# Patient Record
Sex: Female | Born: 1960 | Race: Black or African American | Hispanic: No | Marital: Single | Smoking: Never smoker
Health system: Southern US, Community
[De-identification: ages and names within clinical notes are randomized; demographics above are authoritative.]

## PROBLEM LIST (undated history)

## (undated) DIAGNOSIS — E785 Hyperlipidemia, unspecified: Secondary | ICD-10-CM

## (undated) DIAGNOSIS — Z8669 Personal history of other diseases of the nervous system and sense organs: Secondary | ICD-10-CM

## (undated) DIAGNOSIS — I10 Essential (primary) hypertension: Secondary | ICD-10-CM

## (undated) DIAGNOSIS — K219 Gastro-esophageal reflux disease without esophagitis: Secondary | ICD-10-CM

## (undated) DIAGNOSIS — E119 Type 2 diabetes mellitus without complications: Secondary | ICD-10-CM

## (undated) DIAGNOSIS — G510 Bell's palsy: Secondary | ICD-10-CM

## (undated) HISTORY — PX: ABDOMINAL HYSTERECTOMY: SHX81

## (undated) HISTORY — DX: Type 2 diabetes mellitus without complications: E11.9

## (undated) HISTORY — DX: Gastro-esophageal reflux disease without esophagitis: K21.9

## (undated) HISTORY — DX: Bell's palsy: G51.0

## (undated) HISTORY — DX: Personal history of other diseases of the nervous system and sense organs: Z86.69

## (undated) HISTORY — DX: Hyperlipidemia, unspecified: E78.5

## (undated) HISTORY — DX: Essential (primary) hypertension: I10

## (undated) MED FILL — Medication: Fill #0 | Status: CN

---

## 2003-01-22 HISTORY — PX: OOPHORECTOMY: SHX86

## 2004-04-14 ENCOUNTER — Emergency Department: Payer: Self-pay | Admitting: General Practice

## 2004-04-14 IMAGING — US US PELV - US TRANSVAGINAL
1 series · 17 of 25 positions shown · non-contrast
Comparison: none

REASON FOR EXAM: Abdominal pain. Rm 15
COMMENTS:

[Series 1: us pelv - us transvaginal · 17 of 59 slices shown]
[im 1/59]
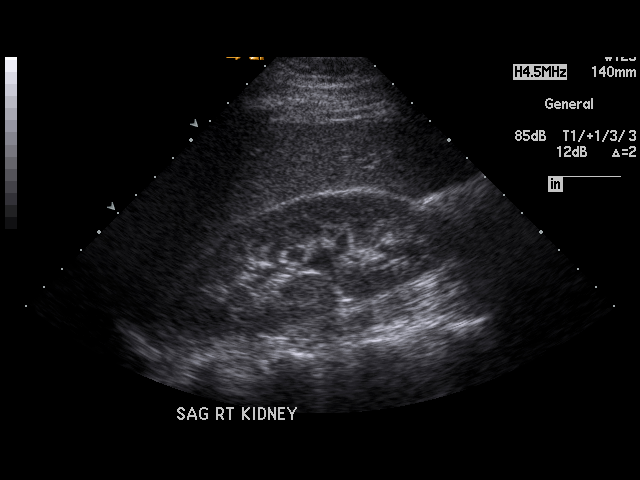
[im 5/59]
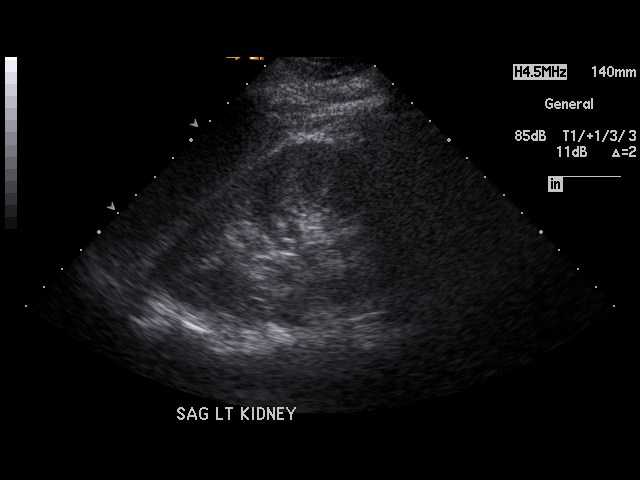
[im 8/59]
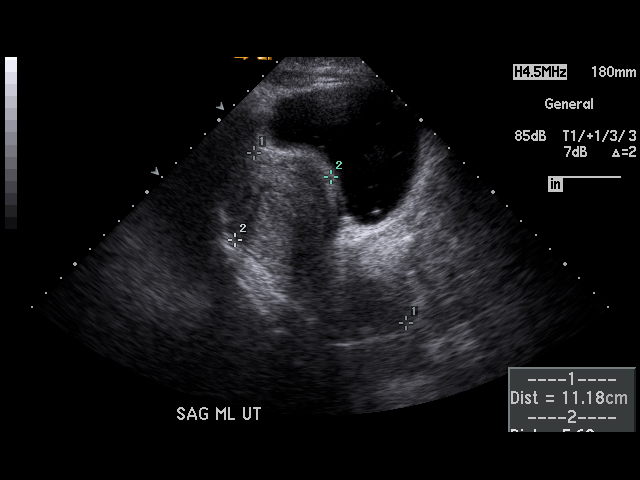
[im 13/59]
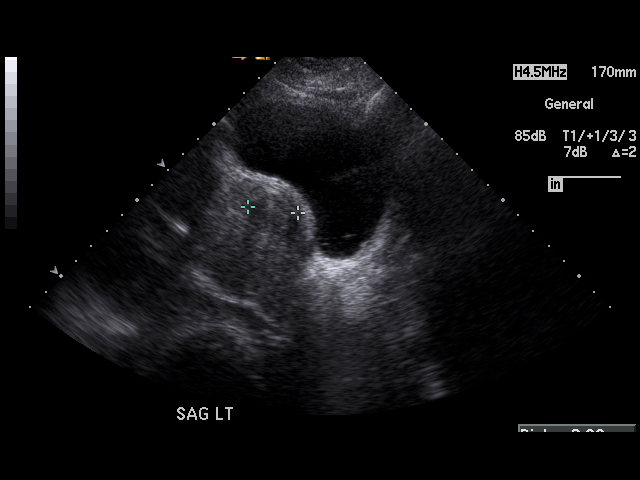
[im 15/59]
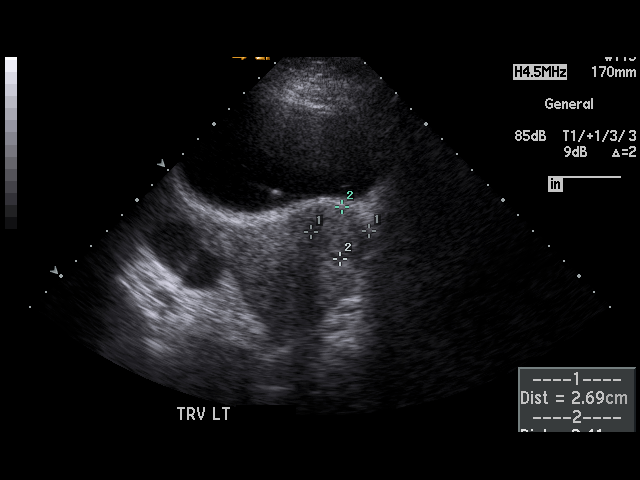
[im 20/59]
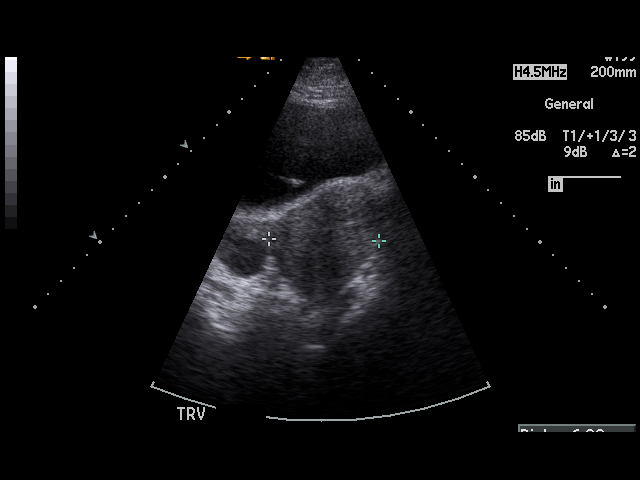
[im 22/59]
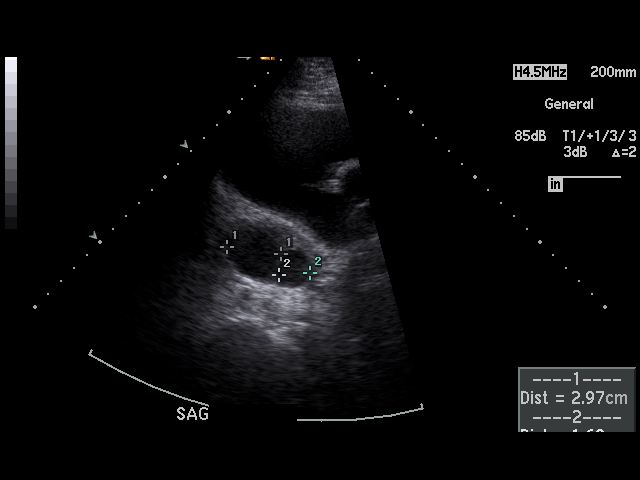
[im 27/59]
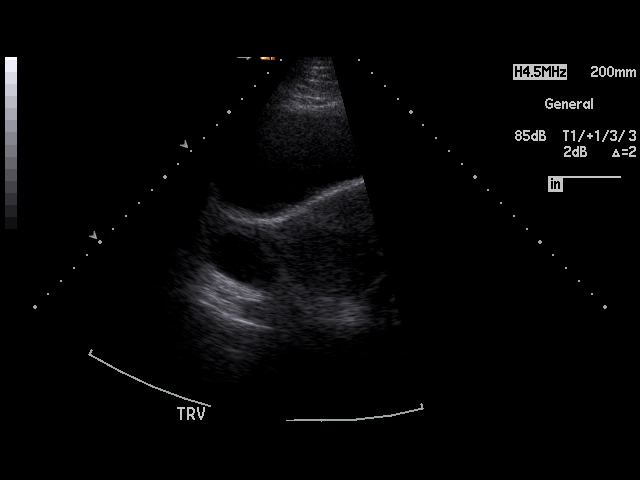
[im 30/59]
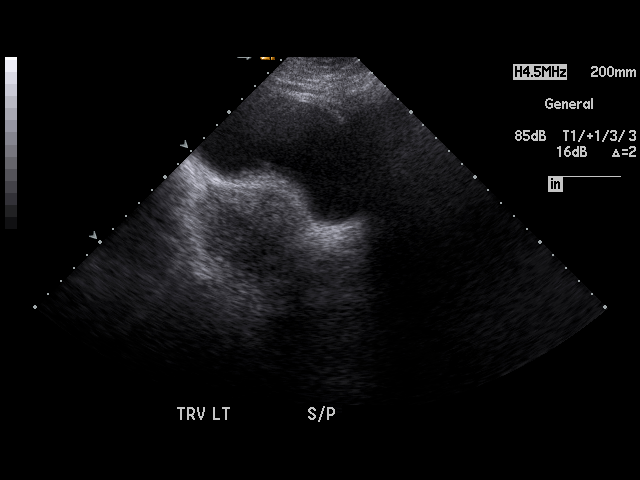
[im 32/59]
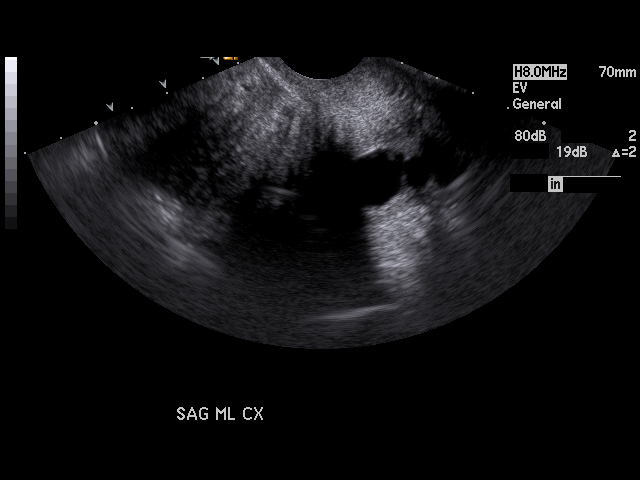
[im 37/59]
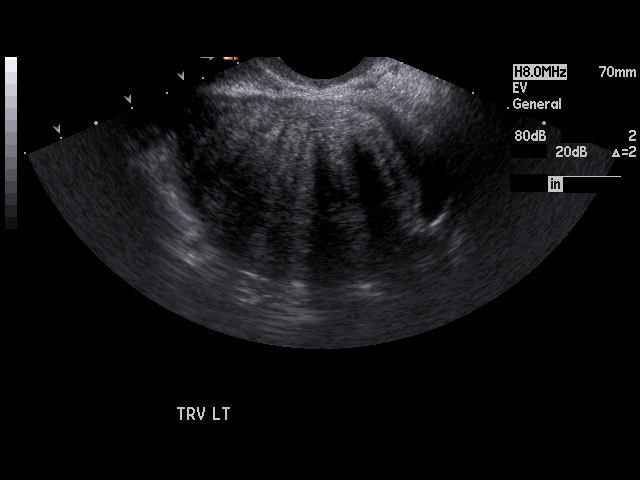
[im 39/59]
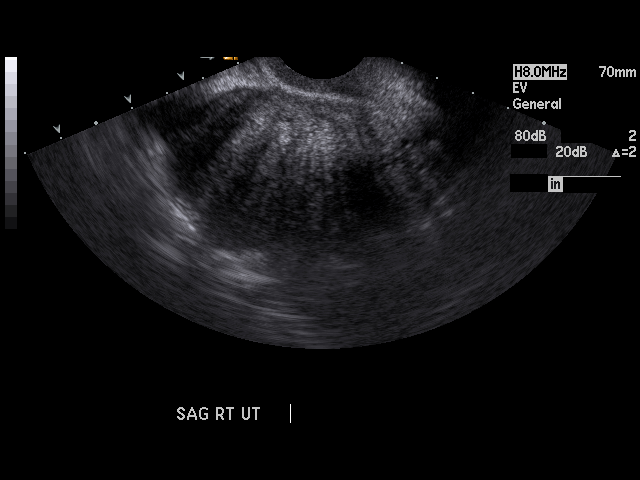
[im 44/59]
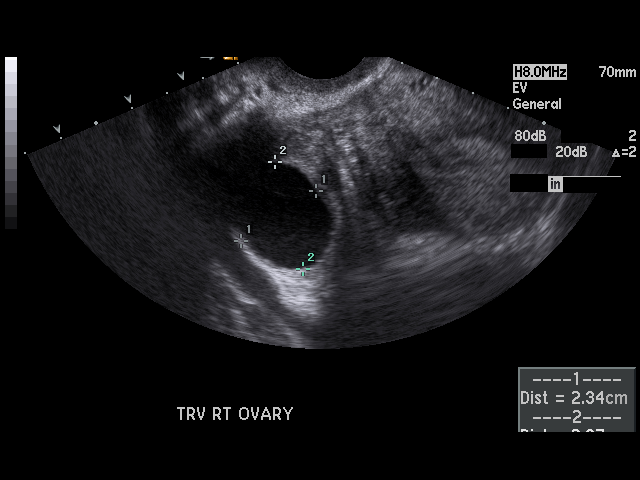
[im 46/59]
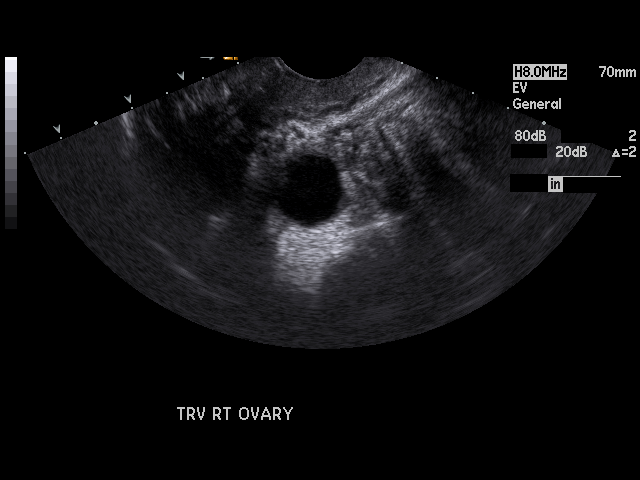
[im 51/59]
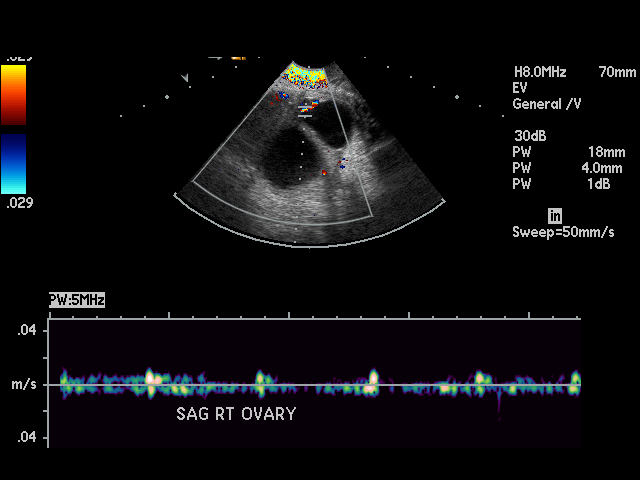
[im 54/59]
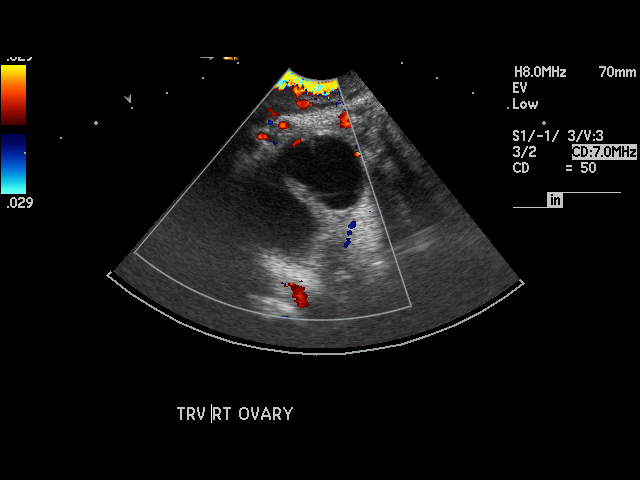
[im 59/59]
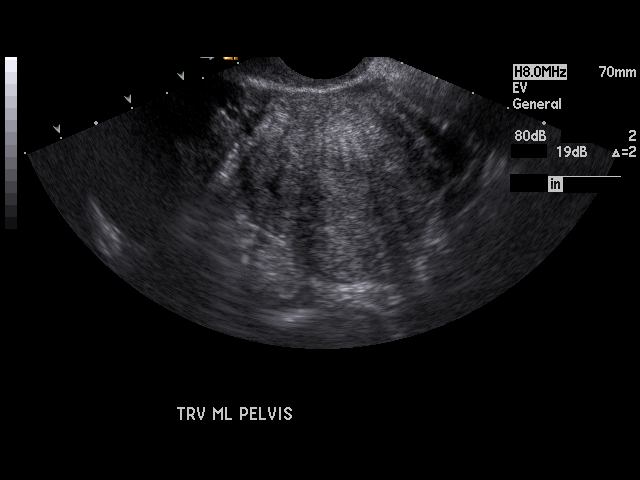

[17 of 25 positions shown; findings below may reference images not displayed]

PROCEDURE:     US  - US PELVIS MASS EXAM  - [DATE] [DATE] [DATE]  [DATE]

RESULT:     Ultrasound evaluation of the pelvis shows normal kidneys.  The
uterus is identified in the midline measuring 11.1 x 5.6 x 6.0 cm with a
fundal fibroid on the LEFT side measuring 2.8 x 2.5 x 2.6 cm.  LEFT ovary
has been surgically removed.  The RIGHT ovary measures 4.3 x 3.1 x 3.5 cm
with follicular cysts.  One measures 2.8 x 2.3 x 3.1 cm and the other
measures 1.9 x 1.7 cm.  No suspicious adnexal mass or free fluid is seen
within the pelvis.
IMPRESSION: 2.8 x 2.5 x 2.6 cm fibroid within the LEFT uterus.

RIGHT ovary contains two follicular cysts.  Follow-up ultrasound in three to
four weeks is recommended to ensure complete resolution.

No suspicious adnexal mass or free fluid within the pelvis.

## 2005-01-04 ENCOUNTER — Emergency Department: Payer: Self-pay | Admitting: Emergency Medicine

## 2005-10-04 ENCOUNTER — Emergency Department: Payer: Self-pay | Admitting: Emergency Medicine

## 2007-06-22 ENCOUNTER — Emergency Department: Payer: Self-pay | Admitting: Emergency Medicine

## 2007-07-21 ENCOUNTER — Emergency Department: Payer: Self-pay | Admitting: Emergency Medicine

## 2008-03-09 ENCOUNTER — Ambulatory Visit: Payer: Self-pay

## 2009-08-22 ENCOUNTER — Ambulatory Visit: Payer: Self-pay

## 2010-04-19 ENCOUNTER — Emergency Department: Payer: Self-pay | Admitting: Emergency Medicine

## 2010-08-08 ENCOUNTER — Emergency Department: Payer: Self-pay | Admitting: Emergency Medicine

## 2010-12-27 LAB — HM DIABETES EYE EXAM

## 2011-01-01 ENCOUNTER — Ambulatory Visit: Payer: Self-pay

## 2012-01-01 ENCOUNTER — Ambulatory Visit: Payer: Self-pay

## 2012-01-01 IMAGING — MG MM DIGITAL SCREENING BILAT W/ CAD
1 series · 5 of 5 positions shown · non-contrast
Comparison: none

REASON FOR EXAM: screening
COMMENTS:

PROCEDURE:     MAM - MAM [REDACTED] DIG SCREEN MAM W/CAD  - [DATE] [DATE]
RESULT:

[R CC · right · 5 of 5 slices shown]
[im 1/5]
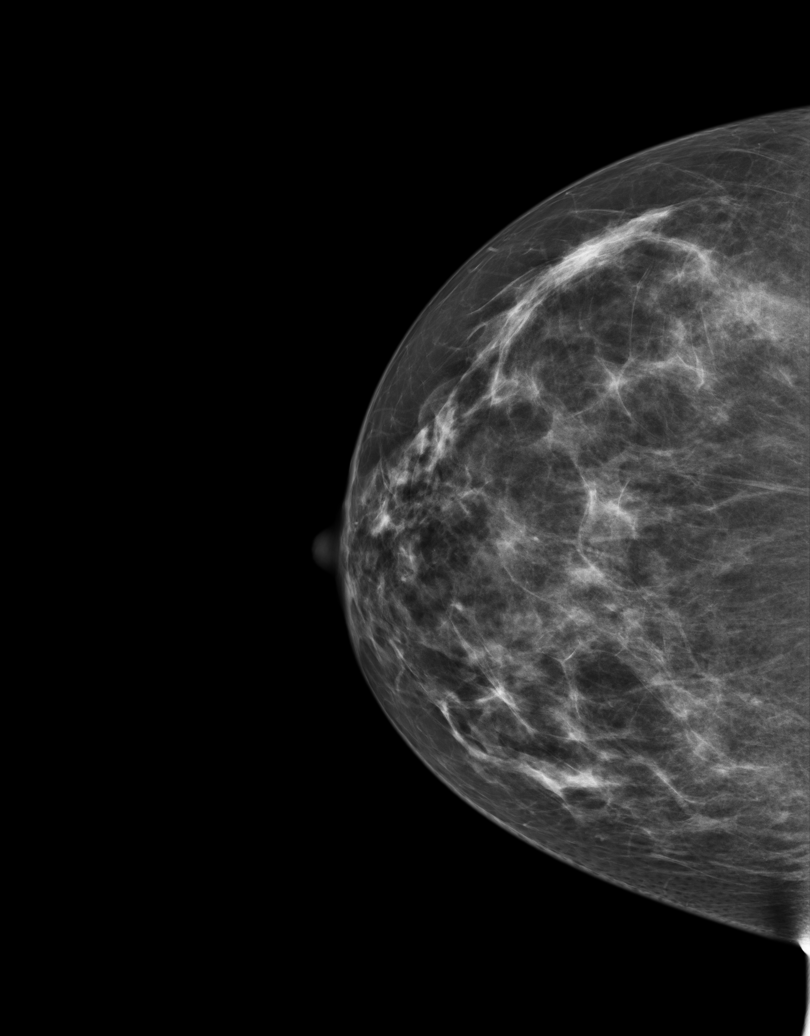
[im 2/5]
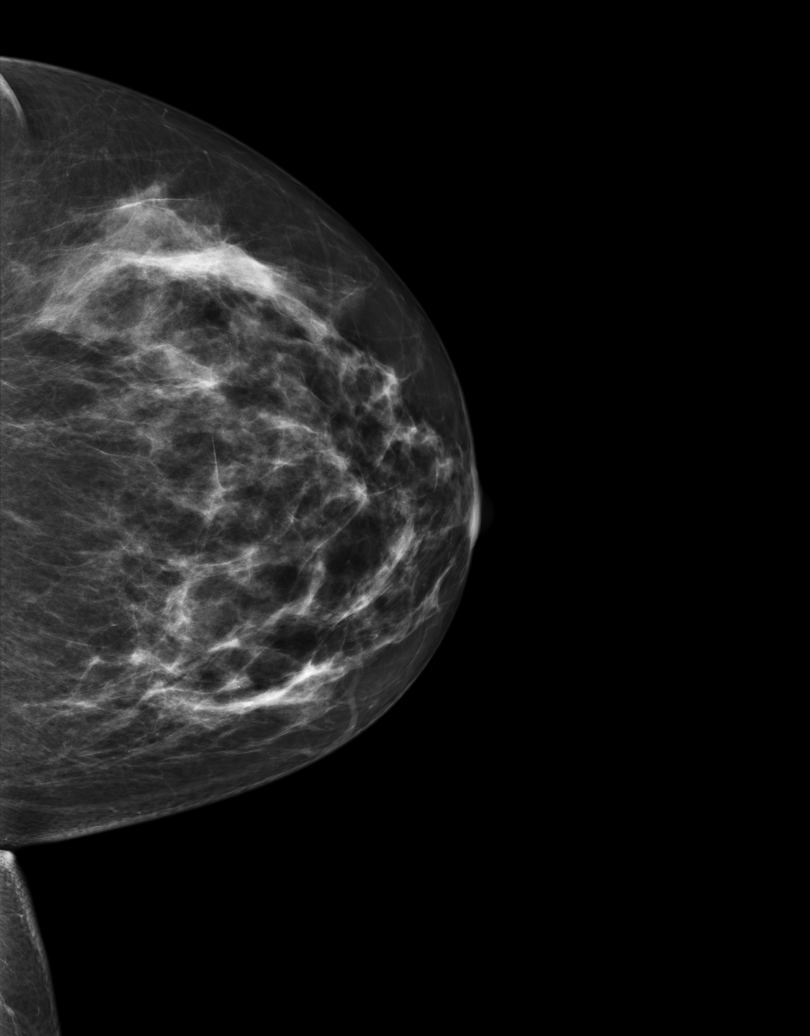
[im 3/5]
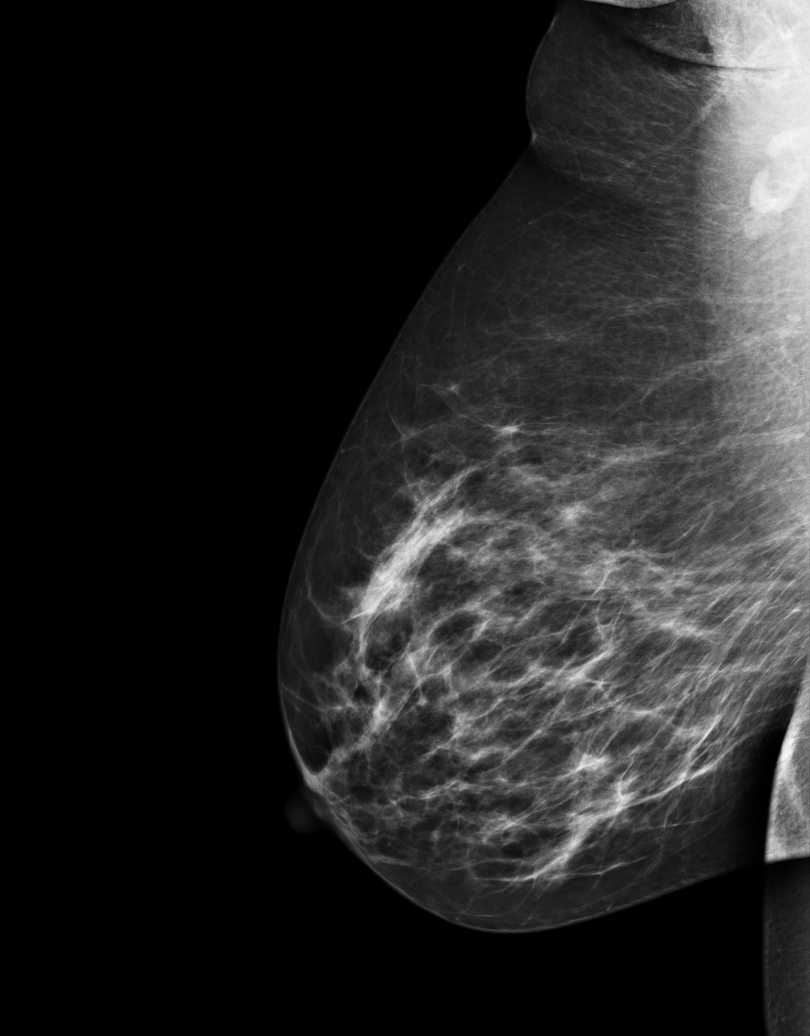
[im 4/5]
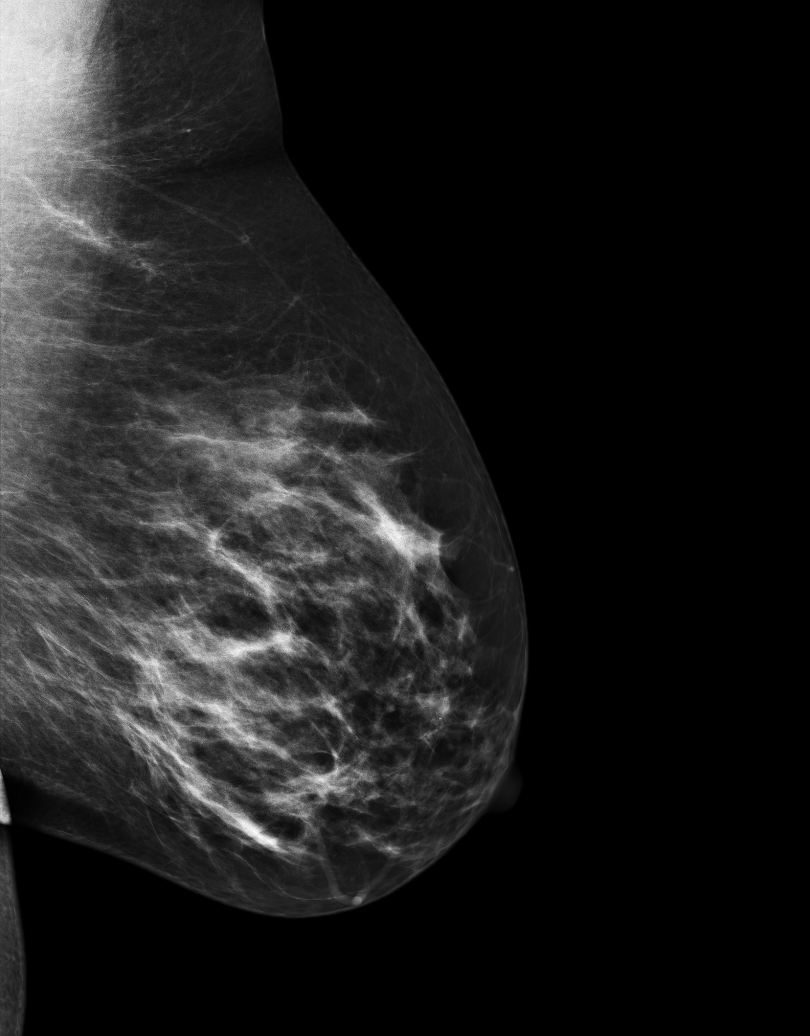
[im 5/5]
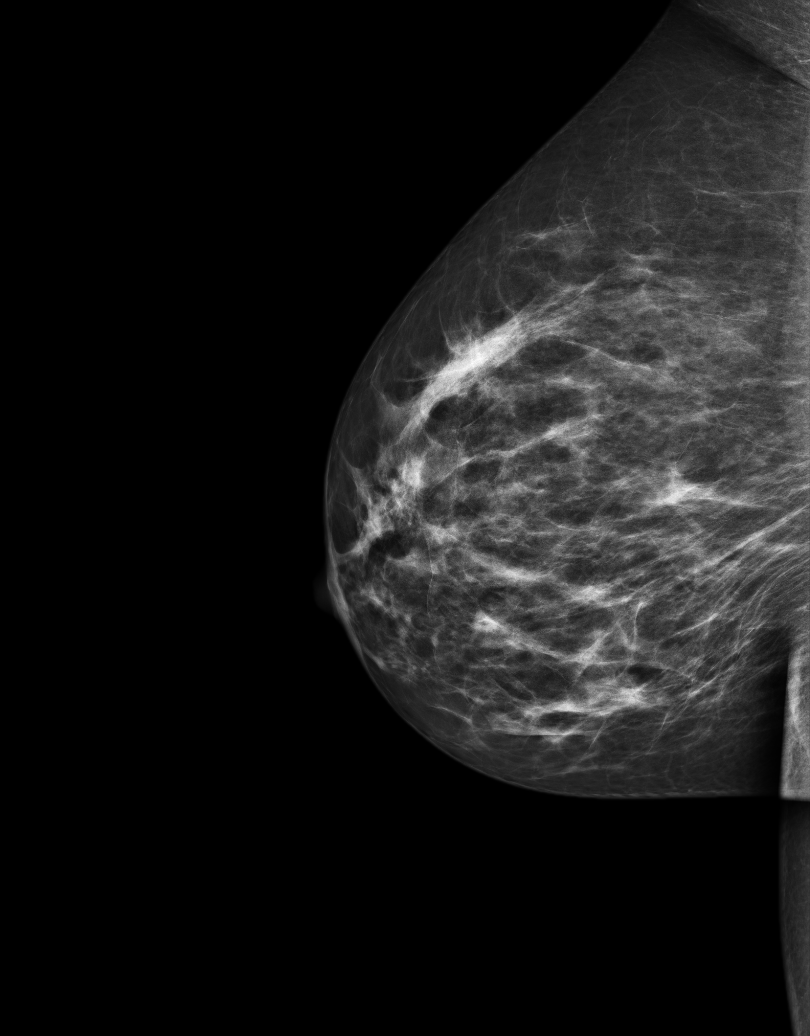

[5 of 5 positions shown; findings below may reference images not displayed]

FINDINGS: The patient has no family or personal history of breast cancer.
The patient has no history or breast surgery.

Comparison is made to previous digital mammographic images [DATE],[DATE] and [DATE].

The breasts exhibit a moderately dense parenchymal pattern without an area
of dominant mass or malignant appearing calcification. There is no
architectural distortion.
IMPRESSION: Benign appearing bilateral mammogram.

RECOMMENDATION:  Please continue to encourage annual mammographic follow-up
and monthly breast self exam.

Breast density: BI-RADS Type III

BI-RADS: Category 2 - Benign Findings

[REDACTED]

Thank you for this opportunity to contribute to the care of your patient.

A NEGATIVE MAMMOGRAM REPORT DOES NOT PRECLUDE BIOPSY OR OTHER EVALUATION OF
A CLINICALLY PALPABLE OR OTHERWISE SUSPICIOUS MASS OR LESION. BREAST CANCER
MAY NOT BE DETECTED BY MAMMOGRAPHY IN UP TO 10% OF CASES.

## 2012-07-15 ENCOUNTER — Emergency Department: Payer: Self-pay | Admitting: Internal Medicine

## 2012-07-15 IMAGING — CR DG LUMBAR SPINE 2-3V
1 series · 4 of 4 positions shown · non-contrast
Comparison: none

REASON FOR EXAM: radicular back pain
COMMENTS:   LMP: Post-Menopausal

[Series 1: ap · 0.17mm/px · 4 of 4 slices shown]
[im 1/4]
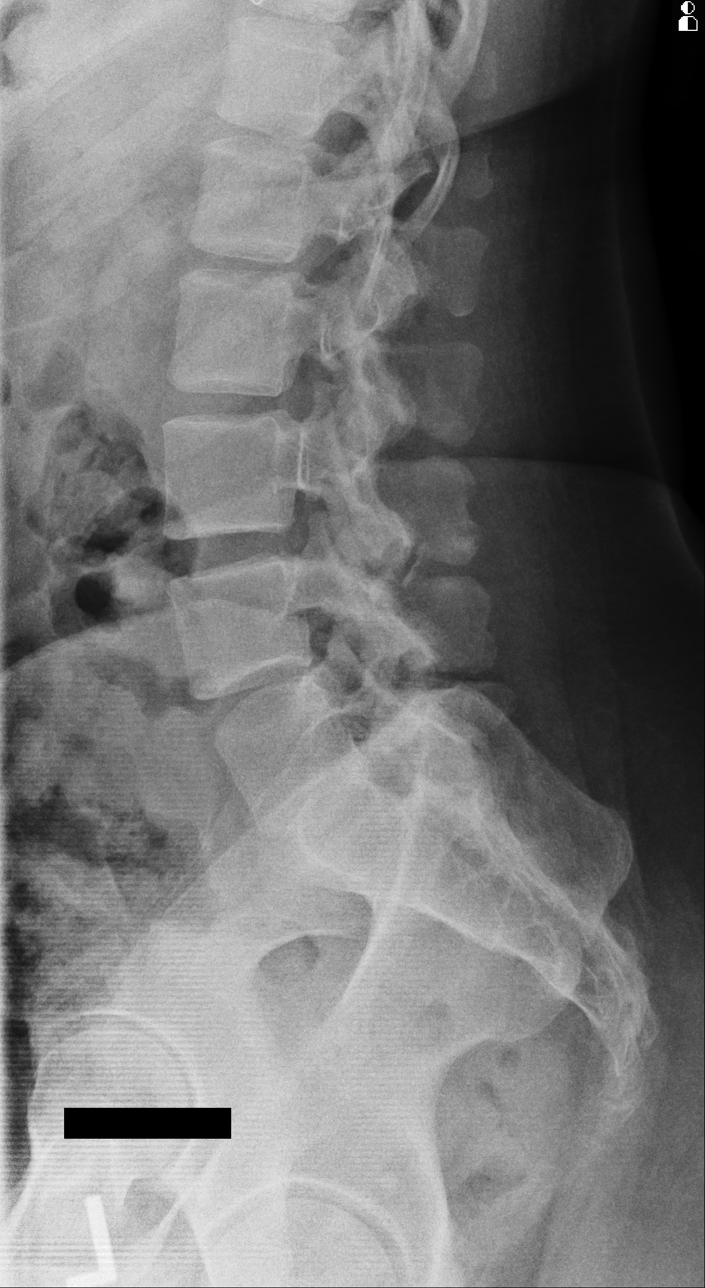
[im 2/4]
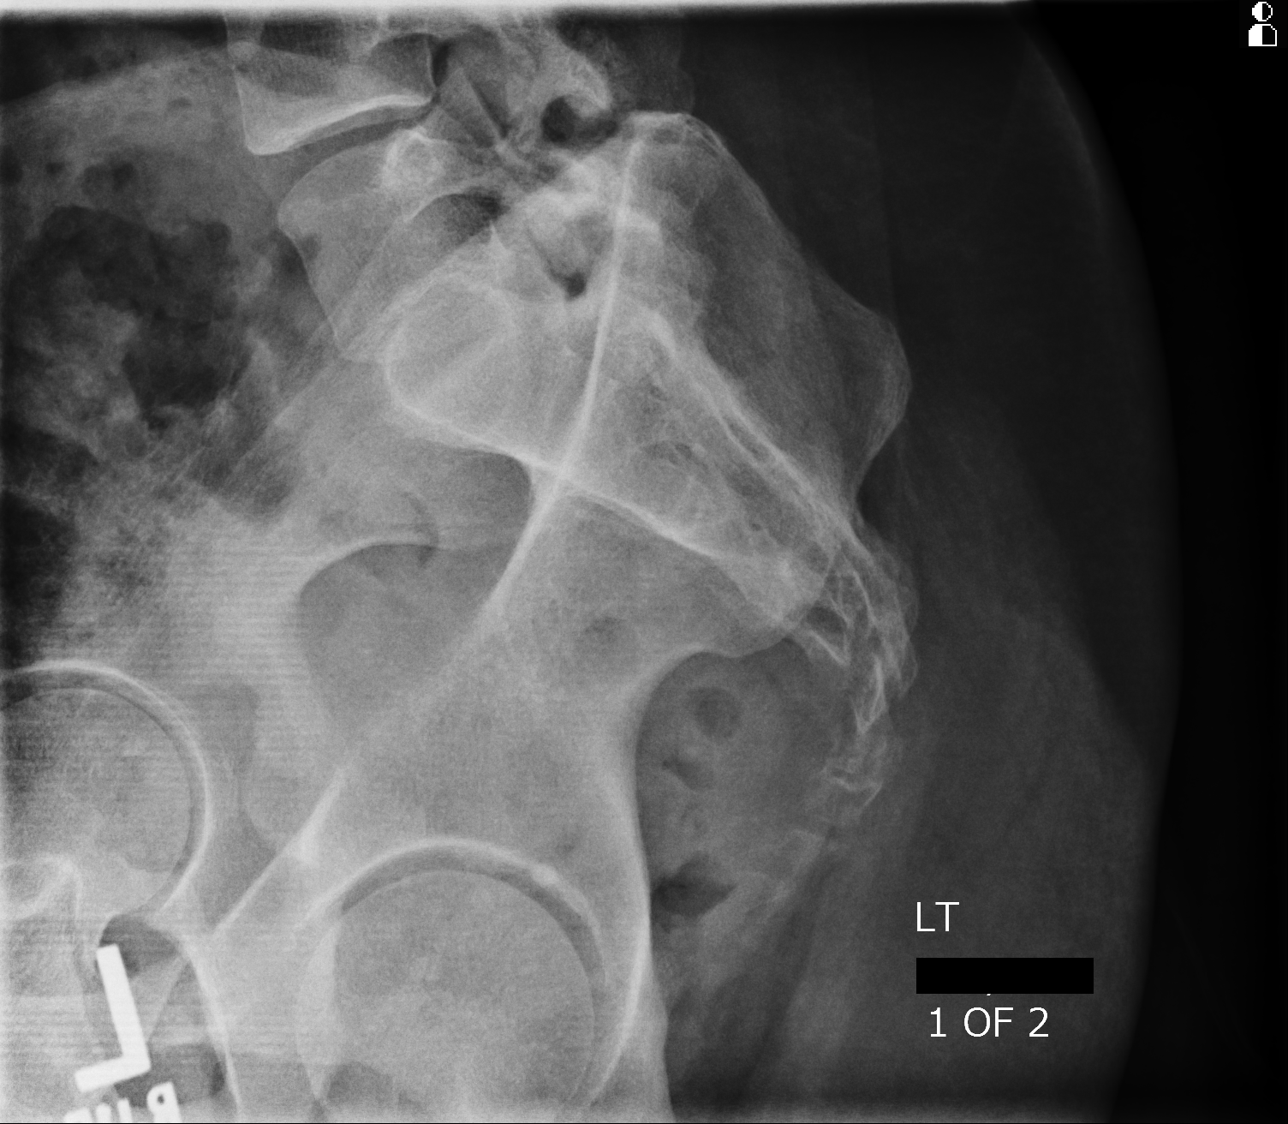
[im 3/4]
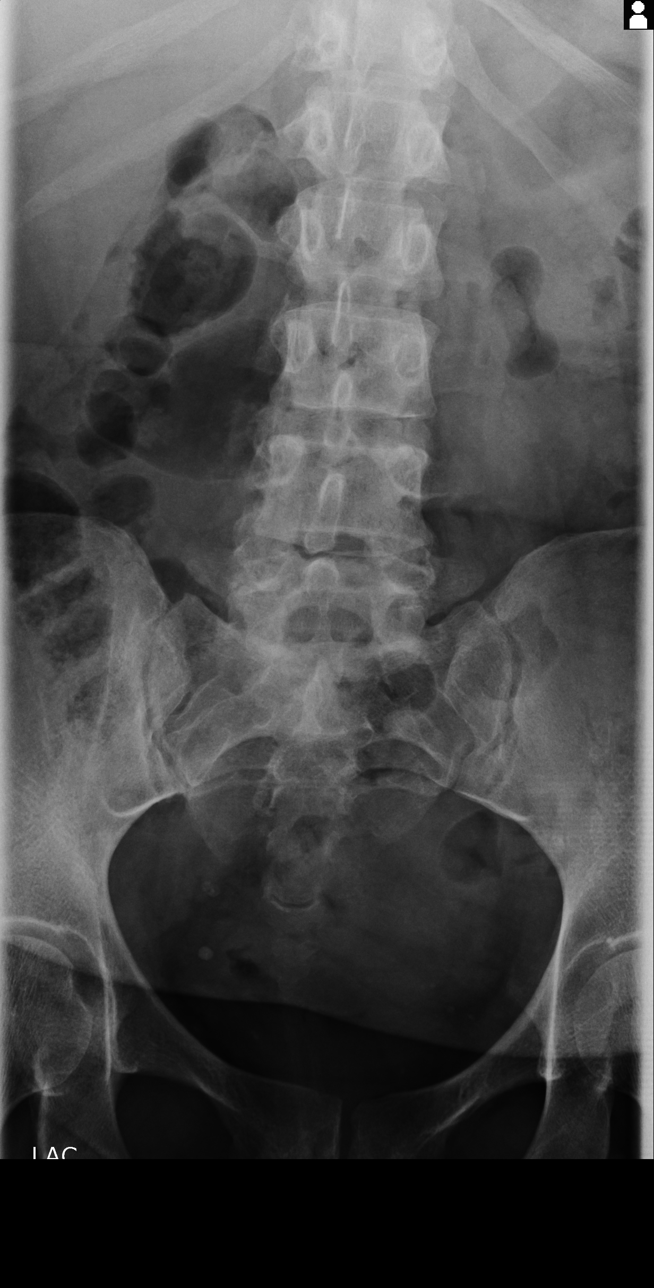
[im 4/4]
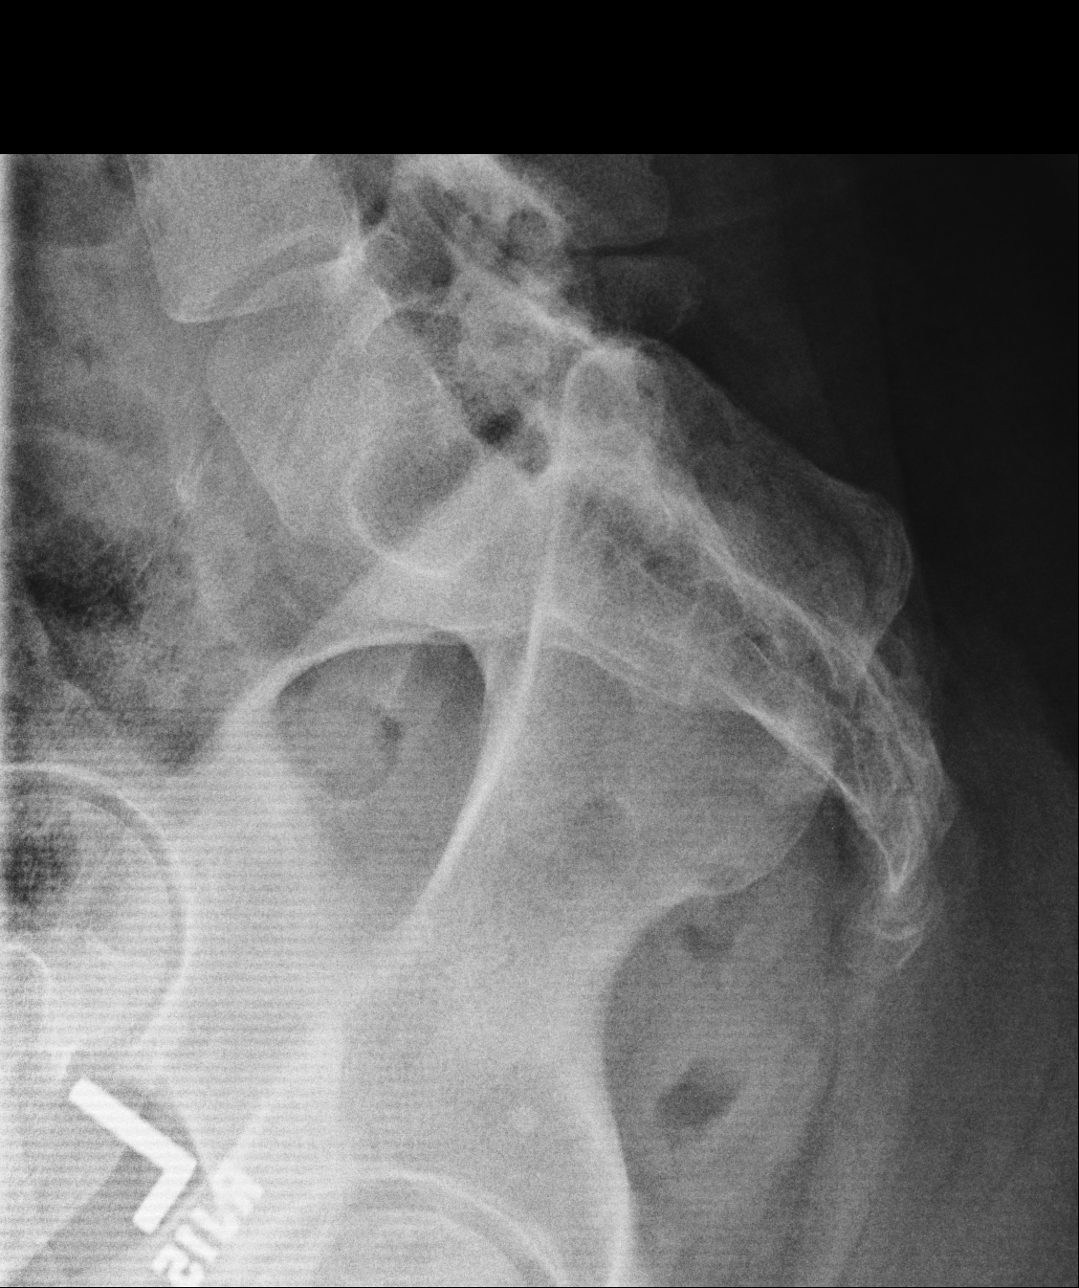

[4 of 4 positions shown; findings below may reference images not displayed]

PROCEDURE:     DXR - DXR LUMBAR SPINE AP AND LATERAL  - [DATE]  [DATE]

RESULT:     The lumbar vertebral bodies are preserved in height. The
intervertebral disc space heights are reasonably well maintained though mild
narrowing at L4-L5 is suspected. There is no spondylolisthesis. The pedicles
and transverse processes appear normal where visualized. The observed
portions of the sacrum are normal.
IMPRESSION: There is no evidence of a compression fracture. There is
mild disc space narrowing at L4-L5. If the patient's symptoms persist and
remain radicular, further evaluation with MRI may be of value.

[REDACTED]

## 2012-07-15 IMAGING — US US EXTREM LOW VENOUS*R*
1 series · 14 of 24 positions shown · non-contrast
Comparison: none

REASON FOR EXAM: right calf edema and pain
COMMENTS:   LMP: Post-Menopausal

PROCEDURE:     US  - US DOPPLER LOW EXTR RIGHT  - [DATE]  [DATE]
RESULT:     Technique: Gray scale, Duplex color  flow doppler, and spectral
waveform imaging was performed of the deep venous structures of the RIGHT
lower extremity.

[Series 1: us extrem low venous*right* · 0.09mm/px · 14 of 24 slices shown]
[im 1/24]
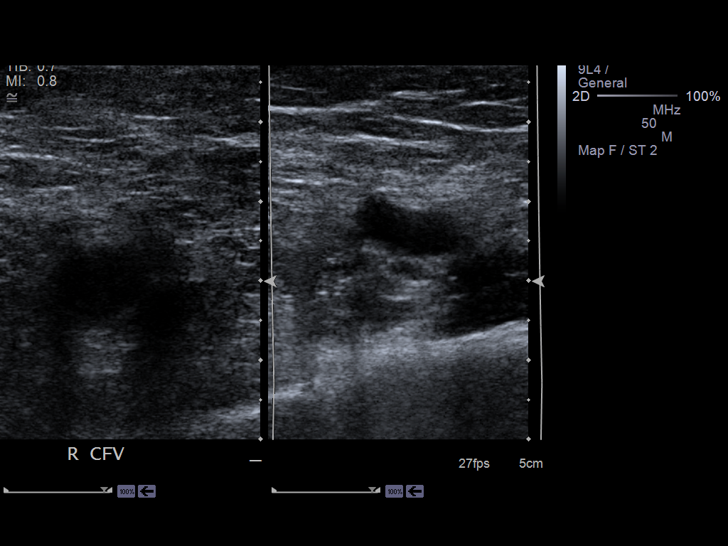
[im 3/24]
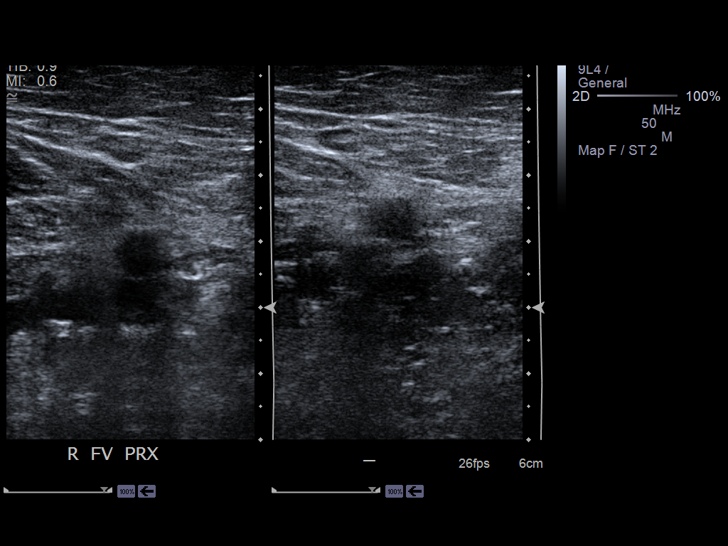
[im 5/24]
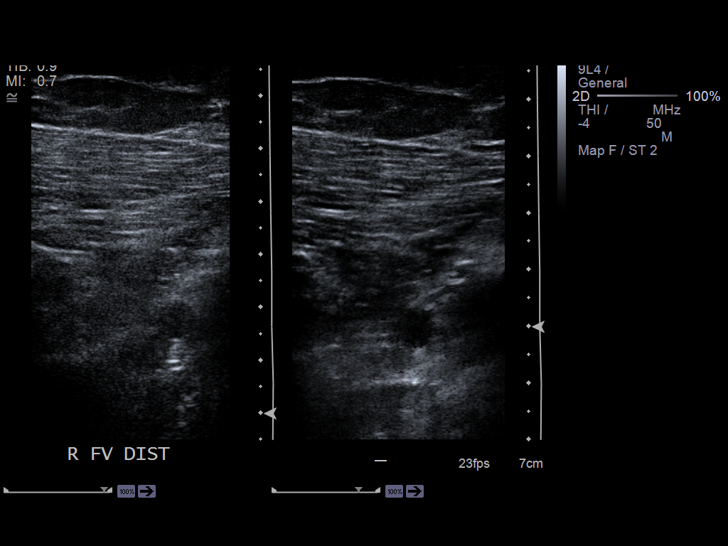
[im 7/24]
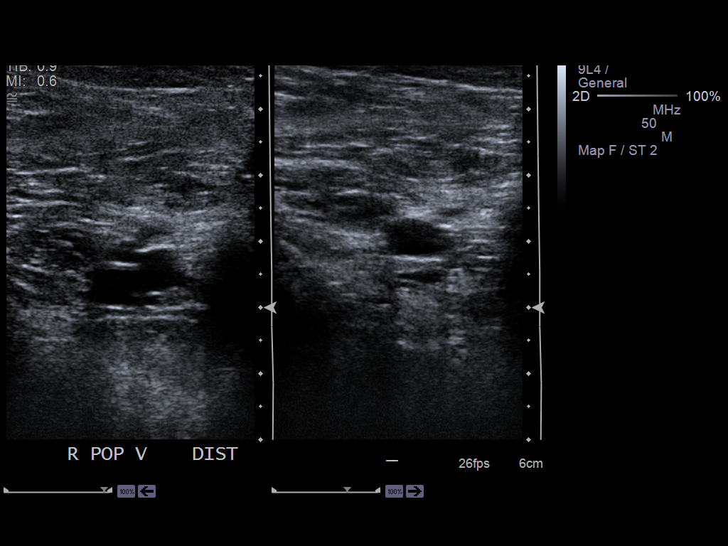
[im 8/24]
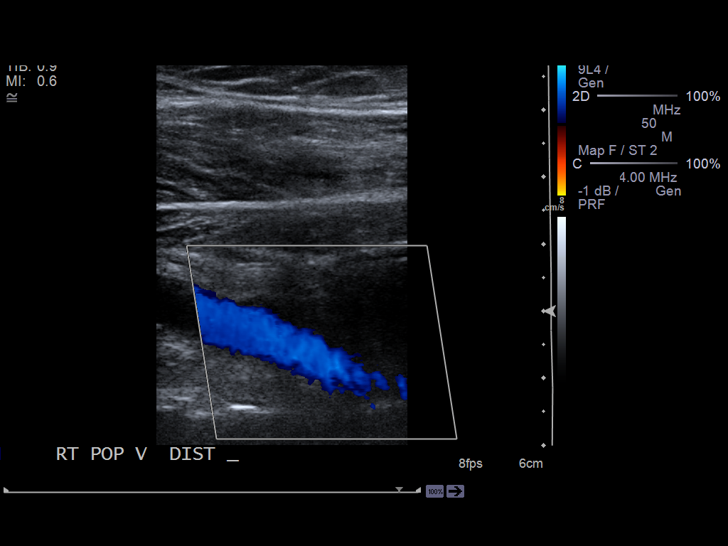
[im 10/24]
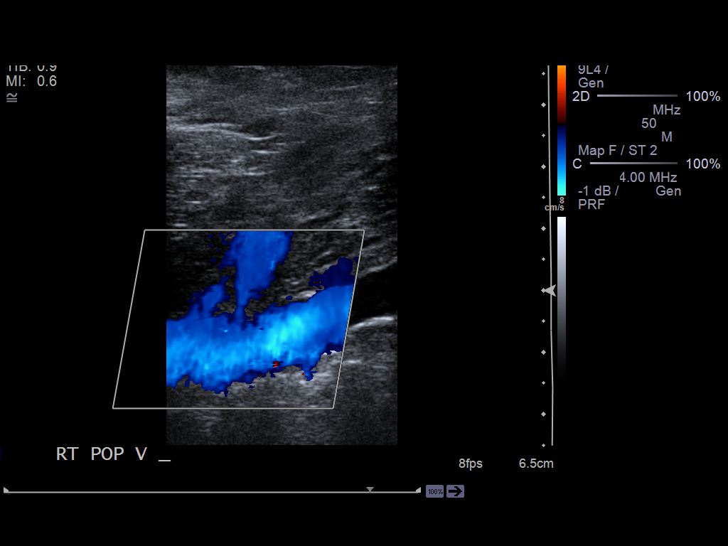
[im 12/24]
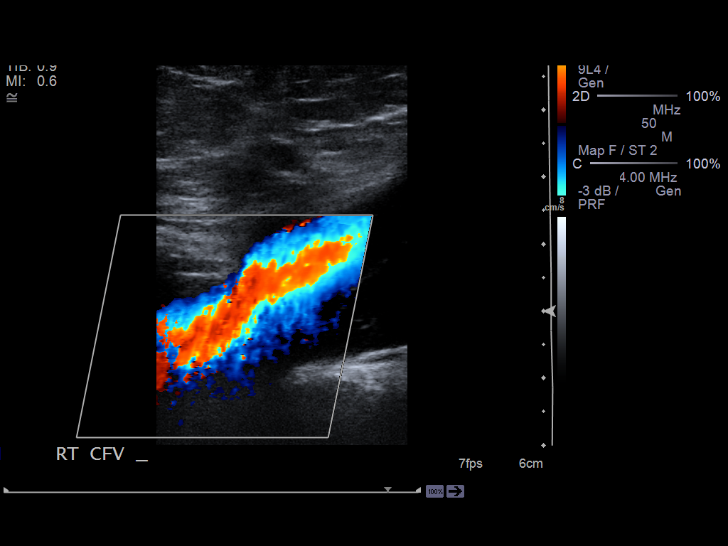
[im 13/24]
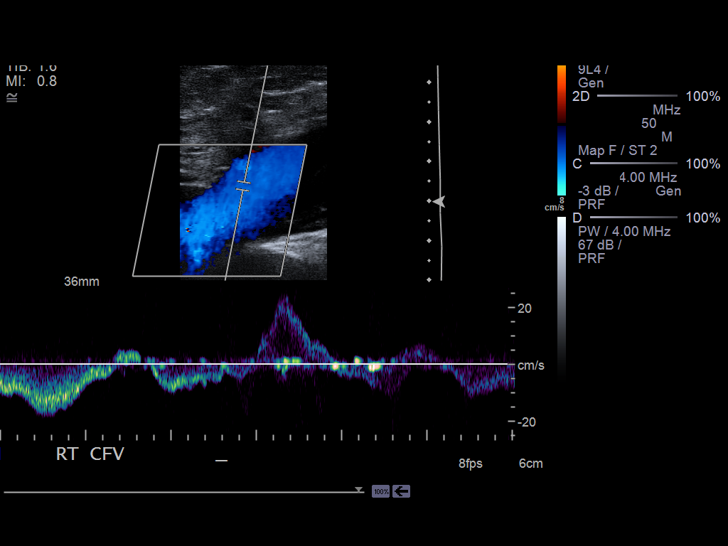
[im 15/24]
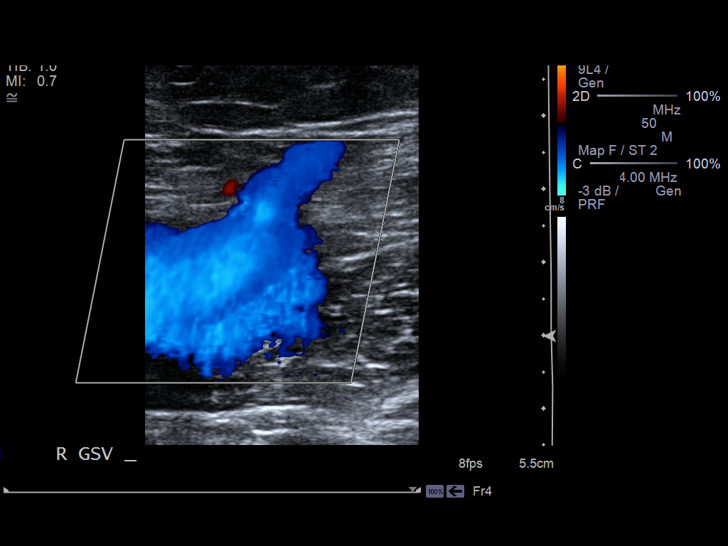
[im 17/24]
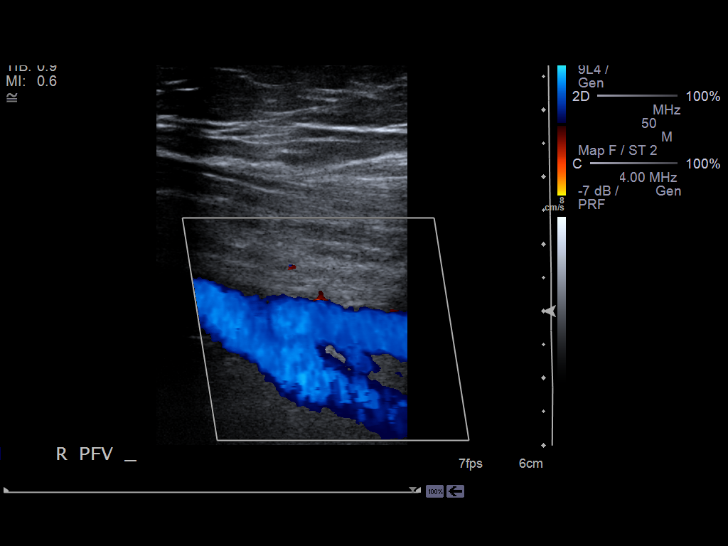
[im 19/24]
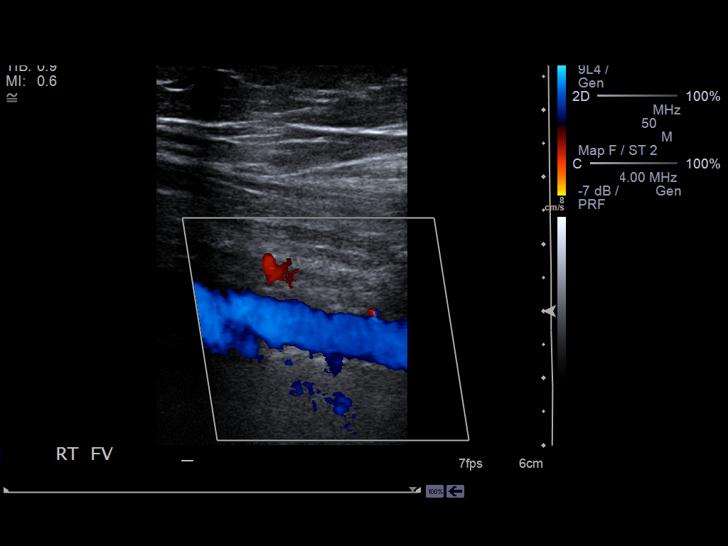
[im 20/24]
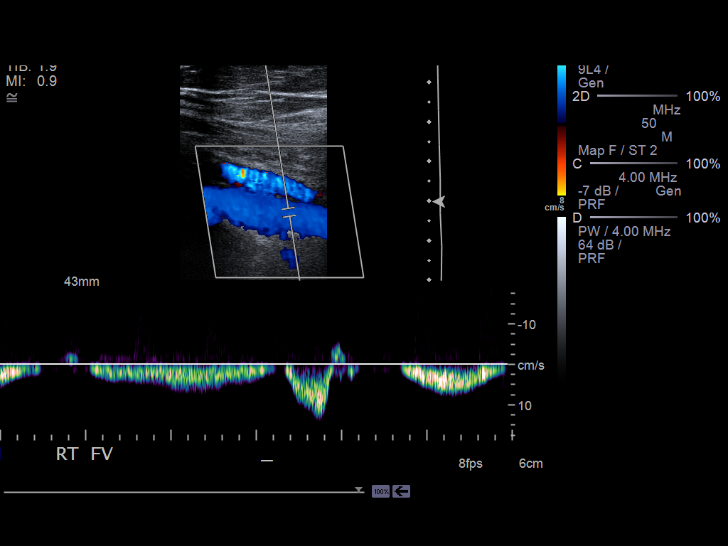
[im 22/24]
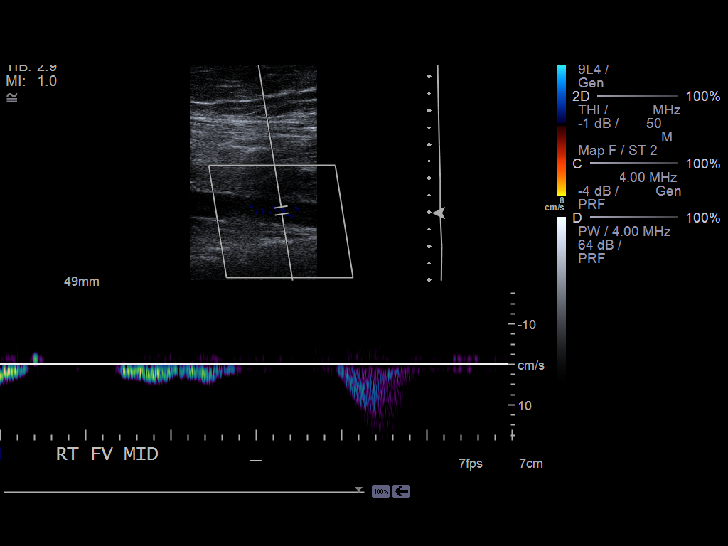
[im 24/24]
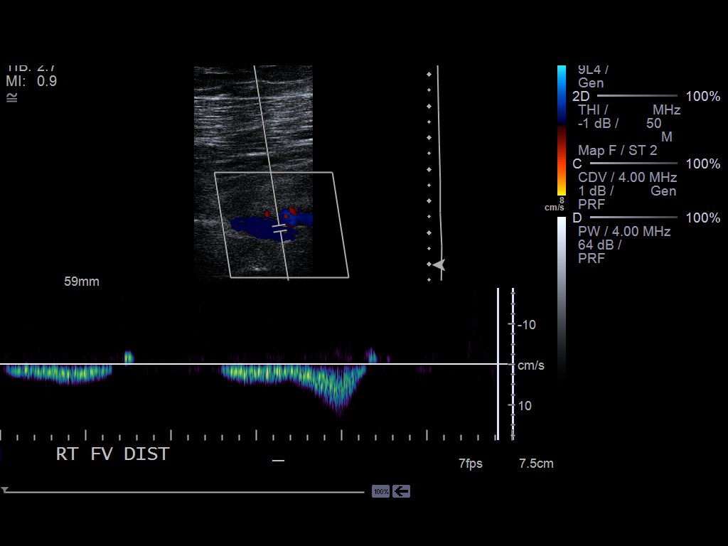

[14 of 24 positions shown; findings below may reference images not displayed]

FINDINGS: There is no evidence of increased echogenicity,
non-compressibility, abnormal waveform, abnormal grayscale, nor abnormal
color flow with in the interrogated deep venous structures of the RIGHT
lower extremity. There is appropriate response to Valsalva and augmentation
within the interrogated vessels.
IMPRESSION: 1. No sonographic evidence of a deep venous thrombus within the interrogated
vessels of the RIGHT lower extremity.

## 2012-10-19 ENCOUNTER — Emergency Department: Payer: Self-pay | Admitting: Emergency Medicine

## 2012-10-19 LAB — CBC WITH DIFFERENTIAL/PLATELET
Basophil #: 0.1 10*3/uL (ref 0.0–0.1)
Basophil %: 0.9 %
Eosinophil #: 0.1 10*3/uL (ref 0.0–0.7)
Lymphocyte #: 3.2 10*3/uL (ref 1.0–3.6)
MCH: 26.2 pg (ref 26.0–34.0)
MCV: 79 fL — ABNORMAL LOW (ref 80–100)
Platelet: 352 10*3/uL (ref 150–440)
WBC: 8 10*3/uL (ref 3.6–11.0)

## 2012-10-19 LAB — COMPREHENSIVE METABOLIC PANEL
Anion Gap: 4 — ABNORMAL LOW (ref 7–16)
Bilirubin,Total: 0.3 mg/dL (ref 0.2–1.0)
Creatinine: 0.84 mg/dL (ref 0.60–1.30)
EGFR (African American): >60
Glucose: 170 mg/dL — ABNORMAL HIGH (ref 65–99)
Osmolality: 278 (ref 275–301)
Potassium: 4 mmol/L (ref 3.5–5.1)
SGOT(AST): 24 U/L (ref 15–37)
Sodium: 138 mmol/L (ref 136–145)
Total Protein: 8.6 g/dL — ABNORMAL HIGH (ref 6.4–8.2)

## 2012-10-19 IMAGING — US US EXTREM LOW VENOUS*R*
1 series · 14 of 24 positions shown · non-contrast
Comparison: none

REASON FOR EXAM: Painful and warm to touch
COMMENTS:

[Series 1: us extrem low venous*right* · 14 of 28 slices shown]
[im 1/28]
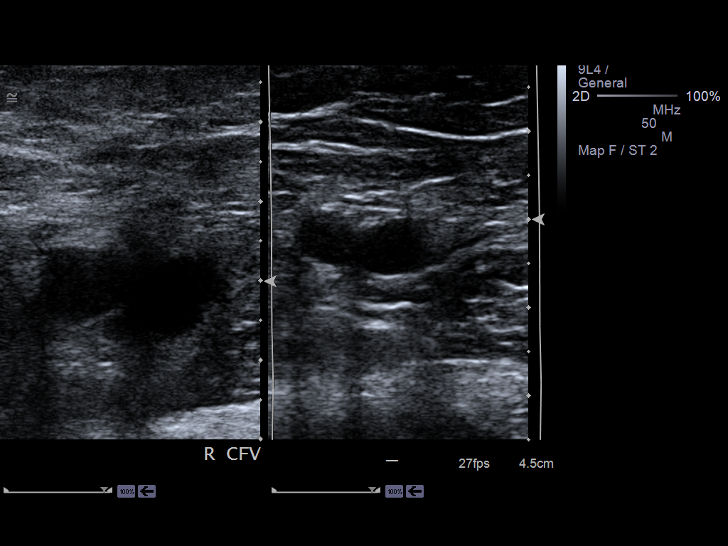
[im 3/28]
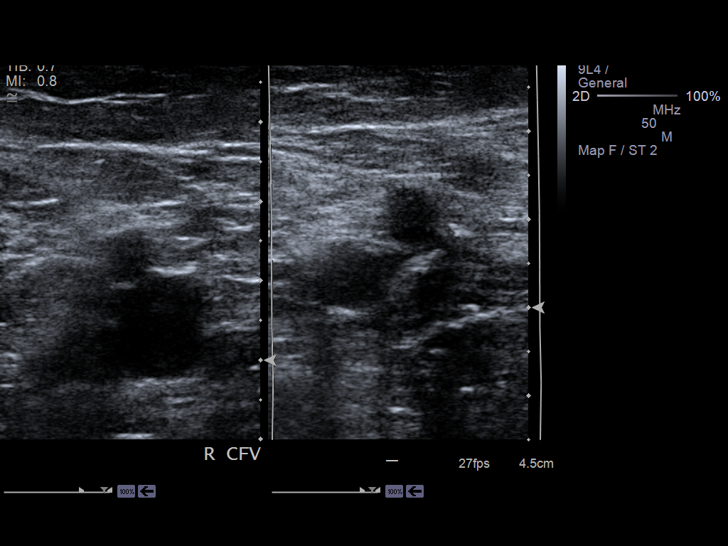
[im 5/28]
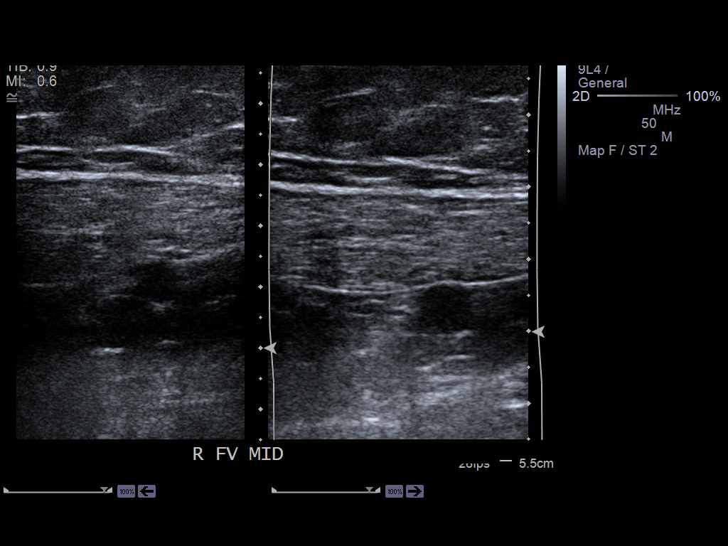
[im 8/28]
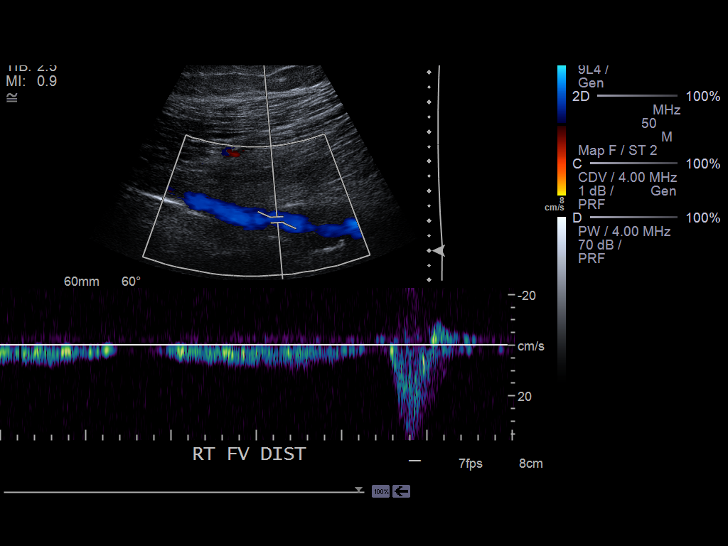
[im 9/28]
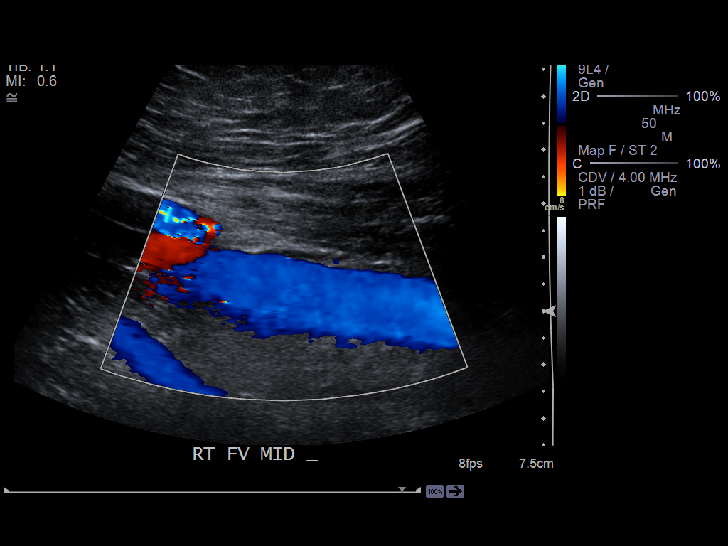
[im 11/28]
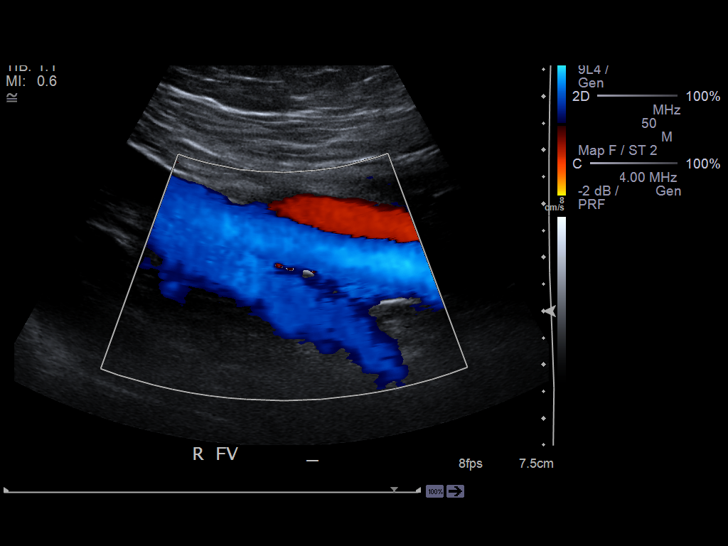
[im 13/28]
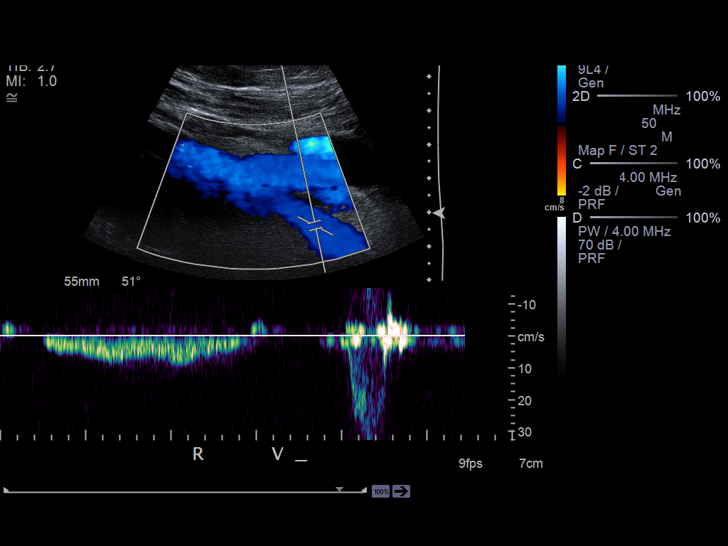
[im 15/28]
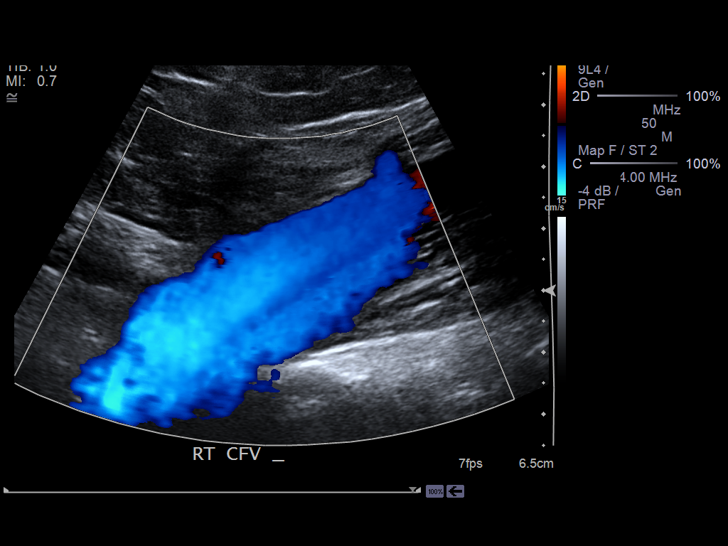
[im 17/28]
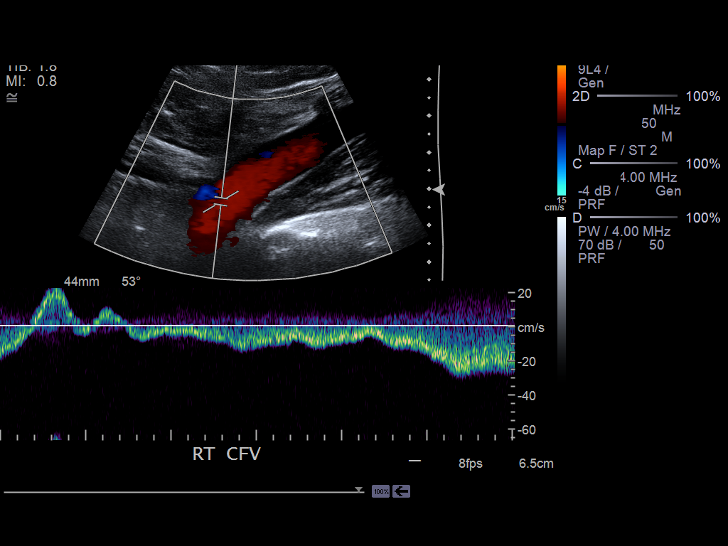
[im 19/28]
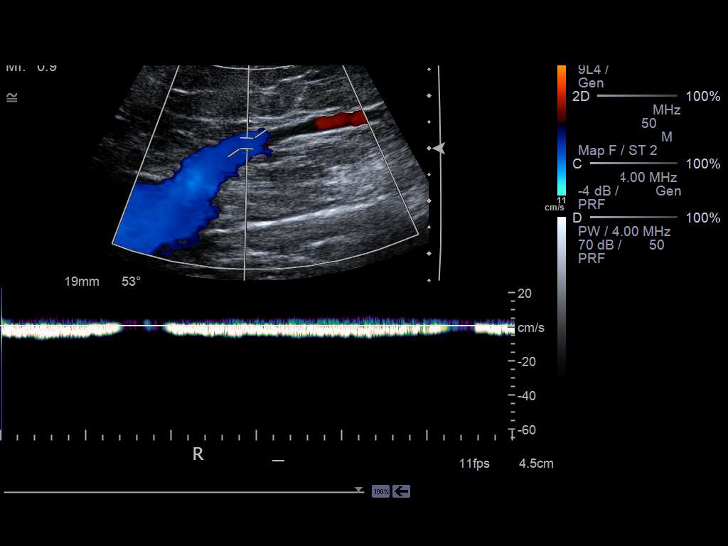
[im 22/28]
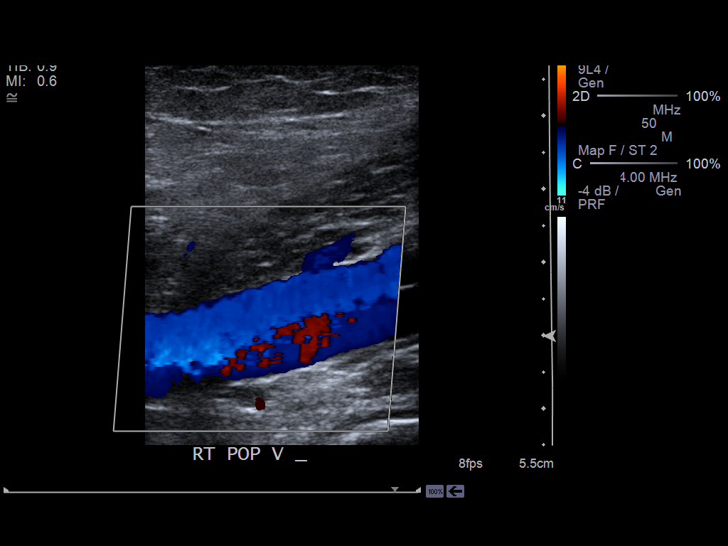
[im 23/28]
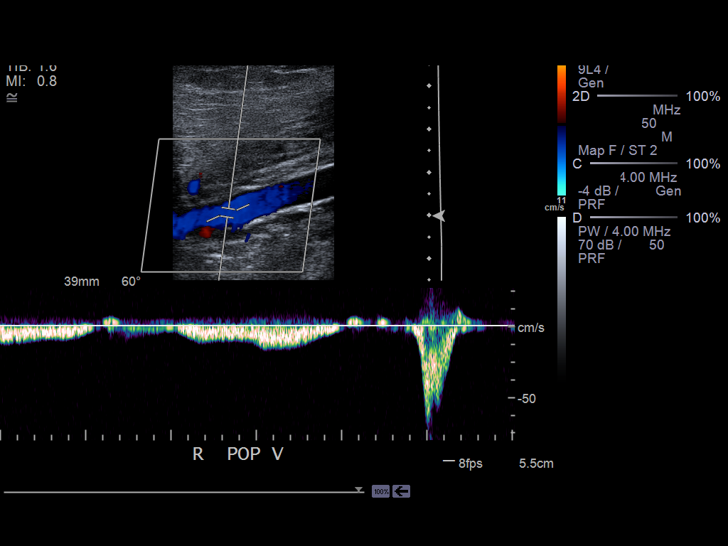
[im 25/28]
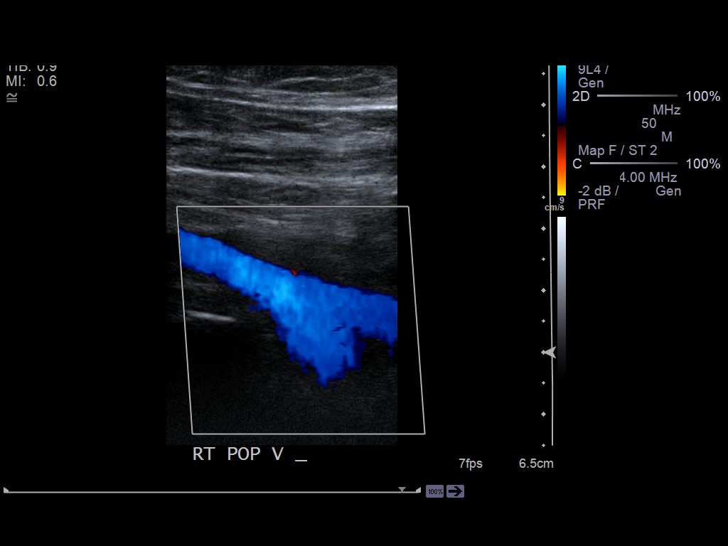
[im 28/28]
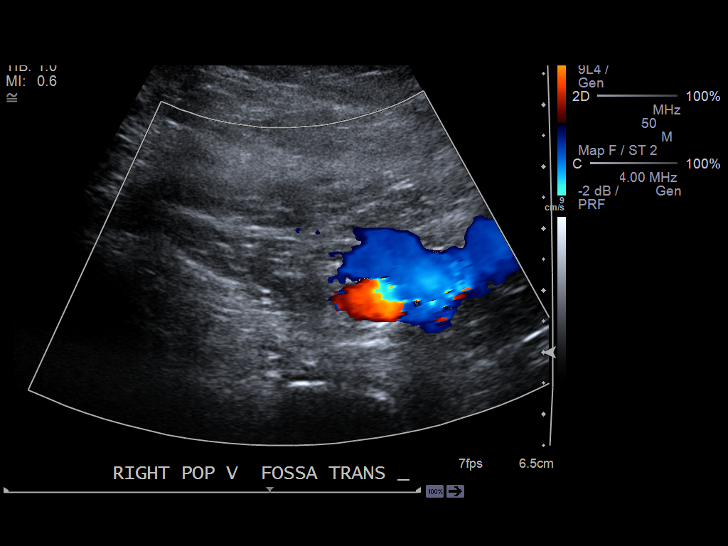

[14 of 24 positions shown; findings below may reference images not displayed]

PROCEDURE:     US  - US DOPPLER LOW EXTR RIGHT  - [DATE] [DATE]

RESULT:     Grayscale and color flow Doppler techniques were employed to
evaluate the deep veins of the right lower extremity.

The right common femoral, superficial femoral, and popliteal veins are
normally compressible. The waveform patterns are normal and the color flow
images are normal. The response to the augmentation and Valsalva maneuvers
is normal.
IMPRESSION: There is no evidence of thrombus within the right femoral
or popliteal veins.

[REDACTED]

## 2012-12-23 ENCOUNTER — Ambulatory Visit: Payer: Self-pay

## 2012-12-23 IMAGING — MG MM DIGITAL SCREENING BILAT W/ CAD
1 series · 4 of 4 positions shown · non-contrast
Comparison: Previous exam(s).

CLINICAL DATA: Screening.

EXAM:
DIGITAL SCREENING BILATERAL MAMMOGRAM WITH CAD

[R CC · right · 4 of 4 slices shown]
[im 1/4]
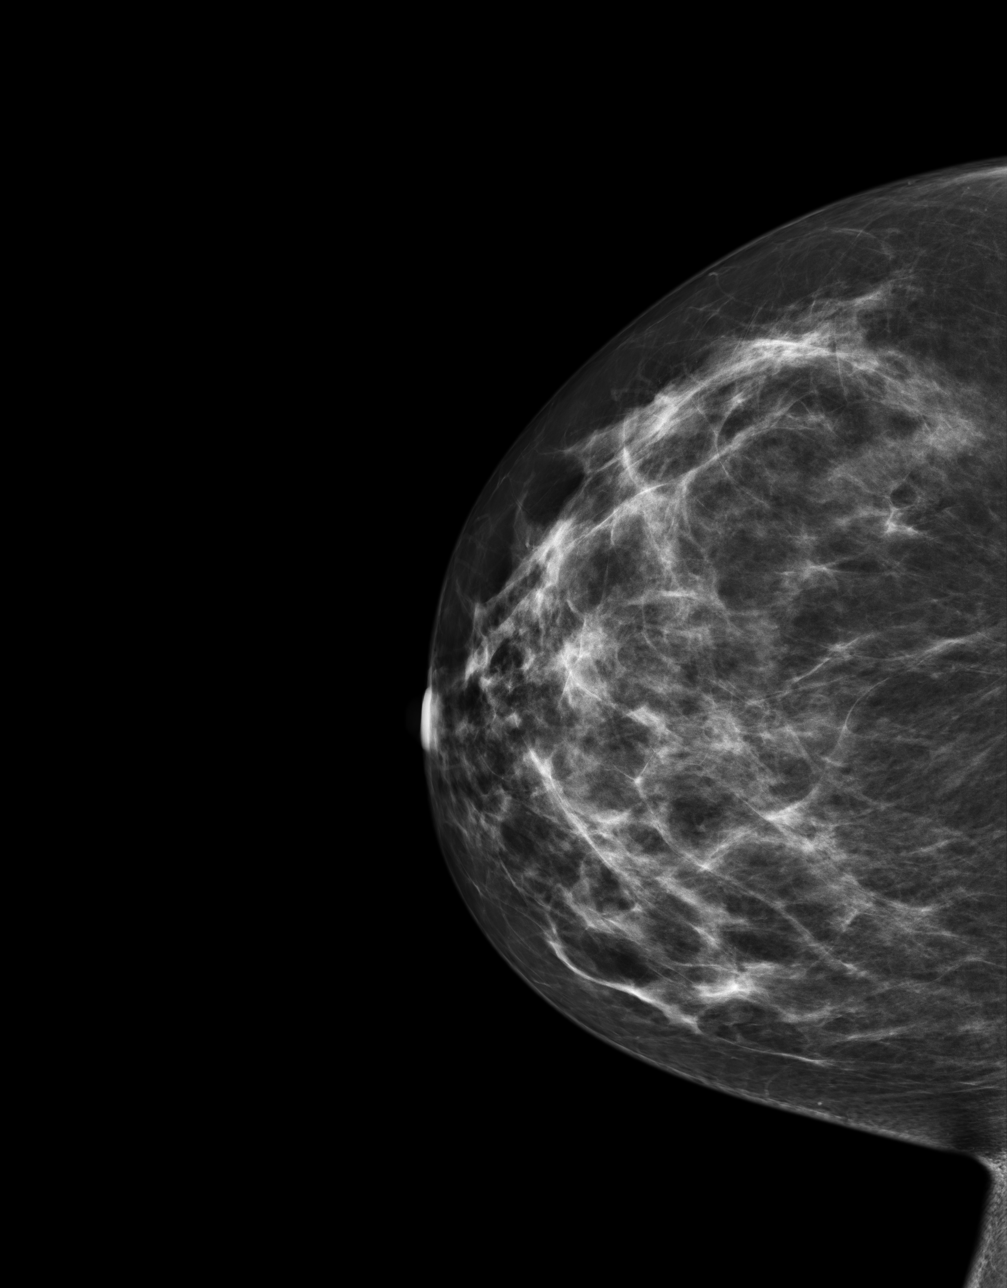
[im 2/4]
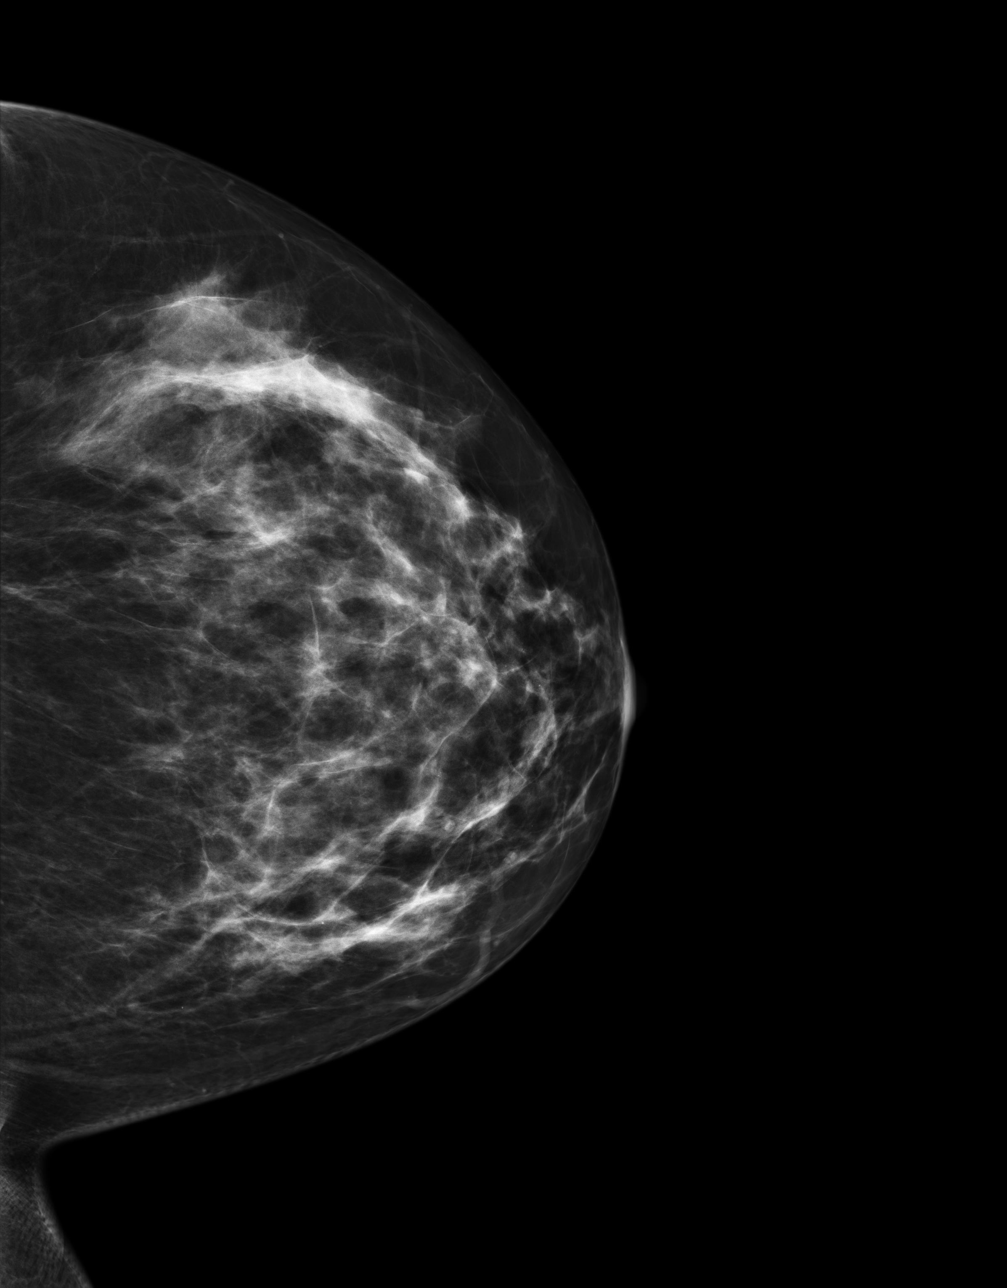
[im 3/4]
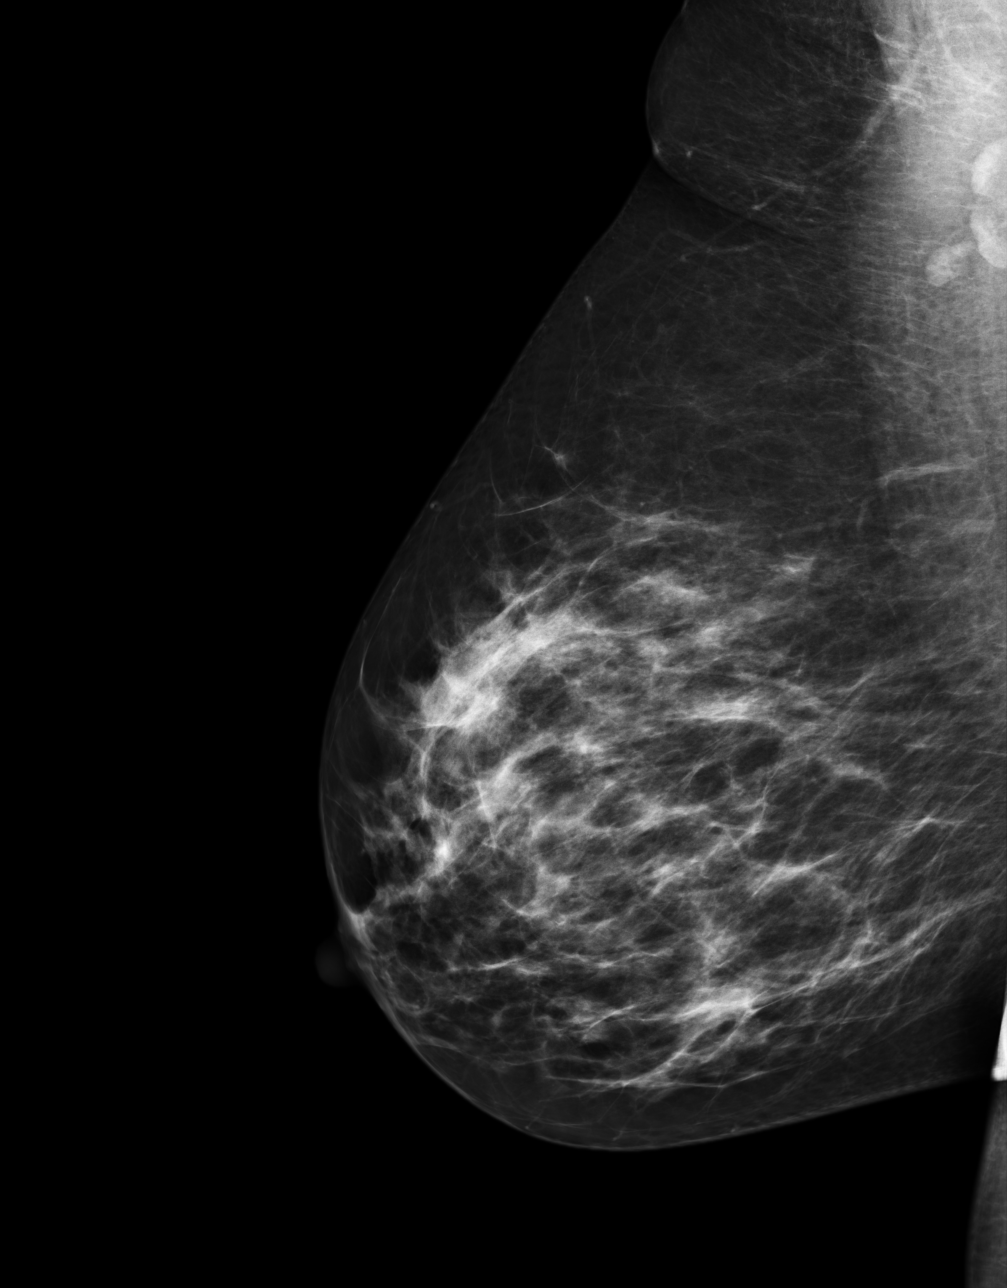
[im 4/4]
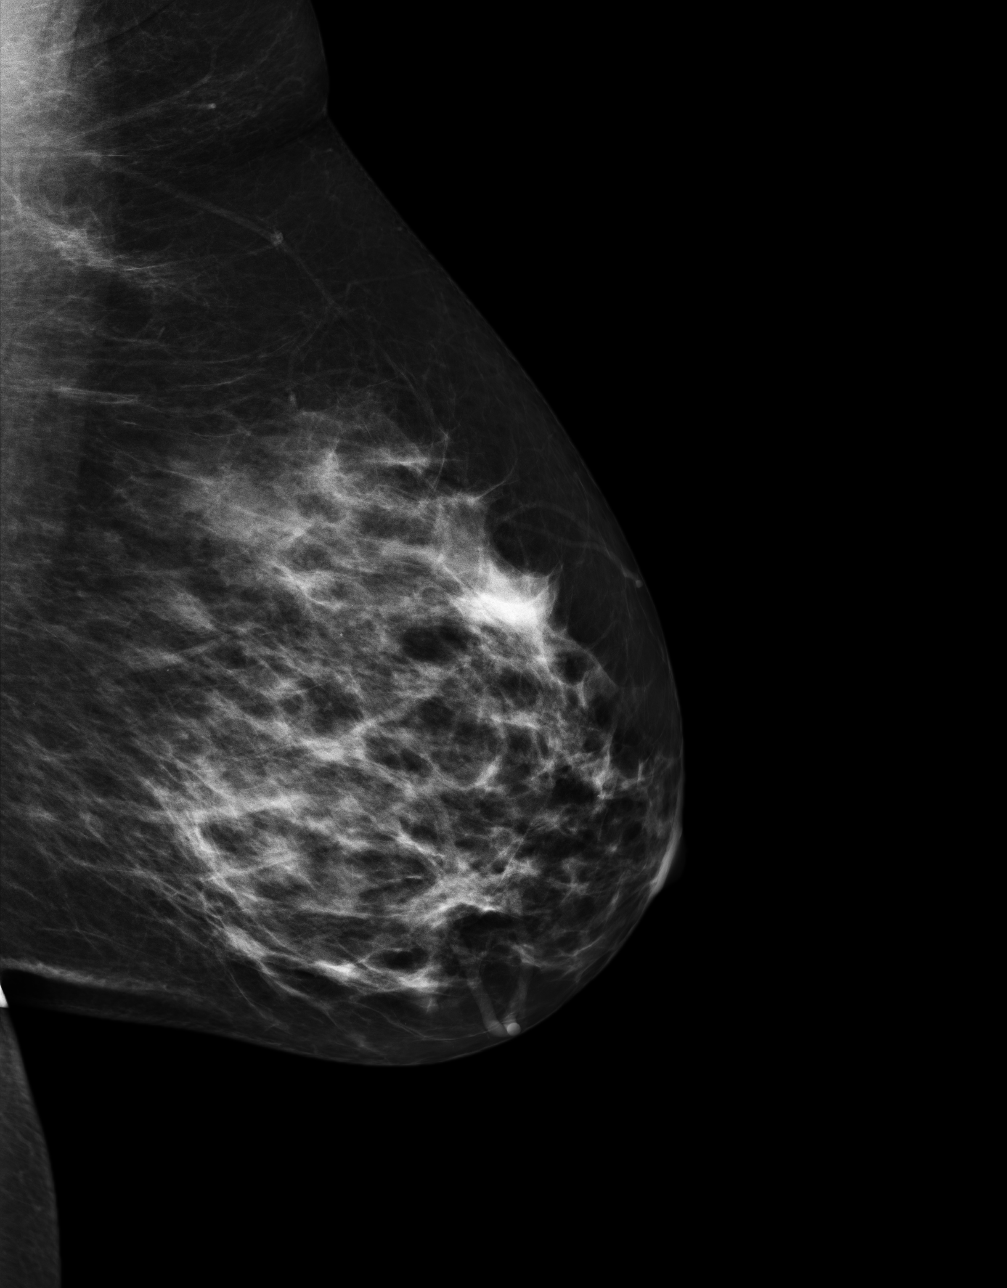

[4 of 4 positions shown; findings below may reference images not displayed]

ACR Breast Density Category c: The breasts are heterogeneously
dense, which may obscure small masses.
FINDINGS: There are no findings suspicious for malignancy. Images were
processed with CAD.
IMPRESSION: No mammographic evidence of malignancy. A result letter of this
screening mammogram will be mailed directly to the patient.

RECOMMENDATION:
Screening mammogram in one year. (Code:[M7])

BI-RADS CATEGORY  1: Negative

## 2013-05-30 ENCOUNTER — Emergency Department: Payer: Self-pay | Admitting: Emergency Medicine

## 2013-05-30 LAB — COMPREHENSIVE METABOLIC PANEL
ALK PHOS: 167 U/L — AB
ALT: 37 U/L (ref 12–78)
ANION GAP: 6 — AB (ref 7–16)
Albumin: 3.6 g/dL (ref 3.4–5.0)
BUN: 13 mg/dL (ref 7–18)
Bilirubin,Total: 0.2 mg/dL (ref 0.2–1.0)
CO2: 30 mmol/L (ref 21–32)
CREATININE: 1.05 mg/dL (ref 0.60–1.30)
Calcium, Total: 8.9 mg/dL (ref 8.5–10.1)
Chloride: 107 mmol/L (ref 98–107)
EGFR (African American): >60
EGFR (Non-African Amer.): >60
GLUCOSE: 138 mg/dL — AB (ref 65–99)
OSMOLALITY: 287 (ref 275–301)
Potassium: 3.7 mmol/L (ref 3.5–5.1)
SGOT(AST): 30 U/L (ref 15–37)
Sodium: 143 mmol/L (ref 136–145)
Total Protein: 8.3 g/dL — ABNORMAL HIGH (ref 6.4–8.2)

## 2013-05-30 LAB — URINALYSIS, COMPLETE
BACTERIA: NONE SEEN
Bilirubin,UR: NEGATIVE
Blood: NEGATIVE
Glucose,UR: NEGATIVE mg/dL (ref 0–75)
Ketone: NEGATIVE
LEUKOCYTE ESTERASE: NEGATIVE
NITRITE: NEGATIVE
PROTEIN: NEGATIVE
Ph: 5 (ref 4.5–8.0)
Specific Gravity: 1.028 (ref 1.003–1.030)
Squamous Epithelial: 4
WBC UR: <1 /HPF (ref 0–5)

## 2013-05-30 LAB — CBC WITH DIFFERENTIAL/PLATELET
BASOS ABS: 0.1 10*3/uL (ref 0.0–0.1)
Basophil %: 0.7 %
EOS ABS: 0.2 10*3/uL (ref 0.0–0.7)
EOS PCT: 3.2 %
HCT: 38.9 % (ref 35.0–47.0)
HGB: 12.4 g/dL (ref 12.0–16.0)
Lymphocyte #: 1.7 10*3/uL (ref 1.0–3.6)
Lymphocyte %: 22.7 %
MCH: 25.8 pg — AB (ref 26.0–34.0)
MCHC: 32 g/dL (ref 32.0–36.0)
MCV: 81 fL (ref 80–100)
MONOS PCT: 11.6 %
Monocyte #: 0.9 x10 3/mm (ref 0.2–0.9)
NEUTROS PCT: 61.8 %
Neutrophil #: 4.6 10*3/uL (ref 1.4–6.5)
Platelet: 321 10*3/uL (ref 150–440)
RBC: 4.82 10*6/uL (ref 3.80–5.20)
RDW: 14.6 % — ABNORMAL HIGH (ref 11.5–14.5)
WBC: 7.4 10*3/uL (ref 3.6–11.0)

## 2013-05-30 LAB — LIPASE, BLOOD: LIPASE: 135 U/L (ref 73–393)

## 2013-05-30 LAB — TROPONIN I

## 2013-05-30 LAB — PRO B NATRIURETIC PEPTIDE: B-Type Natriuretic Peptide: 98 pg/mL (ref 0–125)

## 2013-05-30 IMAGING — CR DG CHEST 2V
1 series · 2 of 2 positions shown · non-contrast
Comparison: None.

CLINICAL DATA: Fevers

EXAM:
CHEST  2 VIEW

[Series 1: w chest pa · 0.14mm/px · 2 of 2 slices shown]
[im 1/2]
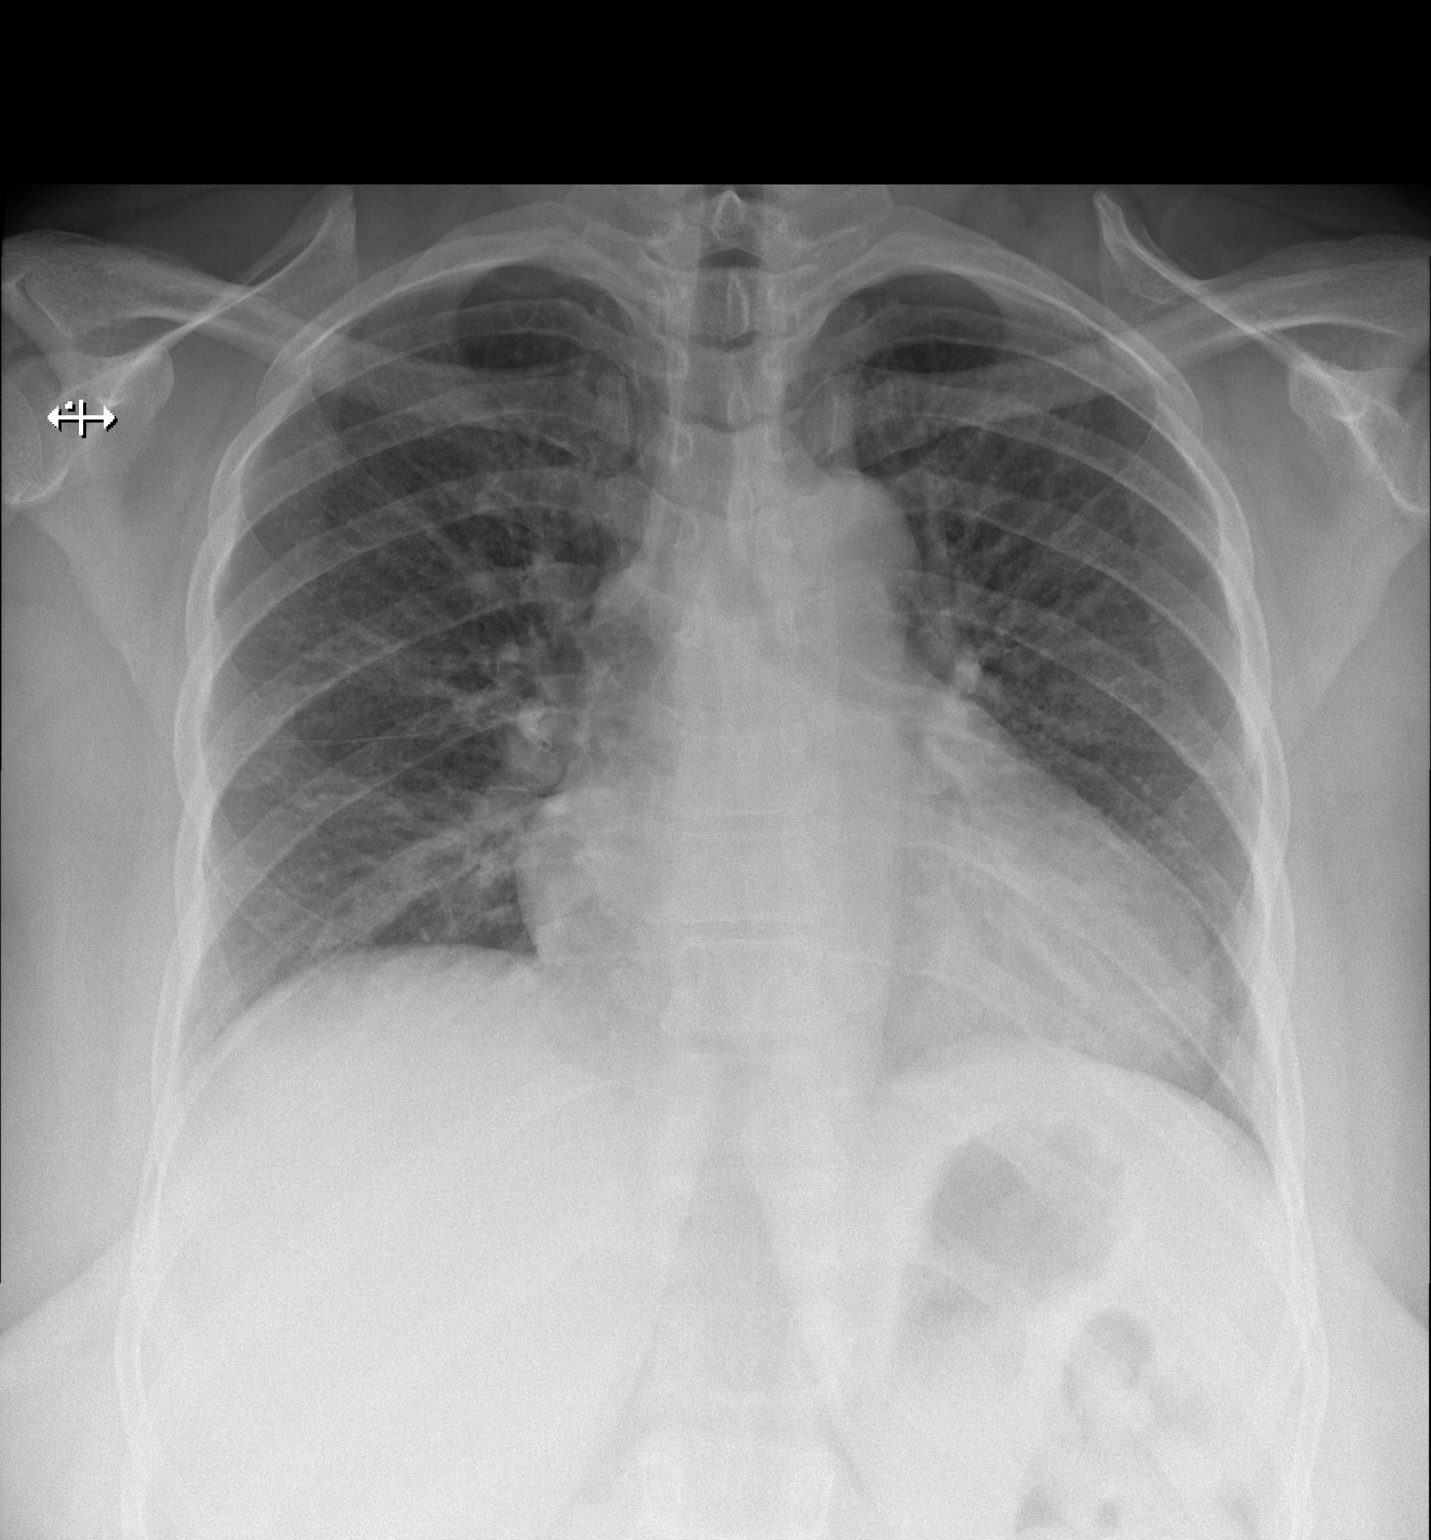
[im 2/2]
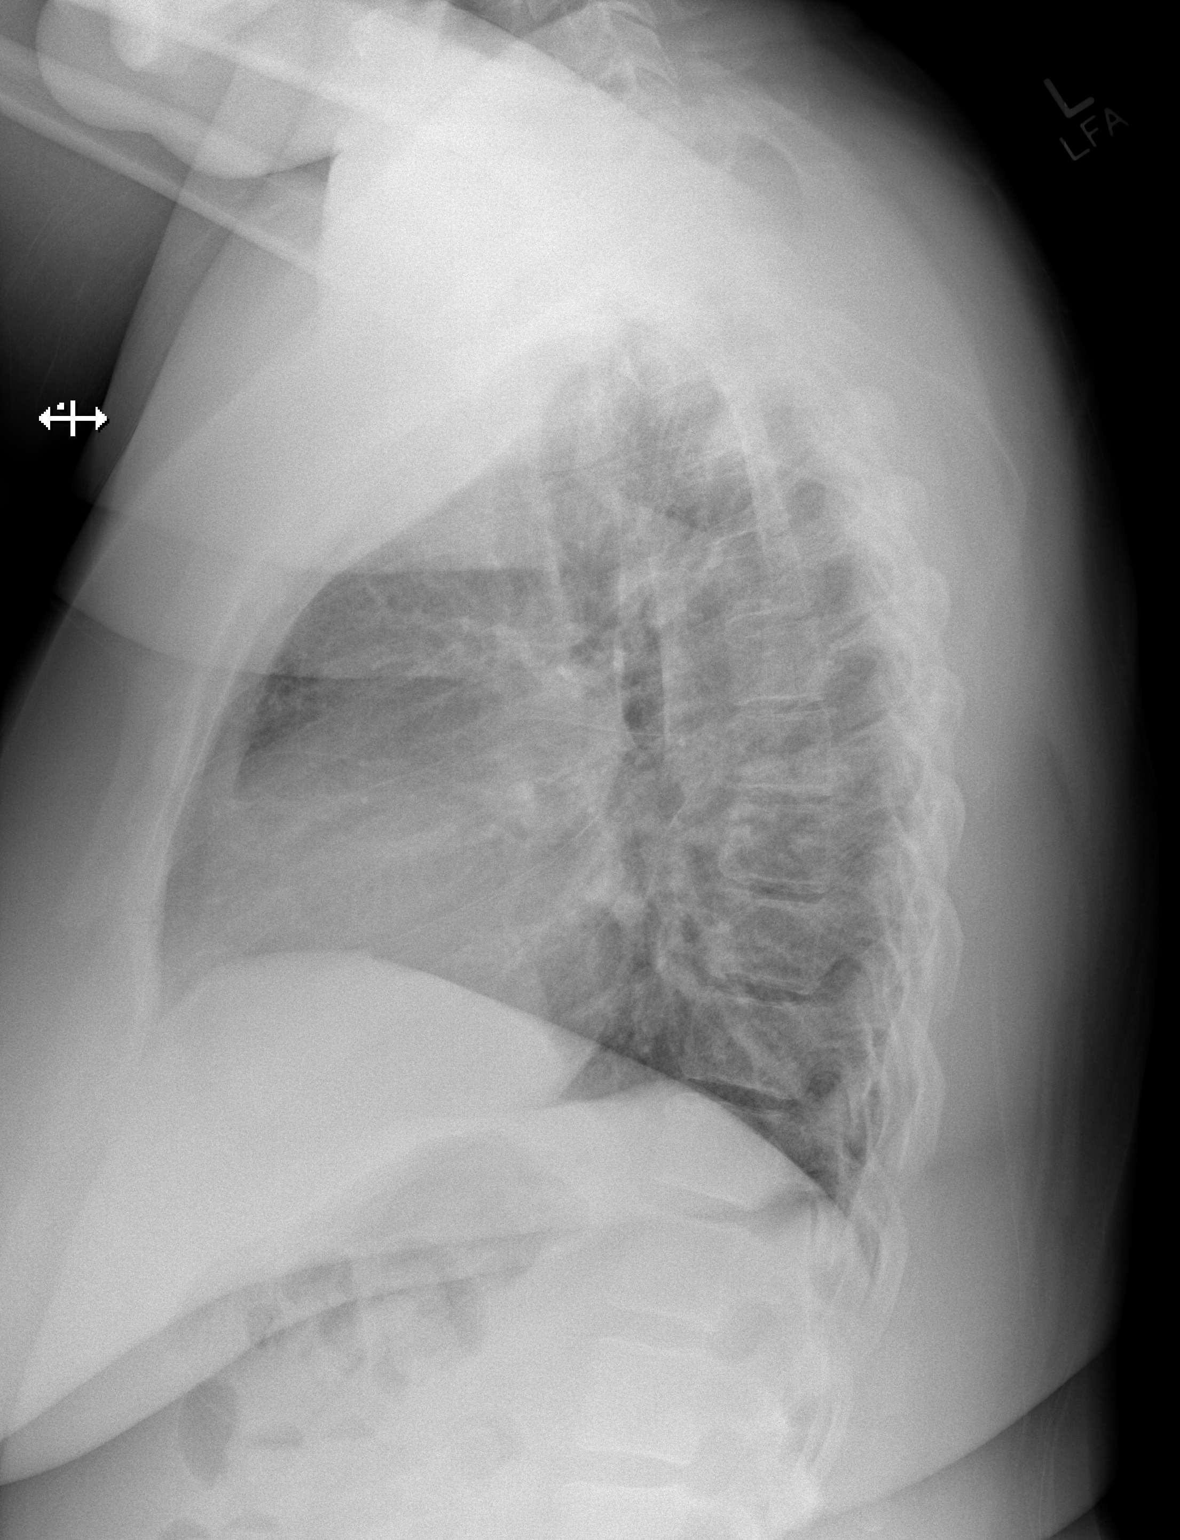

[2 of 2 positions shown; findings below may reference images not displayed]

FINDINGS: Cardiac shadow is enlarged in size. The lungs are well aerated
bilaterally without focal infiltrate or sizable effusion. No acute
bony abnormality is seen.
IMPRESSION: No active cardiopulmonary disease.

## 2013-08-06 ENCOUNTER — Ambulatory Visit: Payer: Self-pay

## 2013-08-11 ENCOUNTER — Ambulatory Visit: Payer: Self-pay

## 2013-08-24 ENCOUNTER — Emergency Department: Payer: Self-pay | Admitting: Emergency Medicine

## 2014-01-19 ENCOUNTER — Ambulatory Visit: Payer: Self-pay

## 2014-01-19 IMAGING — MG MM DIGITAL SCREENING BILAT W/ CAD
5 series · 5 of 5 positions shown · non-contrast
Comparison: Previous Exam(s)

CLINICAL DATA: Screening.

EXAM:
DIGITAL SCREENING BILATERAL MAMMOGRAM WITH CAD

[L MLO]
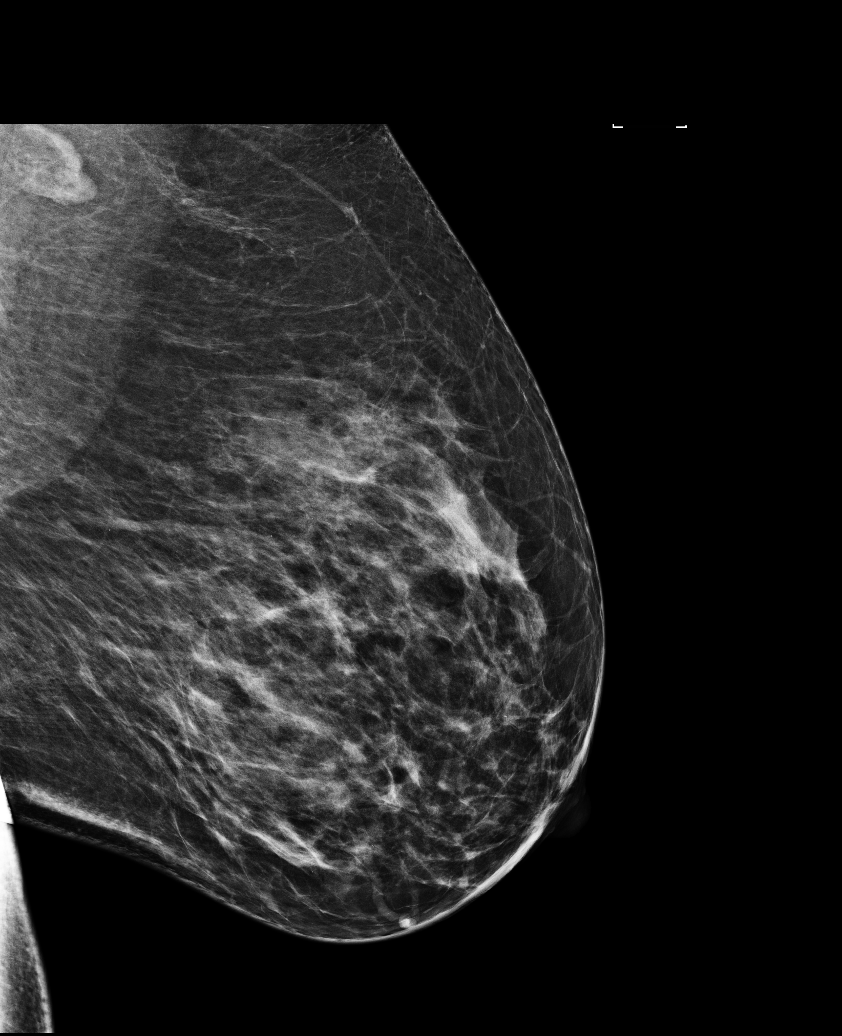

[R CC (1 of 2)]
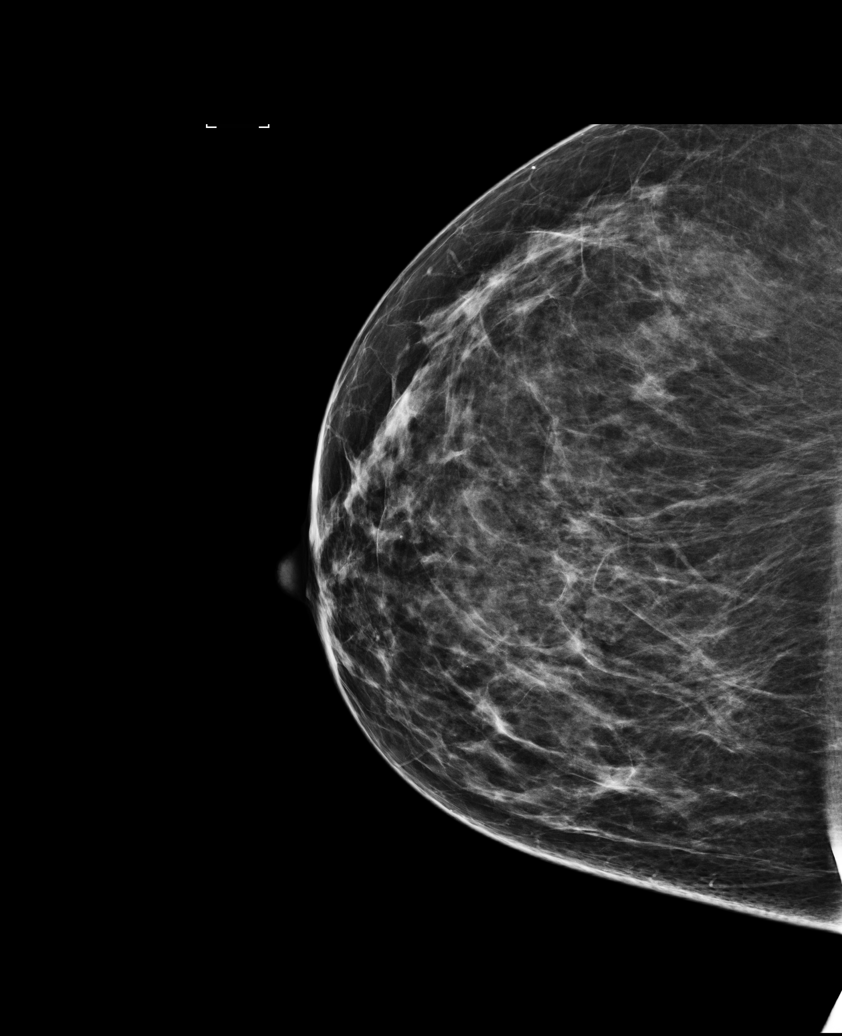

[L CC]
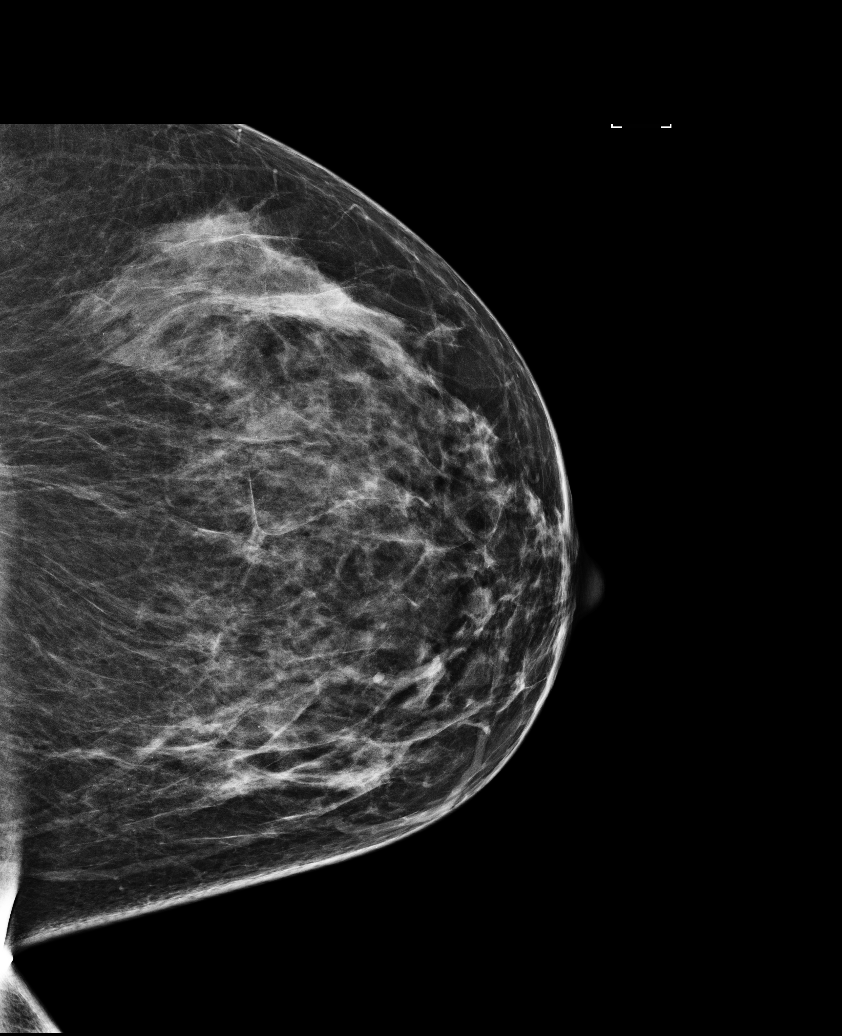

[R MLO]
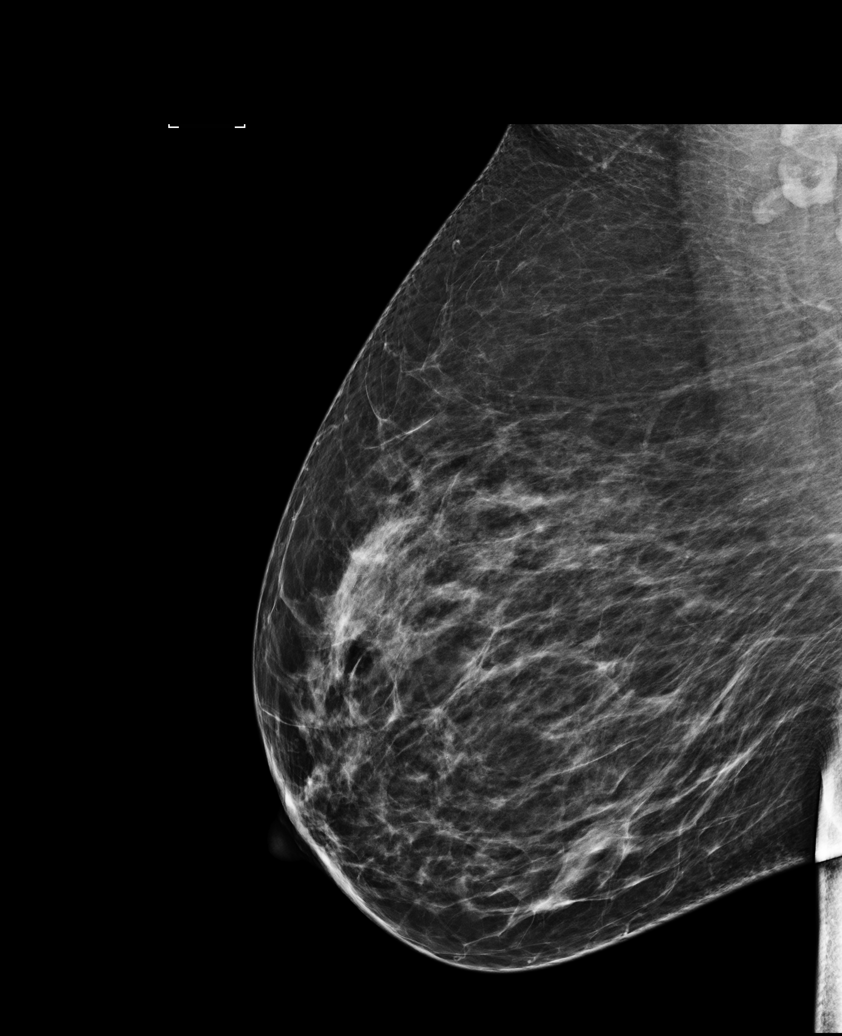

[R CC (2 of 2)]
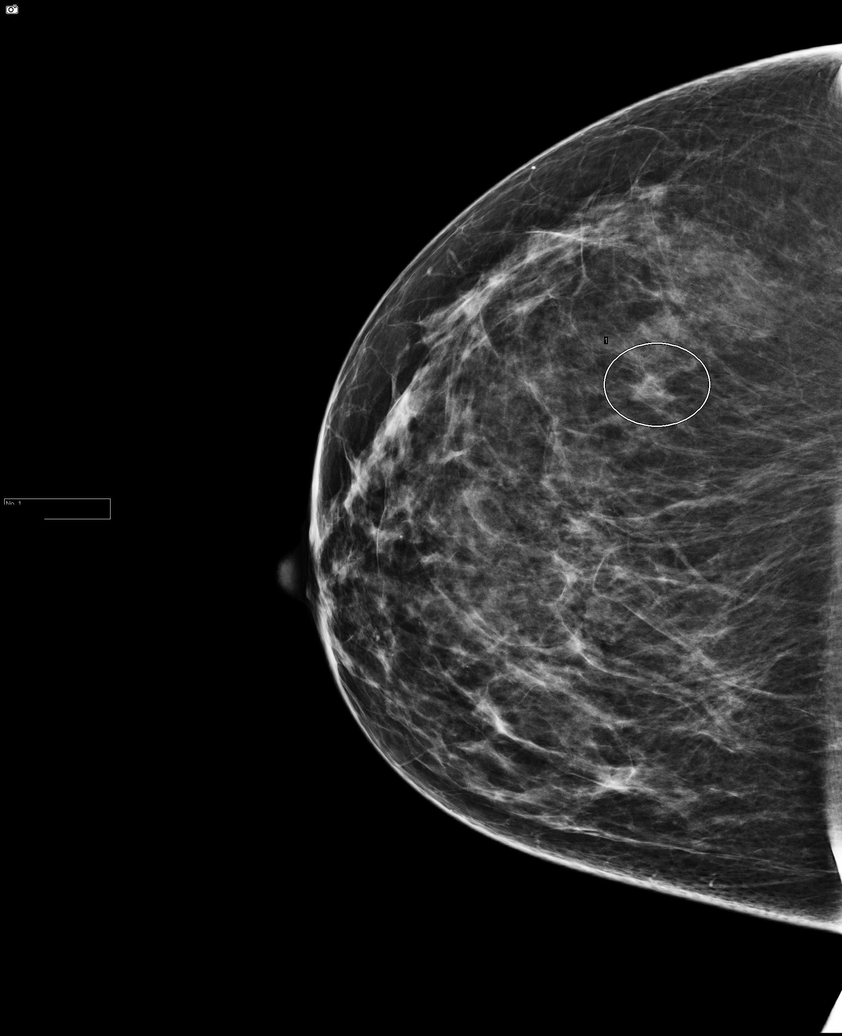

[5 of 5 positions shown; findings below may reference images not displayed]

ACR Breast Density Category c: The breast tissue is heterogeneously
dense, which may obscure small masses.
FINDINGS: In the right breast, a possible asymmetry warrants further
evaluation with spot compression views and possibly ultrasound. In
the left breast, no findings suspicious for malignancy. Images were
processed with CAD.
IMPRESSION: Further evaluation is suggested for possible asymmetry in the right
breast.

RECOMMENDATION:
Diagnostic mammogram and possibly ultrasound of the right breast.
(Code:[7G])

The patient will be contacted regarding the findings, and additional
imaging will be scheduled.

BI-RADS CATEGORY  0: Incomplete. Need additional imaging evaluation
and/or prior mammograms for comparison.

## 2014-01-27 ENCOUNTER — Ambulatory Visit: Payer: Self-pay

## 2014-01-27 IMAGING — MG MM ADDITIONAL VIEWS AT NO CHARGE
2 series · 2 of 2 positions shown · non-contrast
Comparison: [DATE] and earlier.

CLINICAL DATA: Callback for right breast asymmetry

EXAM:
DIGITAL DIAGNOSTIC  RIGHT MAMMOGRAM WITH CAD
ULTRASOUND RIGHT BREAST

[R CC]
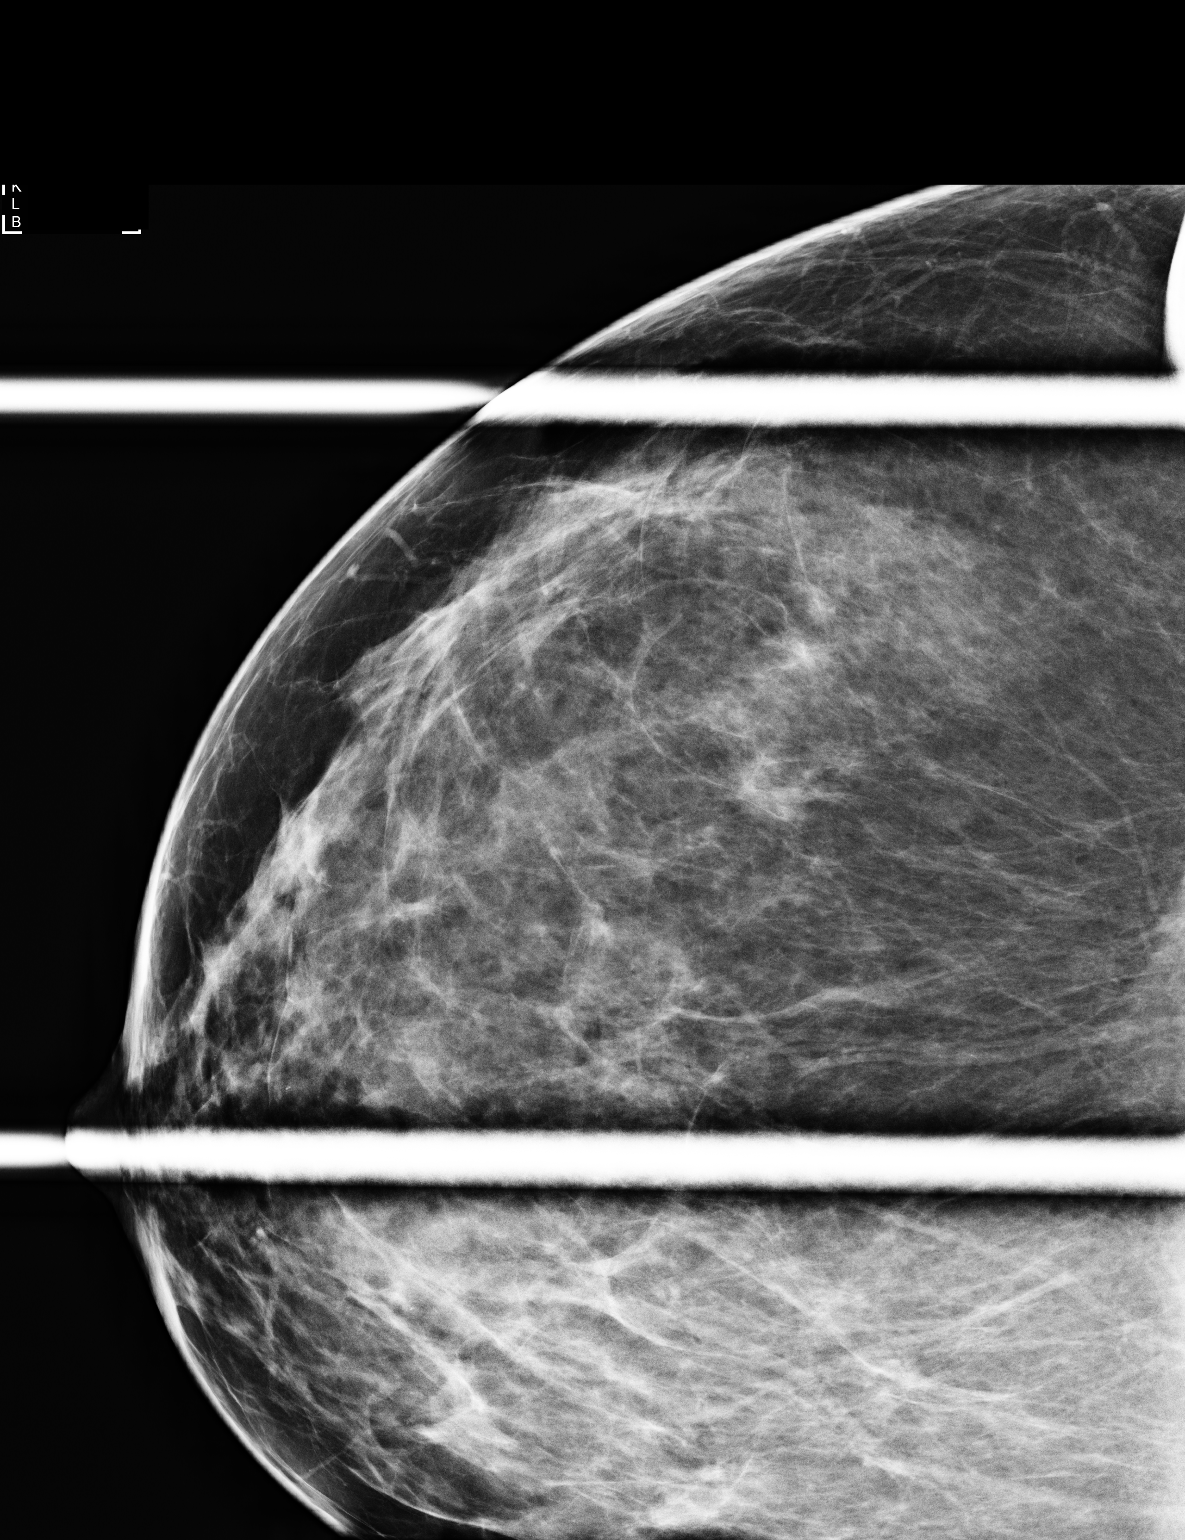

[R ML]
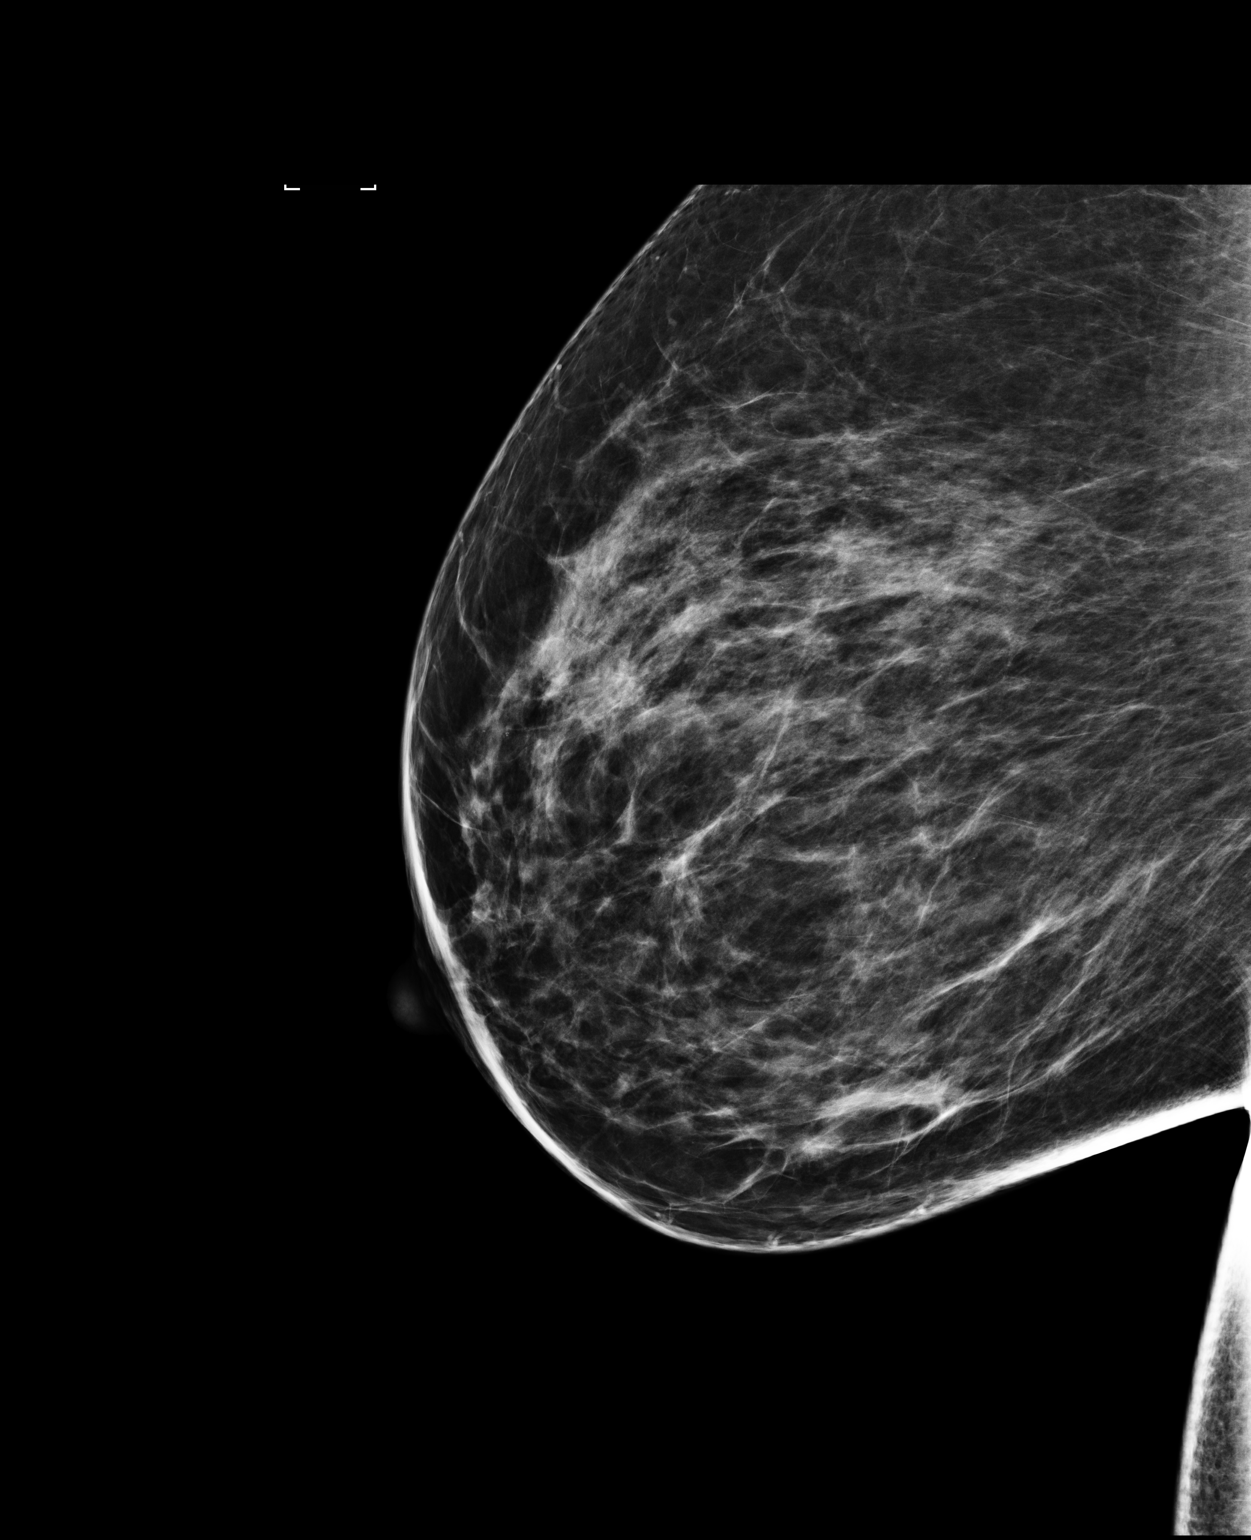

[2 of 2 positions shown; findings below may reference images not displayed]

ACR Breast Density Category c: The breast tissue is heterogeneously
dense, which may obscure small masses.
FINDINGS: The patient returns for additional views of an asymmetry identified
in the lateral right breast, approximately 9 cm posterior to the
nipple. There is no correlate on the MLO projection. On additional
imaging this asymmetry appears to efface to the patient's usual
baseline. No definite associated suspicious mass, distortion or
calcifications are identified.

Mammographic images were processed with CAD.

On physical exam, no suspicious mass is palpated.

Ultrasound is performed, showing no suspicious mass, shadowing or
fluid collection in the lateral right breast. Only normal
fibroglandular tissue is identified.
IMPRESSION: No mammographic or sonographic evidence of malignancy. Right breast
asymmetry appears to correspond to normal fibroglandular tissue.

RECOMMENDATION:
Recommend annual screening mammograms. The patient should return
sooner if clinically indicated.

I have discussed the findings and recommendations with the patient.
Results were also provided in writing at the conclusion of the
visit. If applicable, a reminder letter will be sent to the patient
regarding the next appointment.

BI-RADS CATEGORY  1: Negative.

## 2014-01-27 IMAGING — US US BREAST*R* LIMITED INC AXILLA
1 series · 4 of 4 positions shown · non-contrast
Comparison: [DATE] and earlier.

CLINICAL DATA: Callback for right breast asymmetry

EXAM:
DIGITAL DIAGNOSTIC  RIGHT MAMMOGRAM WITH CAD
ULTRASOUND RIGHT BREAST

[Series 1: us breast*right* limited inc axilla · 0.08mm/px · 4 of 4 slices shown]
[im 1/4]
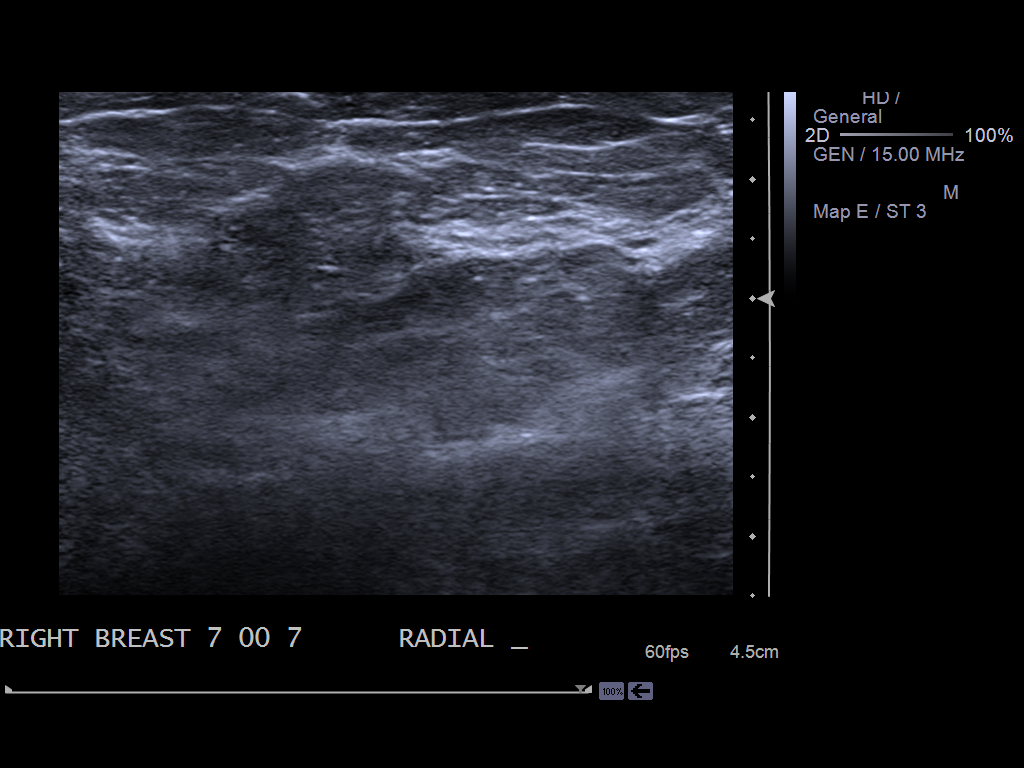
[im 2/4]
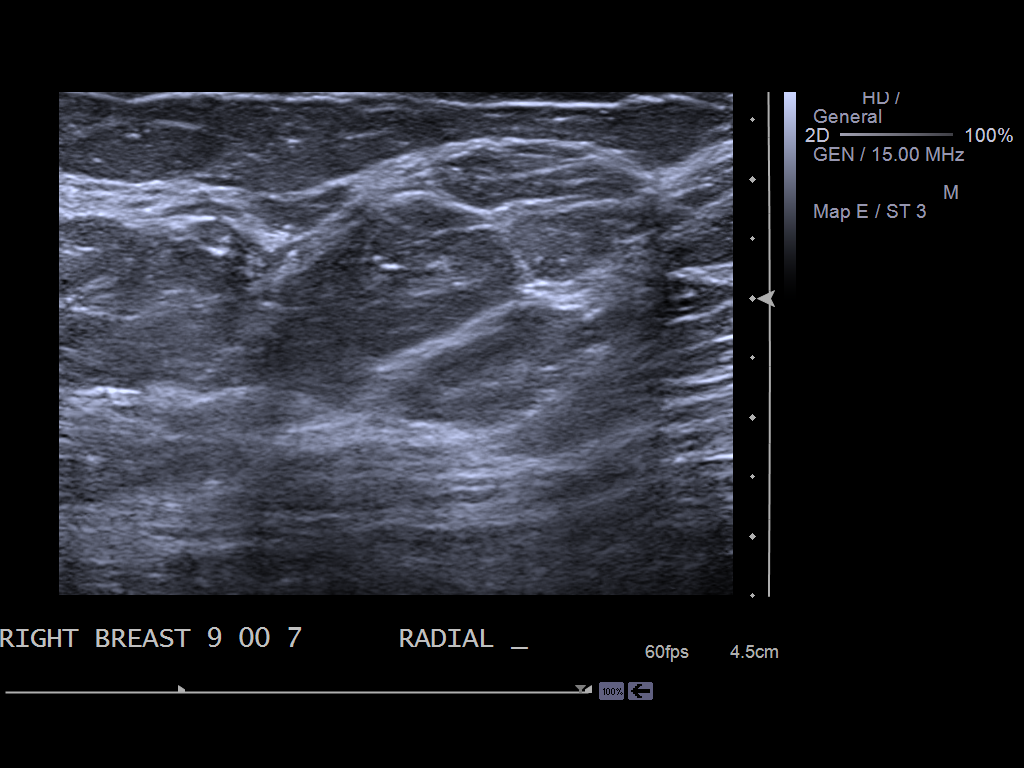
[im 3/4]
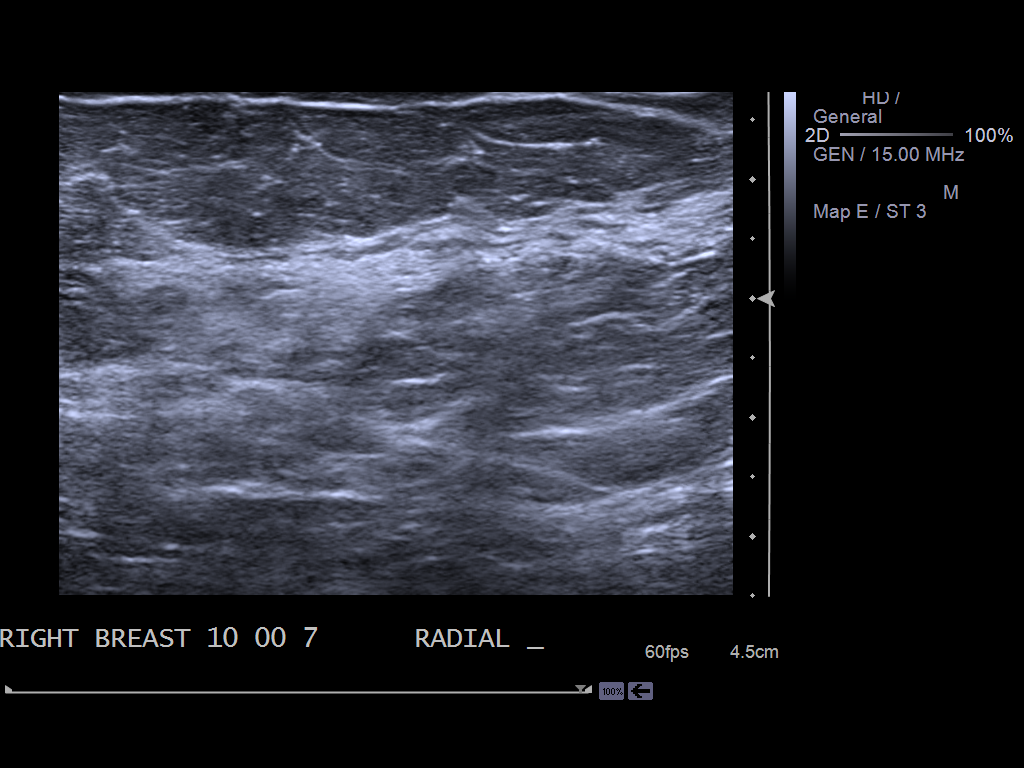
[im 4/4]
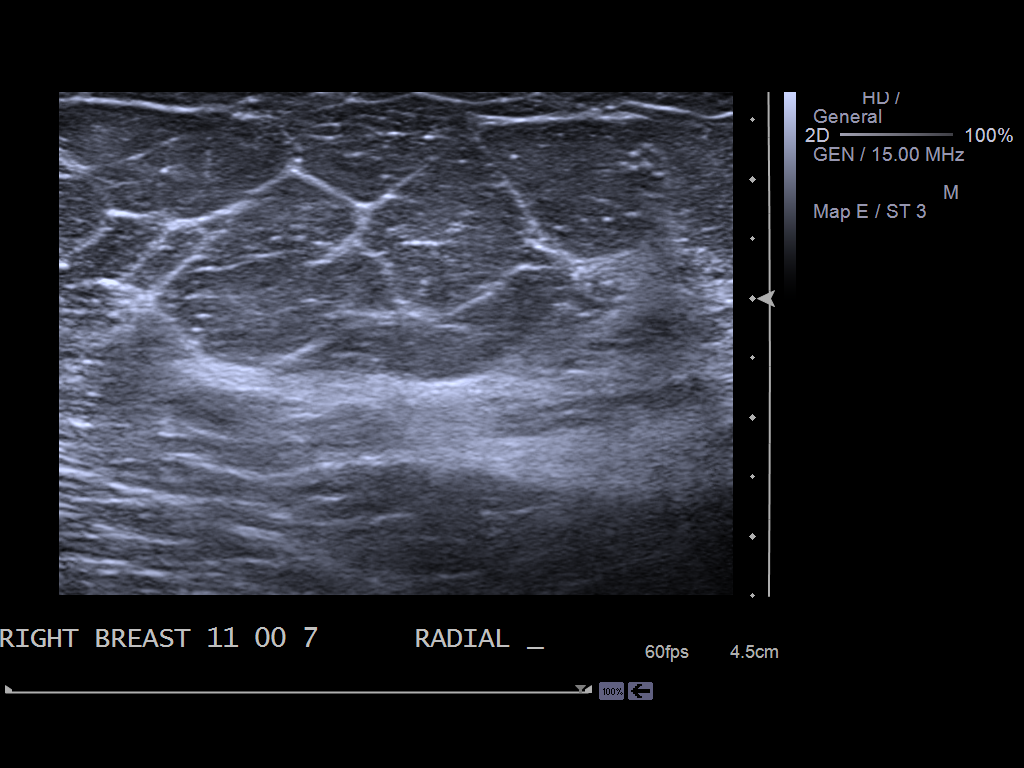

[4 of 4 positions shown; findings below may reference images not displayed]

ACR Breast Density Category c: The breast tissue is heterogeneously
dense, which may obscure small masses.
FINDINGS: The patient returns for additional views of an asymmetry identified
in the lateral right breast, approximately 9 cm posterior to the
nipple. There is no correlate on the MLO projection. On additional
imaging this asymmetry appears to efface to the patient's usual
baseline. No definite associated suspicious mass, distortion or
calcifications are identified.

Mammographic images were processed with CAD.

On physical exam, no suspicious mass is palpated.

Ultrasound is performed, showing no suspicious mass, shadowing or
fluid collection in the lateral right breast. Only normal
fibroglandular tissue is identified.
IMPRESSION: No mammographic or sonographic evidence of malignancy. Right breast
asymmetry appears to correspond to normal fibroglandular tissue.

RECOMMENDATION:
Recommend annual screening mammograms. The patient should return
sooner if clinically indicated.

I have discussed the findings and recommendations with the patient.
Results were also provided in writing at the conclusion of the
visit. If applicable, a reminder letter will be sent to the patient
regarding the next appointment.

BI-RADS CATEGORY  1: Negative.

## 2014-03-20 ENCOUNTER — Emergency Department: Payer: Self-pay | Admitting: Emergency Medicine

## 2014-03-20 IMAGING — CR DG KNEE COMPLETE 4+V*R*
1 series · 4 of 4 positions shown · non-contrast
Comparison: None.

CLINICAL DATA: Anterior knee pain and swelling since fatty. No
known injury.

EXAM:
RIGHT KNEE - COMPLETE 4+ VIEW

[Series 1: t knee ap right · 0.14mm/px · 4 of 4 slices shown]
[im 1/4]
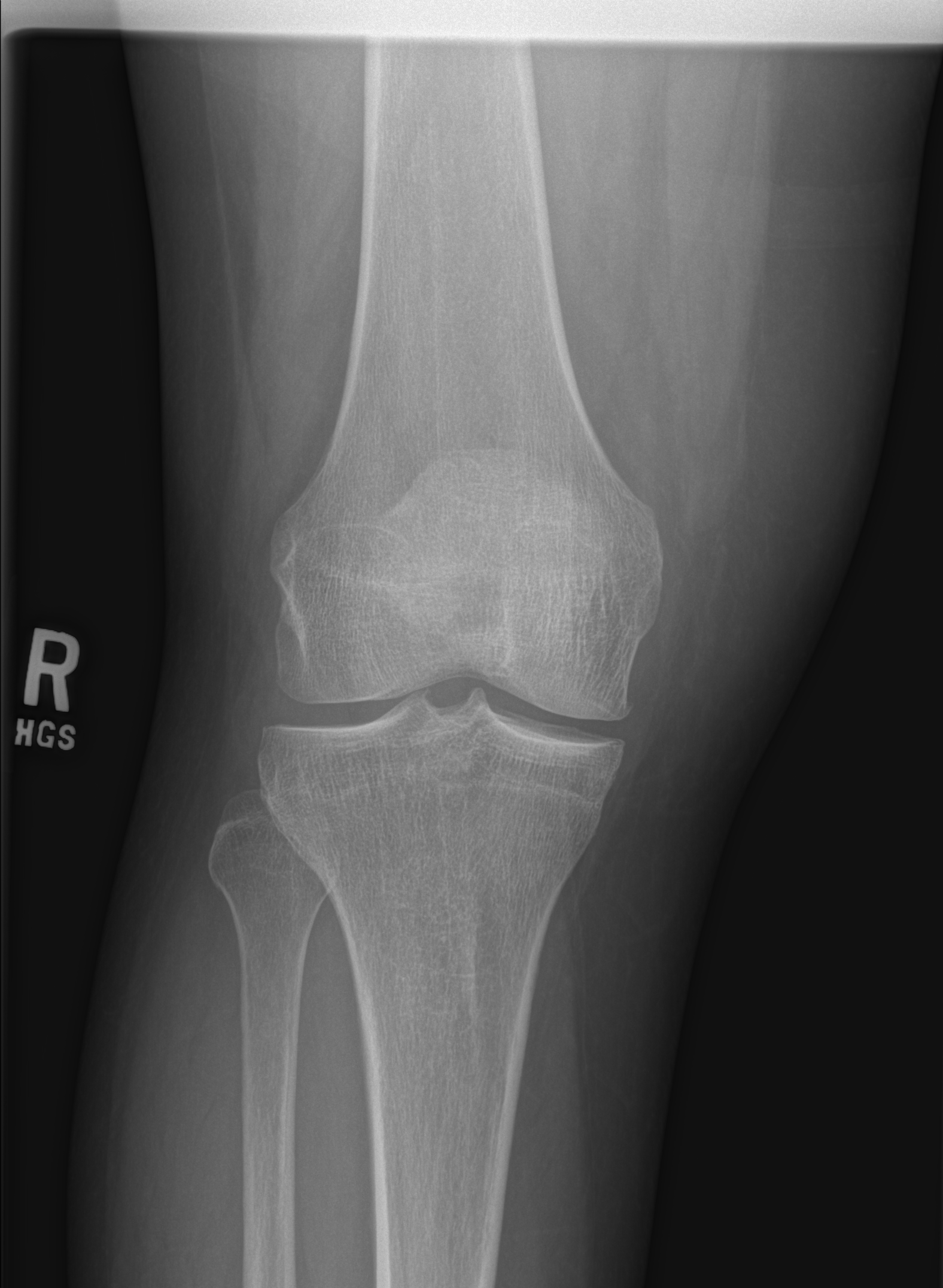
[im 2/4]
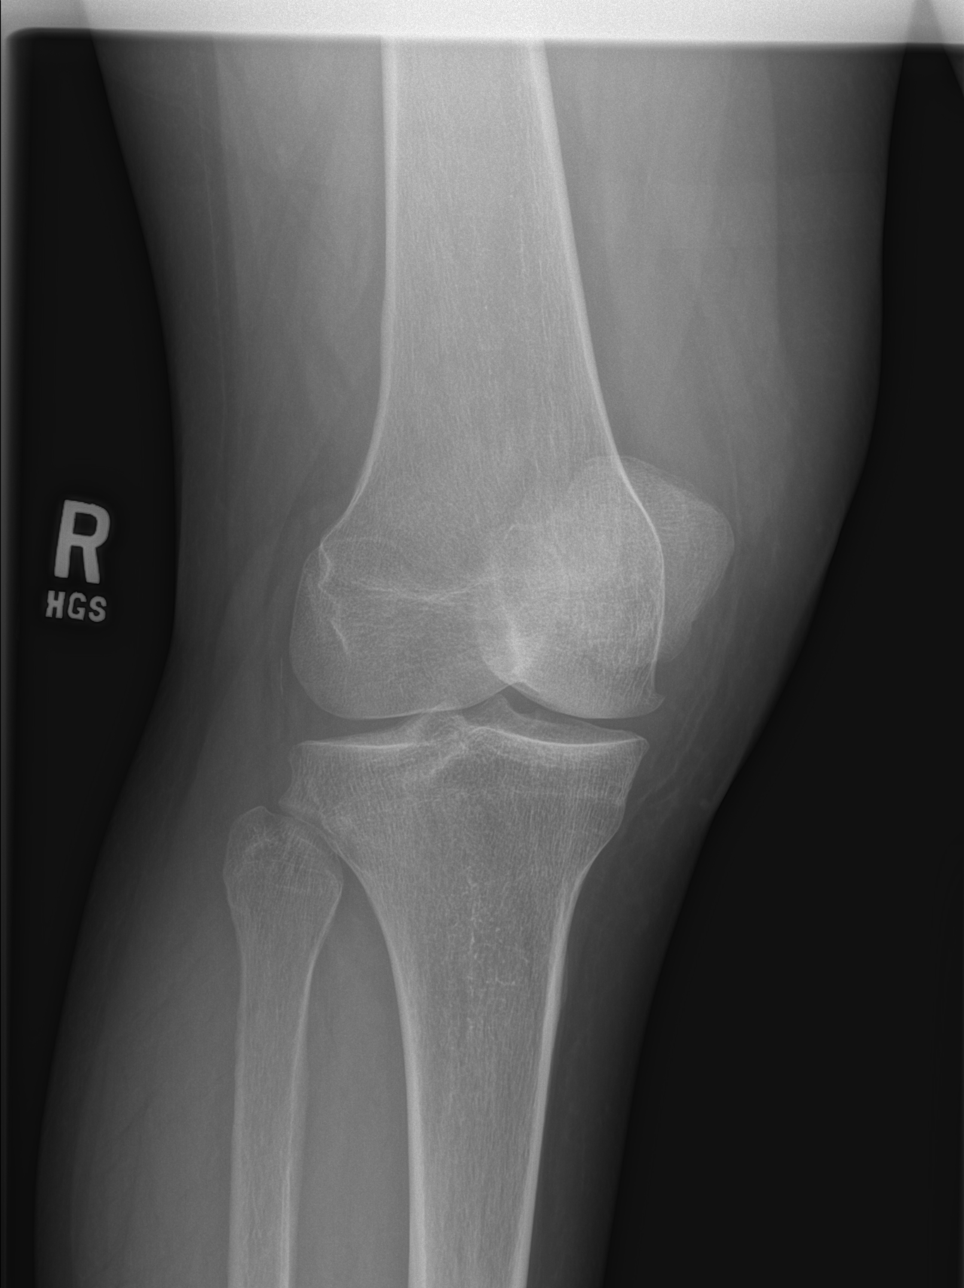
[im 3/4]
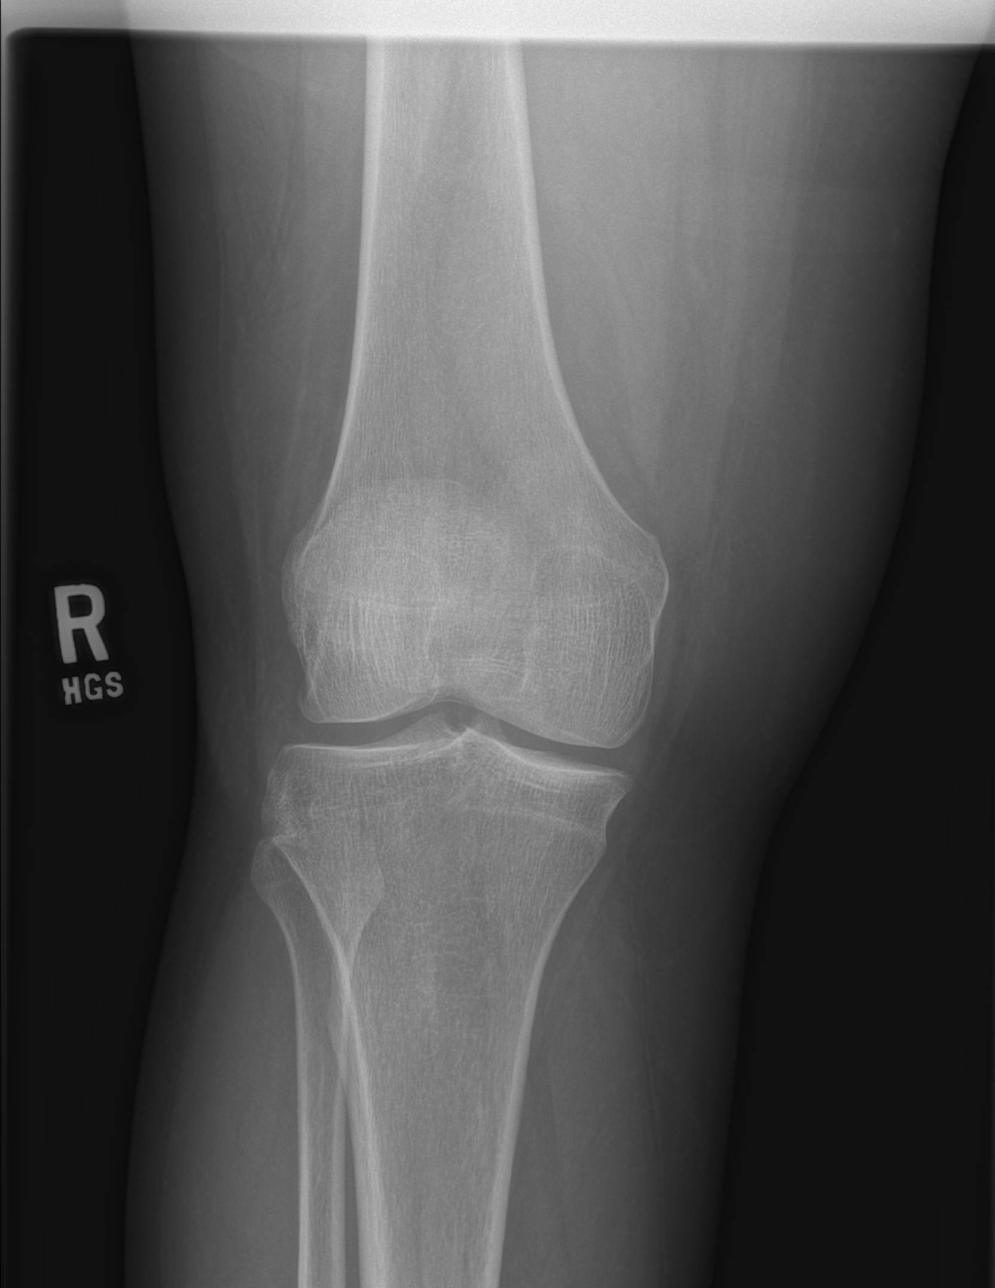
[im 4/4]
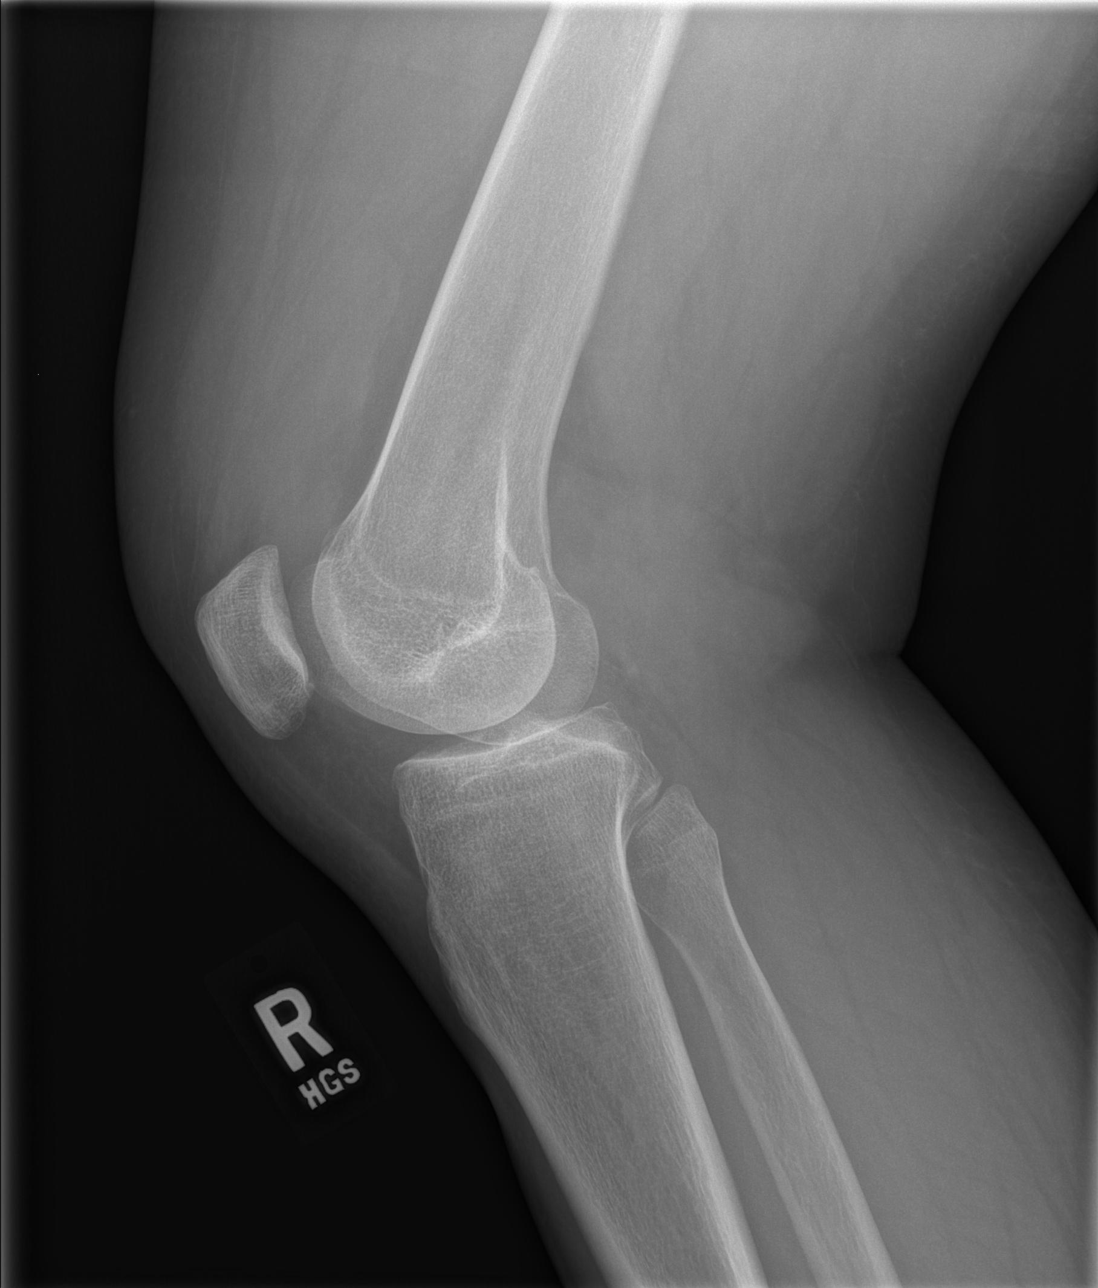

[4 of 4 positions shown; findings below may reference images not displayed]

FINDINGS: Moderate joint effusion is present. No acute fracture or
dislocation. No radiopaque foreign body or soft tissue gas. There is
minimal medial compartmental narrowing.
IMPRESSION: Moderate joint effusion.  No fracture.

## 2014-06-22 DIAGNOSIS — Z8669 Personal history of other diseases of the nervous system and sense organs: Secondary | ICD-10-CM

## 2014-06-22 HISTORY — DX: Personal history of other diseases of the nervous system and sense organs: Z86.69

## 2014-08-04 DIAGNOSIS — E119 Type 2 diabetes mellitus without complications: Secondary | ICD-10-CM | POA: Insufficient documentation

## 2014-08-04 DIAGNOSIS — G510 Bell's palsy: Secondary | ICD-10-CM

## 2014-08-04 HISTORY — DX: Type 2 diabetes mellitus without complications: E11.9

## 2014-08-04 HISTORY — DX: Bell's palsy: G51.0

## 2014-08-11 ENCOUNTER — Ambulatory Visit: Payer: Self-pay | Admitting: Ophthalmology

## 2014-08-12 IMAGING — US US ABDOMEN LIMITED
1 series · 14 of 25 positions shown · non-contrast
Comparison: None.

CLINICAL DATA: Elevated alkaline phosphatase.

EXAM:
US ABDOMEN LIMITED - RIGHT UPPER QUADRANT

[Series 1: us abdomen limited · 0.23mm/px · 14 of 50 slices shown]
[im 1/50]
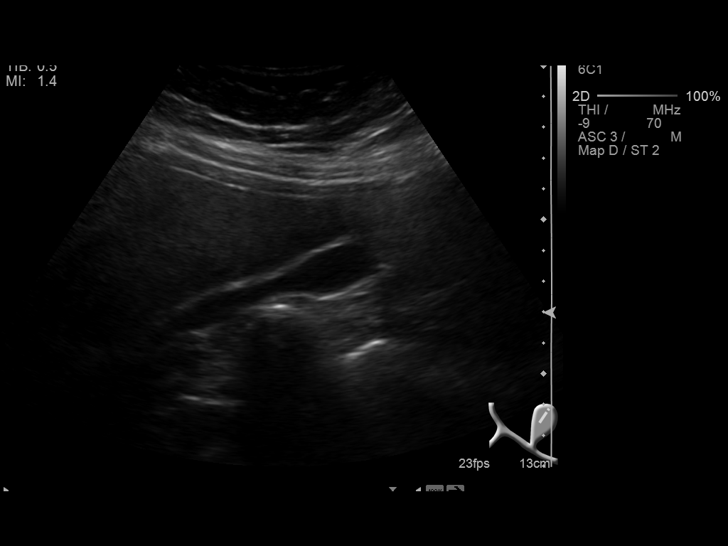
[im 5/50]
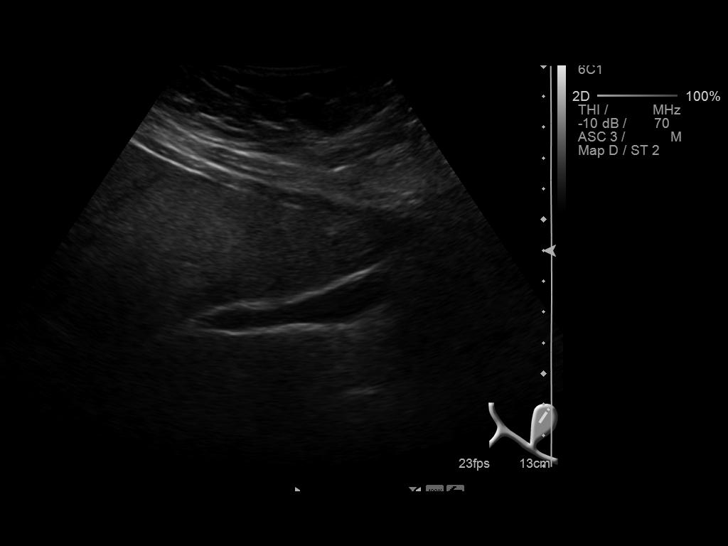
[im 9/50]
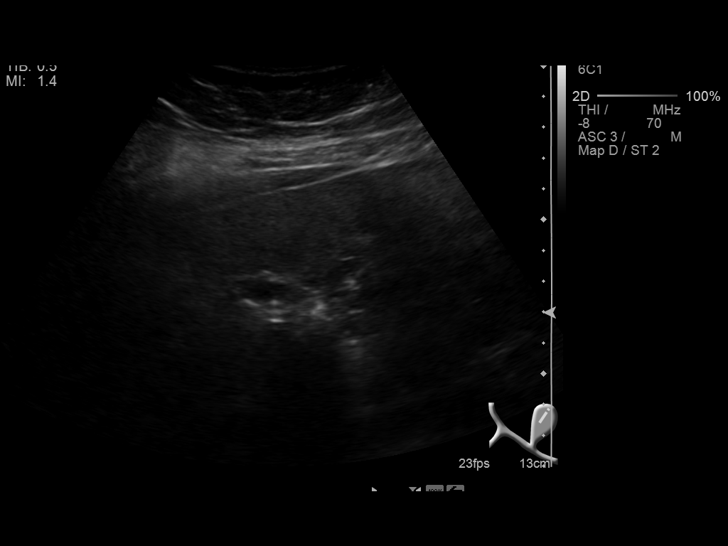
[im 13/50]
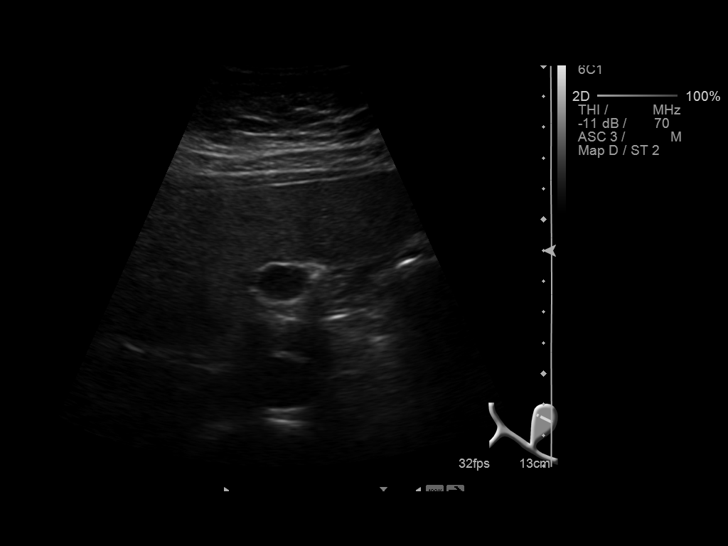
[im 17/50]
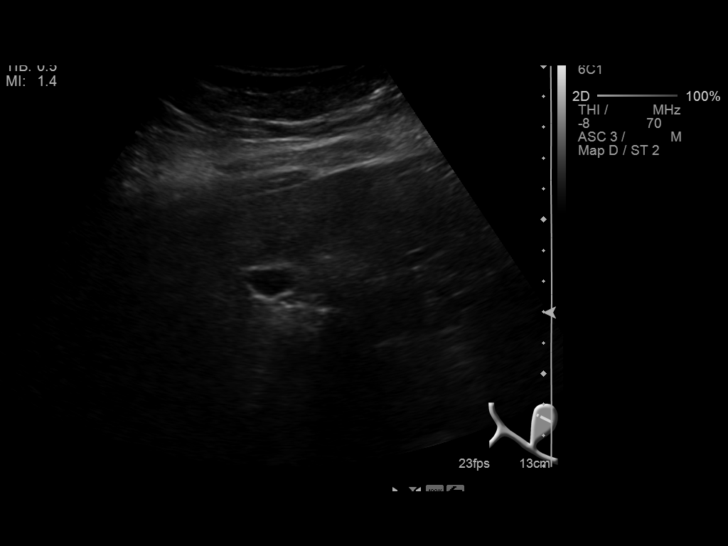
[im 19/50]
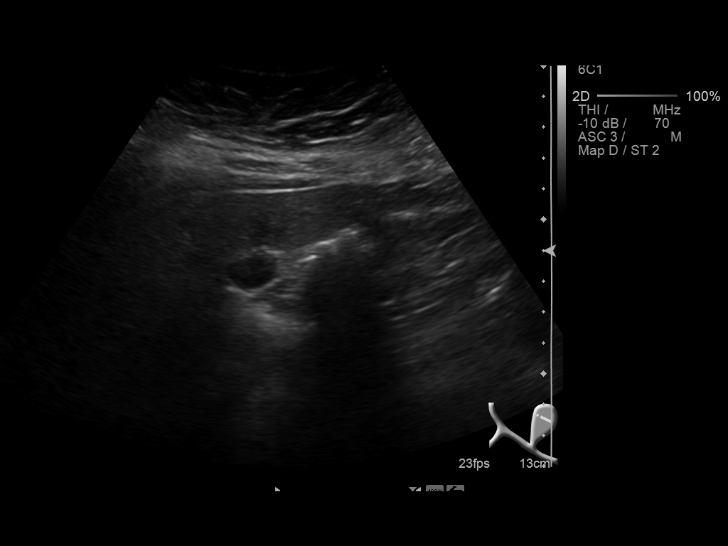
[im 23/50]
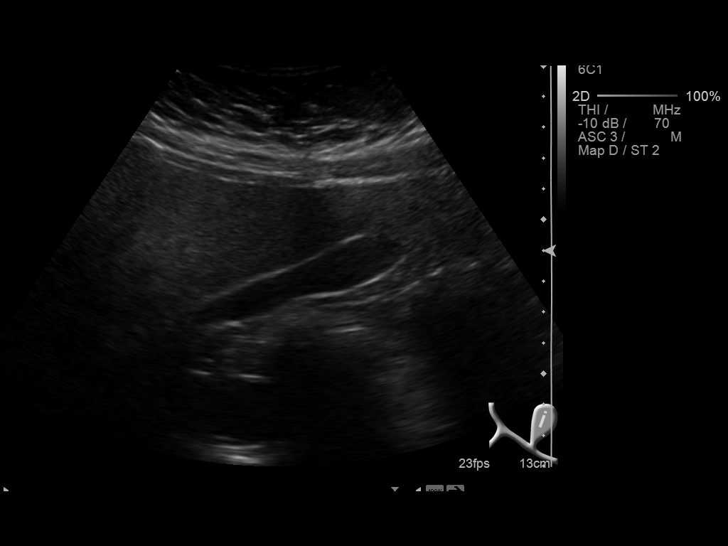
[im 27/50]
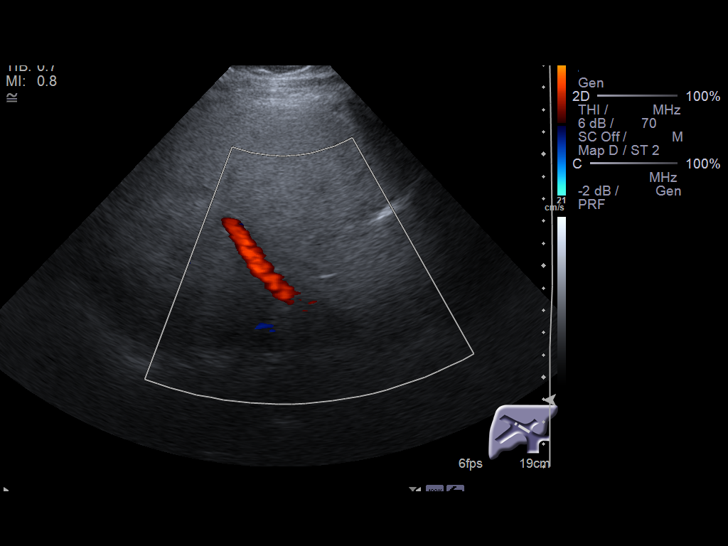
[im 31/50]
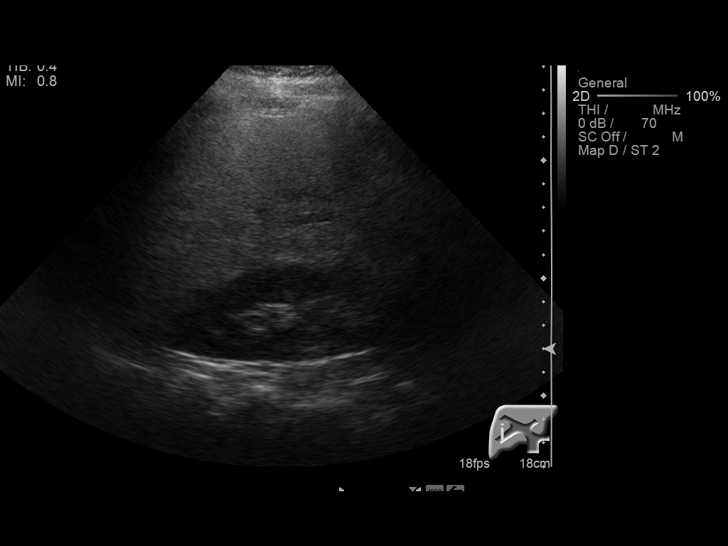
[im 33/50]
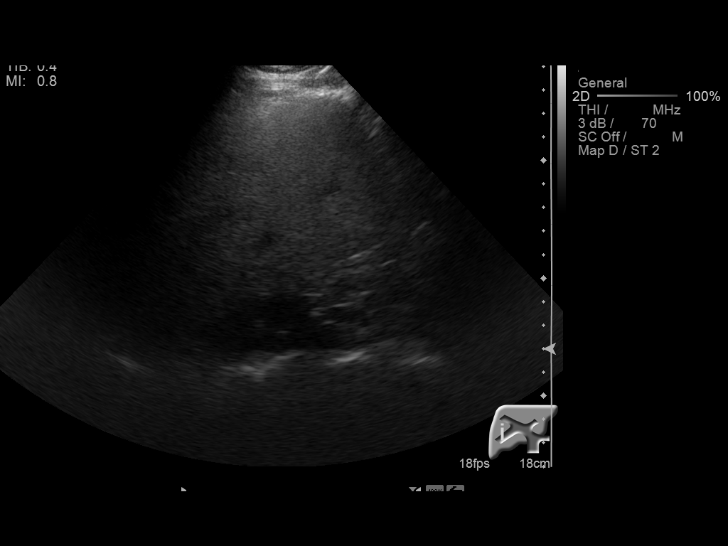
[im 37/50]
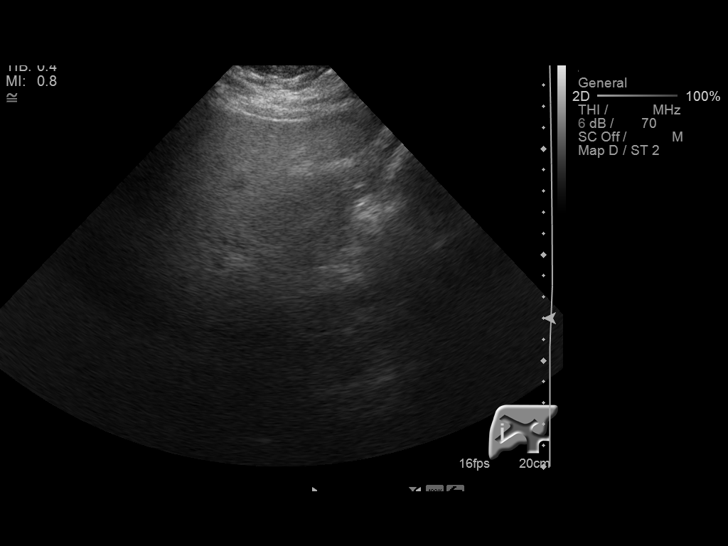
[im 41/50]
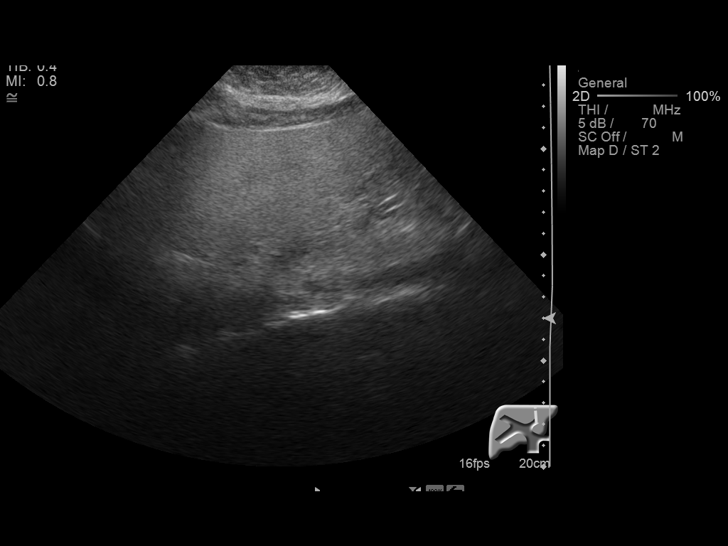
[im 45/50]
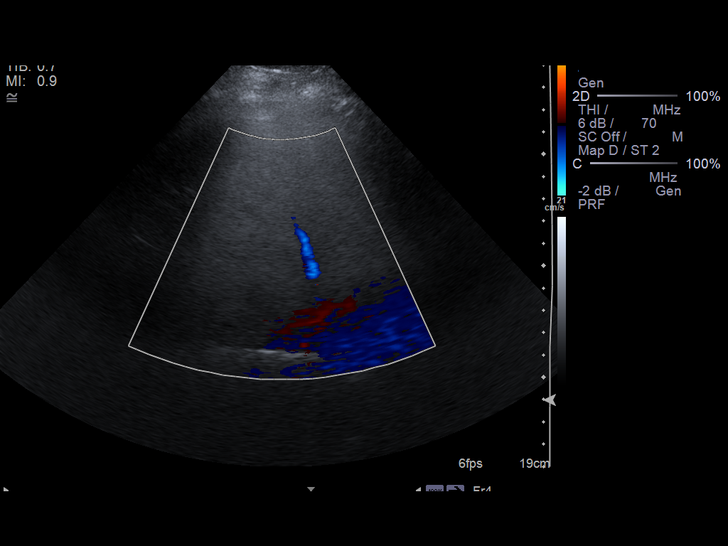
[im 50/50]
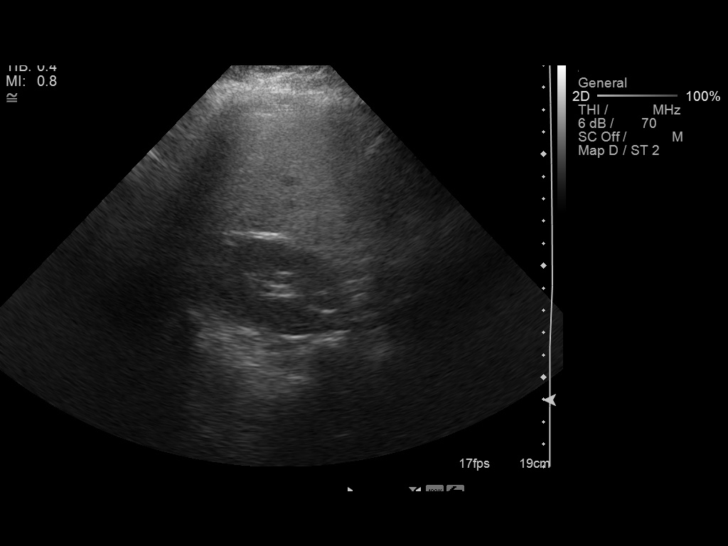

[14 of 25 positions shown; findings below may reference images not displayed]

FINDINGS: Gallbladder:

No gallstones or wall thickening visualized. No sonographic Murphy
sign noted.

Common bile duct:

Diameter: 2.2 mm

Liver:

Liver is slightly echogenic suggesting fatty infiltration and/or
hepatocellular disease.
IMPRESSION: Liver is echogenic suggesting fatty infiltration and/or
hepatocellular disease. Exam otherwise unremarkable.

## 2014-08-18 ENCOUNTER — Ambulatory Visit: Payer: Self-pay

## 2014-08-24 ENCOUNTER — Other Ambulatory Visit: Payer: Self-pay | Admitting: Internal Medicine

## 2014-08-24 DIAGNOSIS — R748 Abnormal levels of other serum enzymes: Secondary | ICD-10-CM

## 2014-08-29 ENCOUNTER — Ambulatory Visit
Admission: RE | Admit: 2014-08-29 | Discharge: 2014-08-29 | Disposition: A | Discharge status: Home / Self Care | Payer: Self-pay | Source: Ambulatory Visit | Attending: Internal Medicine | Admitting: Internal Medicine

## 2014-08-29 DIAGNOSIS — R748 Abnormal levels of other serum enzymes: Secondary | ICD-10-CM | POA: Insufficient documentation

## 2014-09-15 ENCOUNTER — Ambulatory Visit: Payer: Self-pay

## 2014-09-15 ENCOUNTER — Ambulatory Visit: Payer: Self-pay | Admitting: Ophthalmology

## 2014-09-15 LAB — BASIC METABOLIC PANEL
BUN: 11 mg/dL (ref 4–21)
Creatinine: 0.8 mg/dL (ref 0.5–1.1)
GLUCOSE: 351 mg/dL
Potassium: 3.9 mmol/L (ref 3.4–5.3)
Sodium: 137 mmol/L (ref 137–147)

## 2014-09-15 LAB — LIPID PANEL
CHOLESTEROL: 176 mg/dL (ref 0–200)
HDL: 38 mg/dL (ref 35–70)
LDL CALC: 93 mg/dL
Triglycerides: 227 mg/dL — AB (ref 40–160)

## 2014-09-15 LAB — HEMOGLOBIN A1C: Hemoglobin A1C: 11.7

## 2014-09-15 LAB — HEPATIC FUNCTION PANEL
ALT: 32 U/L (ref 7–35)
AST: 15 U/L (ref 13–35)
Alkaline Phosphatase: 211 U/L — AB (ref 25–125)
Bilirubin, Total: 0.2 mg/dL

## 2014-09-15 LAB — MICROALBUMIN, URINE: MICROALB UR: 58.2

## 2014-09-21 ENCOUNTER — Institutional Professional Consult (permissible substitution): Payer: Self-pay | Admitting: Specialist

## 2014-10-05 ENCOUNTER — Encounter: Payer: Self-pay | Admitting: Emergency Medicine

## 2014-10-05 ENCOUNTER — Emergency Department
Admission: EM | Admit: 2014-10-05 | Discharge: 2014-10-05 | Disposition: A | Discharge status: Home / Self Care | Payer: Self-pay | Attending: Emergency Medicine | Admitting: Emergency Medicine

## 2014-10-05 DIAGNOSIS — B9689 Other specified bacterial agents as the cause of diseases classified elsewhere: Secondary | ICD-10-CM

## 2014-10-05 DIAGNOSIS — N760 Acute vaginitis: Secondary | ICD-10-CM | POA: Insufficient documentation

## 2014-10-05 DIAGNOSIS — Z79899 Other long term (current) drug therapy: Secondary | ICD-10-CM | POA: Insufficient documentation

## 2014-10-05 DIAGNOSIS — Z3202 Encounter for pregnancy test, result negative: Secondary | ICD-10-CM | POA: Insufficient documentation

## 2014-10-05 DIAGNOSIS — N390 Urinary tract infection, site not specified: Secondary | ICD-10-CM | POA: Insufficient documentation

## 2014-10-05 LAB — URINALYSIS COMPLETE WITH MICROSCOPIC (ARMC ONLY)
Bilirubin Urine: NEGATIVE
Glucose, UA: >500 mg/dL — AB
Nitrite: NEGATIVE
PROTEIN: NEGATIVE mg/dL
SPECIFIC GRAVITY, URINE: 1.038 — AB (ref 1.005–1.030)
pH: 5 (ref 5.0–8.0)

## 2014-10-05 LAB — CHLAMYDIA/NGC RT PCR (ARMC ONLY)
Chlamydia Tr: NOT DETECTED
N GONORRHOEAE: NOT DETECTED

## 2014-10-05 LAB — WET PREP, GENITAL
Clue Cells Wet Prep HPF POC: POSITIVE — AB
Trich, Wet Prep: NONE SEEN
Yeast Wet Prep HPF POC: NONE SEEN

## 2014-10-05 LAB — PREGNANCY, URINE: Preg Test, Ur: NEGATIVE

## 2014-10-05 MED ORDER — SULFAMETHOXAZOLE-TRIMETHOPRIM 800-160 MG PO TABS: 2.0000 | ORAL_TABLET | Freq: Two times a day (BID) | ORAL | Status: DC

## 2014-10-05 MED ORDER — METRONIDAZOLE 500 MG PO TABS: 500.0000 mg | ORAL_TABLET | Freq: Two times a day (BID) | ORAL | Status: AC

## 2014-10-05 MED ORDER — SULFAMETHOXAZOLE-TRIMETHOPRIM 800-160 MG PO TABS
1.0000 | ORAL_TABLET | Freq: Once | ORAL | Status: AC
  Administered 2014-10-05: 1 via ORAL
  Filled 2014-10-05: qty 1

## 2014-10-05 MED ORDER — PHENAZOPYRIDINE HCL 200 MG PO TABS: 200.0000 mg | ORAL_TABLET | Freq: Three times a day (TID) | ORAL | Status: DC | PRN

## 2014-10-05 MED ORDER — PHENAZOPYRIDINE HCL 200 MG PO TABS
200.0000 mg | ORAL_TABLET | Freq: Once | ORAL | Status: AC
  Administered 2014-10-05: 200 mg via ORAL
  Filled 2014-10-05: qty 1

## 2014-10-05 NOTE — ED Notes (Signed)
Patient to ED with report of vaginal pain and burning with urination, patient reports pain is all on outside.

## 2014-10-05 NOTE — ED Provider Notes (Signed)
[facility] Emergency Department Provider Note ____________________________________________  Time seen: 1345  I have reviewed the triage vital signs and the nursing notes.  HISTORY  Chief Complaint  Vaginal Pain   HPI ROSLAND [name] is a 54 y.o. female reports to the ED with vaginal irritation, vaginal discharge, and burning with urination for the last week or so. She self treated last week for yeast infection use an over-the-counter vaginal inserts. She denies any significant relief or improvement to her symptoms. She reports today pain to lower abdomen as well as some pain to the left flank. She has also noted some bright red blood when wiping after urination. She denies any fevers, chills, or sweats.  History reviewed. No pertinent past medical history.  There are no active problems to display for this patient.  History reviewed. No pertinent past surgical history.  Current Outpatient Rx  Name  Route  Sig  Dispense  Refill  . metFORMIN (GLUCOPHAGE) 1000 MG tablet   Oral   Take 1,000 mg by mouth 2 (two) times daily with a meal.         . metroNIDAZOLE (FLAGYL) 500 MG tablet   Oral   Take 1 tablet (500 mg total) by mouth 2 (two) times daily.   14 tablet   0   . phenazopyridine (PYRIDIUM) 200 MG tablet   Oral   Take 1 tablet (200 mg total) by mouth 3 (three) times daily as needed for pain.   20 tablet   0   . sulfamethoxazole-trimethoprim (BACTRIM DS,SEPTRA DS) 800-160 MG per tablet   Oral   Take 2 tablets by mouth 2 (two) times daily.   10 tablet   0     Allergies Review of patient's allergies indicates no known allergies.  History reviewed. No pertinent family history.  Social History Social History  Substance Use Topics  . Smoking status: Never Smoker   . Smokeless tobacco: None  . Alcohol Use: No   Review of Systems  Constitutional: Negative for fever. Eyes: Negative for visual changes. ENT: Negative for sore  throat. Cardiovascular: Negative for chest pain. Respiratory: Negative for shortness of breath. Gastrointestinal: Negative for abdominal pain, vomiting and diarrhea. Genitourinary: Positive for dysuria and vaginal discharge Musculoskeletal: Negative for back pain. Skin: Negative for rash. Neurological: Negative for headaches, focal weakness or numbness. ____________________________________________  PHYSICAL EXAM:  VITAL SIGNS: ED Triage Vitals  Enc Vitals Group     BP 10/05/14 1256 120/70 mmHg     Pulse Rate 10/05/14 1256 89     Resp 10/05/14 1256 18     Temp 10/05/14 1256 97.8 F (36.6 C)     Temp Source 10/05/14 1256 Oral     SpO2 10/05/14 1256 97 %     Weight 10/05/14 1256 206 lb (93.441 kg)     Height 10/05/14 1256 5\' 3"  (1.6 m)     Head Cir --      Peak Flow --      Pain Score 10/05/14 1257 9     Pain Loc --      Pain Edu? --      Excl. in [place]? --    Constitutional: Alert and oriented. Well appearing and in no distress. Eyes: Conjunctivae are normal. PERRL. Normal extraocular movements. ENT   Head: Normocephalic and atraumatic.   Nose: No congestion/rhinorrhea.   Mouth/Throat: Mucous membranes are moist.   Neck: Supple. No thyromegaly. Hematological/Lymphatic/Immunological: No cervical lymphadenopathy. Cardiovascular: Normal rate, regular rhythm.  Respiratory: Normal  respiratory effort. No wheezes/rales/rhonchi. Gastrointestinal: Soft and nontender. No distention. GU: Normal external genitalia. Scant white discharge in the vault.  Musculoskeletal: Nontender with normal range of motion in all extremities.  Neurologic:  Normal gait without ataxia. Normal speech and language. No gross focal neurologic deficits are appreciated. Skin:  Skin is warm, dry and intact. No rash noted. Psychiatric: Mood and affect are normal. Patient exhibits appropriate insight and judgment. ____________________________________________    LABS (pertinent  positives/negatives) Labs Reviewed  WET PREP, GENITAL - Abnormal; Notable for the following:    Clue Cells Wet Prep HPF POC POSITIVE (*)    WBC, Wet Prep HPF POC MODERATE (*)    All other components within normal limits  URINALYSIS COMPLETEWITH MICROSCOPIC ([facility] ONLY) - Abnormal; Notable for the following:    Color, Urine YELLOW (*)    APPearance HAZY (*)    Glucose, UA >500 (*)    Ketones, ur 2+ (*)    Specific Gravity, Urine 1.038 (*)    Hgb urine dipstick 1+ (*)    Leukocytes, UA 1+ (*)    Bacteria, UA RARE (*)    Squamous Epithelial / LPF 6-30 (*)    All other components within normal limits  URINE CULTURE  CHLAMYDIA/NGC RT PCR ([facility] ONLY)  PREGNANCY, URINE  ____________________________________________  PROCEDURES  Pyridium 200 mg PO Bactrim DS 1 PO ____________________________________________  INITIAL IMPRESSION / ASSESSMENT AND PLAN / ED COURSE  Treatment for an acute UTI as well as BV. Patient will complete a course of Bactrim DS as well as metronidazole. She'll follow-up with a GYN provider for ongoing symptoms. ____________________________________________  FINAL CLINICAL IMPRESSION(S) / ED DIAGNOSES  Final diagnoses:  UTI (lower urinary tract infection)  BV (bacterial vaginosis)     Melvenia Needles, PA-C 10/05/14 1445  Lisa Roca, MD 10/05/14 1525

## 2014-10-05 NOTE — Discharge Instructions (Signed)
Bacterial Vaginosis Bacterial vaginosis is a vaginal infection that occurs when the normal balance of bacteria in the vagina is disrupted. It results from an overgrowth of certain bacteria. This is the most common vaginal infection in women of childbearing age. Treatment is important to prevent complications, especially in pregnant women, as it can cause a premature delivery. CAUSES  Bacterial vaginosis is caused by an increase in harmful bacteria that are normally present in smaller amounts in the vagina. Several different kinds of bacteria can cause bacterial vaginosis. However, the reason that the condition develops is not fully understood. RISK FACTORS Certain activities or behaviors can put you at an increased risk of developing bacterial vaginosis, including:  Having a new sex partner or multiple sex partners.  Douching.  Using an intrauterine device (IUD) for contraception. Women do not get bacterial vaginosis from toilet seats, bedding, swimming pools, or contact with objects around them. SIGNS AND SYMPTOMS  Some women with bacterial vaginosis have no signs or symptoms. Common symptoms include:  Grey vaginal discharge.  A fishlike odor with discharge, especially after sexual intercourse.  Itching or burning of the vagina and vulva.  Burning or pain with urination. DIAGNOSIS  Your health care provider will take a medical history and examine the vagina for signs of bacterial vaginosis. A sample of vaginal fluid may be taken. Your health care provider will look at this sample under a microscope to check for bacteria and abnormal cells. A vaginal pH test may also be done.  TREATMENT  Bacterial vaginosis may be treated with antibiotic medicines. These may be given in the form of a pill or a vaginal cream. A second round of antibiotics may be prescribed if the condition comes back after treatment.  HOME CARE INSTRUCTIONS   Only take over-the-counter or prescription medicines as  directed by your health care provider.  If antibiotic medicine was prescribed, take it as directed. Make sure you finish it even if you start to feel better.  Do not have sex until treatment is completed.  Tell all sexual partners that you have a vaginal infection. They should see their health care provider and be treated if they have problems, such as a mild rash or itching.  Practice safe sex by using condoms and only having one sex partner. SEEK MEDICAL CARE IF:   Your symptoms are not improving after 3 days of treatment.  You have increased discharge or pain.  You have a fever. MAKE SURE YOU:   Understand these instructions.  Will watch your condition.  Will get help right away if you are not doing well or get worse. FOR MORE INFORMATION  Centers for Disease Control and Prevention, Division of STD Prevention: AppraiserFraud.fi American Sexual Health Association (ASHA): www.ashastd.org  Document Released: 01/07/2005 Document Revised: 10/28/2012 Document Reviewed: 08/19/2012 [facility] Patient Information 2015 [place], [place]. This information is not intended to replace advice given to you by your health care provider. Make sure you discuss any questions you have with your health care provider.  Urinary Tract Infection Urinary tract infections (UTIs) can develop anywhere along your urinary tract. Your urinary tract is your body's drainage system for removing wastes and extra water. Your urinary tract includes two kidneys, two ureters, a bladder, and a urethra. Your kidneys are a pair of bean-shaped organs. Each kidney is about the size of your fist. They are located below your ribs, one on each side of your spine. CAUSES Infections are caused by microbes, which are microscopic organisms, including fungi, viruses,  and bacteria. These organisms are so small that they can only be seen through a microscope. Bacteria are the microbes that most commonly cause UTIs. SYMPTOMS  Symptoms of  UTIs may vary by age and gender of the patient and by the location of the infection. Symptoms in young women typically include a frequent and intense urge to urinate and a painful, burning feeling in the bladder or urethra during urination. Older women and men are more likely to be tired, shaky, and weak and have muscle aches and abdominal pain. A fever may mean the infection is in your kidneys. Other symptoms of a kidney infection include pain in your back or sides below the ribs, nausea, and vomiting. DIAGNOSIS To diagnose a UTI, your caregiver will ask you about your symptoms. Your caregiver also will ask to provide a urine sample. The urine sample will be tested for bacteria and white blood cells. White blood cells are made by your body to help fight infection. TREATMENT  Typically, UTIs can be treated with medication. Because most UTIs are caused by a bacterial infection, they usually can be treated with the use of antibiotics. The choice of antibiotic and length of treatment depend on your symptoms and the type of bacteria causing your infection. HOME CARE INSTRUCTIONS  If you were prescribed antibiotics, take them exactly as your caregiver instructs you. Finish the medication even if you feel better after you have only taken some of the medication.  Drink enough water and fluids to keep your urine clear or pale yellow.  Avoid caffeine, tea, and carbonated beverages. They tend to irritate your bladder.  Empty your bladder often. Avoid holding urine for long periods of time.  Empty your bladder before and after sexual intercourse.  After a bowel movement, women should cleanse from front to back. Use each tissue only once. SEEK MEDICAL CARE IF:   You have back pain.  You develop a fever.  Your symptoms do not begin to resolve within 3 days. SEEK IMMEDIATE MEDICAL CARE IF:   You have severe back pain or lower abdominal pain.  You develop chills.  You have nausea or vomiting.  You  have continued burning or discomfort with urination. MAKE SURE YOU:   Understand these instructions.  Will watch your condition.  Will get help right away if you are not doing well or get worse. Document Released: 10/17/2004 Document Revised: 07/09/2011 Document Reviewed: 02/15/2011 [facility] Patient Information 2015 [place], [place]. This information is not intended to replace advice given to you by your health care provider. Make sure you discuss any questions you have with your health care provider.  Take the prescription medicines until completely gone. Increase fluid intake and take regular bathroom breaks. Follow-up with [facility] as needed.

## 2014-10-06 LAB — URINE CULTURE
CULTURE: NO GROWTH
SPECIAL REQUESTS: NORMAL

## 2014-10-11 ENCOUNTER — Ambulatory Visit: Payer: Self-pay

## 2014-11-15 ENCOUNTER — Ambulatory Visit: Payer: Self-pay

## 2014-11-16 ENCOUNTER — Ambulatory Visit: Payer: Self-pay | Admitting: Specialist

## 2014-11-17 ENCOUNTER — Ambulatory Visit: Payer: Self-pay | Admitting: Ophthalmology

## 2014-12-22 ENCOUNTER — Other Ambulatory Visit: Payer: Self-pay

## 2014-12-22 ENCOUNTER — Telehealth: Payer: Self-pay | Admitting: Gastroenterology

## 2014-12-22 NOTE — Telephone Encounter (Signed)
Left voice message for patient to call and schedule appointment with Dr. Allen Norris for blood in stool. Notes under media

## 2014-12-29 ENCOUNTER — Ambulatory Visit: Payer: Self-pay

## 2015-01-03 ENCOUNTER — Other Ambulatory Visit: Payer: Self-pay

## 2015-01-10 ENCOUNTER — Ambulatory Visit: Payer: Self-pay

## 2015-01-27 ENCOUNTER — Other Ambulatory Visit: Payer: Self-pay

## 2015-01-27 ENCOUNTER — Telehealth: Payer: Self-pay | Admitting: Gastroenterology

## 2015-01-27 DIAGNOSIS — E785 Hyperlipidemia, unspecified: Secondary | ICD-10-CM

## 2015-01-27 DIAGNOSIS — I10 Essential (primary) hypertension: Secondary | ICD-10-CM

## 2015-01-27 DIAGNOSIS — K219 Gastro-esophageal reflux disease without esophagitis: Secondary | ICD-10-CM | POA: Insufficient documentation

## 2015-01-27 HISTORY — DX: Essential (primary) hypertension: I10

## 2015-01-27 HISTORY — DX: Gastro-esophageal reflux disease without esophagitis: K21.9

## 2015-01-27 HISTORY — DX: Hyperlipidemia, unspecified: E78.5

## 2015-01-27 NOTE — Telephone Encounter (Signed)
Patient called and wanted to make sure he visit on Monday 01/30/2015 was covered with her Financial asst through [facility]. I called financial assistant at the hospital [phone] and spoke to [employer] and got verification patient was covered for anything with [facility] at 100% until march 1st 2017

## 2015-01-30 ENCOUNTER — Ambulatory Visit: Payer: Self-pay | Admitting: Gastroenterology

## 2015-02-07 ENCOUNTER — Ambulatory Visit: Payer: Self-pay

## 2015-02-13 ENCOUNTER — Ambulatory Visit: Payer: Self-pay | Admitting: Gastroenterology

## 2015-02-15 ENCOUNTER — Ambulatory Visit: Payer: Self-pay | Admitting: Ophthalmology

## 2015-02-21 ENCOUNTER — Ambulatory Visit: Payer: Self-pay | Admitting: Gastroenterology

## 2015-02-22 ENCOUNTER — Ambulatory Visit: Payer: Self-pay | Admitting: Ophthalmology

## 2015-03-02 ENCOUNTER — Ambulatory Visit: Payer: Self-pay

## 2015-03-09 ENCOUNTER — Ambulatory Visit: Payer: Self-pay

## 2015-04-04 ENCOUNTER — Encounter: Payer: Self-pay | Admitting: Gastroenterology

## 2015-04-04 ENCOUNTER — Other Ambulatory Visit: Payer: Self-pay

## 2015-04-04 ENCOUNTER — Ambulatory Visit (INDEPENDENT_AMBULATORY_CARE_PROVIDER_SITE_OTHER): Payer: Self-pay | Admitting: Gastroenterology

## 2015-04-04 VITALS — BP 138/80 | HR 80 | Temp 98.3°F | Ht 63.0 in | Wt 197.8 lb

## 2015-04-04 DIAGNOSIS — K921 Melena: Secondary | ICD-10-CM

## 2015-04-04 NOTE — Progress Notes (Signed)
Gastroenterology Consultation  Referring Provider:     No ref. provider found Primary Care Physician:  No PCP Per Patient Primary Gastroenterologist:  Dr. Allen Norris     Reason for Consultation:     hematochezia        HPI:   Angelica [name] is a 55 y.o. y/o female referred for consultation & management of hematochezia by Dr. Rayne Du PCP Per Patient.  This patient comes in today with a history of some unintentional weight loss and hematochezia. The patient reports that she lost her brother at the age of 70 from colon cancer. The patient has never had a colonoscopy. The patient also reports that she has been having intermittent rectal bleeding. There is no report of any chronic abdominal pain but she states she started to have some abdominal pain after starting her insulin earlier this year. She states that when she gives herself injections. She denies any change in bowel habits but states that she has been constipated her whole life. The patient was having unexplained weight loss but states she has recently started gaining weight again when she started the insulin. There is no report of any nausea or vomiting. She also denies any black stools.  Past Medical History  Diagnosis Date  . Type 2 diabetes mellitus ([place]) 08/04/2014  . Bell palsy 08/04/2014  . GERD (gastroesophageal reflux disease) 01/27/2015  . Hypertension 01/27/2015  . Hyperlipidemia 01/27/2015    Past Surgical History  Procedure Laterality Date  . Oophorectomy Right 2005    Prior to Admission medications   Medication Sig Start Date End Date Taking? Authorizing Provider  aspirin EC 81 MG tablet Take 81 mg by mouth. 08/04/14 08/04/15 Yes Historical Provider, MD  atorvastatin (LIPITOR) 40 MG tablet Take 40 mg by mouth. 08/04/14 08/04/15 Yes Historical Provider, MD  gabapentin (NEURONTIN) 100 MG capsule Take 100 mg by mouth. 07/19/14 07/19/15 Yes Historical Provider, MD  hydrochlorothiazide (HYDRODIURIL) 12.5 MG tablet Take 12.5 mg by mouth.  07/19/14 07/19/15 Yes Historical Provider, MD  Insulin Glargine (TOUJEO SOLOSTAR) 300 UNIT/ML SOPN Inject into the skin. 12/28/14 12/28/15 Yes Historical Provider, MD  lisinopril (PRINIVIL,ZESTRIL) 10 MG tablet Take 10 mg by mouth. 12/21/14 12/21/15 Yes Historical Provider, MD  meloxicam (MOBIC) 7.5 MG tablet Take 7.5 mg by mouth. 12/21/14 12/21/15 Yes Historical Provider, MD  metFORMIN (GLUCOPHAGE) 1000 MG tablet Take 1,000 mg by mouth 2 (two) times daily with a meal.   Yes Historical Provider, MD  omeprazole (PRILOSEC) 20 MG capsule Take 20 mg by mouth. 12/21/14 12/21/15 Yes Historical Provider, MD  ARTIFICIAL TEARS 0.1-0.3 % SOLN Apply to eye. Reported on 04/04/2015 07/07/14   Historical Provider, MD    Family History  Problem Relation Age of Onset  . Cancer Brother      Social History  Substance Use Topics  . Smoking status: Never Smoker   . Smokeless tobacco: Never Used  . Alcohol Use: No    Allergies as of 04/04/2015  . (No Known Allergies)    Review of Systems:    All systems reviewed and negative except where noted in HPI.   Physical Exam:  BP 138/80 mmHg  Pulse 80  Temp(Src) 98.3 F (36.8 C) (Oral)  Ht 5\' 3"  (1.6 m)  Wt 197 lb 12.8 oz (89.721 kg)  BMI 35.05 kg/m2 No LMP recorded. Patient is postmenopausal. Psych:  Alert and cooperative. Normal mood and affect. General:   Alert,  Well-developed, well-nourished, pleasant and cooperative in NAD Head:  Normocephalic and  atraumatic. Eyes:  Sclera clear, no icterus.   Conjunctiva pink. Ears:  Normal auditory acuity. Nose:  No deformity, discharge, or lesions. Mouth:  No deformity or lesions,oropharynx pink & moist. Neck:  Supple; no masses or thyromegaly. Lungs:  Respirations even and unlabored.  Clear throughout to auscultation.   No wheezes, crackles, or rhonchi. No acute distress. Heart:  Regular rate and rhythm; no murmurs, clicks, rubs, or gallops. Abdomen:  Normal bowel sounds.  No bruits.  Soft, non-tender and  non-distended without masses, hepatosplenomegaly or hernias noted.  No guarding or rebound tenderness.  Negative Carnett sign.   Rectal:  Deferred.  Msk:  Symmetrical without gross deformities.  Good, equal movement & strength bilaterally. Pulses:  Normal pulses noted. Extremities:  No clubbing or edema.  No cyanosis. Neurologic:  Alert and oriented x3;  grossly normal neurologically. Skin:  Intact without significant lesions or rashes.  No jaundice. Lymph Nodes:  No significant cervical adenopathy. Psych:  Alert and cooperative. Normal mood and affect.  Imaging Studies: No results found.  Assessment and Plan:   [name] is a 55 y.o. y/o female who has a family history of colon cancer in a 31 year old brother who died from colon cancer. The patient also has some rectal bleeding and had some weight loss that came back up after she started insulin for her diabetes. The patient will be set up for a colonoscopy due to her rectal bleeding and family history of colon cancer. The patient's constipation has been chronic and if the colonoscopy is normal then she should try treating this with increased fiber in her diet. I have discussed risks & benefits which include, but are not limited to, bleeding, infection, perforation & drug reaction.  The patient agrees with this plan & written consent will be obtained.     Note: This dictation was prepared with Dragon dictation along with smaller phrase technology. Any transcriptional errors that result from this process are unintentional.

## 2015-04-05 ENCOUNTER — Ambulatory Visit: Payer: Self-pay | Admitting: Internal Medicine

## 2015-04-11 ENCOUNTER — Encounter: Payer: Self-pay | Admitting: *Deleted

## 2015-04-12 ENCOUNTER — Other Ambulatory Visit: Payer: Self-pay

## 2015-04-12 ENCOUNTER — Encounter: Payer: Self-pay | Admitting: Anesthesiology

## 2015-04-12 ENCOUNTER — Institutional Professional Consult (permissible substitution): Payer: Self-pay | Admitting: Specialist

## 2015-04-12 DIAGNOSIS — M25561 Pain in right knee: Principal | ICD-10-CM

## 2015-04-12 DIAGNOSIS — M25361 Other instability, right knee: Secondary | ICD-10-CM

## 2015-04-17 ENCOUNTER — Ambulatory Visit
Admission: RE | Admit: 2015-04-17 | Discharge: 2015-04-17 | Disposition: A | Discharge status: Home / Self Care | Payer: Self-pay | Source: Ambulatory Visit | Attending: Specialist | Admitting: Specialist

## 2015-04-19 ENCOUNTER — Telehealth: Payer: Self-pay | Admitting: Gastroenterology

## 2015-04-19 ENCOUNTER — Ambulatory Visit: Payer: Self-pay

## 2015-04-19 ENCOUNTER — Ambulatory Visit: Payer: Self-pay | Attending: Oncology

## 2015-04-19 ENCOUNTER — Ambulatory Visit: Payer: Self-pay | Admitting: Internal Medicine

## 2015-04-19 ENCOUNTER — Other Ambulatory Visit: Payer: Self-pay

## 2015-04-19 ENCOUNTER — Ambulatory Visit
Admission: RE | Admit: 2015-04-19 | Discharge: 2015-04-19 | Disposition: A | Discharge status: Home / Self Care | Payer: Self-pay | Source: Ambulatory Visit | Attending: Oncology | Admitting: Oncology

## 2015-04-19 VITALS — BP 119/80 | HR 79 | Temp 98.8°F | Ht 64.57 in | Wt 197.5 lb

## 2015-04-19 DIAGNOSIS — Z0000 Encounter for general adult medical examination without abnormal findings: Secondary | ICD-10-CM

## 2015-04-19 IMAGING — MG MM DIGITAL SCREENING BILAT W/ TOMO W/ CAD
8 of 13 series · 8 of 29 positions shown · non-contrast
Comparison: Previous exam(s).

CLINICAL DATA: Screening.

EXAM:
2D DIGITAL SCREENING BILATERAL MAMMOGRAM WITH CAD AND ADJUNCT TOMO

[L MLO (1 of 2)]
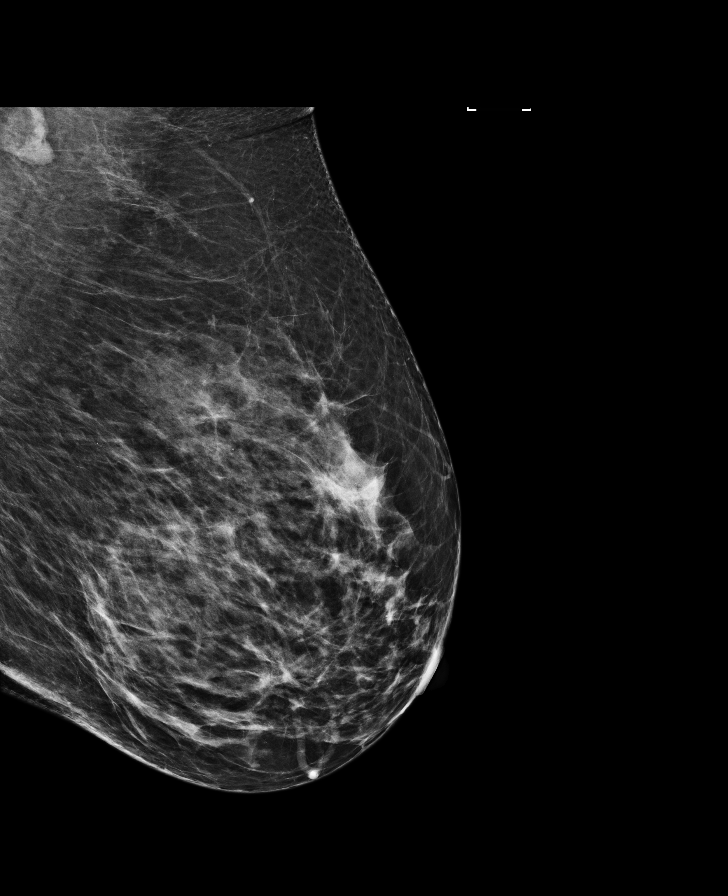

[R MLO synth-2D]
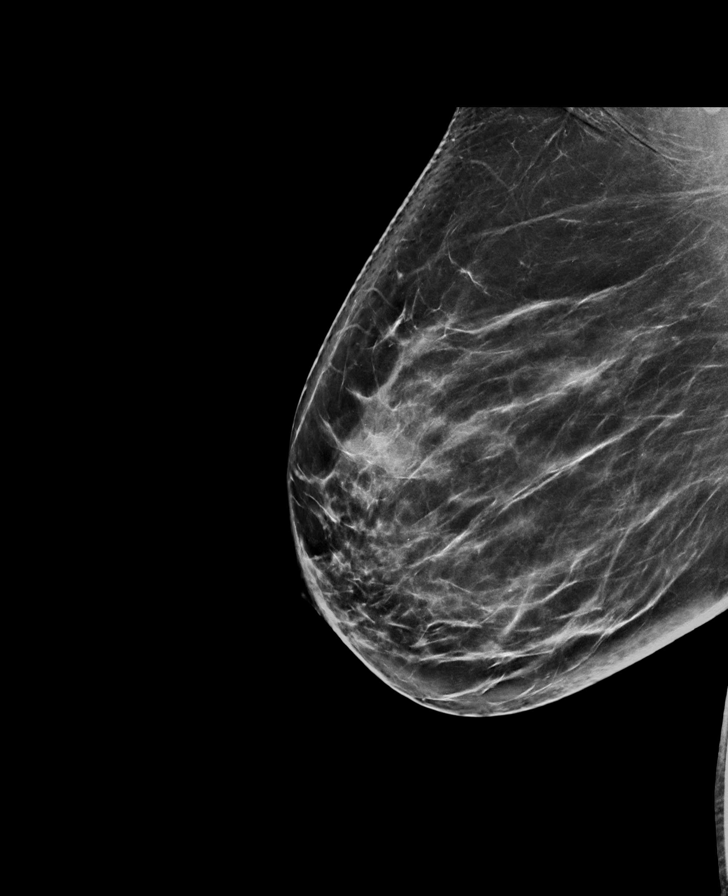

[R CC]
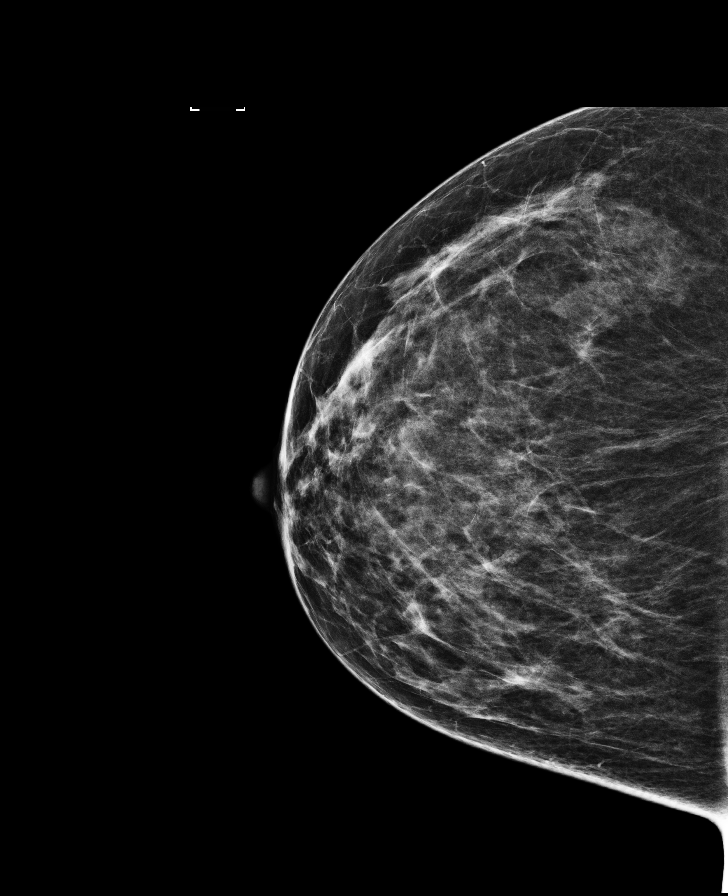

[R CC synth-2D]
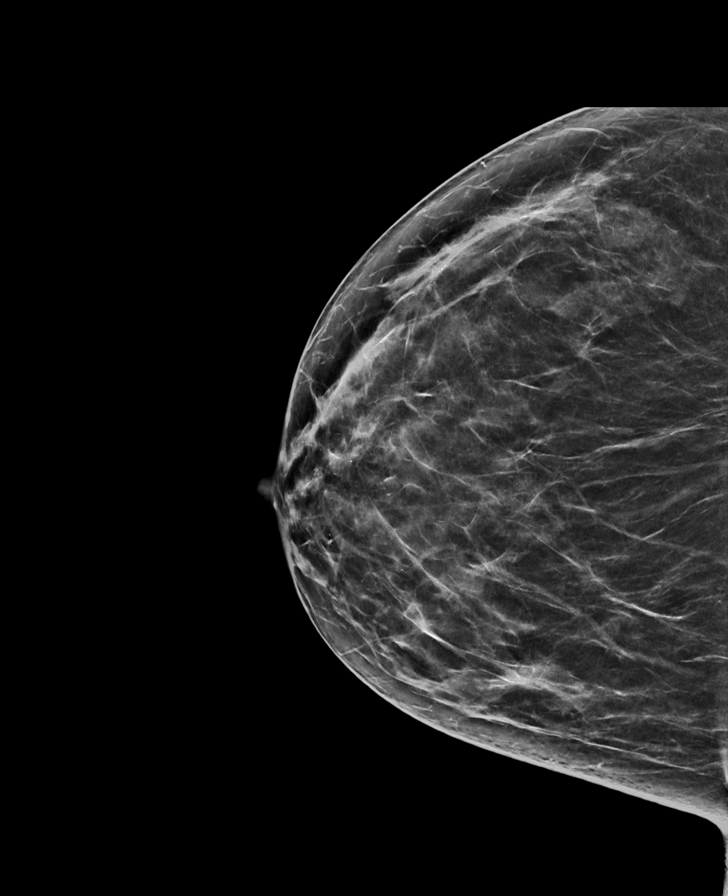

[L MLO (2 of 2)]
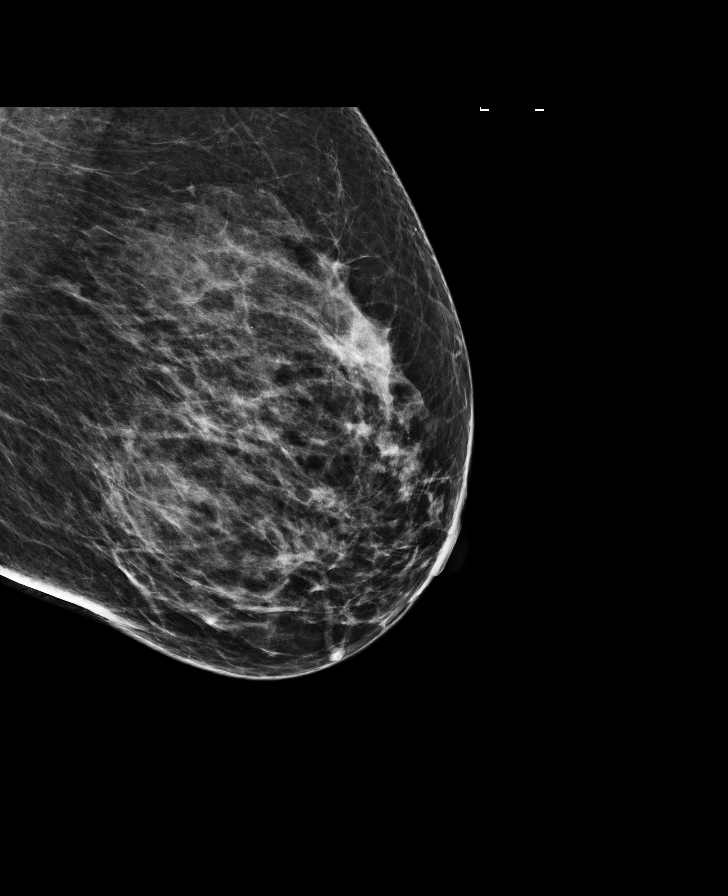

[L CC synth-2D]
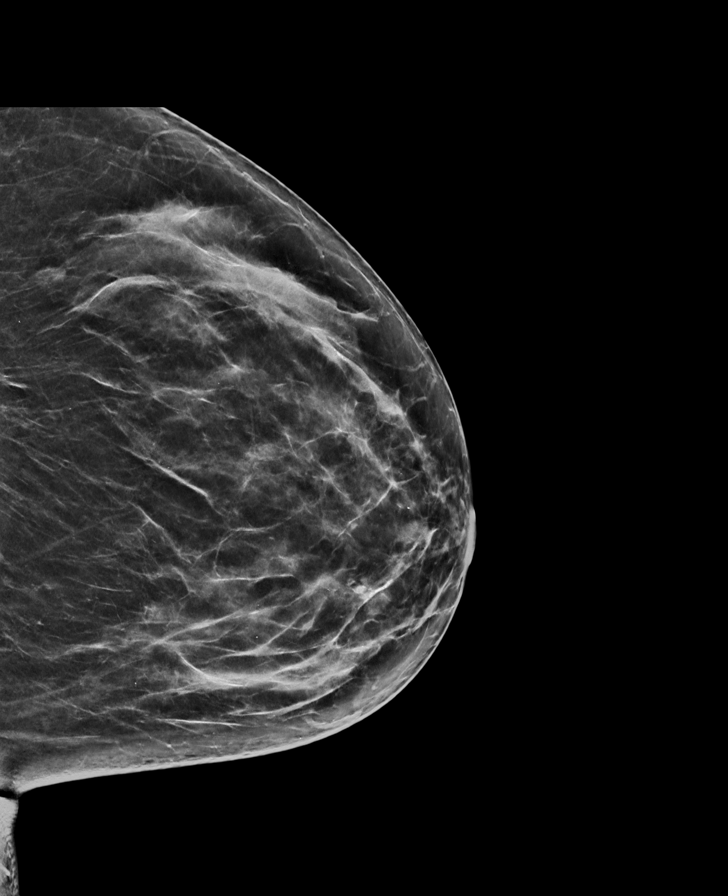

[L CC]
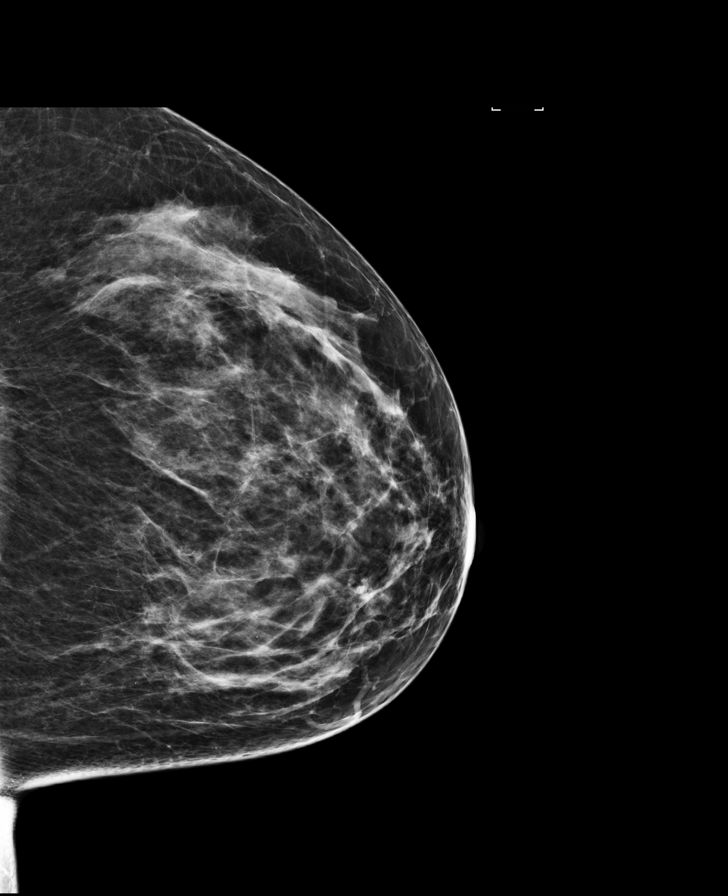

[R MLO]
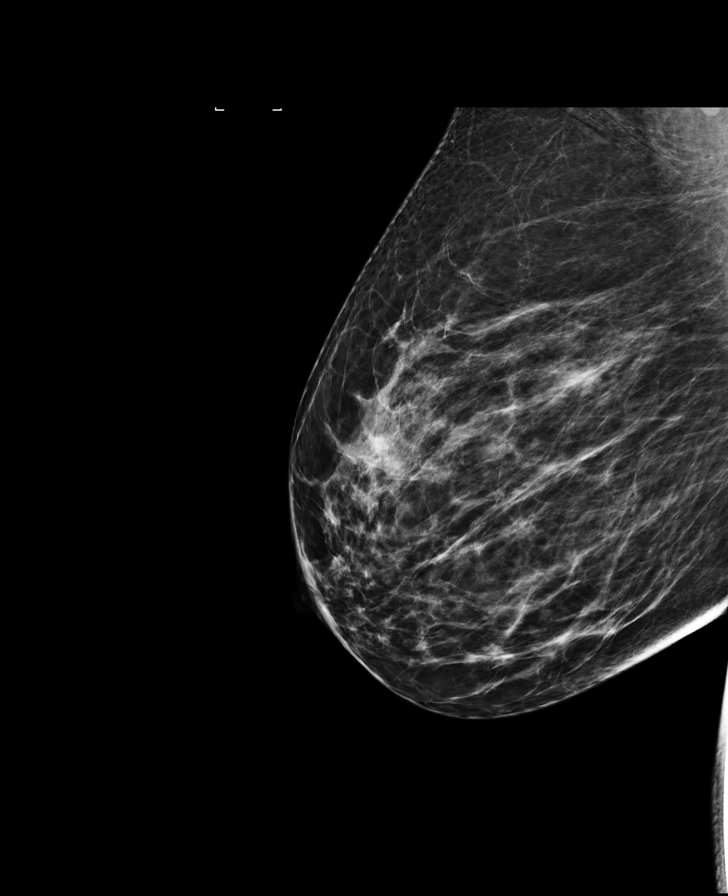

[8 of 29 positions shown; findings below may reference images not displayed]

ACR Breast Density Category c: The breast tissue is heterogeneously
dense, which may obscure small masses.
FINDINGS: There are no findings suspicious for malignancy. Images were
processed with CAD.
IMPRESSION: No mammographic evidence of malignancy. A result letter of this
screening mammogram will be mailed directly to the patient.

RECOMMENDATION:
Screening mammogram in one year. (Code:[TA])

BI-RADS CATEGORY  1: Negative.

## 2015-04-19 NOTE — Progress Notes (Signed)
Subjective:     Patient ID: Angelica [name], female   DOB: Apr 13, 1960, 55 y.o.   MRN: [id number]  HPI   Review of Systems     Objective:   Physical Exam  Pulmonary/Chest: Right breast exhibits no inverted nipple, no mass, no nipple discharge, no skin change and no tenderness. Left breast exhibits no inverted nipple, no mass, no nipple discharge, no skin change and no tenderness. Breasts are symmetrical.       Assessment:     55 year old female patient presents for [place] clinic visit.  Patient screened, and meets BCCCP eligibility.  Patient does not have insurance, Medicare or Medicaid.  Handout given on Affordable Care Act. Instructed patient on breast self-exam using teach back method. CBE unremarkable. No mass or lump palpated. Pelvic exam normal. Hd normal pap with negative HPV in 2015.  Patient has left ovary, and supracervical hysterectomy performed at [facility].    Plan:     Sent for bilateral screening  Mammogram.

## 2015-04-19 NOTE — Telephone Encounter (Signed)
Patient needs to cancel colonoscopy for tomorrow. No ride. Please call her to reschedule

## 2015-04-19 NOTE — Telephone Encounter (Signed)
Pt rescheduled for screening colonoscopy to 04/27/15. [facility] notified.

## 2015-04-19 NOTE — Discharge Instructions (Signed)

## 2015-04-20 NOTE — Progress Notes (Signed)
Letter mailed from [facility] to notify of normal mammogram results.  Patient to return in one year for annual screening.  Copy to [facility]. 

## 2015-04-26 ENCOUNTER — Ambulatory Visit: Payer: Self-pay | Admitting: Internal Medicine

## 2015-04-26 ENCOUNTER — Encounter: Payer: Self-pay | Admitting: Internal Medicine

## 2015-04-26 VITALS — BP 123/75 | HR 69 | Temp 97.9°F | Wt 197.0 lb

## 2015-04-26 DIAGNOSIS — Z794 Long term (current) use of insulin: Principal | ICD-10-CM

## 2015-04-26 DIAGNOSIS — E119 Type 2 diabetes mellitus without complications: Secondary | ICD-10-CM

## 2015-04-26 LAB — GLUCOSE, POCT (MANUAL RESULT ENTRY): POC Glucose: 117 mg/dl — AB (ref 70–99)

## 2015-04-26 MED ORDER — HYDROCHLOROTHIAZIDE 12.5 MG PO TABS: 12.5000 mg | ORAL_TABLET | Freq: Two times a day (BID) | ORAL | Status: DC

## 2015-04-26 NOTE — Progress Notes (Signed)
   Subjective:    Patient ID: Angelica [name], female    DOB: [birth year], 55 y.o.   MRN: [id number]  HPI Pt present today with diabetes. Pt checks sugars every morning , taking metformin Pt presents today with hypertension still taking bp meds\  Patient Active Problem List   Diagnosis Date Noted  . GERD (gastroesophageal reflux disease) 01/27/2015  . Hypertension 01/27/2015  . Hyperlipidemia 01/27/2015  . Bell palsy 08/04/2014  . Type 2 diabetes mellitus ([place]) 08/04/2014    Review of Systems     Objective:   Physical Exam  Constitutional: She is oriented to person, place, and time.  Cardiovascular: Normal rate and regular rhythm.   Pulmonary/Chest: Effort normal and breath sounds normal.  Neurological: She is alert and oriented to person, place, and time.    BP 123/75 mmHg  Pulse 69  Temp(Src) 97.9 F (36.6 C)  Wt 197 lb (89.359 kg)  Blood glucose 117    Medication List       This list is accurate as of: 04/26/15 10:53 AM.  Always use your most recent med list.               ARTIFICIAL TEARS 0.1-0.3 % Soln  Apply to eye. Reported on 04/04/2015     aspirin EC 81 MG tablet  Take 81 mg by mouth.     hydrochlorothiazide 12.5 MG tablet  Commonly known as:  HYDRODIURIL  Take 12.5 mg by mouth.     LIPITOR 40 MG tablet  Generic drug:  atorvastatin  Take 40 mg by mouth.     lisinopril 10 MG tablet  Commonly known as:  PRINIVIL,ZESTRIL  Take 10 mg by mouth daily.     metFORMIN 1000 MG tablet  Commonly known as:  GLUCOPHAGE  Take 1,000 mg by mouth 2 (two) times daily with a meal.     MOBIC 7.5 MG tablet  Generic drug:  meloxicam  Take 7.5 mg by mouth daily.     NEURONTIN 100 MG capsule  Generic drug:  gabapentin  Take 100 mg by mouth. Reported on 04/26/2015     PRILOSEC 20 MG capsule  Generic drug:  omeprazole  Take 20 mg by mouth daily.     TOUJEO SOLOSTAR 300 UNIT/ML Sopn  Generic drug:  Insulin Glargine  Inject 10 Units into the skin daily.           Assessment & Plan:  Pt is having a conlonoscopy tomorrow   Pt neds to return to clinic   Cbc,metc lipid panel, ua return to clinic in 6 months

## 2015-04-27 ENCOUNTER — Ambulatory Visit: Admission: RE | Admit: 2015-04-27 | Payer: Self-pay | Source: Ambulatory Visit | Admitting: Gastroenterology

## 2015-04-27 ENCOUNTER — Encounter: Admission: RE | Payer: Self-pay | Source: Ambulatory Visit

## 2015-04-27 SURGERY — COLONOSCOPY WITH PROPOFOL
Anesthesia: Choice

## 2015-04-28 ENCOUNTER — Ambulatory Visit
Admission: RE | Admit: 2015-04-28 | Discharge: 2015-04-28 | Disposition: A | Discharge status: Home / Self Care | Payer: PRIVATE HEALTH INSURANCE | Source: Ambulatory Visit | Attending: Specialist | Admitting: Specialist

## 2015-04-28 DIAGNOSIS — M1711 Unilateral primary osteoarthritis, right knee: Secondary | ICD-10-CM | POA: Insufficient documentation

## 2015-04-28 DIAGNOSIS — M25561 Pain in right knee: Principal | ICD-10-CM

## 2015-04-28 DIAGNOSIS — M25361 Other instability, right knee: Secondary | ICD-10-CM

## 2015-04-28 IMAGING — CR DG KNEE COMPLETE 4+V*R*
4 series · 4 of 4 positions shown · non-contrast
Comparison: [DATE]

CLINICAL DATA: Right knee pain, no known injury, initial encounter

EXAM:
RIGHT KNEE - COMPLETE 4+ VIEW

[knee ap (1 of 2)]
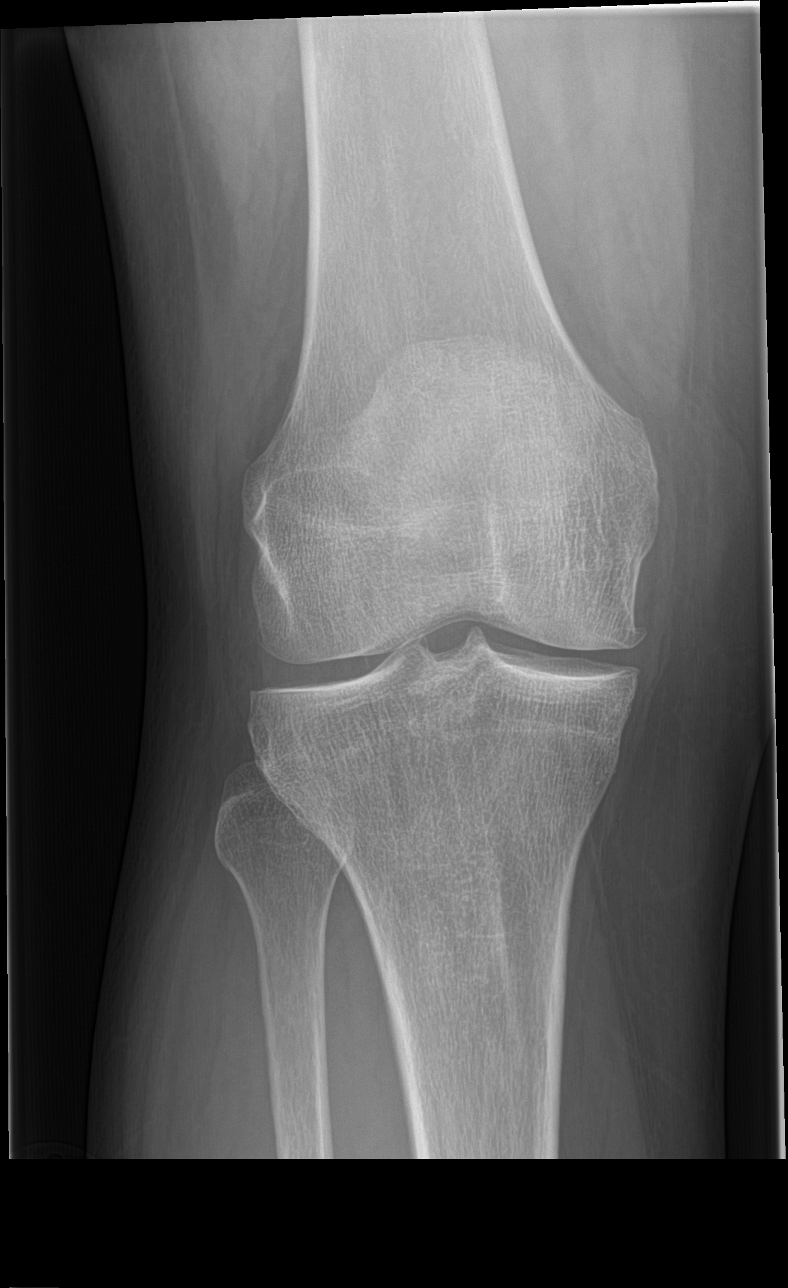

[knee lat]
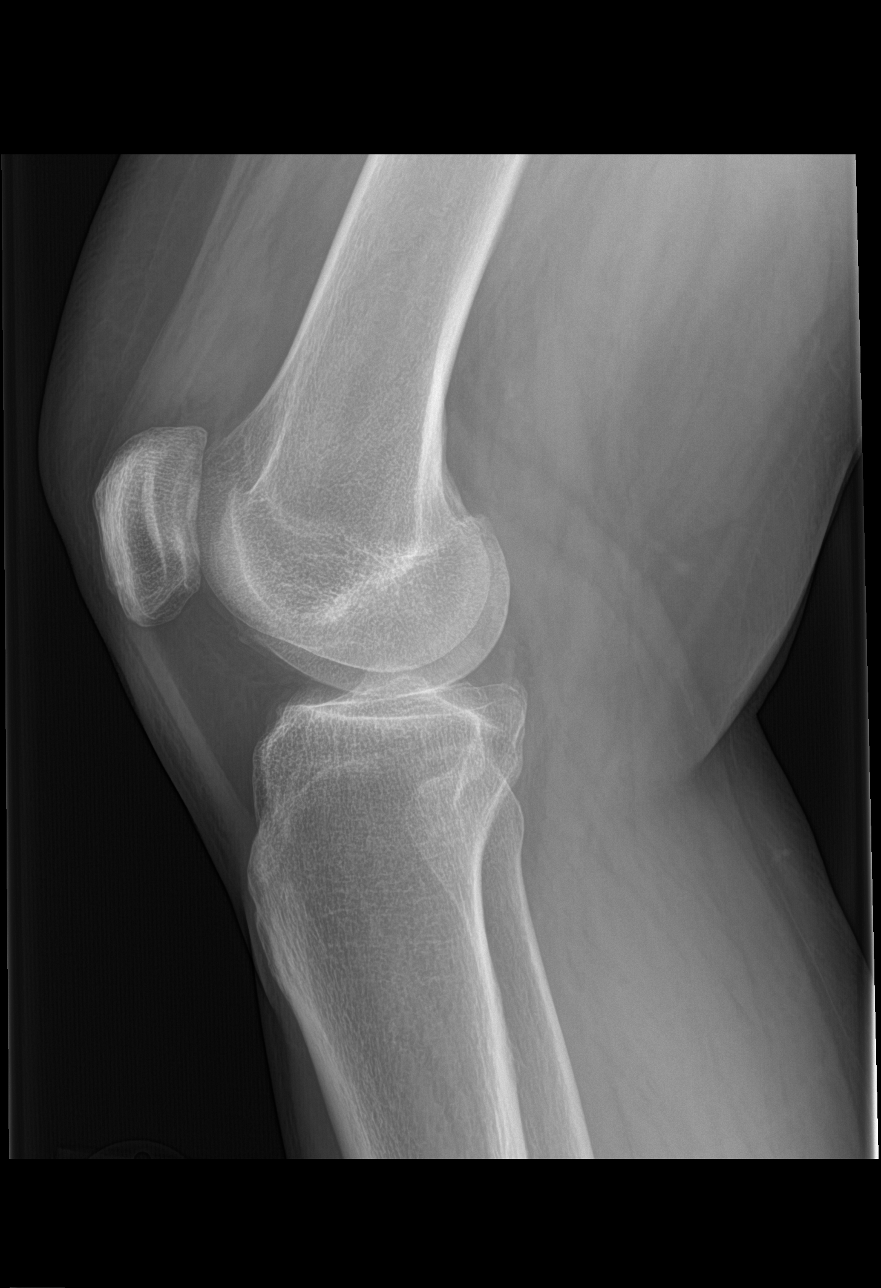

[sunrise]
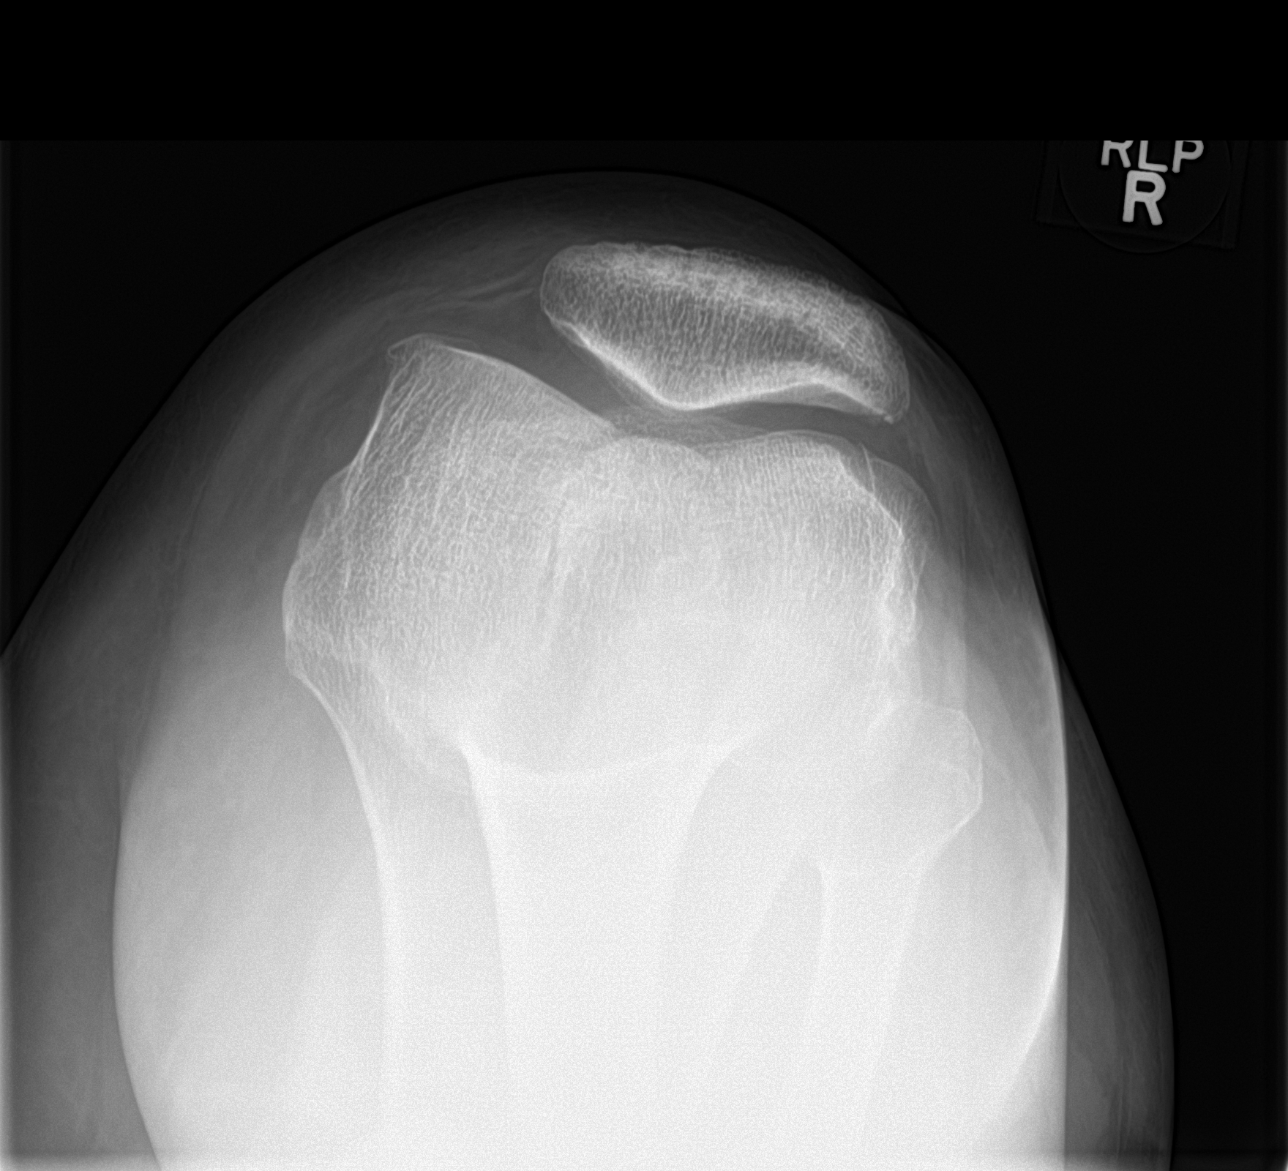

[knee ap (2 of 2)]
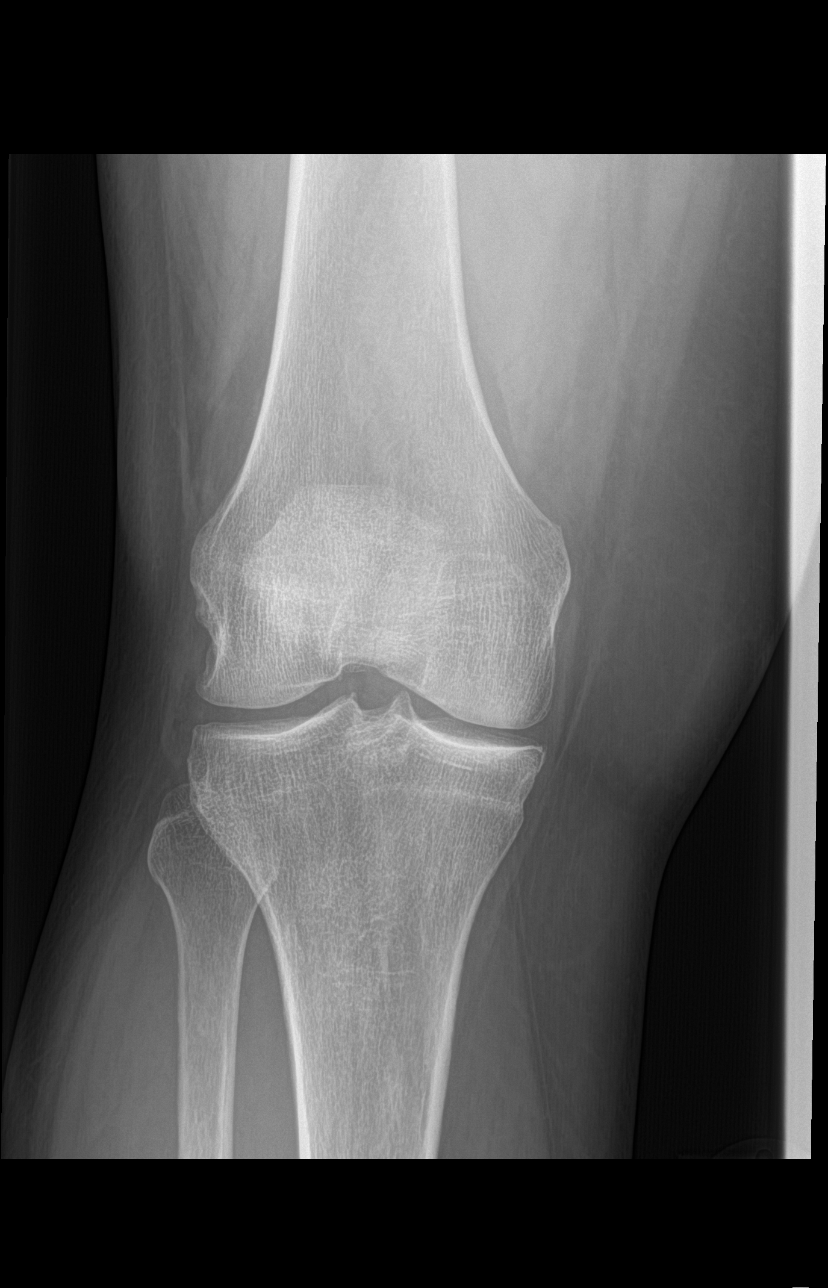

[4 of 4 positions shown; findings below may reference images not displayed]

FINDINGS: Mild joint space narrowing is noted medially with mild osteophytic
change. No joint effusion is seen. No acute fracture or dislocation
is noted.
IMPRESSION: Mild degenerative change without acute abnormality.

## 2015-05-02 ENCOUNTER — Telehealth: Payer: Self-pay

## 2015-05-02 NOTE — Telephone Encounter (Signed)
Patient called, needs to make an appointment with Dr.  Vickki Hearing

## 2015-05-02 NOTE — Telephone Encounter (Signed)
Called patient and made an appointment for Dr. Baxter Flattery on April 19 at 10:30 am

## 2015-05-10 ENCOUNTER — Encounter: Payer: Self-pay | Admitting: Specialist

## 2015-05-10 ENCOUNTER — Institutional Professional Consult (permissible substitution): Payer: Self-pay | Admitting: Specialist

## 2015-05-23 ENCOUNTER — Ambulatory Visit: Payer: Self-pay

## 2015-06-07 ENCOUNTER — Ambulatory Visit: Payer: Self-pay | Admitting: Internal Medicine

## 2015-06-07 ENCOUNTER — Other Ambulatory Visit: Payer: Self-pay

## 2015-06-07 DIAGNOSIS — E119 Type 2 diabetes mellitus without complications: Secondary | ICD-10-CM

## 2015-06-07 DIAGNOSIS — E785 Hyperlipidemia, unspecified: Secondary | ICD-10-CM

## 2015-06-07 DIAGNOSIS — Z794 Long term (current) use of insulin: Principal | ICD-10-CM

## 2015-06-07 DIAGNOSIS — I10 Essential (primary) hypertension: Secondary | ICD-10-CM

## 2015-06-07 LAB — GLUCOSE, POCT (MANUAL RESULT ENTRY): POC GLUCOSE: 150 mg/dL — AB (ref 70–99)

## 2015-06-07 NOTE — Progress Notes (Signed)
Subjective:    Patient ID: Angelica [name], female    DOB: 02-Jul-1960, 55 y.o.   MRN: [id number]  HPI  Patient Active Problem List   Diagnosis Date Noted  . GERD (gastroesophageal reflux disease) 01/27/2015  . Hypertension 01/27/2015  . Hyperlipidemia 01/27/2015  . Bell palsy 08/04/2014  . Type 2 diabetes mellitus ([place]) 08/04/2014   Pt claims she has been doing well. Blood pressure is low this morning.  Pt presents with a f/u for diabetes. Pt checks sugars twice a week. She checks before meals and "whenever she feels like it." Claims to have been ranging in the 90s. Last night she ate a bag of popcorn in the night and breakfast in the morning.  Pt has continuous headaches. Pt sweats. Pt wants to stay in a cool dark place. Pt claims that blood sugar is never over 150 during these episodes.    Review of Systems  Neurological: Positive for headaches.       Objective:   Physical Exam  BP 125/79 mmHg  Pulse 87  Temp(Src) 98.1 F (36.7 C)  Wt 187 lb (84.823 kg)   Blood glucose was 150  Outpatient Encounter Prescriptions as of 06/07/2015  Medication Sig Note  . aspirin EC 81 MG tablet Take 81 mg by mouth. 01/27/2015: Received from: [facility]  . atorvastatin (LIPITOR) 40 MG tablet Take 40 mg by mouth. 01/27/2015: Received from: [facility]  . gabapentin (NEURONTIN) 100 MG capsule Take 100 mg by mouth. Reported on 04/26/2015 01/27/2015: Received from: [facility]  . hydrochlorothiazide (HYDRODIURIL) 12.5 MG tablet Take 1 tablet (12.5 mg total) by mouth 2 (two) times daily.   . Insulin Glargine (TOUJEO SOLOSTAR) 300 UNIT/ML SOPN Inject 10 Units into the skin daily.  01/27/2015: Received from: [facility]  . lisinopril (PRINIVIL,ZESTRIL) 10 MG tablet Take 10 mg by mouth daily.  01/27/2015: Received from: [facility]  . meloxicam (MOBIC) 7.5 MG tablet Take 7.5 mg by mouth daily.  01/27/2015: Received from: [facility]  . metFORMIN (GLUCOPHAGE) 1000 MG tablet Take 1,000  mg by mouth 2 (two) times daily with a meal.   . omeprazole (PRILOSEC) 20 MG capsule Take 20 mg by mouth daily.  01/27/2015: Received from: [facility]  . ARTIFICIAL TEARS 0.1-0.3 % SOLN Apply to eye. Reported on 06/07/2015 01/27/2015: Received from: [facility]   No facility-administered encounter medications on file as of 06/07/2015.         Assessment & Plan:  BP is stable. Weight has decreased which is good for diabetes.   Met C, CBC, Sed rate, AIc, UA, Lipid, TSh today.   MD f/u in 6 months with repeat of above labs.

## 2015-06-08 LAB — CBC WITH DIFFERENTIAL/PLATELET
Basophils Absolute: 0 10*3/uL (ref 0.0–0.2)
Basos: 0 %
EOS (ABSOLUTE): 0.1 10*3/uL (ref 0.0–0.4)
EOS: 1 %
HEMATOCRIT: 40 % (ref 34.0–46.6)
Hemoglobin: 13.2 g/dL (ref 11.1–15.9)
Immature Grans (Abs): 0 10*3/uL (ref 0.0–0.1)
Immature Granulocytes: 0 %
LYMPHS ABS: 3.1 10*3/uL (ref 0.7–3.1)
Lymphs: 39 %
MCH: 27 pg (ref 26.6–33.0)
MCHC: 33 g/dL (ref 31.5–35.7)
MCV: 82 fL (ref 79–97)
MONOS ABS: 0.4 10*3/uL (ref 0.1–0.9)
Monocytes: 5 %
NEUTROS ABS: 4.5 10*3/uL (ref 1.4–7.0)
Neutrophils: 55 %
Platelets: 387 10*3/uL — ABNORMAL HIGH (ref 150–379)
RBC: 4.89 x10E6/uL (ref 3.77–5.28)
RDW: 14.5 % (ref 12.3–15.4)
WBC: 8.2 10*3/uL (ref 3.4–10.8)

## 2015-06-08 LAB — COMPREHENSIVE METABOLIC PANEL
ALBUMIN: 4.5 g/dL (ref 3.5–5.5)
ALK PHOS: 125 IU/L — AB (ref 39–117)
ALT: 26 IU/L (ref 0–32)
AST: 18 IU/L (ref 0–40)
Albumin/Globulin Ratio: 1.3 (ref 1.2–2.2)
BILIRUBIN TOTAL: 0.3 mg/dL (ref 0.0–1.2)
BUN / CREAT RATIO: 13 (ref 9–23)
BUN: 10 mg/dL (ref 6–24)
CHLORIDE: 101 mmol/L (ref 96–106)
CO2: 29 mmol/L (ref 18–29)
CREATININE: 0.8 mg/dL (ref 0.57–1.00)
Calcium: 10.2 mg/dL (ref 8.7–10.2)
GFR calc Af Amer: 98 mL/min/{1.73_m2} (ref >59)
GFR calc non Af Amer: 85 mL/min/{1.73_m2} (ref >59)
GLOBULIN, TOTAL: 3.6 g/dL (ref 1.5–4.5)
Glucose: 117 mg/dL — ABNORMAL HIGH (ref 65–99)
Potassium: 4 mmol/L (ref 3.5–5.2)
SODIUM: 145 mmol/L — AB (ref 134–144)
Total Protein: 8.1 g/dL (ref 6.0–8.5)

## 2015-06-08 LAB — URINALYSIS
Bilirubin, UA: NEGATIVE
GLUCOSE, UA: NEGATIVE
Ketones, UA: NEGATIVE
Leukocytes, UA: NEGATIVE
Nitrite, UA: NEGATIVE
PH UA: 5.5 (ref 5.0–7.5)
PROTEIN UA: NEGATIVE
RBC, UA: NEGATIVE
Specific Gravity, UA: 1.024 (ref 1.005–1.030)
Urobilinogen, Ur: 0.2 mg/dL (ref 0.2–1.0)

## 2015-06-08 LAB — SEDIMENTATION RATE: SED RATE: 17 mm/h (ref 0–40)

## 2015-06-08 LAB — HEMOGLOBIN A1C
ESTIMATED AVERAGE GLUCOSE: 154 mg/dL
HEMOGLOBIN A1C: 7 % — AB (ref 4.8–5.6)

## 2015-06-08 LAB — TSH: TSH: 1.24 u[IU]/mL (ref 0.450–4.500)

## 2015-07-20 ENCOUNTER — Ambulatory Visit: Payer: Self-pay | Admitting: Obstetrics & Gynecology

## 2015-07-20 DIAGNOSIS — N951 Menopausal and female climacteric states: Secondary | ICD-10-CM | POA: Insufficient documentation

## 2015-07-20 NOTE — Progress Notes (Signed)
Pt seen for consult regarding menopausal sx's (although she did not really know why she was here)    Menopause 2 years, no bleeding.  No Incontinence. No breast sx's.  No pain.    Hot flashes severe for 1 year;  Treats by fans and removing clothes.    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    Sexually active, 17 years w same partner, no complaints, no dyspareunia or dryness.  Discussed pros and cons of treatment for hot flashes, not a life threatening concern but can affect quality of life    Option one- increase exercise and lose weight. Studies show this is best treatment for hot flashes    Option 2- Black cohash or OTC regimen with this ingredient.  Pt desires to try this approach.    Option 3- hormonal therpay (Rx), pros and cons discussed.    Option 4- Other meds, like gabapentin, clonidine, SSRI.   Pt to monitor sx's and return if need for Rx therapy arises. MMG UTD (April), PAP UTD (January, q 3 years).

## 2015-07-21 ENCOUNTER — Other Ambulatory Visit: Payer: Self-pay | Admitting: Nurse Practitioner

## 2015-09-06 ENCOUNTER — Encounter: Payer: Self-pay | Admitting: Internal Medicine

## 2015-09-06 ENCOUNTER — Ambulatory Visit: Payer: Self-pay | Admitting: Internal Medicine

## 2015-09-06 VITALS — BP 136/99 | HR 85 | Temp 98.6°F | Wt 206.0 lb

## 2015-09-06 DIAGNOSIS — Z794 Long term (current) use of insulin: Principal | ICD-10-CM

## 2015-09-06 DIAGNOSIS — E119 Type 2 diabetes mellitus without complications: Secondary | ICD-10-CM

## 2015-09-06 LAB — GLUCOSE, POCT (MANUAL RESULT ENTRY): POC Glucose: 152 mg/dl — AB (ref 70–99)

## 2015-09-06 NOTE — Patient Instructions (Signed)
F/u in 2 weeks  Labs today: A1C

## 2015-09-06 NOTE — Progress Notes (Signed)
   Subjective:    Patient ID: Angelica [name], female    DOB: February 10, 1960, 55 y.o.   MRN: [id number]  HPI  Pt presents today w/ f/u for diabetes and headache  Glucose is elevated at 152 Pt complains of severe headaches due to lisinopril, breaks out in sweat with headaches, Pt reports headaches are in afternoon and lasts for a few hours Pt also reports symptoms of menopause  Pt reports checking glucose several times a week in the morning   Patient Active Problem List   Diagnosis Date Noted  . Menopausal hot flushes 07/20/2015  . GERD (gastroesophageal reflux disease) 01/27/2015  . Hypertension 01/27/2015  . Hyperlipidemia 01/27/2015  . Bell palsy 08/04/2014  . Type 2 diabetes mellitus ([place]) 08/04/2014     Medication List       Accurate as of 09/06/15  9:17 AM. Always use your most recent med list.          ARTIFICIAL TEARS 0.1-0.3 % Soln Apply to eye. Reported on 06/07/2015   hydrochlorothiazide 12.5 MG tablet Commonly known as:  HYDRODIURIL Take 1 tablet (12.5 mg total) by mouth 2 (two) times daily.   LIPITOR 40 MG tablet Generic drug:  atorvastatin Take 40 mg by mouth.   lisinopril 10 MG tablet Commonly known as:  PRINIVIL,ZESTRIL TAKE ONE TABLET BY MOUTH EVERY DAY.   metFORMIN 1000 MG tablet Commonly known as:  GLUCOPHAGE Take 1,000 mg by mouth 2 (two) times daily with a meal.   MOBIC 7.5 MG tablet Generic drug:  meloxicam Take 7.5 mg by mouth daily.   NEURONTIN 100 MG capsule Generic drug:  gabapentin Take 100 mg by mouth. Reported on 04/26/2015   PRILOSEC 20 MG capsule Generic drug:  omeprazole Take 20 mg by mouth daily.   TOUJEO SOLOSTAR 300 UNIT/ML Sopn Generic drug:  Insulin Glargine Inject 10 Units into the skin daily.        Review of Systems  BP rechecked to 136/99    Objective:   Physical Exam  Constitutional: She is oriented to person, place, and time.  Cardiovascular: Normal rate and regular rhythm.   Pulmonary/Chest: Effort normal  and breath sounds normal.  Neurological: She is alert and oriented to person, place, and time.    BP (!) 136/99   Pulse 85   Temp 98.6 F (37 C)   Wt 206 lb (93.4 kg)   BMI 34.74 kg/m        Assessment & Plan:  Headaches may be due to low blood sugar Cause currently unclear Pt encouraged to lose weight and check blood sugar more frequently   Pt f/u in 2 weeks  Labs today: A1C Discontinue glipzide, lisinopril

## 2015-09-07 LAB — HEMOGLOBIN A1C
Est. average glucose Bld gHb Est-mCnc: 174 mg/dL
Hgb A1c MFr Bld: 7.7 % — ABNORMAL HIGH (ref 4.8–5.6)

## 2015-09-12 ENCOUNTER — Emergency Department: Payer: Self-pay

## 2015-09-12 ENCOUNTER — Emergency Department
Admission: EM | Admit: 2015-09-12 | Discharge: 2015-09-12 | Disposition: A | Discharge status: Home / Self Care | Payer: Self-pay | Attending: Emergency Medicine | Admitting: Emergency Medicine

## 2015-09-12 ENCOUNTER — Encounter: Payer: Self-pay | Admitting: *Deleted

## 2015-09-12 DIAGNOSIS — I10 Essential (primary) hypertension: Secondary | ICD-10-CM | POA: Insufficient documentation

## 2015-09-12 DIAGNOSIS — E119 Type 2 diabetes mellitus without complications: Secondary | ICD-10-CM | POA: Insufficient documentation

## 2015-09-12 DIAGNOSIS — Z7984 Long term (current) use of oral hypoglycemic drugs: Secondary | ICD-10-CM | POA: Insufficient documentation

## 2015-09-12 DIAGNOSIS — M5442 Lumbago with sciatica, left side: Secondary | ICD-10-CM | POA: Insufficient documentation

## 2015-09-12 DIAGNOSIS — Z794 Long term (current) use of insulin: Secondary | ICD-10-CM | POA: Insufficient documentation

## 2015-09-12 DIAGNOSIS — M5432 Sciatica, left side: Secondary | ICD-10-CM

## 2015-09-12 IMAGING — CR DG LUMBAR SPINE 2-3V
1 series · 3 of 3 positions shown · non-contrast
Comparison: [DATE]

CLINICAL DATA: Low back pain

EXAM:
LUMBAR SPINE - 2-3 VIEW

[Series 2: t lumbar spine ap · 0.14mm/px · 3 of 3 slices shown]
[im 1/3]
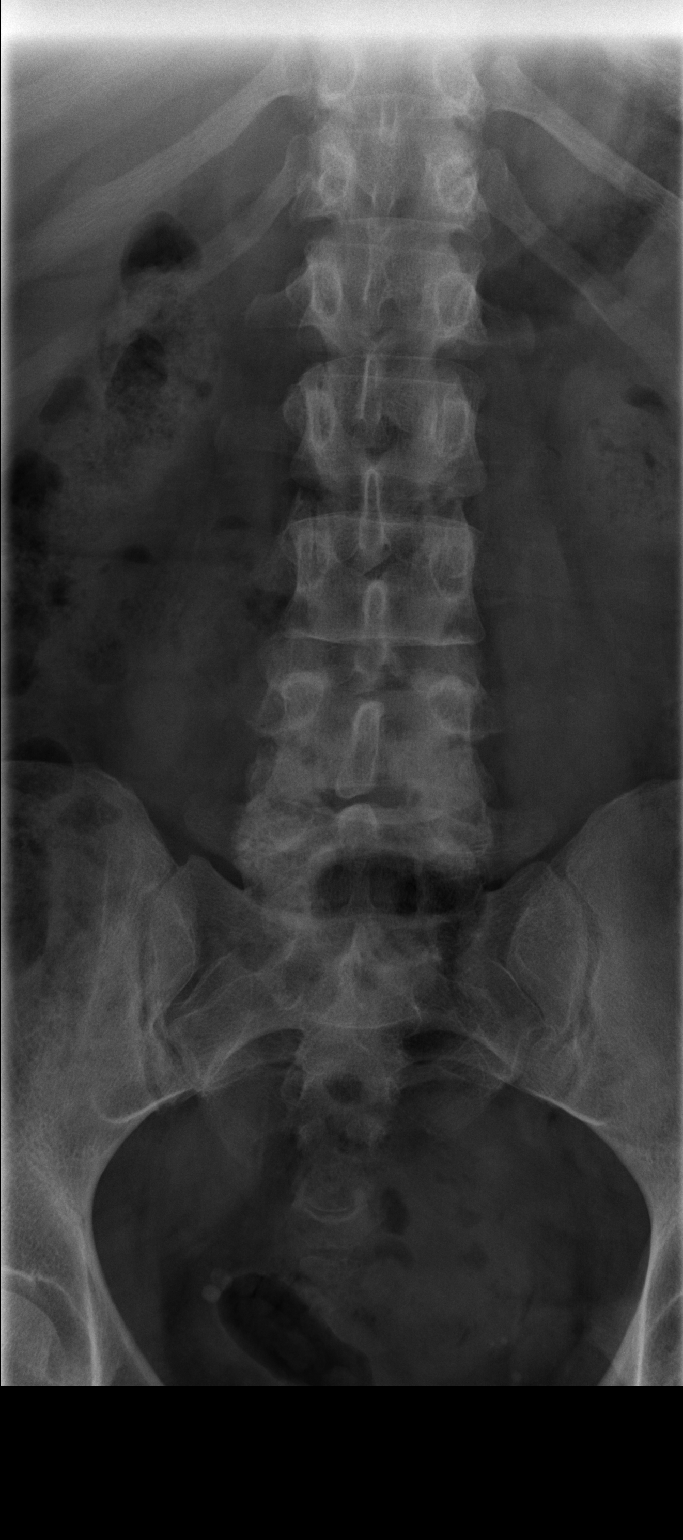
[im 2/3]
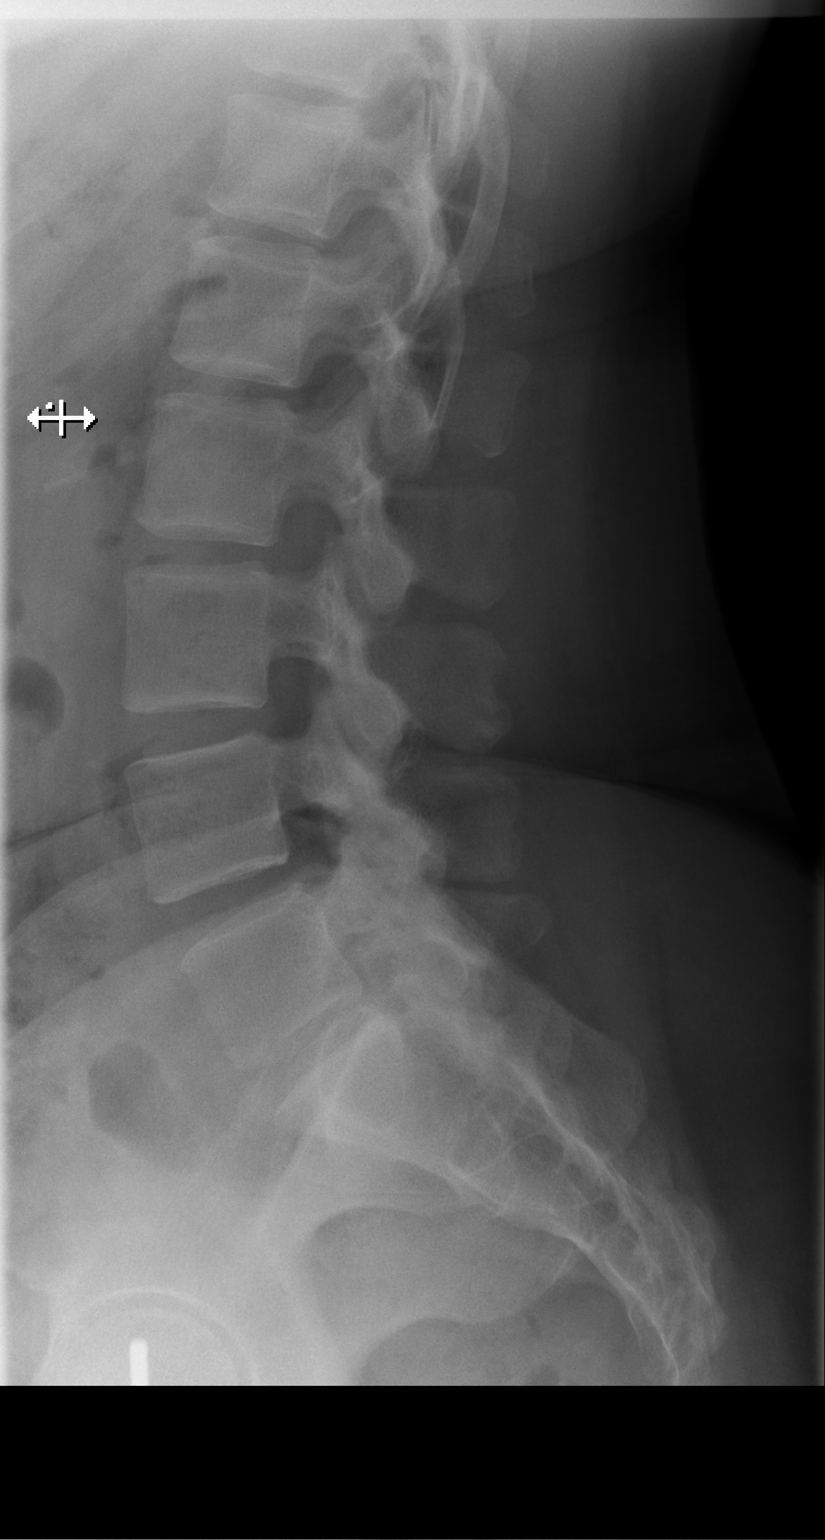
[im 3/3]
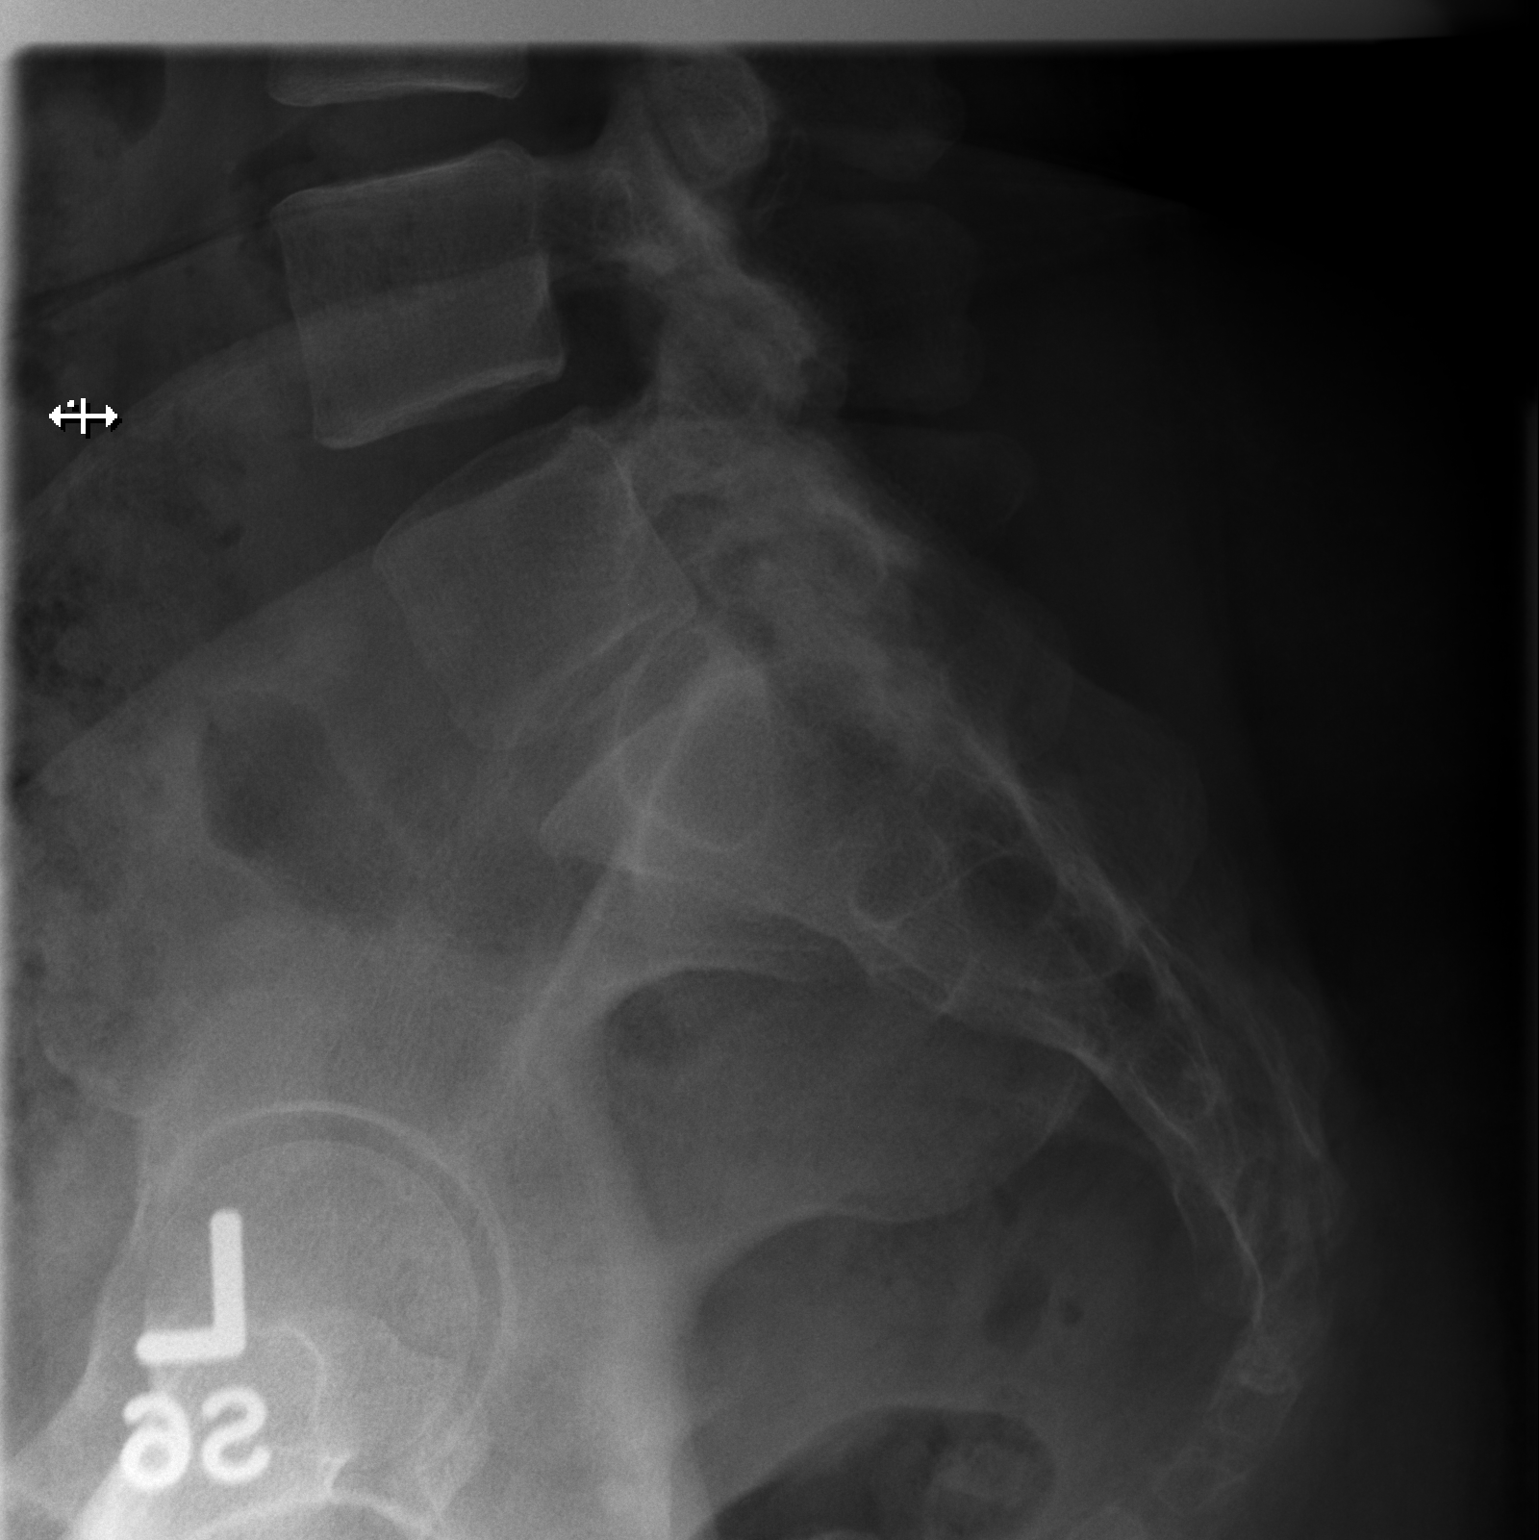

[3 of 3 positions shown; findings below may reference images not displayed]

FINDINGS: There is no evidence of lumbar spine fracture. Alignment is normal.
Intervertebral disc spaces are maintained.
IMPRESSION: Negative.

## 2015-09-12 MED ORDER — DICLOFENAC SODIUM 75 MG PO TBEC: 75.0000 mg | DELAYED_RELEASE_TABLET | Freq: Two times a day (BID) | ORAL | 0 refills | Status: DC

## 2015-09-12 MED ORDER — CYCLOBENZAPRINE HCL 10 MG PO TABS: 10.0000 mg | ORAL_TABLET | Freq: Every day | ORAL | 0 refills | Status: DC

## 2015-09-12 NOTE — ED Notes (Signed)
States she developed pain to left lower back and radiates into left leg  Denies any injury  Ambulates slowly d/t pain

## 2015-09-12 NOTE — ED Provider Notes (Signed)
[facility] Provider Note   CSN: [id number] Arrival date & time: 09/12/15  1007     History   Chief Complaint Chief Complaint  Patient presents with  . Back Pain    HPI Angelica [name] is a 55 y.o. female.  55 year old females presents today complaining of let sided low back pain radiating into her hip and down her leg. Has a history of the same when her sciatica flares up. Most recent episode started over the weekend. Has occasional tingling/numbness in the leg, no weakness. Taking aleve over the counter without relief. Has never had back surgery, no PCP    The history is provided by the patient.  Back Pain   This is a recurrent problem. The current episode started more than 1 week ago. The problem occurs constantly. The problem has been gradually worsening. The pain is associated with no known injury. The pain is present in the lumbar spine and gluteal region. The quality of the pain is described as shooting. The pain radiates to the left thigh and left knee. The pain is moderate. The symptoms are aggravated by certain positions, twisting and bending. The pain is worse during the night. Associated symptoms include paresthesias and tingling. Pertinent negatives include no numbness, no bowel incontinence, no perianal numbness, no bladder incontinence, no paresis and no weakness. She has tried NSAIDs for the symptoms. The treatment provided mild relief. Risk factors include obesity, lack of exercise, menopause and a sedentary lifestyle.    Past Medical History:  Diagnosis Date  . Bell palsy 08/04/2014  . GERD (gastroesophageal reflux disease) 01/27/2015  . History of Bell's palsy June 2016  . Hyperlipidemia 01/27/2015  . Hypertension 01/27/2015  . Type 2 diabetes mellitus ([place]) 08/04/2014    Patient Active Problem List   Diagnosis Date Noted  . Menopausal hot flushes 07/20/2015  . GERD (gastroesophageal reflux disease) 01/27/2015  . Hypertension 01/27/2015  .  Hyperlipidemia 01/27/2015  . Bell palsy 08/04/2014  . Type 2 diabetes mellitus ([place]) 08/04/2014    Past Surgical History:  Procedure Laterality Date  . OOPHORECTOMY Right 2005    OB History    No data available       Home Medications    Prior to Admission medications   Medication Sig Start Date End Date Taking? Authorizing Provider  ARTIFICIAL TEARS 0.1-0.3 % SOLN Apply to eye. Reported on 06/07/2015 07/07/14   Historical Provider, MD  atorvastatin (LIPITOR) 40 MG tablet Take 40 mg by mouth. 08/04/14 08/04/15  Historical Provider, MD  cyclobenzaprine (FLEXERIL) 10 MG tablet Take 1 tablet (10 mg total) by mouth at bedtime. 09/12/15   Harvest Dark, PA-C  diclofenac (VOLTAREN) 75 MG EC tablet Take 1 tablet (75 mg total) by mouth 2 (two) times daily. 09/12/15   Harvest Dark, PA-C  gabapentin (NEURONTIN) 100 MG capsule Take 100 mg by mouth. Reported on 04/26/2015 07/19/14 07/19/15  Historical Provider, MD  hydrochlorothiazide (HYDRODIURIL) 12.5 MG tablet Take 1 tablet (12.5 mg total) by mouth 2 (two) times daily. Patient taking differently: Take 12.5 mg by mouth daily.  04/26/15 04/25/16  Tawni Millers, MD  Insulin Glargine (TOUJEO SOLOSTAR) 300 UNIT/ML SOPN Inject 10 Units into the skin daily.  12/28/14 12/28/15  Historical Provider, MD  metFORMIN (GLUCOPHAGE) 1000 MG tablet Take 1,000 mg by mouth 2 (two) times daily with a meal.    Historical Provider, MD    Family History Family History  Problem Relation Age of Onset  . Cancer  Brother     Colon Cancer  . Breast cancer Neg Hx     Social History Social History  Substance Use Topics  . Smoking status: Never Smoker  . Smokeless tobacco: Never Used  . Alcohol use No     Allergies   Review of patient's allergies indicates no known allergies.   Review of Systems Review of Systems  Gastrointestinal: Negative for bowel incontinence.  Genitourinary: Negative for bladder incontinence.  Musculoskeletal: Positive for arthralgias, back  pain and myalgias. Negative for gait problem.  Neurological: Positive for tingling and paresthesias. Negative for weakness and numbness.  All other systems reviewed and are negative.    Physical Exam Updated Vital Signs BP 128/85 (BP Location: Right Arm)   Pulse 72   Temp 97.7 F (36.5 C)   Resp 20   Ht 5\' 3"  (1.6 m)   Wt 93.4 kg   SpO2 96%   BMI 36.49 kg/m   Physical Exam  Constitutional: She is oriented to person, place, and time. She appears well-developed and well-nourished.  HENT:  Head: Normocephalic and atraumatic.  Musculoskeletal: She exhibits tenderness.       Right hip: Normal.       Left hip: Normal.       Lumbar back: She exhibits tenderness and bony tenderness. She exhibits normal range of motion.       Back:  Neurological: She is alert and oriented to person, place, and time. She has normal strength and normal reflexes. She displays normal reflexes. She exhibits normal muscle tone. Gait normal.  +SLR on the left at 15 degrees. Negative SLR on the right.   Skin: Skin is warm and dry.  Psychiatric: She has a normal mood and affect. Her behavior is normal. Judgment and thought content normal.  Nursing note and vitals reviewed.    ED Treatments / Results  Labs (all labs ordered are listed, but only abnormal results are displayed) Labs Reviewed - No data to display  EKG  EKG Interpretation None       Radiology Dg Lumbar Spine 2-3 Views  Result Date: 09/12/2015 CLINICAL DATA:  Low back pain EXAM: LUMBAR SPINE - 2-3 VIEW COMPARISON:  07/15/2012 FINDINGS: There is no evidence of lumbar spine fracture. Alignment is normal. Intervertebral disc spaces are maintained. IMPRESSION: Negative. Electronically Signed   By: Franchot Gallo M.D.   On: 09/12/2015 10:51    Procedures Procedures (including critical care time)  Medications Ordered in ED Medications - No data to display   Initial Impression / Assessment and Plan / ED Course  I have reviewed the  triage vital signs and the nursing notes.  Pertinent labs & imaging results that were available during my care of the patient were reviewed by me and considered in my medical decision making (see chart for details).  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 Lumbar spine xray to evaluate DDD. No red flag warning symptoms indicating need for MRI   Final Clinical Impressions(s) / ED Diagnoses   Final diagnoses:  Sciatica of left side  I independently reviewed and interpreted lumbar spine xray and there is no evidence of compression fracture, minimal degenerative change Treat with voltaren BId with food Moist heat to back and stretching Flexeril QHS Follow up with PCP at [facility] or ortho if no improvement   New Prescriptions New Prescriptions   CYCLOBENZAPRINE (FLEXERIL) 10 MG TABLET    Take 1 tablet (10 mg total) by mouth at bedtime.  DICLOFENAC (VOLTAREN) 75 MG EC TABLET    Take 1 tablet (75 mg total) by mouth 2 (two) times daily.     Harvest Dark, PA-C 09/12/15 1055    Earleen Newport, MD 09/12/15 [phone]

## 2015-09-12 NOTE — ED Triage Notes (Signed)
Pt complains of low back pain with pain radiating left leg

## 2015-09-14 ENCOUNTER — Ambulatory Visit: Payer: Self-pay | Admitting: Ophthalmology

## 2015-09-18 ENCOUNTER — Encounter: Payer: Self-pay | Admitting: Emergency Medicine

## 2015-09-18 ENCOUNTER — Emergency Department
Admission: EM | Admit: 2015-09-18 | Discharge: 2015-09-18 | Disposition: A | Discharge status: Home / Self Care | Payer: Self-pay | Attending: Student | Admitting: Student

## 2015-09-18 ENCOUNTER — Emergency Department: Payer: Self-pay

## 2015-09-18 DIAGNOSIS — Y9389 Activity, other specified: Secondary | ICD-10-CM | POA: Insufficient documentation

## 2015-09-18 DIAGNOSIS — S39012A Strain of muscle, fascia and tendon of lower back, initial encounter: Secondary | ICD-10-CM | POA: Insufficient documentation

## 2015-09-18 DIAGNOSIS — Z7984 Long term (current) use of oral hypoglycemic drugs: Secondary | ICD-10-CM | POA: Insufficient documentation

## 2015-09-18 DIAGNOSIS — Z794 Long term (current) use of insulin: Secondary | ICD-10-CM | POA: Insufficient documentation

## 2015-09-18 DIAGNOSIS — Y999 Unspecified external cause status: Secondary | ICD-10-CM | POA: Insufficient documentation

## 2015-09-18 DIAGNOSIS — Y92410 Unspecified street and highway as the place of occurrence of the external cause: Secondary | ICD-10-CM | POA: Insufficient documentation

## 2015-09-18 DIAGNOSIS — S20219A Contusion of unspecified front wall of thorax, initial encounter: Secondary | ICD-10-CM | POA: Insufficient documentation

## 2015-09-18 DIAGNOSIS — Z79899 Other long term (current) drug therapy: Secondary | ICD-10-CM | POA: Insufficient documentation

## 2015-09-18 DIAGNOSIS — I10 Essential (primary) hypertension: Secondary | ICD-10-CM | POA: Insufficient documentation

## 2015-09-18 DIAGNOSIS — S8001XA Contusion of right knee, initial encounter: Secondary | ICD-10-CM | POA: Insufficient documentation

## 2015-09-18 DIAGNOSIS — E119 Type 2 diabetes mellitus without complications: Secondary | ICD-10-CM | POA: Insufficient documentation
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
## 2015-09-18 MED ORDER — CYCLOBENZAPRINE HCL 5 MG PO TABS: 5.0000 mg | ORAL_TABLET | Freq: Three times a day (TID) | ORAL | 0 refills | Status: DC | PRN

## 2015-09-18 MED ORDER — IBUPROFEN 800 MG PO TABS
800.0000 mg | ORAL_TABLET | Freq: Once | ORAL | Status: AC
  Administered 2015-09-18: 800 mg via ORAL

## 2015-09-18 MED ORDER — NAPROXEN 500 MG PO TABS: 500.0000 mg | ORAL_TABLET | Freq: Two times a day (BID) | ORAL | 0 refills | Status: DC

## 2015-09-18 MED ORDER — IBUPROFEN 800 MG PO TABS
ORAL_TABLET | ORAL | Status: AC
  Filled 2015-09-18: qty 1
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
## 2015-09-20 ENCOUNTER — Ambulatory Visit: Payer: Self-pay | Admitting: Internal Medicine

## 2015-09-20 VITALS — BP 127/82 | HR 84 | Temp 98.1°F | Wt 200.0 lb

## 2015-09-20 DIAGNOSIS — Z794 Long term (current) use of insulin: Principal | ICD-10-CM

## 2015-09-20 DIAGNOSIS — E119 Type 2 diabetes mellitus without complications: Secondary | ICD-10-CM

## 2015-09-20 LAB — GLUCOSE, POCT (MANUAL RESULT ENTRY): POC GLUCOSE: 130 mg/dL — AB (ref 70–99)

## 2015-09-20 NOTE — Patient Instructions (Addendum)
Follow up labs A1c, MetC, CBC and lipids

## 2015-09-20 NOTE — Progress Notes (Signed)
   Subjective:    Patient ID: Angelica [name], female    DOB: November 19, 1960, 55 y.o.   MRN: [id number]  HPI Patient Active Problem List   Diagnosis Date Noted  . Menopausal hot flushes 07/20/2015  . GERD (gastroesophageal reflux disease) 01/27/2015  . Hypertension 01/27/2015  . Hyperlipidemia 01/27/2015  . Bell palsy 08/04/2014  . Type 2 diabetes mellitus ([place]) 08/04/2014    Patient reports hot flashes due to menopause.  Blood glucose has been in 130 range. Patient reports slight weight loss. Headaches are better.  Review of Systems     Objective:   Physical Exam  Constitutional: She is oriented to person, place, and time.  Cardiovascular: Normal rate, regular rhythm and normal heart sounds.   Pulmonary/Chest: Effort normal and breath sounds normal.  Neurological: She is alert and oriented to person, place, and time.    BP 127/82   Pulse 84   Temp 98.1 F (36.7 C)   Wt 200 lb (90.7 kg)   BMI 35.43 kg/m     Medication List       Accurate as of 09/20/15 11:10 AM. Always use your most recent med list.          ARTIFICIAL TEARS 0.1-0.3 % Soln Apply to eye. Reported on 06/07/2015   cyclobenzaprine 5 MG tablet Commonly known as:  FLEXERIL Take 1 tablet (5 mg total) by mouth 3 (three) times daily as needed for muscle spasms.   diclofenac 75 MG EC tablet Commonly known as:  VOLTAREN Take 1 tablet (75 mg total) by mouth 2 (two) times daily.   hydrochlorothiazide 12.5 MG tablet Commonly known as:  HYDRODIURIL Take 1 tablet (12.5 mg total) by mouth 2 (two) times daily.   LIPITOR 40 MG tablet Generic drug:  atorvastatin Take 40 mg by mouth.   metFORMIN 1000 MG tablet Commonly known as:  GLUCOPHAGE Take 1,000 mg by mouth 2 (two) times daily with a meal.   naproxen 500 MG tablet Commonly known as:  NAPROSYN Take 1 tablet (500 mg total) by mouth 2 (two) times daily with a meal.   NEURONTIN 100 MG capsule Generic drug:  gabapentin Take 100 mg by mouth. Reported on  04/26/2015   TOUJEO SOLOSTAR 300 UNIT/ML Sopn Generic drug:  Insulin Glargine Inject 10 Units into the skin daily.            Assessment & Plan:   Patient to continue checking glucose 3xday.  If it increases to 160 or higher, contact clinic. Patient has done well with dicontinuing of lisinopril and glipizide. Patient to return in 3 months with labs prior to appt: A1c, MetC, CBC and lipids

## 2015-09-21 ENCOUNTER — Ambulatory Visit: Payer: Self-pay | Admitting: Ophthalmology

## 2015-09-26 ENCOUNTER — Telehealth: Payer: Self-pay

## 2015-09-26 NOTE — Telephone Encounter (Signed)
Called pt. Pt wants to reschedule eye appt from Thursday to Sept 21. Appt made. Pt verbalized understanding.

## 2015-09-28 ENCOUNTER — Ambulatory Visit: Payer: Self-pay | Admitting: Ophthalmology

## 2015-10-12 ENCOUNTER — Ambulatory Visit: Payer: Self-pay | Admitting: Ophthalmology

## 2015-10-25 ENCOUNTER — Ambulatory Visit: Payer: Self-pay | Admitting: Internal Medicine

## 2015-11-02 ENCOUNTER — Ambulatory Visit: Payer: Self-pay | Admitting: Ophthalmology

## 2015-11-16 ENCOUNTER — Ambulatory Visit: Payer: Self-pay | Admitting: Ophthalmology

## 2015-11-29 ENCOUNTER — Other Ambulatory Visit: Payer: Self-pay

## 2015-11-29 DIAGNOSIS — E119 Type 2 diabetes mellitus without complications: Secondary | ICD-10-CM

## 2015-11-29 DIAGNOSIS — Z794 Long term (current) use of insulin: Principal | ICD-10-CM

## 2015-11-30 LAB — CBC WITH DIFFERENTIAL
BASOS: 0 %
Basophils Absolute: 0 10*3/uL (ref 0.0–0.2)
EOS (ABSOLUTE): 0.1 10*3/uL (ref 0.0–0.4)
Eos: 1 %
HEMATOCRIT: 38.8 % (ref 34.0–46.6)
Hemoglobin: 12.9 g/dL (ref 11.1–15.9)
Immature Grans (Abs): 0 10*3/uL (ref 0.0–0.1)
Immature Granulocytes: 0 %
LYMPHS ABS: 2.8 10*3/uL (ref 0.7–3.1)
Lymphs: 38 %
MCH: 26 pg — ABNORMAL LOW (ref 26.6–33.0)
MCHC: 33.2 g/dL (ref 31.5–35.7)
MCV: 78 fL — AB (ref 79–97)
MONOS ABS: 0.5 10*3/uL (ref 0.1–0.9)
Monocytes: 7 %
NEUTROS PCT: 54 %
Neutrophils Absolute: 4 10*3/uL (ref 1.4–7.0)
RBC: 4.96 x10E6/uL (ref 3.77–5.28)
RDW: 14.2 % (ref 12.3–15.4)
WBC: 7.4 10*3/uL (ref 3.4–10.8)

## 2015-11-30 LAB — COMPREHENSIVE METABOLIC PANEL
A/G RATIO: 1.2 (ref 1.2–2.2)
ALK PHOS: 150 IU/L — AB (ref 39–117)
ALT: 27 IU/L (ref 0–32)
AST: 18 IU/L (ref 0–40)
Albumin: 4.2 g/dL (ref 3.5–5.5)
BILIRUBIN TOTAL: 0.3 mg/dL (ref 0.0–1.2)
BUN/Creatinine Ratio: 11 (ref 9–23)
BUN: 8 mg/dL (ref 6–24)
CALCIUM: 9.6 mg/dL (ref 8.7–10.2)
CHLORIDE: 98 mmol/L (ref 96–106)
CO2: 27 mmol/L (ref 18–29)
Creatinine, Ser: 0.71 mg/dL (ref 0.57–1.00)
GFR calc Af Amer: 113 mL/min/{1.73_m2} (ref >59)
GFR, EST NON AFRICAN AMERICAN: 98 mL/min/{1.73_m2} (ref >59)
GLOBULIN, TOTAL: 3.6 g/dL (ref 1.5–4.5)
Glucose: 227 mg/dL — ABNORMAL HIGH (ref 65–99)
POTASSIUM: 4 mmol/L (ref 3.5–5.2)
SODIUM: 139 mmol/L (ref 134–144)
Total Protein: 7.8 g/dL (ref 6.0–8.5)

## 2015-11-30 LAB — LIPID PANEL
CHOL/HDL RATIO: 3.4 ratio (ref 0.0–4.4)
CHOLESTEROL TOTAL: 155 mg/dL (ref 100–199)
HDL: 45 mg/dL (ref >39)
LDL Calculated: 88 mg/dL (ref 0–99)
TRIGLYCERIDES: 110 mg/dL (ref 0–149)
VLDL Cholesterol Cal: 22 mg/dL (ref 5–40)

## 2015-11-30 LAB — HEMOGLOBIN A1C
ESTIMATED AVERAGE GLUCOSE: 217 mg/dL
Hgb A1c MFr Bld: 9.2 % — ABNORMAL HIGH (ref 4.8–5.6)

## 2015-12-06 ENCOUNTER — Ambulatory Visit: Payer: Self-pay | Admitting: Internal Medicine

## 2015-12-06 ENCOUNTER — Encounter: Payer: Self-pay | Admitting: Internal Medicine

## 2015-12-06 VITALS — BP 119/77 | HR 93 | Temp 97.6°F | Wt 203.0 lb

## 2015-12-06 DIAGNOSIS — E119 Type 2 diabetes mellitus without complications: Secondary | ICD-10-CM

## 2015-12-06 DIAGNOSIS — Z794 Long term (current) use of insulin: Principal | ICD-10-CM

## 2015-12-06 LAB — GLUCOSE, POCT (MANUAL RESULT ENTRY): POC GLUCOSE: 138 mg/dL — AB (ref 70–99)

## 2015-12-06 NOTE — Progress Notes (Signed)
   Subjective:    Patient ID: Angelica [name], female    DOB: Oct 14, 1960, 55 y.o.   MRN: [id number]  HPI   Pt f/u for diabetes. Blood sugars are doing fair. Pt has not been following diet as carefully related to stress, family, etc.  Otherwise no new complaints.   Patient Active Problem List   Diagnosis Date Noted  . Menopausal hot flushes 07/20/2015  . GERD (gastroesophageal reflux disease) 01/27/2015  . Hypertension 01/27/2015  . Hyperlipidemia 01/27/2015  . Bell palsy 08/04/2014  . Type 2 diabetes mellitus ([place]) 08/04/2014   Current Outpatient Prescriptions on File Prior to Visit  Medication Sig Dispense Refill  . ARTIFICIAL TEARS 0.1-0.3 % SOLN Apply to eye. Reported on 06/07/2015    . cyclobenzaprine (FLEXERIL) 5 MG tablet Take 1 tablet (5 mg total) by mouth 3 (three) times daily as needed for muscle spasms. 21 tablet 0  . hydrochlorothiazide (HYDRODIURIL) 12.5 MG tablet Take 1 tablet (12.5 mg total) by mouth 2 (two) times daily. (Patient taking differently: Take 12.5 mg by mouth daily. ) 90 tablet 6  . Insulin Glargine (TOUJEO SOLOSTAR) 300 UNIT/ML SOPN Inject 10 Units into the skin daily.     . metFORMIN (GLUCOPHAGE) 1000 MG tablet Take 1,000 mg by mouth 2 (two) times daily with a meal.    . atorvastatin (LIPITOR) 40 MG tablet Take 40 mg by mouth.     No current facility-administered medications on file prior to visit.      Review of Systems     Objective:   Physical Exam  Constitutional: She is oriented to person, place, and time.  Cardiovascular: Normal rate, regular rhythm and normal heart sounds.   Pulmonary/Chest: Effort normal and breath sounds normal.  Neurological: She is alert and oriented to person, place, and time.    BP 119/77   Pulse 93   Temp 97.6 F (36.4 C) (Oral)   Wt 203 lb (92.1 kg)   BMI 35.96 kg/m        Assessment & Plan:   Pt encouraged to place more attention on dietary needs.   F/u in 3 months w/ labs: Met C, CBC, Ua,  Micro-albumin, A1C, Lipid, TSH

## 2015-12-06 NOTE — Patient Instructions (Addendum)
Pt encouraged to place more attention on dietary needs.   F/u in 3 months w/ labs

## 2015-12-07 ENCOUNTER — Ambulatory Visit: Payer: Self-pay | Admitting: Ophthalmology

## 2015-12-27 ENCOUNTER — Other Ambulatory Visit: Payer: Self-pay

## 2016-01-03 ENCOUNTER — Ambulatory Visit: Payer: Self-pay | Admitting: Internal Medicine

## 2016-02-01 ENCOUNTER — Ambulatory Visit: Payer: Self-pay

## 2016-02-09 ENCOUNTER — Other Ambulatory Visit: Payer: Self-pay

## 2016-02-09 NOTE — Telephone Encounter (Signed)
Received PAP application from [facility] for Toujeo Pen placed for provider to sign.

## 2016-02-15 NOTE — Telephone Encounter (Signed)
Placed signed application/script in [facility] folder for pickup. 

## 2016-02-19 ENCOUNTER — Telehealth: Payer: Self-pay | Admitting: Pharmacist

## 2016-02-19 NOTE — Telephone Encounter (Signed)
[employer] PAP submitted to manufacturer today. Toujeo pen insulin, 10  Units once daily

## 2016-02-19 NOTE — Telephone Encounter (Signed)
[employer] PAP submitted to manufacturer today. Toujeo insulin, 10 units, once daily.

## 2016-03-05 ENCOUNTER — Other Ambulatory Visit: Payer: Self-pay

## 2016-03-05 DIAGNOSIS — Z794 Long term (current) use of insulin: Principal | ICD-10-CM

## 2016-03-05 DIAGNOSIS — E119 Type 2 diabetes mellitus without complications: Secondary | ICD-10-CM

## 2016-03-07 ENCOUNTER — Telehealth: Payer: Self-pay

## 2016-03-07 LAB — LIPID PANEL
CHOL/HDL RATIO: 4.9 ratio — AB (ref 0.0–4.4)
Cholesterol, Total: 188 mg/dL (ref 100–199)
HDL: 38 mg/dL — ABNORMAL LOW (ref >39)
LDL CALC: 110 mg/dL — AB (ref 0–99)
Triglycerides: 200 mg/dL — ABNORMAL HIGH (ref 0–149)
VLDL Cholesterol Cal: 40 mg/dL (ref 5–40)

## 2016-03-07 LAB — URINALYSIS
Bilirubin, UA: NEGATIVE
KETONES UA: NEGATIVE
Leukocytes, UA: NEGATIVE
Nitrite, UA: NEGATIVE
PROTEIN UA: NEGATIVE
RBC UA: NEGATIVE
UUROB: 0.2 mg/dL (ref 0.2–1.0)
pH, UA: 5.5 (ref 5.0–7.5)

## 2016-03-07 LAB — COMPREHENSIVE METABOLIC PANEL
ALT: 28 IU/L (ref 0–32)
AST: 15 IU/L (ref 0–40)
Albumin/Globulin Ratio: 1.4 (ref 1.2–2.2)
Albumin: 4.2 g/dL (ref 3.5–5.5)
Alkaline Phosphatase: 188 IU/L — ABNORMAL HIGH (ref 39–117)
BILIRUBIN TOTAL: 0.2 mg/dL (ref 0.0–1.2)
BUN/Creatinine Ratio: 9 (ref 9–23)
BUN: 8 mg/dL (ref 6–24)
CALCIUM: 9.4 mg/dL (ref 8.7–10.2)
CHLORIDE: 96 mmol/L (ref 96–106)
CO2: 27 mmol/L (ref 18–29)
Creatinine, Ser: 0.89 mg/dL (ref 0.57–1.00)
GFR, EST AFRICAN AMERICAN: 86 mL/min/{1.73_m2} (ref >59)
GFR, EST NON AFRICAN AMERICAN: 74 mL/min/{1.73_m2} (ref >59)
GLUCOSE: 585 mg/dL — AB (ref 65–99)
Globulin, Total: 3.1 g/dL (ref 1.5–4.5)
Potassium: 4.1 mmol/L (ref 3.5–5.2)
Sodium: 140 mmol/L (ref 134–144)
TOTAL PROTEIN: 7.3 g/dL (ref 6.0–8.5)

## 2016-03-07 LAB — CBC
HEMATOCRIT: 40.2 % (ref 34.0–46.6)
HEMOGLOBIN: 12.8 g/dL (ref 11.1–15.9)
MCH: 26.9 pg (ref 26.6–33.0)
MCHC: 31.8 g/dL (ref 31.5–35.7)
MCV: 85 fL (ref 79–97)
Platelets: 399 10*3/uL — ABNORMAL HIGH (ref 150–379)
RBC: 4.76 x10E6/uL (ref 3.77–5.28)
RDW: 14.5 % (ref 12.3–15.4)
WBC: 7.7 10*3/uL (ref 3.4–10.8)

## 2016-03-07 LAB — HEMOGLOBIN A1C
ESTIMATED AVERAGE GLUCOSE: 390 mg/dL
Hgb A1c MFr Bld: 15.2 % — ABNORMAL HIGH (ref 4.8–5.6)

## 2016-03-07 LAB — MICROALBUMIN / CREATININE URINE RATIO
CREATININE, UR: 28.7 mg/dL
Microalb/Creat Ratio: <10.5 mg/g creat (ref 0.0–30.0)

## 2016-03-07 LAB — TSH: TSH: 1.47 u[IU]/mL (ref 0.450–4.500)

## 2016-03-07 NOTE — Telephone Encounter (Signed)
Called pt with results from doc. Pt's glucose was really high. Dr wanted to make sure she was not out of insulin or meds. Pt stated she had the flu can wuit taking her insulin and that's why her numbers were so high. She stated she is not taking her insulin again and I asked her to keep a log of her sugars until she comes in on 02/28. I also signed her up for the diabetes class. Pt verbalized understanding.

## 2016-03-20 ENCOUNTER — Ambulatory Visit: Payer: Self-pay | Admitting: Internal Medicine

## 2016-03-20 ENCOUNTER — Encounter: Payer: Self-pay | Admitting: Internal Medicine

## 2016-03-20 VITALS — BP 112/84 | HR 79 | Temp 98.1°F | Wt 184.0 lb

## 2016-03-20 DIAGNOSIS — E119 Type 2 diabetes mellitus without complications: Secondary | ICD-10-CM

## 2016-03-20 LAB — GLUCOSE, POCT (MANUAL RESULT ENTRY): POC GLUCOSE: 348 mg/dL — AB (ref 70–99)

## 2016-03-20 NOTE — Patient Instructions (Addendum)
Check your blood sugar twice a day before breakfast and dinner and drink plenty of water. Please bring as many readings from your meter that you can for your follow-up.   F/u in 3-4 weeks

## 2016-03-20 NOTE — Progress Notes (Signed)
   Subjective:    Patient ID: Angelica [name], female    DOB: [birth year], 56 y.o.   MRN: [id number]  HPI   Pt f/u for lab results.  Pt reports recently having the flu and several UTIs. She says she feels thirsty all the time and has frequent urination. She reports pain from the UTIs.  Pt reports not having checked her glucose in awhile.  Patient Active Problem List   Diagnosis Date Noted  . Menopausal hot flushes 07/20/2015  . GERD (gastroesophageal reflux disease) 01/27/2015  . Hypertension 01/27/2015  . Hyperlipidemia 01/27/2015  . Bell palsy 08/04/2014  . Type 2 diabetes mellitus ([place]) 08/04/2014   Allergies as of 03/20/2016   No Known Allergies     Medication List       Accurate as of 03/20/16 10:18 AM. Always use your most recent med list.          ARTIFICIAL TEARS 0.1-0.3 % Soln Apply to eye. Reported on 06/07/2015   atorvastatin 40 MG tablet Commonly known as:  LIPITOR Take 40 mg by mouth.   hydrochlorothiazide 12.5 MG tablet Commonly known as:  HYDRODIURIL Take 1 tablet (12.5 mg total) by mouth 2 (two) times daily.   Insulin Glargine 300 UNIT/ML Sopn Inject into the skin.   metFORMIN 1000 MG tablet Commonly known as:  GLUCOPHAGE Take 1,000 mg by mouth 2 (two) times daily with a meal.        Review of Systems     Objective:   Physical Exam  Constitutional: She is oriented to person, place, and time.  Cardiovascular: Normal rate, regular rhythm and normal heart sounds.   Pulmonary/Chest: Effort normal and breath sounds normal.  Neurological: She is alert and oriented to person, place, and time.    BP 112/84   Pulse 79   Temp 98.1 F (36.7 C) (Oral)   Wt 184 lb (83.5 kg)   BMI 32.59 kg/m   POCT Glucose is 348. Last A1C is 15.2      Assessment & Plan:   F/u in 3-4 weeks w/ random sugar  Referral to Endocrinology clinic Counseled Angelica [name] on reducing and regularly checking her glucose. Check BID before breakfast and dinner.

## 2016-04-09 ENCOUNTER — Telehealth: Payer: Self-pay | Admitting: Pharmacist

## 2016-04-09 ENCOUNTER — Ambulatory Visit: Payer: Self-pay | Admitting: Endocrinology

## 2016-04-09 VITALS — BP 128/83 | HR 86 | Wt 185.4 lb

## 2016-04-09 DIAGNOSIS — Z794 Long term (current) use of insulin: Principal | ICD-10-CM

## 2016-04-09 DIAGNOSIS — E1165 Type 2 diabetes mellitus with hyperglycemia: Secondary | ICD-10-CM

## 2016-04-09 MED ORDER — INSULIN GLARGINE 100 UNIT/ML SOLOSTAR PEN: 15.0000 [IU] | PEN_INJECTOR | Freq: Every day | SUBCUTANEOUS | 11 refills | Status: DC

## 2016-04-09 NOTE — Telephone Encounter (Signed)
Faxed Re Enrollment application to [employer] for [employer] Pen  Inject 10 units daily.

## 2016-04-09 NOTE — Progress Notes (Signed)
Endocrinology Consult  Assessment/Plan:   Assessment and Plan: 1) She is extremely hyperglycemic with symptoms. Simply feel she is on too little insulin. She complains of headaches with Toujeo if goes above 10 units. Unwilling to take insulin by vial and syringe.  Switch to Lantus as long-acting insulin but will take 4-6 weeks to arrive at [place]. For now she is willing to try going up on Toujeo. Start with 15-units and take consistently. Titration -- if morning sugar is >200 after 3 days then increase by 5 units.    There are no diagnoses linked to this encounter.  Will have her Return in one month    Subjective:   History of Present Illness:  Angelica [name] is a 56 y.o. female seen in consultation at the request of [facility] PCP referred to the [facility]. Reports diagnosed with T2DM approximately 2-4 year ago (2014-1016). Started with metformin but insulin was added approximately one year ago. Tried to take 1500-2000 MG of metformin but acknowledges GI distress. Acknowledges missing doses of insulin and will take sporadically in the morning or evening. Acknowledges missing doses approx 1-2 times per week.  Checks blood sugars 1-2 per day, typically in the morning. As of late blood glucose has been "out of control" and in the 350-400 ranges. In the past, blood glucoses ranged in the mid 100s.   Diet is lacking but states "drinks more than eats". Overall denies excess use of soda, but drinks a lot of cranberry juice. Denies regimented exercises but acknowledges household activities. Denies any lack of mobility and able to complete all daily activities .  Acknowledges polydipsia, polyuria, blurred vision. Denies neuropathy.   Last eye exam was within a year -- Bell's Palsy approximately two years ago. Severe case with nearly complete paralysis. Improving slowly. Acknowledges night time vision is lack.   Reports that at 12-units of insulin experienced headaches. Started to take 10 units again.     Medical History:   Past Medical History:  Diagnosis Date  . Bell palsy 08/04/2014  . GERD (gastroesophageal reflux disease) 01/27/2015  . History of Bell's palsy June 2016  . Hyperlipidemia 01/27/2015  . Hypertension 01/27/2015  . Type 2 diabetes mellitus ([place]) 08/04/2014     Past Surgical History:  Procedure Laterality Date  . OOPHORECTOMY Right 2005     Current Outpatient Prescriptions  Medication Sig Dispense Refill  . aspirin EC 81 MG tablet Take 81 mg by mouth daily.    Marland Kitchen atorvastatin (LIPITOR) 40 MG tablet Take 40 mg by mouth.    . hydrochlorothiazide (HYDRODIURIL) 12.5 MG tablet Take 1 tablet (12.5 mg total) by mouth 2 (two) times daily. 90 tablet 6  . Insulin Glargine 300 UNIT/ML SOPN Inject 10 Units into the skin.     . metFORMIN (GLUCOPHAGE) 1000 MG tablet Take 500 mg by mouth 2 (two) times daily with a meal.     . omeprazole (PRILOSEC) 20 MG capsule Take 20 mg by mouth daily.    . phenazopyridine (PYRIDIUM) 95 MG tablet Take 95 mg by mouth 3 (three) times daily as needed for pain.    Marland Kitchen ARTIFICIAL TEARS 0.1-0.3 % SOLN Apply to eye. Reported on 06/07/2015     No current facility-administered medications for this visit.     No Known Allergies  Family History:  Family History  Problem Relation Age of Onset  . Cancer Brother     Colon Cancer  . Breast cancer Neg Hx    Brother with DM. 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   Review of Systems:  12 Systems questionnaire reviewed with patient and all systems negative except per HPI.  Objective:   Physical Exam:  BP 128/83   Pulse 86   Wt 185 lb 6.4 oz (84.1 kg)   BMI 32.84 kg/m  Constitutional: Alert, oriented, in NAD Cardiovascular: Regular rhythm, no murmurs, gallops, rubs.  Respiratory: Lungs clear, no wheezes or rales. CTAB Skin: no rashes or lesions. Feet without lesions Neuro: light touch sensation normal in feet in  5/5 spots bilaterally by monofilament Psychiatric: Oriented, normal mood and affect    Data Review:  Results for orders placed or performed in visit on 03/20/16  POCT Glucose (CBG)  Result Value Ref Range   POC Glucose 348 (A) 70 - 99 mg/dl   Lab Results  Component Value Date   HGBA1C 15.2 (H) 03/05/2016    Lab Results  Component Value Date   CREATININE 0.89 03/05/2016

## 2016-04-11 ENCOUNTER — Telehealth: Payer: Self-pay | Admitting: Pharmacist

## 2016-04-11 NOTE — Telephone Encounter (Signed)
Faxed [employer] application for [employer] Inject 15 units daily at 10pm- to replace Toujeo per Sharon, she worked at [facility] on 04/09/16.

## 2016-04-17 ENCOUNTER — Ambulatory Visit: Payer: Self-pay | Attending: Oncology | Admitting: *Deleted

## 2016-04-17 ENCOUNTER — Encounter: Payer: Self-pay | Admitting: Internal Medicine

## 2016-04-17 ENCOUNTER — Encounter: Payer: Self-pay | Admitting: *Deleted

## 2016-04-17 ENCOUNTER — Ambulatory Visit: Payer: Self-pay | Admitting: Internal Medicine

## 2016-04-17 ENCOUNTER — Ambulatory Visit
Admission: RE | Admit: 2016-04-17 | Discharge: 2016-04-17 | Disposition: A | Discharge status: Home / Self Care | Payer: Self-pay | Source: Ambulatory Visit | Attending: Oncology | Admitting: Oncology

## 2016-04-17 VITALS — BP 102/71 | HR 88 | Temp 95.2°F | Resp 18 | Ht 64.0 in | Wt 183.2 lb

## 2016-04-17 VITALS — BP 125/88 | HR 81 | Temp 97.9°F | Wt 179.9 lb

## 2016-04-17 DIAGNOSIS — Z0000 Encounter for general adult medical examination without abnormal findings: Secondary | ICD-10-CM

## 2016-04-17 DIAGNOSIS — E119 Type 2 diabetes mellitus without complications: Secondary | ICD-10-CM

## 2016-04-17 DIAGNOSIS — N390 Urinary tract infection, site not specified: Secondary | ICD-10-CM

## 2016-04-17 LAB — POCT GLUCOSE (DEVICE FOR HOME USE): Glucose Fasting, POC: 321 mg/dL — AB (ref 70–99)

## 2016-04-17 IMAGING — MG MM DIGITAL SCREENING BILAT W/ TOMO W/ CAD
8 of 12 series · 8 of 28 positions shown · non-contrast
Comparison: Previous exam(s).

CLINICAL DATA: Screening.

EXAM:
2D DIGITAL SCREENING BILATERAL MAMMOGRAM WITH CAD AND ADJUNCT TOMO

[L MLO synth-2D]
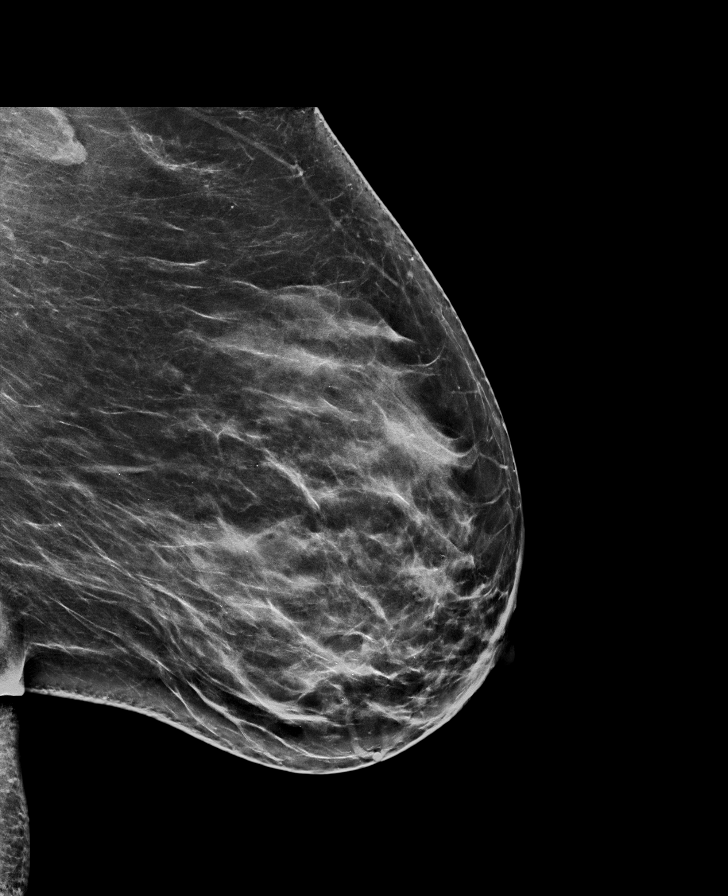

[L CC synth-2D]
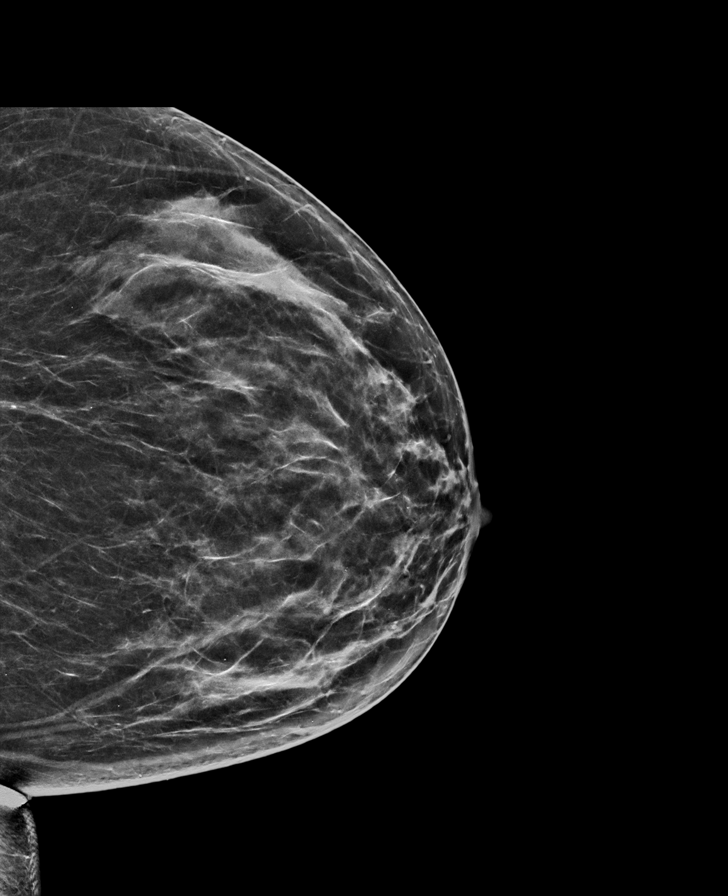

[R CC]
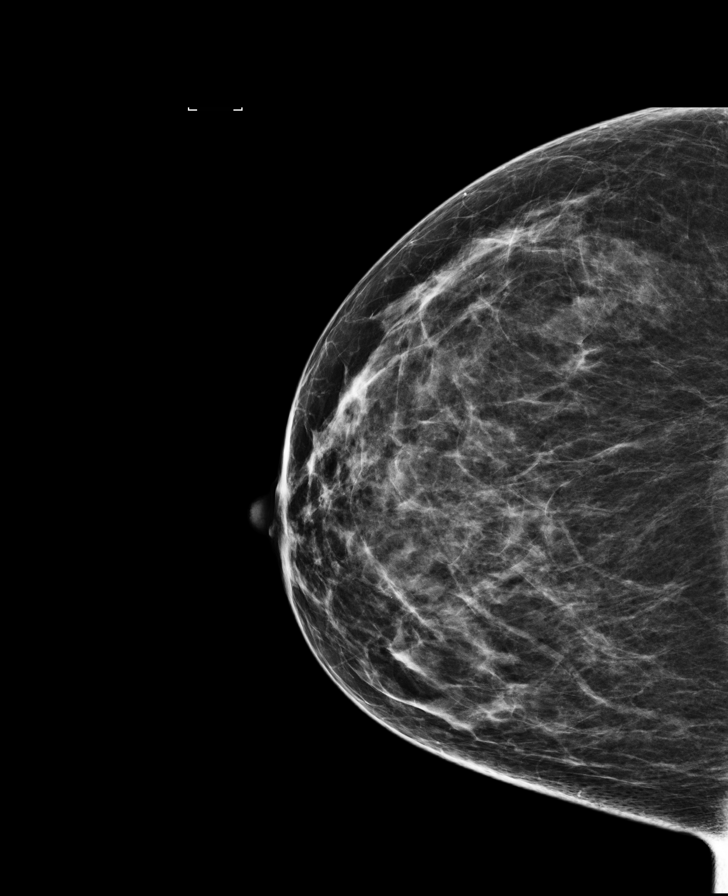

[R CC synth-2D]
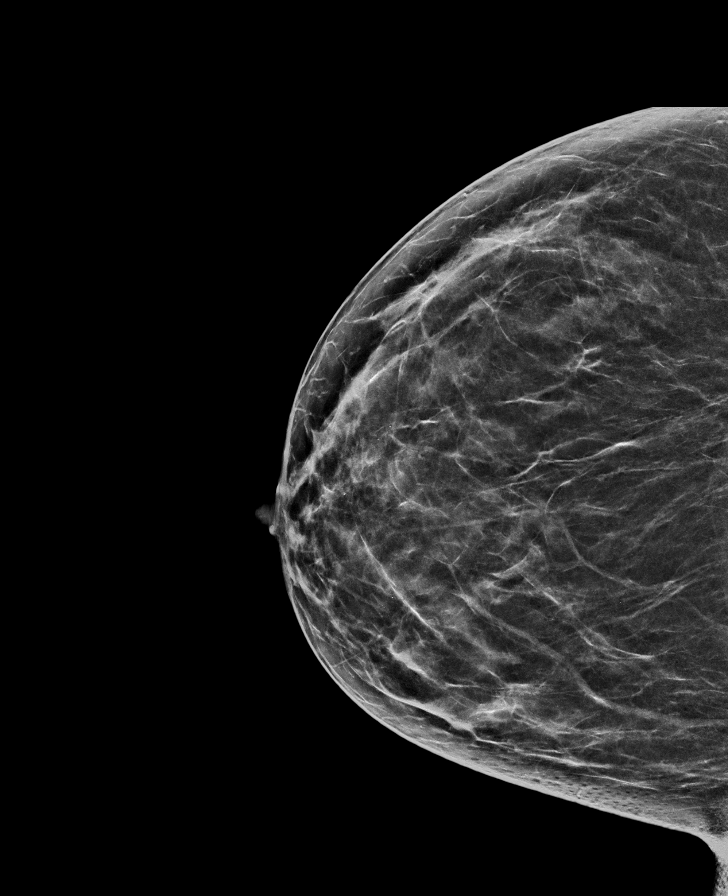

[R MLO]
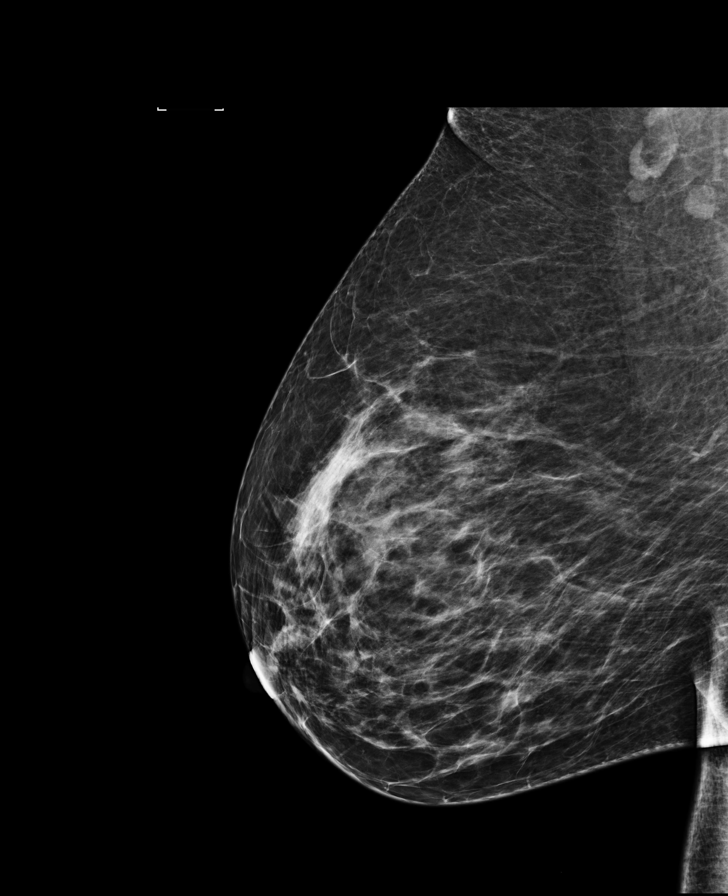

[R MLO synth-2D]
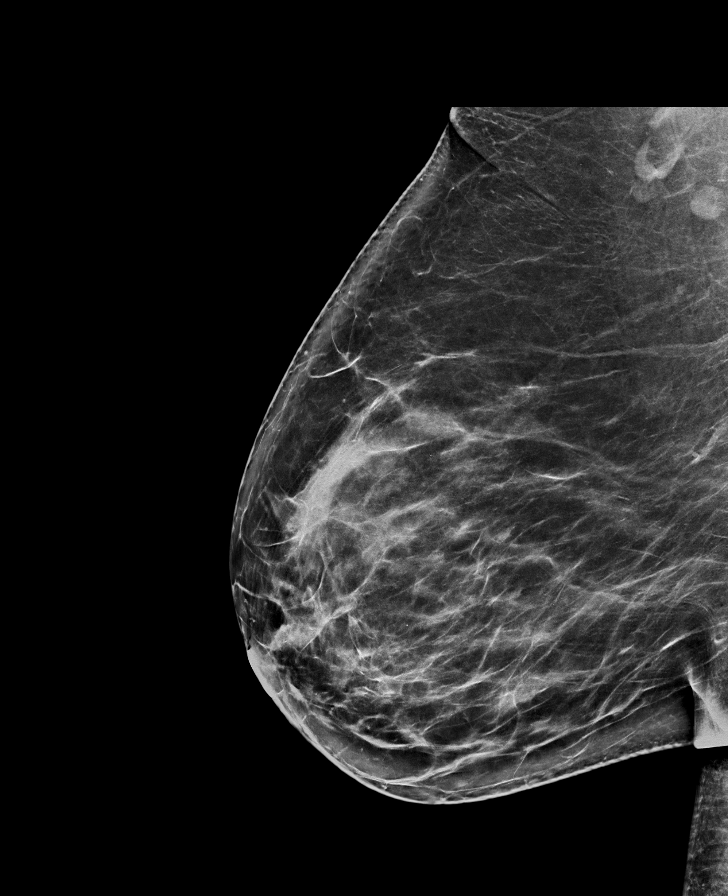

[L CC]
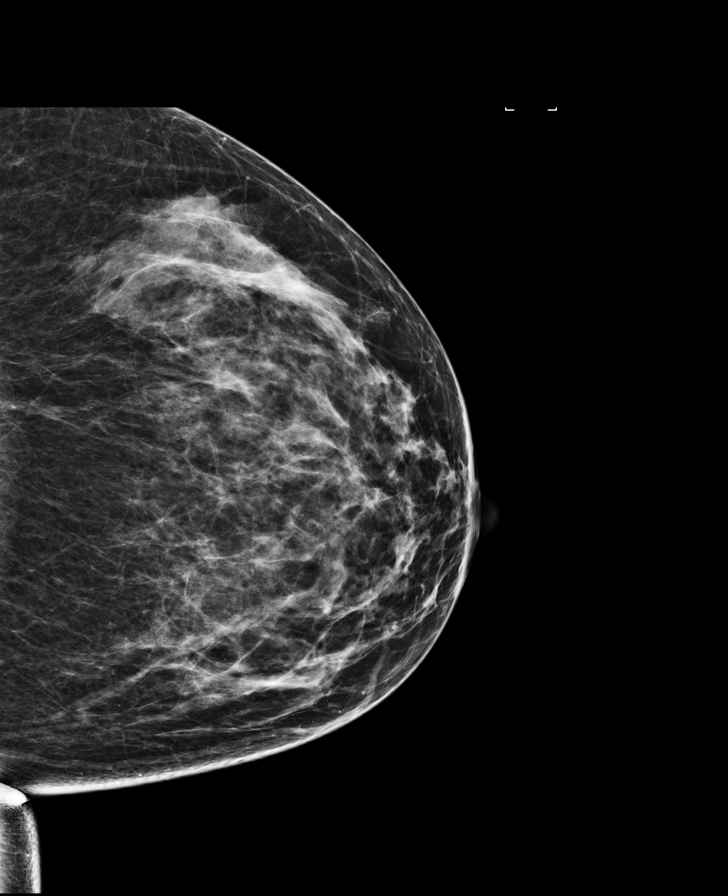

[L MLO]
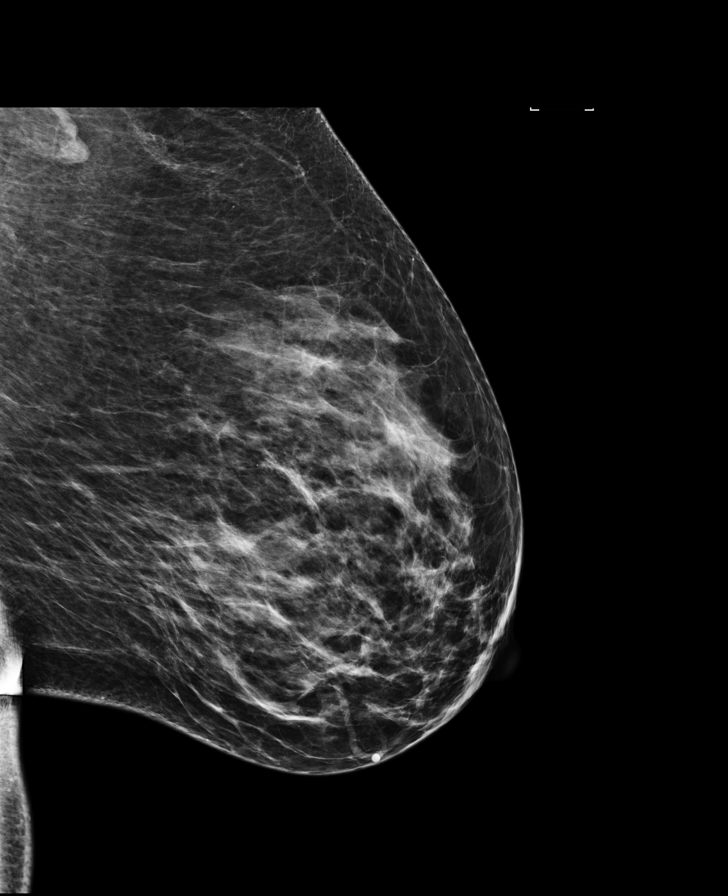

[8 of 28 positions shown; findings below may reference images not displayed]

ACR Breast Density Category c: The breast tissue is heterogeneously
dense, which may obscure small masses.
FINDINGS: There are no findings suspicious for malignancy. Images were
processed with CAD.
IMPRESSION: No mammographic evidence of malignancy. A result letter of this
screening mammogram will be mailed directly to the patient.

RECOMMENDATION:
Screening mammogram in one year. (Code:[TA])

BI-RADS CATEGORY  1: Negative.

## 2016-04-17 MED ORDER — SULFAMETHOXAZOLE-TRIMETHOPRIM 800-160 MG PO TABS: 1.0000 | ORAL_TABLET | Freq: Two times a day (BID) | ORAL | 0 refills | Status: DC

## 2016-04-17 NOTE — Progress Notes (Signed)
Subjective:     Patient ID: Angelica [name], female   DOB: 29-Jun-1960, 56 y.o.   MRN: [id number]  HPI   Review of Systems     Objective:   Physical Exam  Pulmonary/Chest: Right breast exhibits no inverted nipple, no mass, no nipple discharge, no skin change and no tenderness. Left breast exhibits no inverted nipple, no mass, no nipple discharge, no skin change and no tenderness. Breasts are symmetrical.  Abdominal: There is no splenomegaly or hepatomegaly.    Genitourinary: No labial fusion. There is no rash, tenderness, lesion or injury on the right labia. There is no rash, tenderness, lesion or injury on the left labia. Cervix exhibits discharge. Cervix exhibits no motion tenderness and no friability. Right adnexum displays no mass, no tenderness and no fullness. Left adnexum displays no mass, no tenderness and no fullness. No erythema, tenderness or bleeding in the vagina. No foreign body in the vagina. No signs of injury around the vagina. No vaginal discharge found.  Genitourinary Comments: Patient state she only has one ovary.  She is not sure which one she has.  Patient has had a supracervical hysterectomy.  Light tan discharge noted.       Assessment:     56 year old Black female returns to [facility] for her annual screening.  Clinical breast exam unremarkable.  Taught self breast awareness.  Last pap on 08/11/13 was -/-.  Next pap due in 2021.  Patient complains of "UTI" symptoms.  States she has "burning, stinging, frequency" with urination.  Patient states she has an appointment at the [facility] tomorrow.  Encouraged her to discuss her symptoms with the provider there.  Explained that we could not prescribe anything.  She is agreeable.  Patient has been screened for eligibility.  She does not have any insurance, Medicare or Medicaid.  She also meets financial eligibility.  Hand-out given on the Affordable Care Act.    Plan:     Screening mammogram ordered.  Will follow-up per  [facility] protocol.

## 2016-04-17 NOTE — Patient Instructions (Signed)
Gave patient hand-out, Women Staying Healthy, Active and Well from [facility], with education on breast health, pap smears, heart and colon health. 

## 2016-04-17 NOTE — Patient Instructions (Addendum)
Follow up with endocrinologist   Labs today: UA, urine culture  Follow up in 6 months with labs a1c , met C, CBC, UA, microalbumin, and Lipid 

## 2016-04-17 NOTE — Progress Notes (Signed)
   Subjective:    Patient ID: Angelica [name], female    DOB: 07/27/1960, 56 y.o.   MRN: [id number]  HPI Pt had recent appt with endocrinologist- insulin dose changed Pt has had recurring UTI  Patient Active Problem List   Diagnosis Date Noted  . Menopausal hot flushes 07/20/2015  . GERD (gastroesophageal reflux disease) 01/27/2015  . Hypertension 01/27/2015  . Hyperlipidemia 01/27/2015  . Bell palsy 08/04/2014  . Type 2 diabetes mellitus ([facility]) 08/04/2014    Allergies as of 04/17/2016   No Known Allergies     Medication List       Accurate as of 04/17/16 12:00 PM. Always use your most recent med list.          aspirin EC 81 MG tablet Take 81 mg by mouth daily.   atorvastatin 40 MG tablet Commonly known as:  LIPITOR Take 40 mg by mouth.   hydrochlorothiazide 12.5 MG tablet Commonly known as:  HYDRODIURIL Take 1 tablet (12.5 mg total) by mouth 2 (two) times daily.   Insulin Glargine 100 UNIT/ML Solostar Pen Commonly known as:  LANTUS SOLOSTAR Inject 15 Units into the skin daily at 10 pm.   metFORMIN 1000 MG tablet Commonly known as:  GLUCOPHAGE Take 500 mg by mouth 2 (two) times daily with a meal.   omeprazole 20 MG capsule Commonly known as:  PRILOSEC Take 20 mg by mouth daily.         Review of Systems     Objective:   Physical Exam  BP 125/88   Pulse 81   Temp 97.9 F (36.6 C) (Oral)   Wt 179 lb 14.4 oz (81.6 kg)   BMI 30.88 kg/m        Assessment & Plan:  Follow up with endocrinologist   Labs today: UA, urine culture  Follow up in 6 months with labs a1c , met C, CBC, UA, microalbumin, and Lipid   

## 2016-04-18 NOTE — Progress Notes (Signed)
Letter mailed from the [facility] to inform patient of her normal mammogram results.  Patient is to follow-up with annual screening in one year.  HSIS to [place]. 

## 2016-04-18 NOTE — Progress Notes (Signed)
Pt needs an appointment, ordered labs need to be drawn, appointment after labs drawn, if labs can not be drawn, really needs an appointment

## 2016-04-19 LAB — URINALYSIS
BILIRUBIN UA: NEGATIVE
KETONES UA: NEGATIVE
Leukocytes, UA: NEGATIVE
Nitrite, UA: NEGATIVE
PROTEIN UA: NEGATIVE
RBC, UA: NEGATIVE
Specific Gravity, UA: >=1.03 — AB (ref 1.005–1.030)
Urobilinogen, Ur: 0.2 mg/dL (ref 0.2–1.0)
pH, UA: 5 (ref 5.0–7.5)

## 2016-04-19 LAB — URINE CULTURE

## 2016-05-01 ENCOUNTER — Ambulatory Visit: Payer: Self-pay | Admitting: Ophthalmology

## 2016-05-07 ENCOUNTER — Ambulatory Visit: Payer: Self-pay | Admitting: Endocrinology

## 2016-05-07 VITALS — BP 122/78 | HR 77 | Temp 98.0°F | Wt 187.5 lb

## 2016-05-07 DIAGNOSIS — E119 Type 2 diabetes mellitus without complications: Secondary | ICD-10-CM

## 2016-05-07 LAB — GLUCOSE, POCT (MANUAL RESULT ENTRY): POC GLUCOSE: 239 mg/dL — AB (ref 70–99)

## 2016-05-07 MED ORDER — INSULIN GLARGINE 100 UNIT/ML SOLOSTAR PEN: 20.0000 [IU] | PEN_INJECTOR | Freq: Every day | SUBCUTANEOUS | 11 refills | Status: DC

## 2016-05-07 NOTE — Patient Instructions (Addendum)
DO not take both Toujeo and Lantus  With Toujeo or Lantus start at 20 units. Every 3 days that you check your blood sugar in the morning (fasting), if your blood sugar is greater than 150 then increase the Lantus (or Toujeo) by 5 units. For example, if you start at 15 and in three days your blood sugar is greater than 150 in the morning, increase to 20. Continue to increase by 5 units until blood sugars are below 150 for two checks in a row (approximately one week).   Max dosageis 80 units. So when you come back you should either have a blood sugar less than 150 in the AM before breakfast or you should be taking 80 units every night

## 2016-05-07 NOTE — Progress Notes (Signed)
Assessment:   1. Diabetes mellitus without complication ([place]) Has a way to go to get blood sugar donw  - POCT Glucose (CBG)      Plan:    1.  Rx changes: Start Lantus next week (when picked up). With Toujeo now (starting at 15 units) or when switching to Lantus, check blood sugar >150 fasting in the morning, increase by 5 units. Continue to increase by units until the fasting blood sugar is less than 150 consistently. Discussed thatwhen she returns she should be on 80 units Lantus or have blood sugar <150.  Discontinue Toujeo when lantus is available.   [place] attended the shared medical appointment on 05/07/16. Topics that were discussed tonight include diet, exercise, taking medications, blood glucose pattern recognition, meaning of laboratory results.   3. Follow up: I recommend patient follow-up with me at 2 months.      Subjective:   T2DM  Angelica [name] is a 56 y.o. female who is seen in follow up for Type 2 diabetes.    complicated by obesity, hypertension and dyslipidemia   A1c was 15.2 in February and is 12.6 tonight (05/07/16).  Cardiovascular risk factors: hypertension (controlled), dyslipidemia, obesity, physical inactivity  Eye exam current (within one year): Eye appointment on 08/07/16.  Current regimen: Currently on 15 units of Toujeo but received letter for Lantus. Expected start date is next week for Lantus.   Patient's glucometer was not here tonight and goes several days without checking blood sugars. Perceived average is 200. States that blood sugar is not regular.   Denies hypoglycemia and any symptoms associated with hypoglycemia.  Denies polyuria, polydypsia, polyphagia and weight loss Denies chest pain, shortness of breath, edema, foot lesions or ulcers.  Denies severe hypoglycemia or admission to hospital for DKA.  Current exercise: None but states that will start walking on track once per week. "This is manageable and my son is  encouraging me to start".   The patient's history was reviewed and updated as appropriate.  No Known Allergies   Current Outpatient Prescriptions:    aspirin EC 81 MG tablet, Take 81 mg by mouth daily., Disp: , Rfl:    atorvastatin (LIPITOR) 40 MG tablet, Take 40 mg by mouth., Disp: , Rfl:    Insulin Glargine (TOUJEO SOLOSTAR) 300 UNIT/ML SOPN, Inject 15 Units into the skin daily with breakfast. Taking until gets Lantus from [facility], Disp: , Rfl:    metFORMIN (GLUCOPHAGE) 1000 MG tablet, Take 500 mg by mouth 2 (two) times daily with a meal. , Disp: , Rfl:    omeprazole (PRILOSEC) 20 MG capsule, Take 20 mg by mouth daily., Disp: , Rfl:    hydrochlorothiazide (HYDRODIURIL) 12.5 MG tablet, Take 1 tablet (12.5 mg total) by mouth 2 (two) times daily., Disp: 90 tablet, Rfl: 6   Insulin Glargine (LANTUS SOLOSTAR) 100 UNIT/ML Solostar Pen, Inject 15 Units into the skin daily at 10 pm. (Patient not taking: Reported on 05/07/2016), Disp: 15 mL, Rfl: 11   sulfamethoxazole-trimethoprim (BACTRIM DS,SEPTRA DS) 800-160 MG tablet, Take 1 tablet by mouth 2 (two) times daily. (Patient not taking: Reported on 05/07/2016), Disp: 14 tablet, Rfl: 0  Social History   Social History   Marital status: Single    Spouse name: N/A   Number of children: N/A   Years of education: N/A   Social History Main Topics   Smoking status: Never Smoker   Smokeless tobacco: Never Used   Alcohol use No   Drug  use: No   Sexual activity: Not on file   Other Topics Concern   Not on file   Social History Narrative   No narrative on file    Family History  Problem Relation Age of Onset   Cancer Brother     Colon Cancer   Breast cancer Neg Hx     Review of Systems A 12 point review of systems was negative except for pertinent items noted in the HPI.   Objective:     Wt Readings from Last 3 Encounters:  05/07/16 187 lb 8 oz (85 kg)  04/17/16 179 lb 14.4 oz (81.6 kg)  04/17/16 183 lb 3.5 oz (83.1  kg)   BP 122/78    Pulse 77    Temp 98 F (36.7 C)    Wt 187 lb 8 oz (85 kg)    BMI 32.18 kg/m   General appearance:  alert and appears stated age, in no distress      Eyes:  conjunctivae/corneas clear. EOM's intact. Sclera anicteric.  L eye has eye lid lag with some proptosis.      Neck: no adenopathy, supple, symmetrical, trachea midline  Thyroid:  Mobile, normal size, no palpable nodule  Lung: clear to auscultation bilaterally  Heart:  regular rate and rhythm, S1, S2 normal, no murmur, click, rub or gallop  Abdomen:    Extremities: extremities normal, atraumatic, no cyanosis or edema     Pulses: DP & PT 2+ and symmetric.  Neuro: normal without focal findings, mental status, speech normal, alert and oriented x3, gait normal.   Feet: negative lesions or ulcers, 10 gram monofilament intact bilateral plantar surface     Lab Review No components found for: A1C Glucose (mg/dL)  Date Value  03/05/2016 585 (>)  11/29/2015 227 (H)  06/07/2015 117 (H)  05/30/2013 138 (H)  10/19/2012 170 (H)   CO2 (mmol/L)  Date Value  03/05/2016 27  11/29/2015 27  06/07/2015 29   Co2 (mmol/L)  Date Value  05/30/2013 30  10/19/2012 29   BUN (mg/dL)  Date Value  03/05/2016 8  11/29/2015 8  06/07/2015 10  05/30/2013 13  10/19/2012 9   Creatinine (mg/dL)  Date Value  05/30/2013 1.05  10/19/2012 0.84   Creatinine, Ser (mg/dL)  Date Value  03/05/2016 0.89  11/29/2015 0.71  06/07/2015 0.80   No components found for: LDL,  LDLCALC,  LDLDIRECT Lab Results  Component Value Date   NA 140 03/05/2016   K 4.1 03/05/2016   CL 96 03/05/2016   CO2 27 03/05/2016   BUN 8 03/05/2016   CREATININE 0.89 03/05/2016   GFRAA 86 03/05/2016   GFRNONAA 74 03/05/2016   GLU 351 09/15/2014   CALCIUM 9.4 03/05/2016   ALBUMIN 4.2 03/05/2016     DIABETES MELLITUS RESULTS: Lab Results  Component Value Date   HGBA1C 15.2 (H) 03/05/2016   HGBA1C 9.2 (H) 11/29/2015   HGBA1C 7.7 (H) 09/06/2015    Lab Results  Component Value Date   MICROALBUR 58.2 09/15/2014   LDLCALC 110 (H) 03/05/2016   CREATININE 0.89 03/05/2016   Lab Results  Component Value Date   CHOL 188 03/05/2016   Lab Results  Component Value Date   LDLCALC 110 (H) 03/05/2016   No components found for: CHOLLLDLDIRECT No components found for: MICROALB/CR Lab Results  Component Value Date   GFRAA 86 03/05/2016   GFRNONAA 74 03/05/2016

## 2016-05-08 LAB — POCT GLYCOSYLATED HEMOGLOBIN (HGB A1C): HEMOGLOBIN A1C: 12.6

## 2016-05-08 NOTE — Addendum Note (Signed)
Addended by: Lurena Nida D on: 05/08/2016 11:36 AM   Modules accepted: Orders

## 2016-05-10 ENCOUNTER — Other Ambulatory Visit: Payer: Self-pay

## 2016-05-10 DIAGNOSIS — E7800 Pure hypercholesterolemia, unspecified: Secondary | ICD-10-CM

## 2016-05-10 DIAGNOSIS — I10 Essential (primary) hypertension: Secondary | ICD-10-CM

## 2016-05-10 MED ORDER — HYDROCHLOROTHIAZIDE 12.5 MG PO TABS: 12.5000 mg | ORAL_TABLET | Freq: Two times a day (BID) | ORAL | 3 refills | Status: DC

## 2016-05-10 MED ORDER — METFORMIN HCL 1000 MG PO TABS: 500.0000 mg | ORAL_TABLET | Freq: Two times a day (BID) | ORAL | 3 refills | Status: DC

## 2016-05-10 MED ORDER — ATORVASTATIN CALCIUM 40 MG PO TABS: 40.0000 mg | ORAL_TABLET | Freq: Every day | ORAL | 3 refills | Status: DC

## 2016-05-15 ENCOUNTER — Ambulatory Visit: Payer: Self-pay | Admitting: Ophthalmology

## 2016-05-15 ENCOUNTER — Ambulatory Visit: Payer: Self-pay | Admitting: Internal Medicine

## 2016-06-05 ENCOUNTER — Telehealth: Payer: Self-pay

## 2016-06-05 NOTE — Telephone Encounter (Signed)
Received PAP application from [facility] for Lantus placed for provider to sign. 

## 2016-06-05 NOTE — Telephone Encounter (Signed)
Placed signed application/script in [facility] folder for pickup. 

## 2016-06-06 ENCOUNTER — Ambulatory Visit: Payer: Self-pay | Admitting: Ophthalmology

## 2016-06-10 ENCOUNTER — Telehealth: Payer: Self-pay | Admitting: Pharmacist

## 2016-06-10 NOTE — Telephone Encounter (Signed)
06/07/16 Lantus Solostar refill-faxed refill request to [facility] for Max daily dose 80 units daily at 10pm,- Inject 20-80 units daily every day at 10pm, start with 20 units and increase by 5 units every 3-7 days if BS > 150.

## 2016-07-30 ENCOUNTER — Ambulatory Visit: Payer: Self-pay

## 2016-08-06 ENCOUNTER — Ambulatory Visit: Payer: Self-pay

## 2016-09-10 ENCOUNTER — Ambulatory Visit: Payer: Self-pay | Admitting: Endocrinology

## 2016-09-10 VITALS — BP 134/85 | HR 86 | Temp 98.2°F | Ht 63.0 in | Wt 194.6 lb

## 2016-09-10 DIAGNOSIS — E1165 Type 2 diabetes mellitus with hyperglycemia: Secondary | ICD-10-CM

## 2016-09-10 DIAGNOSIS — Z794 Long term (current) use of insulin: Principal | ICD-10-CM

## 2016-09-10 LAB — GLUCOSE, POCT (MANUAL RESULT ENTRY): POC Glucose: 414 mg/dl — AB (ref 70–99)

## 2016-09-10 MED ORDER — METFORMIN HCL ER (MOD) 1000 MG PO TB24: 1000.0000 mg | ORAL_TABLET | Freq: Every day | ORAL | 4 refills | Status: DC

## 2016-09-10 NOTE — Patient Instructions (Addendum)
Check your blood sugars in the morning, take all medications, and inject 30 units of Lantus all in the morning. Work on not drinking sugary drinks like ginger ale, mountain dew, juice. Drink more water - try sparkling water.

## 2016-09-10 NOTE — Progress Notes (Signed)
Angelica [name]'s primary care provider: Patient, No Pcp Per   [name] returns for follow-up of her T2 Diabetes.     Other problems include:  Angelica [name] Active Problem List   Diagnosis Date Noted  . Menopausal hot flushes 07/20/2015  . GERD (gastroesophageal reflux disease) 01/27/2015  . Hypertension 01/27/2015  . Hyperlipidemia 01/27/2015  . Bell palsy 08/04/2014  . Type 2 diabetes mellitus ([place]) 08/04/2014     Her current medications include: Current Outpatient Prescriptions  Medication Sig Dispense Refill  . aspirin EC 81 MG tablet Take 81 mg by mouth daily.    Marland Kitchen atorvastatin (LIPITOR) 40 MG tablet Take 1 tablet (40 mg total) by mouth daily at 6 PM. 30 tablet 3  . hydrochlorothiazide (HYDRODIURIL) 12.5 MG tablet Take 1 tablet (12.5 mg total) by mouth 2 (two) times daily. 60 tablet 3  . Insulin Glargine (LANTUS SOLOSTAR) 100 UNIT/ML Solostar Pen Inject 20-80 Units into the skin daily at 10 pm. Start with 20 and increase by 5 every 3-7 days as long as sugars are over 150. (Angelica [name] taking differently: Inject 25 Units into the skin daily at 10 pm. Start with 20 and increase by 5 every 3-7 days as long as sugars are over 150.) 30 mL 11  . metFORMIN (GLUCOPHAGE) 1000 MG tablet Take 0.5 tablets (500 mg total) by mouth 2 (two) times daily with a meal. (Angelica [name] taking differently: Take 500 mg by mouth 2 (two) times daily with a meal. ) 60 tablet 3  . omeprazole (PRILOSEC) 20 MG capsule Take 20 mg by mouth daily.     No current facility-administered medications for this visit.      Interim history: At last visit, she was switched to Lantus. Takes 25 Lantus in the morning b/w 8-10am.  She takes 1 Metformin b/c 2 makes her stomach hurt. Her sister recently had stroke and so Pt sometimes forgets to take all of her meds. She checks glucose in evening and it generally runs at 350. Pt reports headaches and thirst when it is high.  Reports constant burning urination and has experienced  incontinence. She also reports irritation and recent unusual vaginal discharge - yellow in color. She has tried [employer] with no relief. Pt reports her vision has been blurry this week.  She reports soreness in her R foot but she recently stepped on a sharp object. She reports only abdomen pain when she injects insulin on occasion.  She reports chest pain and it sometimes it feels like her heart skips a beat. She says it happens randomly on her R side. She reports this has been happening for awhile.  Pt reports some hyperglycemia w/ sweating, light-headed, and feels sluggish. Pt reports her main drinks are ginger ale, apple juice, green tea, and mountain dew.   Exam:  BP 134/85 (BP Location: Left Arm)   Pulse 86   Temp 98.2 F (36.8 C)   Ht 5\' 3"  (1.6 m)   Wt 194 lb 9.6 oz (88.3 kg)   BMI 34.47 kg/m  Constitutional: Awake, Alert, oriented, in NAD Cardiovascular: Regular rhythm, no murmurs, gallops, rubs.  Respiratory: Lungs clear, no wheezes or rales Skin: no rashes or lesions. Feet without lesions Neurologic: Gait normal, DTRs normal. light touch sensation normal in feet in 5/5 spots bilaterally by monofilament Psychiatric: Oriented, normal mood and affect   Recent labs:  Results for orders placed or performed in visit on 05/07/16  POCT Glucose (CBG)  Result Value Ref Range   POC  Glucose 239 (A) 70 - 99 mg/dl  POCT HgB A1C  Result Value Ref Range   Hemoglobin A1C 12.6     Assessment and Plan:    There are no diagnoses linked to this encounter.  Goal for Pt glucose is 150 w/ no meal.  1. Counseled Pt to check glucose in morning 2. Increase Lantus to 30mg  3. Begin Metformin extended release in the morning 4. Counseled Pt to decrease soda consumption  A1C at 2 mo F/u  No Follow-up on file.   I saw this Angelica [name] with the medical student.  I agree with the note as written by the student.

## 2016-09-10 NOTE — Addendum Note (Signed)
Addended by: Raymond Gurney E on: 09/10/2016 09:04 PM   Modules accepted: Orders

## 2016-09-17 ENCOUNTER — Telehealth: Payer: Self-pay | Admitting: Pharmacy Technician

## 2016-09-17 NOTE — Telephone Encounter (Signed)
Patient eligible to receive medication assistance at [facility] through 2018, as long as eligibility requirements continue to be met.  Aseem Sessums J. Charl Wellen Care Manager [facility] 

## 2016-09-18 ENCOUNTER — Ambulatory Visit: Payer: Self-pay | Admitting: Internal Medicine

## 2016-09-18 VITALS — BP 132/88 | HR 72 | Wt 193.3 lb

## 2016-09-18 DIAGNOSIS — E119 Type 2 diabetes mellitus without complications: Secondary | ICD-10-CM

## 2016-09-18 LAB — POCT GLYCOSYLATED HEMOGLOBIN (HGB A1C): HEMOGLOBIN A1C: 9

## 2016-09-18 MED ORDER — FLUCONAZOLE 100 MG PO TABS: 100.0000 mg | ORAL_TABLET | Freq: Every day | ORAL | 0 refills | Status: AC

## 2016-09-18 NOTE — Progress Notes (Signed)
   Subjective:    Patient ID: Angelica [name], female    DOB: 02/15/1960, 56 y.o.   MRN: [id number]  HPI  Pt reports with a chief complaint of burning sensation when she urinates w/vaginal discharge. She has had a yeast infection before.      Patient Active Problem List   Diagnosis Date Noted  . Menopausal hot flushes 07/20/2015  . GERD (gastroesophageal reflux disease) 01/27/2015  . Hypertension 01/27/2015  . Hyperlipidemia 01/27/2015  . Bell palsy 08/04/2014  . Type 2 diabetes mellitus ([place]) 08/04/2014    Allergies as of 09/18/2016   No Known Allergies     Medication List       Accurate as of 09/18/16 11:18 AM. Always use your most recent med list.          aspirin EC 81 MG tablet Take 81 mg by mouth daily.   atorvastatin 40 MG tablet Commonly known as:  LIPITOR Take 1 tablet (40 mg total) by mouth daily at 6 PM.   hydrochlorothiazide 12.5 MG tablet Commonly known as:  HYDRODIURIL Take 1 tablet (12.5 mg total) by mouth 2 (two) times daily.   Insulin Glargine 100 UNIT/ML Solostar Pen Commonly known as:  LANTUS SOLOSTAR Inject 20-80 Units into the skin daily at 10 pm. Start with 20 and increase by 5 every 3-7 days as long as sugars are over 150.   metFORMIN 1000 MG (MOD) 24 hr tablet Commonly known as:  GLUMETZA Take 1 tablet (1,000 mg total) by mouth daily with breakfast.   omeprazole 20 MG capsule Commonly known as:  PRILOSEC Take 20 mg by mouth daily.        Review of Systems     Objective:   Physical Exam  Constitutional: She is oriented to person, place, and time.  Cardiovascular: Normal rate, regular rhythm and normal heart sounds.   Pulmonary/Chest: Effort normal and breath sounds normal.  Neurological: She is alert and oriented to person, place, and time.    BP 132/88   Pulse 72   Wt 193 lb 4.8 oz (87.7 kg)   BMI 34.24 kg/m       Assessment & Plan:    Pt will take Fluconazole 100mg  once daily for 2 days for fungal infection.  Advised patient to drink plenty of water and reduce sugar intake to clear fungal infection.  Follow up in 3 months w/ labs a week prior.

## 2016-09-18 NOTE — Patient Instructions (Signed)
Follow up in 3 months with labs a week prior

## 2016-09-20 LAB — COMPREHENSIVE METABOLIC PANEL
A/G RATIO: 1.1 — AB (ref 1.2–2.2)
ALT: 22 IU/L (ref 0–32)
AST: 15 IU/L (ref 0–40)
Albumin: 4.1 g/dL (ref 3.5–5.5)
Alkaline Phosphatase: 174 IU/L — ABNORMAL HIGH (ref 39–117)
BILIRUBIN TOTAL: 0.3 mg/dL (ref 0.0–1.2)
BUN / CREAT RATIO: 18 (ref 9–23)
BUN: 11 mg/dL (ref 6–24)
CHLORIDE: 103 mmol/L (ref 96–106)
CO2: 20 mmol/L (ref 20–29)
Calcium: 10 mg/dL (ref 8.7–10.2)
Creatinine, Ser: 0.61 mg/dL (ref 0.57–1.00)
GFR, EST AFRICAN AMERICAN: 120 mL/min/{1.73_m2} (ref >59)
GFR, EST NON AFRICAN AMERICAN: 104 mL/min/{1.73_m2} (ref >59)
GLOBULIN, TOTAL: 3.6 g/dL (ref 1.5–4.5)
Glucose: 295 mg/dL — ABNORMAL HIGH (ref 65–99)
POTASSIUM: 4.8 mmol/L (ref 3.5–5.2)
SODIUM: 143 mmol/L (ref 134–144)
TOTAL PROTEIN: 7.7 g/dL (ref 6.0–8.5)

## 2016-09-20 LAB — CBC
HEMOGLOBIN: 13.1 g/dL (ref 11.1–15.9)
Hematocrit: 40.5 % (ref 34.0–46.6)
MCH: 26 pg — AB (ref 26.6–33.0)
MCHC: 32.3 g/dL (ref 31.5–35.7)
MCV: 80 fL (ref 79–97)
PLATELETS: 349 10*3/uL (ref 150–379)
RBC: 5.04 x10E6/uL (ref 3.77–5.28)
RDW: 14.2 % (ref 12.3–15.4)
WBC: 7.2 10*3/uL (ref 3.4–10.8)

## 2016-09-20 LAB — URINALYSIS
BILIRUBIN UA: NEGATIVE
Ketones, UA: NEGATIVE
LEUKOCYTES UA: NEGATIVE
Nitrite, UA: NEGATIVE
PROTEIN UA: NEGATIVE
RBC UA: NEGATIVE
Urobilinogen, Ur: 0.2 mg/dL (ref 0.2–1.0)
pH, UA: 5 (ref 5.0–7.5)

## 2016-09-20 LAB — URINE CULTURE: ORGANISM ID, BACTERIA: NO GROWTH

## 2016-09-20 LAB — LIPID PANEL
CHOLESTEROL TOTAL: 136 mg/dL (ref 100–199)
Chol/HDL Ratio: 3.1 ratio (ref 0.0–4.4)
HDL: 44 mg/dL (ref >39)
LDL Calculated: 78 mg/dL (ref 0–99)
Triglycerides: 68 mg/dL (ref 0–149)
VLDL CHOLESTEROL CAL: 14 mg/dL (ref 5–40)

## 2016-09-20 LAB — TSH: TSH: 1.12 u[IU]/mL (ref 0.450–4.500)

## 2016-10-16 ENCOUNTER — Other Ambulatory Visit: Payer: Self-pay

## 2016-10-23 ENCOUNTER — Ambulatory Visit: Payer: Self-pay | Admitting: Internal Medicine

## 2016-10-31 ENCOUNTER — Telehealth: Payer: Self-pay

## 2016-10-31 NOTE — Telephone Encounter (Signed)
Received PAP application from [facility] for lantus solostar placed for provider to sign.

## 2016-10-31 NOTE — Telephone Encounter (Signed)
Placed signed application/script in [facility] folder for pickup. 

## 2016-11-05 ENCOUNTER — Ambulatory Visit: Payer: Self-pay

## 2016-11-05 ENCOUNTER — Telehealth: Payer: Self-pay | Admitting: Adult Health Nurse Practitioner

## 2016-11-05 NOTE — Telephone Encounter (Signed)
Cancelled endo appt on 10/16. Call back to reschedule

## 2016-11-12 ENCOUNTER — Telehealth: Payer: Self-pay | Admitting: Pharmacist

## 2016-11-12 NOTE — Telephone Encounter (Signed)
11/12/16 Faxed [employer] a refill request for Lantus Solostar Max daily dose 80 units daily at 10PM #5.Delos Haring

## 2016-12-11 ENCOUNTER — Other Ambulatory Visit: Payer: Self-pay

## 2016-12-11 DIAGNOSIS — E119 Type 2 diabetes mellitus without complications: Secondary | ICD-10-CM

## 2016-12-12 LAB — CBC WITH DIFFERENTIAL
BASOS: 0 %
Basophils Absolute: 0 10*3/uL (ref 0.0–0.2)
EOS (ABSOLUTE): 0 10*3/uL (ref 0.0–0.4)
EOS: 1 %
HEMATOCRIT: 41.3 % (ref 34.0–46.6)
Hemoglobin: 13.3 g/dL (ref 11.1–15.9)
IMMATURE GRANS (ABS): 0 10*3/uL (ref 0.0–0.1)
Immature Granulocytes: 0 %
LYMPHS: 36 %
Lymphocytes Absolute: 2.6 10*3/uL (ref 0.7–3.1)
MCH: 26.4 pg — ABNORMAL LOW (ref 26.6–33.0)
MCHC: 32.2 g/dL (ref 31.5–35.7)
MCV: 82 fL (ref 79–97)
MONOS ABS: 0.4 10*3/uL (ref 0.1–0.9)
Monocytes: 6 %
Neutrophils Absolute: 4 10*3/uL (ref 1.4–7.0)
Neutrophils: 57 %
RBC: 5.04 x10E6/uL (ref 3.77–5.28)
RDW: 14.1 % (ref 12.3–15.4)
WBC: 7.1 10*3/uL (ref 3.4–10.8)

## 2016-12-12 LAB — URINALYSIS
BILIRUBIN UA: NEGATIVE
Ketones, UA: NEGATIVE
Leukocytes, UA: NEGATIVE
Nitrite, UA: NEGATIVE
PH UA: 5 (ref 5.0–7.5)
PROTEIN UA: NEGATIVE
Specific Gravity, UA: >=1.03 — AB (ref 1.005–1.030)
Urobilinogen, Ur: 0.2 mg/dL (ref 0.2–1.0)

## 2016-12-12 LAB — MICROALBUMIN / CREATININE URINE RATIO
CREATININE, UR: 150.9 mg/dL
Microalb/Creat Ratio: 43.7 mg/g creat — ABNORMAL HIGH (ref 0.0–30.0)
Microalbumin, Urine: 65.9 ug/mL

## 2016-12-12 LAB — COMPREHENSIVE METABOLIC PANEL
A/G RATIO: 1.2 (ref 1.2–2.2)
ALK PHOS: 148 IU/L — AB (ref 39–117)
ALT: 17 IU/L (ref 0–32)
AST: 11 IU/L (ref 0–40)
Albumin: 4.1 g/dL (ref 3.5–5.5)
BILIRUBIN TOTAL: 0.5 mg/dL (ref 0.0–1.2)
BUN/Creatinine Ratio: 13 (ref 9–23)
BUN: 9 mg/dL (ref 6–24)
CHLORIDE: 98 mmol/L (ref 96–106)
CO2: 26 mmol/L (ref 20–29)
Calcium: 9.6 mg/dL (ref 8.7–10.2)
Creatinine, Ser: 0.71 mg/dL (ref 0.57–1.00)
GFR calc Af Amer: 112 mL/min/{1.73_m2} (ref >59)
GFR calc non Af Amer: 98 mL/min/{1.73_m2} (ref >59)
Globulin, Total: 3.5 g/dL (ref 1.5–4.5)
Glucose: 384 mg/dL — ABNORMAL HIGH (ref 65–99)
POTASSIUM: 4.3 mmol/L (ref 3.5–5.2)
Sodium: 138 mmol/L (ref 134–144)
Total Protein: 7.6 g/dL (ref 6.0–8.5)

## 2016-12-12 LAB — HEMOGLOBIN A1C
ESTIMATED AVERAGE GLUCOSE: 321 mg/dL
Hgb A1c MFr Bld: 12.8 % — ABNORMAL HIGH (ref 4.8–5.6)

## 2016-12-12 LAB — LIPID PANEL
Chol/HDL Ratio: 4.4 ratio (ref 0.0–4.4)
Cholesterol, Total: 185 mg/dL (ref 100–199)
HDL: 42 mg/dL (ref >39)
LDL CALC: 123 mg/dL — AB (ref 0–99)
Triglycerides: 100 mg/dL (ref 0–149)
VLDL CHOLESTEROL CAL: 20 mg/dL (ref 5–40)

## 2016-12-14 LAB — URINE CULTURE

## 2016-12-18 ENCOUNTER — Ambulatory Visit: Payer: Self-pay | Admitting: Internal Medicine

## 2016-12-25 ENCOUNTER — Ambulatory Visit: Payer: Self-pay | Admitting: Internal Medicine

## 2016-12-25 VITALS — BP 124/81 | HR 80 | Ht 63.0 in | Wt 192.0 lb

## 2016-12-25 DIAGNOSIS — I10 Essential (primary) hypertension: Secondary | ICD-10-CM

## 2016-12-25 DIAGNOSIS — Z794 Long term (current) use of insulin: Principal | ICD-10-CM

## 2016-12-25 DIAGNOSIS — E7800 Pure hypercholesterolemia, unspecified: Secondary | ICD-10-CM

## 2016-12-25 DIAGNOSIS — E1165 Type 2 diabetes mellitus with hyperglycemia: Secondary | ICD-10-CM

## 2016-12-25 MED ORDER — SULFAMETHOXAZOLE-TRIMETHOPRIM 800-160 MG PO TABS: 1.0000 | ORAL_TABLET | Freq: Two times a day (BID) | ORAL | 0 refills | Status: DC

## 2016-12-25 MED ORDER — HYDROCHLOROTHIAZIDE 12.5 MG PO TABS: 12.5000 mg | ORAL_TABLET | Freq: Two times a day (BID) | ORAL | 3 refills | Status: DC

## 2016-12-25 MED ORDER — INSULIN GLARGINE 100 UNIT/ML SOLOSTAR PEN: 20.0000 [IU] | PEN_INJECTOR | Freq: Every day | SUBCUTANEOUS | 11 refills | Status: DC

## 2016-12-25 MED ORDER — ASPIRIN EC 81 MG PO TBEC: 81.0000 mg | DELAYED_RELEASE_TABLET | Freq: Every day | ORAL | 3 refills | Status: DC

## 2016-12-25 MED ORDER — METFORMIN HCL ER (MOD) 1000 MG PO TB24: 1000.0000 mg | ORAL_TABLET | Freq: Every day | ORAL | 3 refills | Status: DC

## 2016-12-25 MED ORDER — ATORVASTATIN CALCIUM 40 MG PO TABS: 40.0000 mg | ORAL_TABLET | Freq: Every day | ORAL | 3 refills | Status: DC

## 2016-12-25 MED ORDER — OMEPRAZOLE 20 MG PO CPDR: 20.0000 mg | DELAYED_RELEASE_CAPSULE | Freq: Every day | ORAL | 3 refills | Status: DC

## 2016-12-25 NOTE — Progress Notes (Signed)
   Subjective:    Patient ID: Angelica [name], female    DOB: 1960-05-21, 56 y.o.   MRN: [id number]  HPI   Pt is here for a f/u on lab results.  Pt states that she has a UTI. She is experiencing a lot of itching and burning, but not much discharge.  Pt checks her sugar levels twice a week. Pt has an upcoming appointment with endocrinology.    Allergies as of 12/25/2016   No Known Allergies     Medication List        Accurate as of 12/25/16 10:42 AM. Always use your most recent med list.          aspirin EC 81 MG tablet Take 81 mg by mouth daily.   atorvastatin 40 MG tablet Commonly known as:  LIPITOR Take 1 tablet (40 mg total) by mouth daily at 6 PM.   hydrochlorothiazide 12.5 MG tablet Commonly known as:  HYDRODIURIL Take 1 tablet (12.5 mg total) by mouth 2 (two) times daily.   Insulin Glargine 100 UNIT/ML Solostar Pen Commonly known as:  LANTUS SOLOSTAR Inject 20-80 Units into the skin daily at 10 pm. Start with 20 and increase by 5 every 3-7 days as long as sugars are over 150.   metFORMIN 1000 MG (MOD) 24 hr tablet Commonly known as:  GLUMETZA Take 1 tablet (1,000 mg total) by mouth daily with breakfast.   omeprazole 20 MG capsule Commonly known as:  PRILOSEC Take 20 mg by mouth daily.      Patient Active Problem List   Diagnosis Date Noted  . Menopausal hot flushes 07/20/2015  . GERD (gastroesophageal reflux disease) 01/27/2015  . Hypertension 01/27/2015  . Hyperlipidemia 01/27/2015  . Bell palsy 08/04/2014  . Type 2 diabetes mellitus ([place]) 08/04/2014     Review of Systems     Objective:   Physical Exam  Constitutional: She is oriented to person, place, and time.  Cardiovascular: Normal rate, regular rhythm and normal heart sounds.  Pulmonary/Chest: Effort normal and breath sounds normal.  Neurological: She is alert and oriented to person, place, and time.   BP 124/81   Pulse 80   Ht 5\' 3"  (1.6 m)   Wt 192 lb (87.1 kg)   BMI 34.01 kg/m         Assessment & Plan:   Order labs for today (UA, urine culture)  Refill all medications.  F/u in 3 months with labs a week prior (CMET, CBC, A1C, UA, urine culture).

## 2016-12-27 LAB — URINALYSIS
BILIRUBIN UA: NEGATIVE
KETONES UA: NEGATIVE
Leukocytes, UA: NEGATIVE
Nitrite, UA: NEGATIVE
PROTEIN UA: NEGATIVE
RBC, UA: NEGATIVE
Specific Gravity, UA: 1.028 (ref 1.005–1.030)
UUROB: 0.2 mg/dL (ref 0.2–1.0)
pH, UA: 5 (ref 5.0–7.5)

## 2016-12-27 LAB — URINE CULTURE: Organism ID, Bacteria: NO GROWTH

## 2017-01-07 ENCOUNTER — Ambulatory Visit: Payer: Self-pay

## 2017-01-07 NOTE — Progress Notes (Deleted)
   Subjective:    Patient ID: Angelica [name], female    DOB: 1960-08-12, 56 y.o.   MRN: [id number]  HPI   Pt is here for a f/u of T2DM. Last A1c 12.8% (12/11/16). Presented to clinic 12/25/16 with dysuria and suspected UTI, but urinalysis showed only 3+ glucose with no bacterial growth on culture.  Pt checks her sugar levels ??? daily.   Currently taking metformin 1000mg  with breakfast, insulin glargine 20-80u at 10p daily.   Allergies as of 01/07/2017   No Known Allergies     Medication List        Accurate as of 01/07/17  5:52 PM. Always use your most recent med list.          aspirin EC 81 MG tablet Take 1 tablet (81 mg total) by mouth daily.   atorvastatin 40 MG tablet Commonly known as:  LIPITOR Take 1 tablet (40 mg total) by mouth daily at 6 PM.   hydrochlorothiazide 12.5 MG tablet Commonly known as:  HYDRODIURIL Take 1 tablet (12.5 mg total) by mouth 2 (two) times daily.   Insulin Glargine 100 UNIT/ML Solostar Pen Commonly known as:  LANTUS SOLOSTAR Inject 20-80 Units into the skin daily at 10 pm. Start with 20 and increase by 5 every 3-7 days as long as sugars are over 150.   metFORMIN 1000 MG (MOD) 24 hr tablet Commonly known as:  GLUMETZA Take 1 tablet (1,000 mg total) by mouth daily with breakfast.   omeprazole 20 MG capsule Commonly known as:  PRILOSEC Take 1 capsule (20 mg total) by mouth daily.   sulfamethoxazole-trimethoprim 800-160 MG tablet Commonly known as:  BACTRIM DS,SEPTRA DS Take 1 tablet by mouth 2 (two) times daily.      Patient Active Problem List   Diagnosis Date Noted  . Menopausal hot flushes 07/20/2015  . GERD (gastroesophageal reflux disease) 01/27/2015  . Hypertension 01/27/2015  . Hyperlipidemia 01/27/2015  . Bell palsy 08/04/2014  . Type 2 diabetes mellitus ([place]) 08/04/2014     Review of Systems     Objective:   Physical Exam  Constitutional: She is oriented to person, place, and time.  Cardiovascular: Normal  rate, regular rhythm and normal heart sounds.  Pulmonary/Chest: Effort normal and breath sounds normal.  Neurological: She is alert and oriented to person, place, and time.   There were no vitals taken for this visit.      Assessment & Plan:   Order labs for today (UA, urine culture)  Refill all medications.  F/u in 3 months with labs a week prior (CMET, CBC, A1C, UA, urine culture).

## 2017-01-09 ENCOUNTER — Telehealth: Payer: Self-pay | Admitting: Pharmacist

## 2017-01-09 ENCOUNTER — Ambulatory Visit: Payer: Self-pay | Admitting: Urology

## 2017-01-09 VITALS — BP 121/84 | HR 91 | Temp 98.2°F | Wt 190.6 lb

## 2017-01-09 DIAGNOSIS — B3731 Acute candidiasis of vulva and vagina: Secondary | ICD-10-CM

## 2017-01-09 DIAGNOSIS — B373 Candidiasis of vulva and vagina: Secondary | ICD-10-CM

## 2017-01-09 MED ORDER — FLUCONAZOLE 150 MG PO TABS: ORAL_TABLET | ORAL | 0 refills | Status: DC

## 2017-01-09 NOTE — Progress Notes (Signed)
01/09/2017 7:12 PM   Angelica [name] 1960-11-04 [id number]  Referring provider: No referring provider defined for this encounter.  Chief Complaint  Patient presents with  . Urinary Tract Infection    still having issues after treatment    HPI: Patient is a 56 -year-old [place] American female who presents today for a persistent UTI.    Patient states that she has had 5 urinary tract infections over the last year.   Reviewing her records,  she has had no documented positive urine cultures.  Her symptoms with a urinary tract infection consist of itching on the sides of the vaginal and burning when urinating and when she wipes.  She has had left side pain for one month.  She denies gross hematuria, suprapubic pain, back pain or flank pain.  She has not had any recent fevers, chills, nausea or vomiting.   She does not have a history of nephrolithiasis, GU surgery or GU trauma.   She is sexually active.  She has noted a correlation with her urinary tract infections and sexual intercourse.   She does not engage in anal sex.   She is voiding before and after sex.   She is postmenopausal.   She admits to constipation.   She does engage in good perineal hygiene. She does not take tub baths.   She has incontinence.  She is using incontinence pads.   She is going through a box of 20 panty liners weekly.    She has not had any recent imaging studies.    She is not drinking as much water as she should daily.  She is drinking a lot of Lipton Diet tea.  Just started drinking coffee.  She drinks an alcohol once in blue moon.    She is having a vaginal discharge for two weeks.  HbgA1c was 12.8% in 11/2016.    PMH: Past Medical History:  Diagnosis Date  . Bell palsy 08/04/2014  . GERD (gastroesophageal reflux disease) 01/27/2015  . History of Bell's palsy June 2016  . Hyperlipidemia 01/27/2015  . Hypertension 01/27/2015  . Type 2 diabetes mellitus ([place]) 08/04/2014    Surgical History: Past  Surgical History:  Procedure Laterality Date  . OOPHORECTOMY Right 2005    Home Medications:  Allergies as of 01/09/2017   No Known Allergies     Medication List        Accurate as of 01/09/17  7:12 PM. Always use your most recent med list.          aspirin EC 81 MG tablet Take 1 tablet (81 mg total) by mouth daily.   atorvastatin 40 MG tablet Commonly known as:  LIPITOR Take 1 tablet (40 mg total) by mouth daily at 6 PM.   fluconazole 150 MG tablet Commonly known as:  DIFLUCAN Take one tablet every other day for three days   hydrochlorothiazide 12.5 MG tablet Commonly known as:  HYDRODIURIL Take 1 tablet (12.5 mg total) by mouth 2 (two) times daily.   Insulin Glargine 100 UNIT/ML Solostar Pen Commonly known as:  LANTUS SOLOSTAR Inject 20-80 Units into the skin daily at 10 pm. Start with 20 and increase by 5 every 3-7 days as long as sugars are over 150.   metFORMIN 1000 MG (MOD) 24 hr tablet Commonly known as:  GLUMETZA Take 1 tablet (1,000 mg total) by mouth daily with breakfast.   omeprazole 20 MG capsule Commonly known as:  PRILOSEC Take 1 capsule (20 mg total) by  mouth daily.   sulfamethoxazole-trimethoprim 800-160 MG tablet Commonly known as:  BACTRIM DS,SEPTRA DS Take 1 tablet by mouth 2 (two) times daily.       Allergies: No Known Allergies  Family History: Family History  Problem Relation Age of Onset  . Cancer Brother        Colon Cancer  . Breast cancer Neg Hx     Social History:  reports that  has never smoked. she has never used smokeless tobacco. She reports that she does not drink alcohol or use drugs.   ROS Negative except for pertinent's in HPI.    Physical Exam: BP 121/84 (BP Location: Left Arm, Patient Position: Sitting, Cuff Size: Normal)   Pulse 91   Temp 98.2 F (36.8 C) (Oral)   Wt 190 lb 9.6 oz (86.5 kg)   BMI 33.76 kg/m   Constitutional: Well nourished. Alert and oriented, No acute distress. HEENT: [place] AT, moist  mucus membranes. Trachea midline, no masses. Cardiovascular: No clubbing, cyanosis, or edema. Respiratory: Normal respiratory effort, no increased work of breathing. GI: Abdomen is soft, non tender, non distended, no abdominal masses.  Skin: No rashes, bruises or suspicious lesions. Lymph: No cervical or inguinal adenopathy. Neurologic: Grossly intact, no focal deficits, moving all 4 extremities. Psychiatric: Normal mood and affect.  Laboratory Data: Lab Results  Component Value Date   WBC 7.1 12/11/2016   HGB 13.3 12/11/2016   HCT 41.3 12/11/2016   MCV 82 12/11/2016   PLT 349 09/18/2016    Lab Results  Component Value Date   CREATININE 0.71 12/11/2016     Lab Results  Component Value Date   HGBA1C 12.8 (H) 12/11/2016    Lab Results  Component Value Date   TSH 1.120 09/18/2016       Component Value Date/Time   CHOL 185 12/11/2016 0953   HDL 42 12/11/2016 0953   CHOLHDL 4.4 12/11/2016 0953   LDLCALC 123 (H) 12/11/2016 0953    Lab Results  Component Value Date   AST 11 12/11/2016   Lab Results  Component Value Date   ALT 17 12/11/2016    Urinalysis    Component Value Date/Time   COLORURINE YELLOW (A) 10/05/2014 1300   APPEARANCEUR Clear 12/25/2016 1109   LABSPEC 1.038 (H) 10/05/2014 1300   LABSPEC 1.028 05/30/2013 1905   PHURINE 5.0 10/05/2014 1300   GLUCOSEU 3+ (A) 12/25/2016 1109   GLUCOSEU Negative 05/30/2013 1905   HGBUR 1+ (A) 10/05/2014 1300   BILIRUBINUR Negative 12/25/2016 1109   BILIRUBINUR Negative 05/30/2013 1905   KETONESUR 2+ (A) 10/05/2014 1300   PROTEINUR Negative 12/25/2016 1109   PROTEINUR NEGATIVE 10/05/2014 1300   NITRITE Negative 12/25/2016 1109   NITRITE NEGATIVE 10/05/2014 1300   LEUKOCYTESUR Negative 12/25/2016 1109   LEUKOCYTESUR Negative 05/30/2013 1905    I have reviewed the labs.    Assessment & Plan:    1. Vaginal yeast infection  - explained to the patient that persistent high blood sugars contribute to yeast  infections - she is not having recurrent or persistent UTI's  - Diflucan 150 mg qod # 3  - RTC if no improvement  2. Uncontrolled DM  - patient states she is trying to do better  - keep scheduled appointment in Feb and March    Return for Keep scheduled appointments .  These notes generated with voice recognition software. I apologize for typographical errors.  Zara Council, PA-C  [facility] [address] [place], [place] [address] [phone]  

## 2017-01-09 NOTE — Telephone Encounter (Signed)
01/09/17 Faxed [employer] refill request for Lantus Solostar Inject Max 80 units daily at 10pm#5---Inject 20-80 units under the skin every day at 10pm, start with 20 units and increase by 5 units every 3-7 days if blood sugar is >150.Delos Haring

## 2017-02-05 ENCOUNTER — Telehealth: Payer: Self-pay | Admitting: Pharmacist

## 2017-02-05 NOTE — Telephone Encounter (Signed)
02/04/17 Discussed this patient with Kristin Bruins, she spoke with patient on 02/03/17, patient is to come 02/04/17 for samples, and I am sending page 1 of application to [facility] for [place] to sign and then fax to [facility] provider changed from enrollment for Lantus.Delos Haring

## 2017-02-21 ENCOUNTER — Telehealth: Payer: Self-pay

## 2017-02-21 NOTE — Telephone Encounter (Signed)
Pt called requesting a return call. Called and no answer. No voicemail

## 2017-02-28 ENCOUNTER — Telehealth: Payer: Self-pay | Admitting: Pharmacist

## 2017-02-28 NOTE — Telephone Encounter (Signed)
02/28/17 Faxed page 1 of [employer] application signed by Dr. Mable Fill to match the previous refill request faxed to [employer], per [employer] the provider was different from original order and they must matching as on refill request & page 1. Angelica [name]

## 2017-03-05 ENCOUNTER — Telehealth: Payer: Self-pay | Admitting: Pharmacist

## 2017-03-05 NOTE — Telephone Encounter (Signed)
03/05/17 Received fax from [employer] requesting patient signature on page 2. Also received a voicemail to call [employer] on missing information needed to complete processing of application received 01/26/15. I called [employer] spoke with Vicente Males according to him they need patient signature page 2 of application to process the page 1 of application I faxed 06/22/58. Explained to him that I faxed page 1 of application per a conversation I had with Ruby on 02/03/17, she stated she needed page one of application with Andrey Farmer as provider to match to refill request previously faxed 01/09/17 to change provider from [facility] to [facility]. Derek then proceeded to tell me that he would need a new refill request or page 1 of application now due to the date of 12/25/16 on previous refill request. Patient enrollment ends on 04/01/17, they can take a Renewal application now. I have printed a renewal application and have Zara Council PA as provider due to the fact she will be at [facility] on 03/06/17 to sign the forms per Lurena Nida @ [facility]. Also I have called patient and explained to her I need her to come and sign renewal application for [employer], she will come by office on 03/06/17. I also have discussed with Keri & [name] due to the fact that patient will most likely need samples before this order is received and processed by [employer].Delos Haring

## 2017-03-13 ENCOUNTER — Ambulatory Visit: Payer: Self-pay | Admitting: Ophthalmology

## 2017-03-19 ENCOUNTER — Other Ambulatory Visit: Payer: Self-pay

## 2017-03-19 DIAGNOSIS — Z794 Long term (current) use of insulin: Principal | ICD-10-CM

## 2017-03-19 DIAGNOSIS — E1165 Type 2 diabetes mellitus with hyperglycemia: Secondary | ICD-10-CM

## 2017-03-20 LAB — COMPREHENSIVE METABOLIC PANEL
ALBUMIN: 4.1 g/dL (ref 3.5–5.5)
ALK PHOS: 161 IU/L — AB (ref 39–117)
ALT: 39 IU/L — ABNORMAL HIGH (ref 0–32)
AST: 22 IU/L (ref 0–40)
Albumin/Globulin Ratio: 1.3 (ref 1.2–2.2)
BILIRUBIN TOTAL: 0.5 mg/dL (ref 0.0–1.2)
BUN / CREAT RATIO: 13 (ref 9–23)
BUN: 8 mg/dL (ref 6–24)
CHLORIDE: 97 mmol/L (ref 96–106)
CO2: 25 mmol/L (ref 20–29)
CREATININE: 0.63 mg/dL (ref 0.57–1.00)
Calcium: 9.2 mg/dL (ref 8.7–10.2)
GFR calc Af Amer: 118 mL/min/{1.73_m2} (ref >59)
GFR calc non Af Amer: 102 mL/min/{1.73_m2} (ref >59)
Globulin, Total: 3.2 g/dL (ref 1.5–4.5)
Glucose: 387 mg/dL — ABNORMAL HIGH (ref 65–99)
Potassium: 3.3 mmol/L — ABNORMAL LOW (ref 3.5–5.2)
Sodium: 139 mmol/L (ref 134–144)
Total Protein: 7.3 g/dL (ref 6.0–8.5)

## 2017-03-20 LAB — CBC
HEMATOCRIT: 40.4 % (ref 34.0–46.6)
HEMOGLOBIN: 13.4 g/dL (ref 11.1–15.9)
MCH: 26.9 pg (ref 26.6–33.0)
MCHC: 33.2 g/dL (ref 31.5–35.7)
MCV: 81 fL (ref 79–97)
Platelets: 327 10*3/uL (ref 150–379)
RBC: 4.98 x10E6/uL (ref 3.77–5.28)
RDW: 14.1 % (ref 12.3–15.4)
WBC: 6.3 10*3/uL (ref 3.4–10.8)

## 2017-03-20 LAB — URINALYSIS
Bilirubin, UA: NEGATIVE
Leukocytes, UA: NEGATIVE
Nitrite, UA: NEGATIVE
PROTEIN UA: NEGATIVE
RBC, UA: NEGATIVE
Urobilinogen, Ur: 0.2 mg/dL (ref 0.2–1.0)
pH, UA: 5.5 (ref 5.0–7.5)

## 2017-03-20 LAB — HEMOGLOBIN A1C
ESTIMATED AVERAGE GLUCOSE: 321 mg/dL
Hgb A1c MFr Bld: 12.8 % — ABNORMAL HIGH (ref 4.8–5.6)

## 2017-03-21 ENCOUNTER — Telehealth: Payer: Self-pay | Admitting: Pharmacist

## 2017-03-21 NOTE — Telephone Encounter (Signed)
03/21/2017 8:26:09 AM - Lantus Solostar renewal  03/21/17 Faxed [employer] applicate for PAP Renewal-Lantus Solostar Max 80 units daily at 10pm #5.Delos Haring

## 2017-03-22 LAB — URINE CULTURE

## 2017-03-26 ENCOUNTER — Encounter: Payer: Self-pay | Admitting: Internal Medicine

## 2017-03-26 ENCOUNTER — Ambulatory Visit: Payer: Self-pay | Admitting: Internal Medicine

## 2017-03-26 VITALS — BP 130/88 | HR 67 | Temp 97.8°F | Wt 191.1 lb

## 2017-03-26 DIAGNOSIS — Z794 Long term (current) use of insulin: Secondary | ICD-10-CM

## 2017-03-26 DIAGNOSIS — E1165 Type 2 diabetes mellitus with hyperglycemia: Secondary | ICD-10-CM

## 2017-03-26 DIAGNOSIS — N390 Urinary tract infection, site not specified: Secondary | ICD-10-CM

## 2017-03-26 MED ORDER — SULFAMETHOXAZOLE-TRIMETHOPRIM 800-160 MG PO TABS: 1.0000 | ORAL_TABLET | Freq: Two times a day (BID) | ORAL | 0 refills | Status: DC

## 2017-03-26 MED ORDER — MICONAZOLE NITRATE 2 % VA CREA: 1.0000 | TOPICAL_CREAM | Freq: Every day | VAGINAL | 0 refills | Status: DC

## 2017-03-26 NOTE — Progress Notes (Signed)
   Subjective:    Patient ID: Angelica [name], female    DOB: 1961/01/06, 57 y.o.   MRN: [id number]  HPI  Pt is here for her 3 month f/u. She presents with a chief complaint of vaginal itching and burning.   Pt checks her sugar about twice a week before she goes to bed.   Allergies as of 03/26/2017   No Known Allergies     Medication List        Accurate as of 03/26/17  9:57 AM. Always use your most recent med list.          aspirin EC 81 MG tablet Take 1 tablet (81 mg total) by mouth daily.   atorvastatin 40 MG tablet Commonly known as:  LIPITOR Take 1 tablet (40 mg total) by mouth daily at 6 PM.   fluconazole 150 MG tablet Commonly known as:  DIFLUCAN Take one tablet every other day for three days   hydrochlorothiazide 12.5 MG tablet Commonly known as:  HYDRODIURIL Take 1 tablet (12.5 mg total) by mouth 2 (two) times daily.   Insulin Glargine 100 UNIT/ML Solostar Pen Commonly known as:  LANTUS SOLOSTAR Inject 20-80 Units into the skin daily at 10 pm. Start with 20 and increase by 5 every 3-7 days as long as sugars are over 150.   metFORMIN 1000 MG (MOD) 24 hr tablet Commonly known as:  GLUMETZA Take 1 tablet (1,000 mg total) by mouth daily with breakfast.   omeprazole 20 MG capsule Commonly known as:  PRILOSEC Take 1 capsule (20 mg total) by mouth daily.   sulfamethoxazole-trimethoprim 800-160 MG tablet Commonly known as:  BACTRIM DS,SEPTRA DS Take 1 tablet by mouth 2 (two) times daily.      Patient Active Problem List   Diagnosis Date Noted  . Menopausal hot flushes 07/20/2015  . GERD (gastroesophageal reflux disease) 01/27/2015  . Hypertension 01/27/2015  . Hyperlipidemia 01/27/2015  . Bell palsy 08/04/2014  . Type 2 diabetes mellitus ([place]) 08/04/2014     Review of Systems     Objective:   Physical Exam  Constitutional: She is oriented to person, place, and time.  Cardiovascular: Normal rate, regular rhythm and normal heart sounds.    Pulmonary/Chest: Effort normal and breath sounds normal.  Neurological: She is alert and oriented to person, place, and time.    BP 130/88   Pulse 67   Temp 97.8 F (36.6 C) (Oral)   Wt 191 lb 1.6 oz (86.7 kg)   BMI 33.85 kg/m        Assessment & Plan:   Order labs today: UA, urine culture  Refer pt to [facility] at [facility] for pap smear and pelvic exam. She has a mammogram scheduled and will speak to the provider about doing an internal exam.   Have pt inject 34 units (Lantus) into the skin daily before bed.   Refer pt to endocrinology clinic.   F/u in 3 months with labs a week prior.

## 2017-03-28 LAB — URINALYSIS
Bilirubin, UA: NEGATIVE
LEUKOCYTES UA: NEGATIVE
NITRITE UA: NEGATIVE
Protein, UA: NEGATIVE
RBC, UA: NEGATIVE
Specific Gravity, UA: >=1.03 — AB (ref 1.005–1.030)
Urobilinogen, Ur: 1 mg/dL (ref 0.2–1.0)
pH, UA: 5.5 (ref 5.0–7.5)

## 2017-03-28 LAB — URINE CULTURE

## 2017-04-08 ENCOUNTER — Ambulatory Visit: Payer: Self-pay | Admitting: Endocrinology

## 2017-04-08 VITALS — BP 119/80 | HR 99 | Temp 98.3°F | Wt 188.0 lb

## 2017-04-08 DIAGNOSIS — E119 Type 2 diabetes mellitus without complications: Secondary | ICD-10-CM

## 2017-04-08 LAB — GLUCOSE, POCT (MANUAL RESULT ENTRY): POC Glucose: 349 mg/dl — AB (ref 70–99)

## 2017-04-08 MED ORDER — INSULIN GLARGINE 100 UNIT/ML SOLOSTAR PEN: 40.0000 [IU] | PEN_INJECTOR | Freq: Every day | SUBCUTANEOUS | 11 refills | Status: DC

## 2017-04-08 MED ORDER — SEMAGLUTIDE(0.25 OR 0.5MG/DOS) 2 MG/1.5ML ~~LOC~~ SOPN: PEN_INJECTOR | SUBCUTANEOUS | 5 refills | Status: AC

## 2017-04-08 NOTE — Progress Notes (Signed)
Patient's primary care provider: Patient, No Pcp Per      Assessment and Plan:    1. Diabetes mellitus (Type 2) without complication (HCC) [name] is a 57 y.o. female that returns for follow-up of T2DM with a recent A1c of 12.8 (3/71/6967) that is complicated by obesity, dyslipidemia, HTN, elevated microalb:CR and physical inactivity.  Currently taking metformin 1000 MG and Lantus (34 units; recent increase from 28 units). Increase the Lantus to 40 units.   Order Ozempic  Currently not checking blood sugars because of time-constraints and not adhering to medications fully.     2. 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 Recently lost the father of her children, her mother, and is also caring for her grandson. This may haveconflicted with compliance. Angelica [name] did not want to seek counseling with the Education officer, museum, Water quality scientist.   Subjective  Checking blood sugars somewhat irregular, approximately once a day. Has "committed to checking blood sugar in the morning". When checking blood sugars it typically ranges in the 250-300 but frequently sees 340s or thereabouts. The lowest blood sugar in the past month has been 280. Endorses polyuria and mentions having frequent UTIs and some polydipsia. Denies gastroparesis and dysphagia but acknowledges GERD. Denies headaches, nausea, or syncope. Acknowledges fatigue. Acknowledges some upset stomach and diarrhea that she attributes to the metformin but denies that it is affecting compliance and "is not an issue". Missing medications at least once a week.   Just started taking 34 units of Lantus (recently increased from 28 units approximately two weeks ago by Dr. Bonney Roussel).   Diet: Fasts from 6am - 12pm and eats approximately two-meals a day. Denies feeling low during these periods. Tries to avoid fried food and eats bake or boiled d foods mostly. Drinks 1 soda per day (not diet). Counseled to at least  consider drinking diet soda. Also drinking sweet tea (mixed with unsweet and sweet tea).  Exercise: Inactivity  Last eye appointment was approximately 1-2 weeks ago.    Other problems include:  Patient Active Problem List   Diagnosis Date Noted   Menopausal hot flushes 07/20/2015   GERD (gastroesophageal reflux disease) 01/27/2015   Hypertension 01/27/2015   Hyperlipidemia 01/27/2015   Bell palsy 08/04/2014   Type 2 diabetes mellitus ([place]) 08/04/2014    Her current medications include: Current Outpatient Medications  Medication Sig Dispense Refill   aspirin EC 81 MG tablet Take 1 tablet (81 mg total) by mouth daily. 90 tablet 3   atorvastatin (LIPITOR) 40 MG tablet Take 1 tablet (40 mg total) by mouth daily at 6 PM. 90 tablet 3   hydrochlorothiazide (HYDRODIURIL) 12.5 MG tablet Take 1 tablet (12.5 mg total) by mouth 2 (two) times daily. 90 tablet 3   Insulin Glargine (LANTUS SOLOSTAR) 100 UNIT/ML Solostar Pen Inject 20-80 Units into the skin daily at 10 pm. Start with 20 and increase by 5 every 3-7 days as long as sugars are over 150. (Patient taking differently: Inject 34 Units into the skin daily at 10 pm. Start with 20 and increase by 5 every 3-7 days as long as sugars are over 150.) 30 mL 11   metFORMIN (GLUMETZA) 1000 MG (MOD) 24 hr tablet Take 1 tablet (1,000 mg total) by mouth daily with breakfast. 90 tablet 3   omeprazole (PRILOSEC) 20 MG capsule Take 1 capsule (20 mg total) by mouth daily. 90 capsule 3   No current facility-administered medications for this visit.  Exam:  BP 119/80    Pulse 99    Temp 98.3 F (36.8 C)    Wt 188 lb (85.3 kg)    BMI 33.30 kg/m  Constitutional: Well-appearing, Alert, oriented, in NAD Cardiovascular: Regular rhythm, no murmurs, gallops, rubs.  Respiratory: Lungs clear, no wheezes or rales Skin: Acanthosis nigricans appreciated on dorsal side of neck Neurologic: Gait normal,Light touch sensation normal in feet in 5/5 spots  bilaterally by monofilament Psychiatric: Oriented, normal mood and affect Heme/lymph/immunologic: no lymphadenopathy   Recent labs:  Results for orders placed or performed in visit on 04/08/17  POCT Glucose (CBG)  Result Value Ref Range   POC Glucose 349 (A) 70 - 99 mg/dl

## 2017-04-21 ENCOUNTER — Ambulatory Visit
Admission: RE | Admit: 2017-04-21 | Discharge: 2017-04-21 | Disposition: A | Discharge status: Home / Self Care | Payer: Self-pay | Source: Ambulatory Visit | Attending: Oncology | Admitting: Oncology

## 2017-04-21 ENCOUNTER — Telehealth: Payer: Self-pay | Admitting: Pharmacist

## 2017-04-21 ENCOUNTER — Ambulatory Visit: Payer: Self-pay | Attending: Oncology

## 2017-04-21 VITALS — Ht 64.0 in | Wt 177.0 lb

## 2017-04-21 DIAGNOSIS — Z0000 Encounter for general adult medical examination without abnormal findings: Secondary | ICD-10-CM

## 2017-04-21 IMAGING — MG MM DIGITAL SCREENING BILAT W/ TOMO W/ CAD
8 of 12 series · 8 of 28 positions shown · non-contrast
Comparison: Previous exam(s).

CLINICAL DATA: Screening.

EXAM:
DIGITAL SCREENING BILATERAL MAMMOGRAM WITH TOMO AND CAD

[L CC]
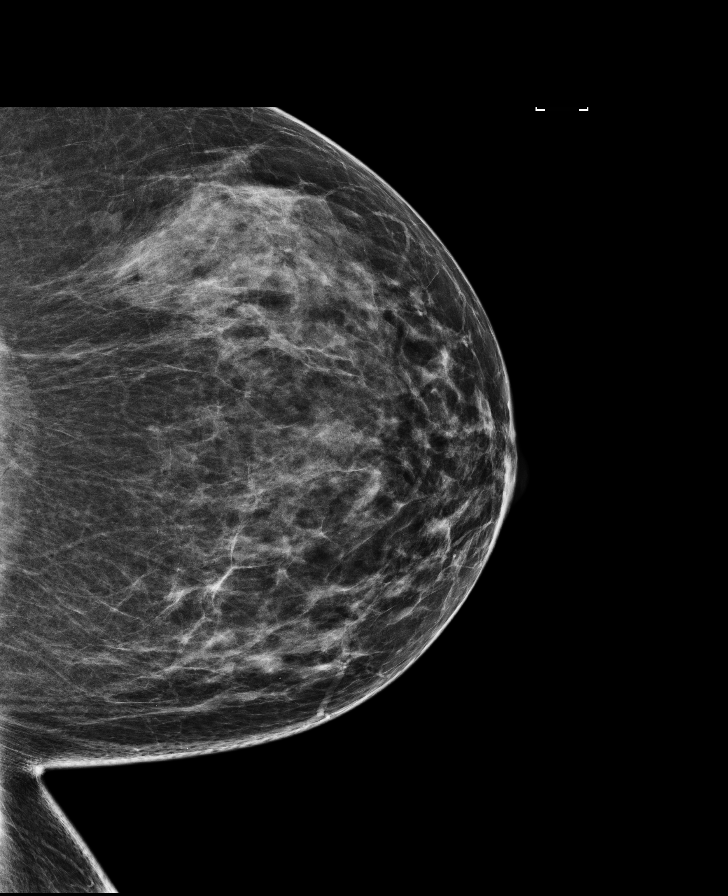

[R MLO]
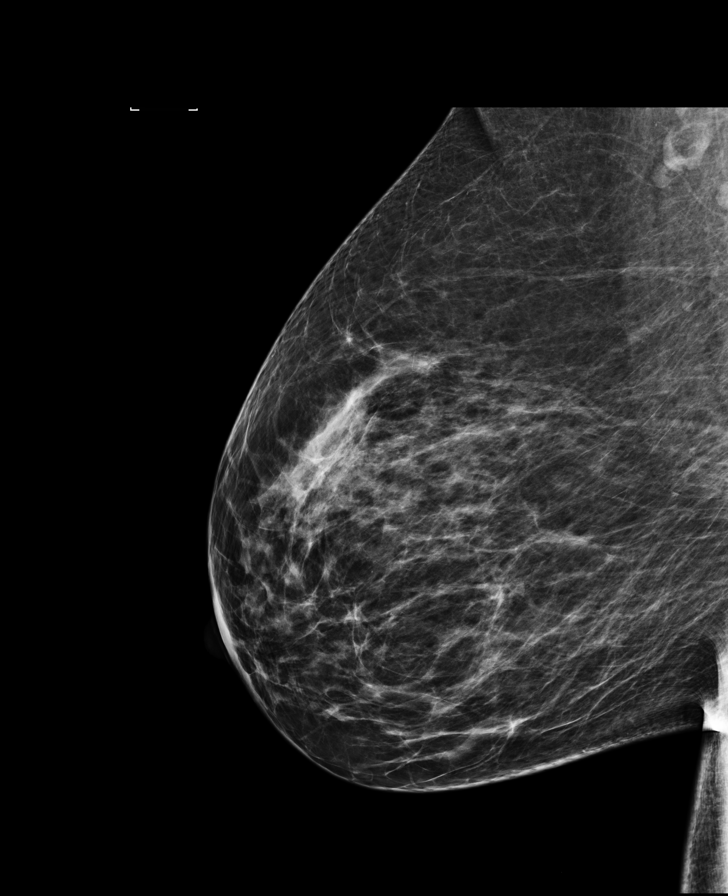

[R MLO synth-2D]
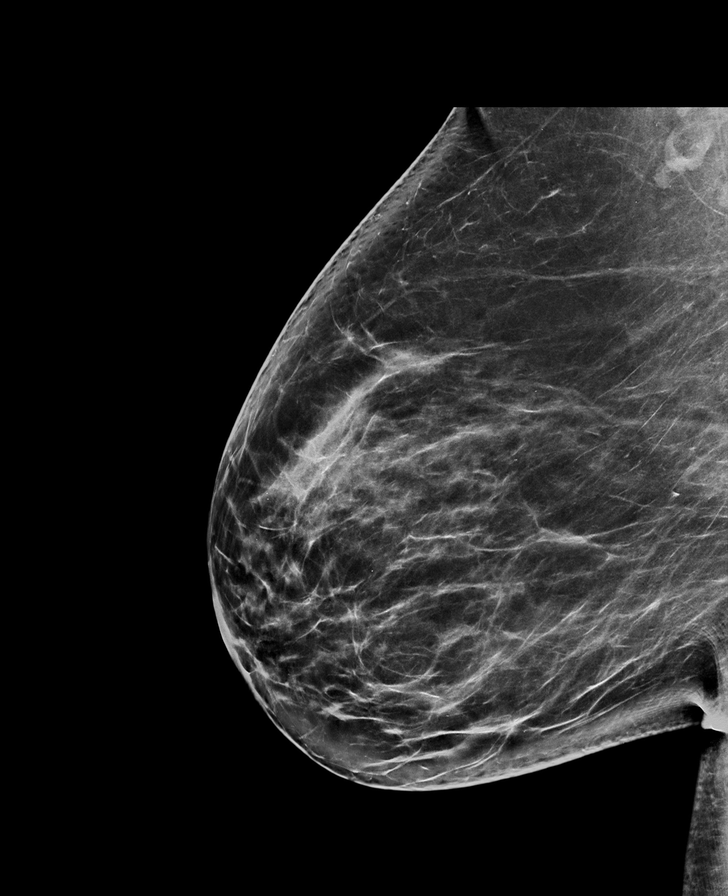

[R CC]
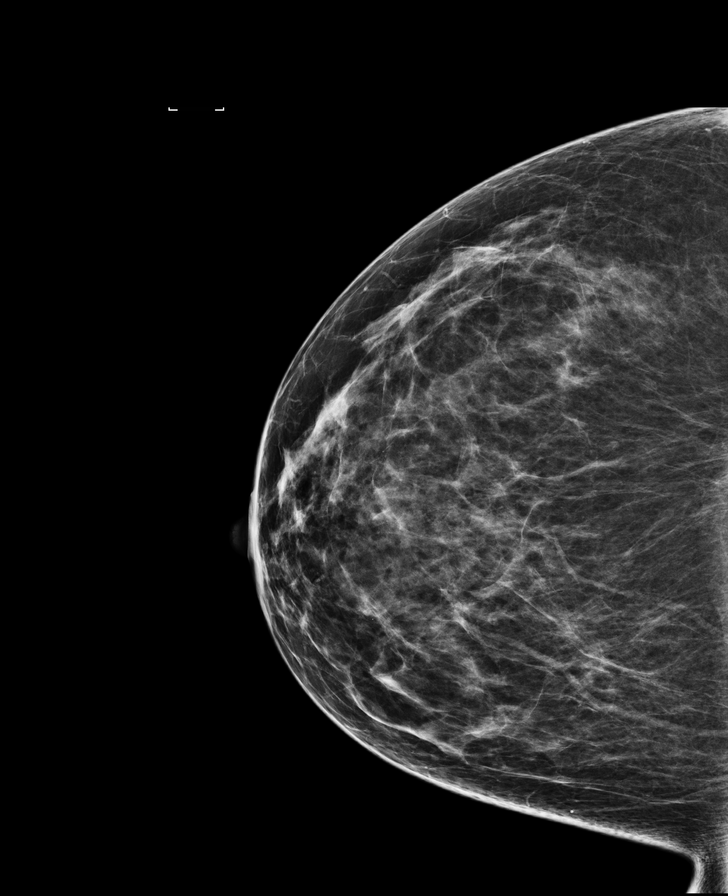

[L CC synth-2D]
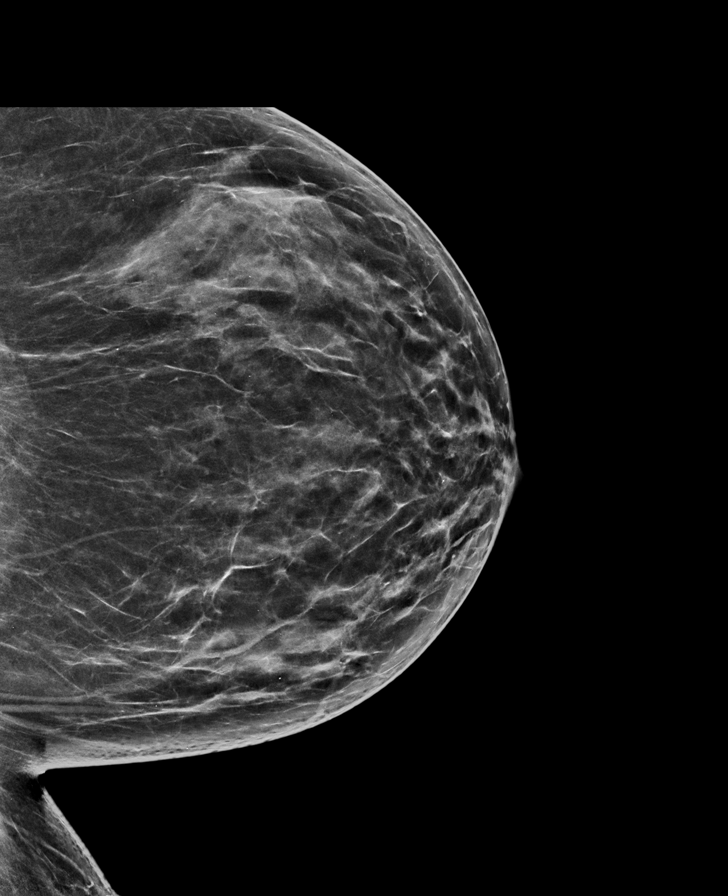

[L MLO synth-2D]
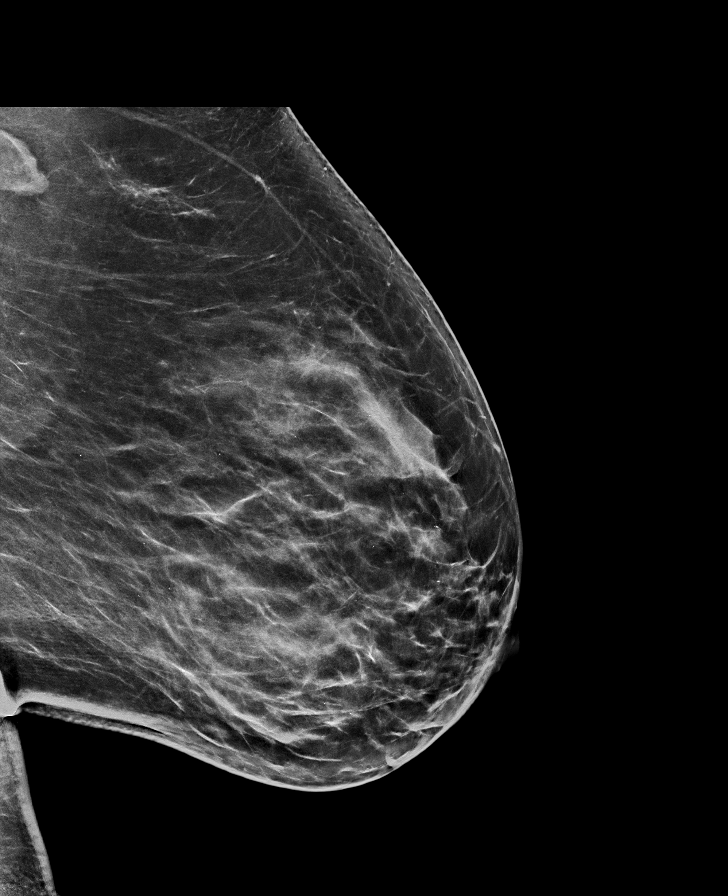

[R CC synth-2D]
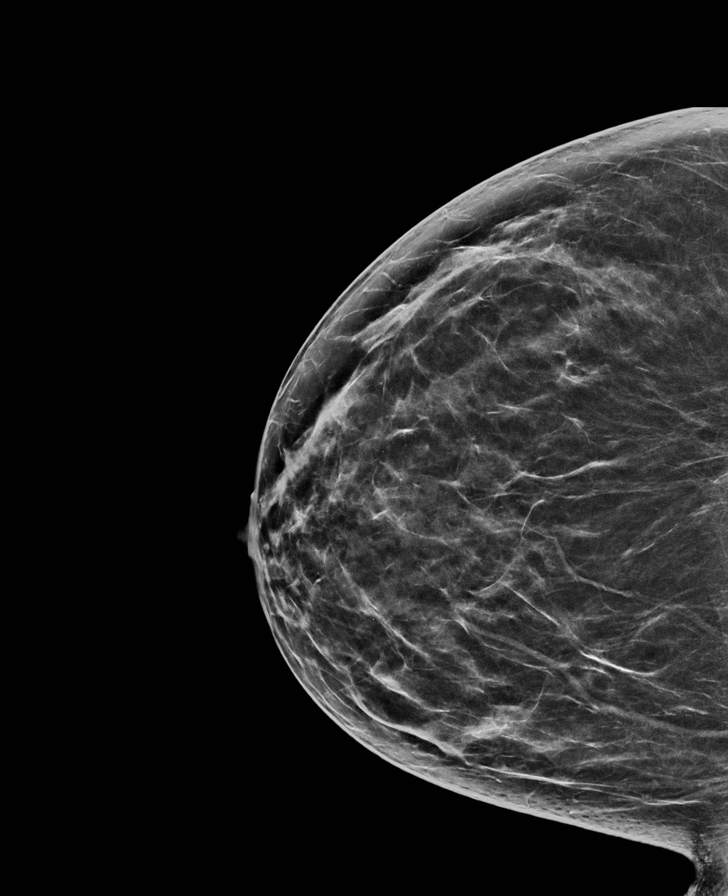

[L MLO]
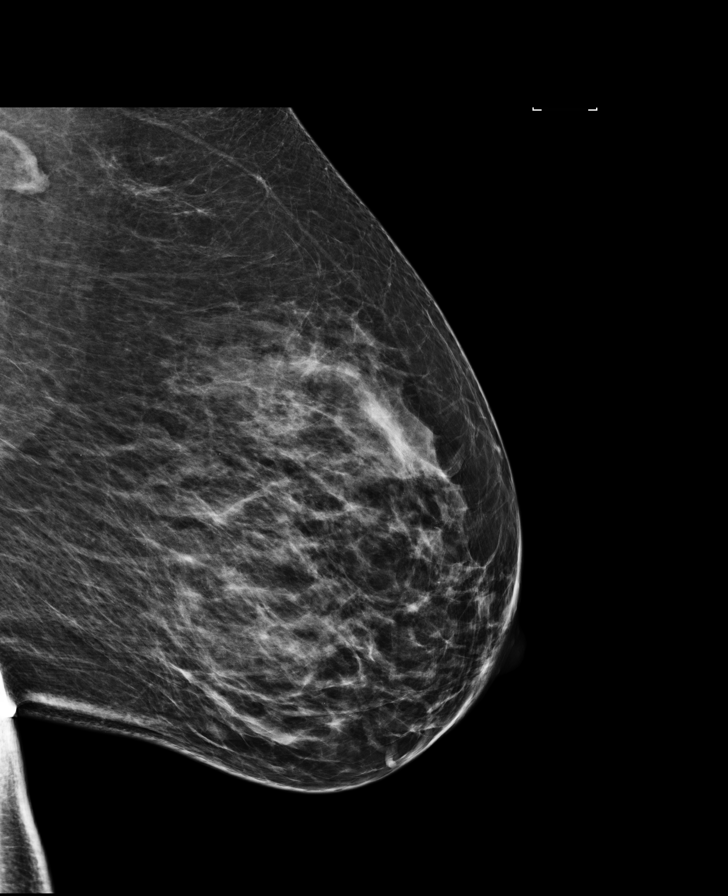

[8 of 28 positions shown; findings below may reference images not displayed]

ACR Breast Density Category b: There are scattered areas of
fibroglandular density.
FINDINGS: There are no findings suspicious for malignancy. Images were
processed with CAD.
IMPRESSION: No mammographic evidence of malignancy. A result letter of this
screening mammogram will be mailed directly to the patient.

RECOMMENDATION:
Screening mammogram in one year. (Code:[TQ])

BI-RADS CATEGORY  1: Negative.

## 2017-04-21 NOTE — Progress Notes (Signed)
Subjective:     Patient ID: Angelica [name], female   DOB: [birth year], 57 y.o.   MRN: [id number]  HPI   Review of Systems     Objective:   Physical Exam  Pulmonary/Chest: Right breast exhibits no inverted nipple, no mass, no nipple discharge, no skin change and no tenderness. Left breast exhibits no inverted nipple, no mass, no nipple discharge, no skin change and no tenderness. Breasts are symmetrical.         Assessment:     57 year old patient presents for [facility] clinic visit.  Patient screened, and meets BCCCP eligibility.  Patient does not have insurance, Medicare or Medicaid.  Handout given on Affordable Care Act.  Instructed patient on breast self awareness using teach back method.  CBE unremarkable.  No mass or lump palpated.  Patient has history of supracervical hysterectomy after 2006  ultrasound results in EPIC.  Neg/neg pap in 2015.  Next pap due in 2020.  She has one ovary, unsure which.    Plan:     Sent for bilateral screening mammogram.

## 2017-04-21 NOTE — Telephone Encounter (Signed)
04/21/2017 3:55:55 PM - LANTUS SOLOSTAR DOSE CHANGE  04/21/17 Received a pharmacy printout our for dose change-Lantus Solostar pen Max dose 40 units daily at 10PM--Start with 20 units and increase by 5 units every 3-7 days as long as sugars are over 150. #3.Sending to [facility] for provider to sign.Delos Haring

## 2017-04-23 NOTE — Progress Notes (Signed)
Letter mailed from [facility] to notify of normal mammogram results.  Patient to return in one year for annual screening.  Copy to [facility]. 

## 2017-05-06 ENCOUNTER — Telehealth: Payer: Self-pay | Admitting: Pharmacist

## 2017-05-06 NOTE — Telephone Encounter (Signed)
05/06/2017 10:33:12 AM - Ozempic New Med  05/06/17 Received pharmacy printout for New Med- Ozempic 0.25.0.5mg  Inject 0.25mg  into the skin once a week for 21 days, then 0.5mg  once a week for 28 days--ordering 4 boxes per [name], each box has 1 pen. Mailing patient her portion to sign & return, also sending provider portion to [facility] for Dr. Mable Fill to sign.Angelica [name]

## 2017-05-19 ENCOUNTER — Telehealth: Payer: Self-pay | Admitting: Pharmacist

## 2017-05-19 NOTE — Telephone Encounter (Signed)
05/19/2017 1:42:00 PM - Ozempic/Novo Nordisk  05/19/17 I have faxed [place] application for PAP enrollment for Ozempic - Inject 0.25mg  into the skin once a week for 21 days, then 0.5mg  once a week for 28 days; # 4.Delos Haring

## 2017-07-02 ENCOUNTER — Other Ambulatory Visit: Payer: Self-pay

## 2017-07-02 DIAGNOSIS — Z794 Long term (current) use of insulin: Principal | ICD-10-CM

## 2017-07-02 DIAGNOSIS — E1165 Type 2 diabetes mellitus with hyperglycemia: Secondary | ICD-10-CM

## 2017-07-03 LAB — COMPREHENSIVE METABOLIC PANEL
ALK PHOS: 144 IU/L — AB (ref 39–117)
ALT: 19 IU/L (ref 0–32)
AST: 15 IU/L (ref 0–40)
Albumin/Globulin Ratio: 1.2 (ref 1.2–2.2)
Albumin: 4 g/dL (ref 3.5–5.5)
BUN / CREAT RATIO: 15 (ref 9–23)
BUN: 8 mg/dL (ref 6–24)
CHLORIDE: 105 mmol/L (ref 96–106)
CO2: 23 mmol/L (ref 20–29)
Calcium: 9.4 mg/dL (ref 8.7–10.2)
Creatinine, Ser: 0.54 mg/dL — ABNORMAL LOW (ref 0.57–1.00)
GFR calc Af Amer: 124 mL/min/{1.73_m2} (ref >59)
GFR calc non Af Amer: 107 mL/min/{1.73_m2} (ref >59)
GLOBULIN, TOTAL: 3.3 g/dL (ref 1.5–4.5)
GLUCOSE: 170 mg/dL — AB (ref 65–99)
Potassium: 4.8 mmol/L (ref 3.5–5.2)
SODIUM: 147 mmol/L — AB (ref 134–144)
Total Protein: 7.3 g/dL (ref 6.0–8.5)

## 2017-07-03 LAB — CBC
Hematocrit: 39.3 % (ref 34.0–46.6)
Hemoglobin: 12.8 g/dL (ref 11.1–15.9)
MCH: 26.3 pg — ABNORMAL LOW (ref 26.6–33.0)
MCHC: 32.6 g/dL (ref 31.5–35.7)
MCV: 81 fL (ref 79–97)
PLATELETS: 361 10*3/uL (ref 150–450)
RBC: 4.87 x10E6/uL (ref 3.77–5.28)
RDW: 14.2 % (ref 12.3–15.4)
WBC: 6.1 10*3/uL (ref 3.4–10.8)

## 2017-07-03 LAB — URINALYSIS
BILIRUBIN UA: NEGATIVE
Glucose, UA: NEGATIVE
KETONES UA: NEGATIVE
LEUKOCYTES UA: NEGATIVE
NITRITE UA: NEGATIVE
RBC UA: NEGATIVE
UUROB: 1 mg/dL (ref 0.2–1.0)
pH, UA: 5 (ref 5.0–7.5)

## 2017-07-03 LAB — HEMOGLOBIN A1C
Est. average glucose Bld gHb Est-mCnc: 278 mg/dL
Hgb A1c MFr Bld: 11.3 % — ABNORMAL HIGH (ref 4.8–5.6)

## 2017-07-06 LAB — URINE CULTURE

## 2017-07-08 ENCOUNTER — Ambulatory Visit: Payer: Self-pay

## 2017-07-09 ENCOUNTER — Encounter: Payer: Self-pay | Admitting: Internal Medicine

## 2017-07-09 ENCOUNTER — Ambulatory Visit: Payer: Self-pay | Admitting: Internal Medicine

## 2017-07-09 VITALS — BP 137/97 | HR 83 | Temp 98.7°F | Ht 62.5 in | Wt 189.8 lb

## 2017-07-09 DIAGNOSIS — E119 Type 2 diabetes mellitus without complications: Secondary | ICD-10-CM

## 2017-07-09 NOTE — Progress Notes (Signed)
   Subjective:    Patient ID: Angelica [name], female    DOB: 11-17-1960, 57 y.o.   MRN: [id number]  HPI  Reports that when she administers Ozempic shots she experiences nausea and stomach pain, she does not check her blood glucose the morning after taking the shot.   Pt reports checking her blood glucose once a week.   Pt also reports that she forgot her endocrinology appointment that was scheduled for 07/08/2017.   Review of Systems   Patient Active Problem List   Diagnosis Date Noted  . Menopausal hot flushes 07/20/2015  . GERD (gastroesophageal reflux disease) 01/27/2015  . Hypertension 01/27/2015  . Hyperlipidemia 01/27/2015  . Bell palsy 08/04/2014  . Type 2 diabetes mellitus ([place]) 08/04/2014   Allergies as of 07/09/2017   No Known Allergies     Medication List        Accurate as of 07/09/17  9:17 AM. Always use your most recent med list.          aspirin EC 81 MG tablet Take 1 tablet (81 mg total) by mouth daily.   atorvastatin 40 MG tablet Commonly known as:  LIPITOR Take 1 tablet (40 mg total) by mouth daily at 6 PM.   hydrochlorothiazide 12.5 MG tablet Commonly known as:  HYDRODIURIL Take 1 tablet (12.5 mg total) by mouth 2 (two) times daily.   Insulin Glargine 100 UNIT/ML Solostar Pen Commonly known as:  LANTUS SOLOSTAR Inject 40 Units into the skin daily at 10 pm. Start with 20 and increase by 5 every 3-7 days as long as sugars are over 150.   metFORMIN 1000 MG (MOD) 24 hr tablet Commonly known as:  GLUMETZA Take 1 tablet (1,000 mg total) by mouth daily with breakfast.   omeprazole 20 MG capsule Commonly known as:  PRILOSEC Take 1 capsule (20 mg total) by mouth daily.           Objective:   Physical Exam  Constitutional: She is oriented to person, place, and time.  Cardiovascular: Normal rate, regular rhythm and normal heart sounds.  Pulmonary/Chest: Effort normal and breath sounds normal.  Neurological: She is alert and oriented to  person, place, and time.   BP (!) 137/97 (BP Location: Left Arm)   Pulse 83   Temp 98.7 F (37.1 C) (Oral)   Ht 5' 2.5" (1.588 m)   Wt 189 lb 12.8 oz (86.1 kg)   BMI 34.16 kg/m   BP in the exam room (07/09/17 @ 09:25am) 118/94          Assessment & Plan:   Pt's BP was elevated, however pt reports not taking her medication this morning. Will reevaluate at 3 MO FU.   Pt advised to reschedule her endocrinology appt to see if she should continue or D/C Ozempic.   Pt follow up in 3 months with labs one week prior

## 2017-07-16 ENCOUNTER — Ambulatory Visit: Payer: Self-pay | Admitting: Ophthalmology

## 2017-07-31 ENCOUNTER — Ambulatory Visit: Payer: Self-pay | Admitting: Ophthalmology

## 2017-08-05 ENCOUNTER — Ambulatory Visit: Payer: Self-pay

## 2017-08-05 NOTE — Progress Notes (Deleted)
Follow up Diabetes/ End[facility]     Patient ID: Angelica [name], female   DOB: 03/15/1960, 57 y.o.   MRN: [id number] Assessment:     1. Type 2 diabetes mellitus with hyperglycemia, with long-term current use of insulin ([place])   2. Hyperlipidemia, unspecified hyperlipidemia type   3. Essential hypertension         Plan:     Plan: patient instructions    There are no Patient Instructions on file for this visit.   No orders of the defined types were placed in this encounter.  Subjective:  HPI  Review of Systems  All other systems reviewed and are negative.      Angelica [name]  has a past medical history of Bell palsy (08/04/2014), GERD (gastroesophageal reflux disease) (01/27/2015), History of Bell's palsy (June 2016), Hyperlipidemia (01/27/2015), Hypertension (01/27/2015), and Type 2 diabetes mellitus ([place]) (08/04/2014).   Angelica [name] family history includes Cancer in her brother.  Angelica [name]  reports that she has never smoked. She has never used smokeless tobacco. She reports that she does not drink alcohol or use drugs.   Current Outpatient Medications:  .  aspirin EC 81 MG tablet, Take 1 tablet (81 mg total) by mouth daily., Disp: 90 tablet, Rfl: 3 .  atorvastatin (LIPITOR) 40 MG tablet, Take 1 tablet (40 mg total) by mouth daily at 6 PM., Disp: 90 tablet, Rfl: 3 .  hydrochlorothiazide (HYDRODIURIL) 12.5 MG tablet, Take 1 tablet (12.5 mg total) by mouth 2 (two) times daily., Disp: 90 tablet, Rfl: 3 .  Insulin Glargine (LANTUS SOLOSTAR) 100 UNIT/ML Solostar Pen, Inject 40 Units into the skin daily at 10 pm. Start with 20 and increase by 5 every 3-7 days as long as sugars are over 150., Disp: 30 mL, Rfl: 11 .  metFORMIN (GLUMETZA) 1000 MG (MOD) 24 hr tablet, Take 1 tablet (1,000 mg total) by mouth daily with breakfast., Disp: 90 tablet, Rfl: 3 .  omeprazole (PRILOSEC) 20 MG capsule, Take 1 capsule (20 mg total) by mouth daily., Disp: 90 capsule, Rfl: 3 .   Semaglutide (OZEMPIC) 0.25 or 0.5 MG/DOSE SOPN, Inject 0.5 mg into the skin once a week., Disp: , Rfl:   No Known Allergies Objective:    Physical Exam  Constitutional: She is oriented to person, place, and time. She appears well-developed and well-nourished.  HENT:  Head: Normocephalic.  Eyes: Pupils are equal, round, and reactive to light. EOM are normal.  Neck: No thyromegaly present.  Cardiovascular: Normal rate, regular rhythm, normal heart sounds and intact distal pulses.  Pulmonary/Chest: Effort normal and breath sounds normal. She has no wheezes.  Musculoskeletal: Normal range of motion.  Lymphadenopathy:    She has no cervical adenopathy.  Neurological: She is alert and oriented to person, place, and time.  Skin: Skin is warm and dry.  Psychiatric: She has a normal mood and affect. Her behavior is normal.        Data : I have personally reviewed pertinent labs and imaging studies, if indicated,  with the patient in clinic today.   Lab Orders  No laboratory test(s) ordered today    HC Readings from Last 3 Encounters:  No data found for [facility]    Wt Readings from Last 3 Encounters:  07/09/17 189 lb 12.8 oz (86.1 kg)  04/21/17 177 lb (80.3 kg)  04/08/17 188 lb (85.3 kg)

## 2017-08-14 ENCOUNTER — Ambulatory Visit: Payer: Self-pay | Admitting: Ophthalmology

## 2017-08-21 ENCOUNTER — Ambulatory Visit: Payer: Self-pay | Admitting: Ophthalmology

## 2017-09-05 ENCOUNTER — Telehealth: Payer: Self-pay | Admitting: Pharmacist

## 2017-09-05 NOTE — Telephone Encounter (Signed)
09/05/2017 9:52:21 AM - Ozempic refill  09/05/17 Faxed Novo Nordisk refill request for [employer] 0.5mg  once a week.Delos Haring

## 2017-10-07 ENCOUNTER — Ambulatory Visit: Payer: Self-pay | Admitting: Nephrology

## 2017-10-07 VITALS — BP 132/87 | HR 94 | Temp 98.2°F | Ht 62.75 in | Wt 196.8 lb

## 2017-10-07 DIAGNOSIS — E119 Type 2 diabetes mellitus without complications: Secondary | ICD-10-CM

## 2017-10-07 DIAGNOSIS — I10 Essential (primary) hypertension: Secondary | ICD-10-CM

## 2017-10-07 LAB — POCT GLYCOSYLATED HEMOGLOBIN (HGB A1C): Hemoglobin A1C: 8.5 % — AB (ref 4.0–5.6)

## 2017-10-07 MED ORDER — OMEPRAZOLE 20 MG PO CPDR: 20.0000 mg | DELAYED_RELEASE_CAPSULE | Freq: Every day | ORAL | 3 refills | Status: DC

## 2017-10-07 MED ORDER — HYDROCHLOROTHIAZIDE 12.5 MG PO TABS: 12.5000 mg | ORAL_TABLET | Freq: Two times a day (BID) | ORAL | 3 refills | Status: DC

## 2017-10-07 MED ORDER — METFORMIN HCL ER 500 MG PO TB24: ORAL_TABLET | ORAL | 3 refills | Status: DC

## 2017-10-07 MED ORDER — INSULIN GLARGINE 100 UNIT/ML SOLOSTAR PEN: 30.0000 [IU] | PEN_INJECTOR | Freq: Every day | SUBCUTANEOUS | 3 refills | Status: DC

## 2017-10-07 NOTE — Progress Notes (Addendum)
Follow up Diabetes/ End[facility]     Patient ID: Angelica [name], female   DOB: [birth year], 57 y.o.   MRN: [id number] Assessment:  Angelica [name] is a 57 y.o. female who is seen in follow up for Diabetes mellitus without complication ([place]) [T01.6] at the request of .  Encounter Diagnoses 1. Diabetes mellitus without complication ([place])     Assessment  [name] is doing very well. Her A1C today, 10/07/17, is 8.5% and she is tolerating all of her medications well.  Plan:     We have increased her Metformin from 1000mg  to 1500mg  daily. She will take 1000mg  with breakfast and 500mg  with dinner. She will continue checking her blood sugars in the morning and evening and continue taking her Lantus and Ozempic as prescribed.   Orders Placed This Encounter  Procedures  . POCT Glucose (CBG)     Subjective:  HPI  Angelica [name] is a 57yo female returning for follow-up care of T2DM.  Patient is currently taking Lantus 30 units daily at 10pm, 1000mg  of metformin daily, and 0.5mg  Ozempic 1x weekly. Needs all meds refilled this visit. Patient began taking Ozempic 03/2017. Pt denies any current side effects of Ozempic.  Patient checks blood sugar 2x daily 5 days a week. Currently 130-220s on average. Denies any hypoglycemic episodes.  Pt is taking medicine regularly and cut back on soda. Receives support from [employer] and her family. Her brother also has T2DM and is counseling her on medication management. She feels like she has more time for self-care and is excited to continue lowering her A1C.  Previous A1C 11.3% on 07/02/17. A1C today, 10/07/17, is 8.5%.   Review of Systems  [name]  has a past medical history of Bell palsy (08/04/2014), GERD (gastroesophageal reflux disease) (01/27/2015), History of Bell's palsy (June 2016), Hyperlipidemia (01/27/2015), Hypertension (01/27/2015), and Type 2 diabetes mellitus ([place]) (08/04/2014).  Family History, Social History, current Medications  and allergies reviewed and updated in Epic.     Objective:    Blood pressure 132/87, pulse 94, temperature 98.2 F (36.8 C), height 5' 2.75" (1.594 m), weight 196 lb 12.8 oz (89.3 kg). Physical Exam CV - heart sounds normal Lung sounds normal    Data : I have personally reviewed pertinent labs and imaging studies, if indicated,  with the patient in clinic today.    Lab Orders     POCT Glucose (CBG)  HC Readings from Last 3 Encounters:  No data found for [facility]    Wt Readings from Last 3 Encounters:  10/07/17 196 lb 12.8 oz (89.3 kg)  07/09/17 189 lb 12.8 oz (86.1 kg)  04/21/17 177 lb (80.3 kg)

## 2017-10-08 ENCOUNTER — Ambulatory Visit: Payer: Self-pay

## 2017-10-08 DIAGNOSIS — E1165 Type 2 diabetes mellitus with hyperglycemia: Secondary | ICD-10-CM

## 2017-10-08 DIAGNOSIS — Z794 Long term (current) use of insulin: Principal | ICD-10-CM

## 2017-10-09 LAB — MICROSCOPIC EXAMINATION
Casts: NONE SEEN /lpf
Epithelial Cells (non renal): >10 /hpf — AB (ref 0–10)

## 2017-10-09 LAB — LIPID PANEL
CHOLESTEROL TOTAL: 181 mg/dL (ref 100–199)
Chol/HDL Ratio: 4.5 ratio — ABNORMAL HIGH (ref 0.0–4.4)
HDL: 40 mg/dL (ref >39)
LDL Calculated: 120 mg/dL — ABNORMAL HIGH (ref 0–99)
Triglycerides: 103 mg/dL (ref 0–149)
VLDL CHOLESTEROL CAL: 21 mg/dL (ref 5–40)

## 2017-10-09 LAB — URINALYSIS, ROUTINE W REFLEX MICROSCOPIC
Bilirubin, UA: NEGATIVE
KETONES UA: NEGATIVE
Leukocytes, UA: NEGATIVE
Nitrite, UA: NEGATIVE
RBC, UA: NEGATIVE
Urobilinogen, Ur: 1 mg/dL (ref 0.2–1.0)
pH, UA: 5 (ref 5.0–7.5)

## 2017-10-09 LAB — COMPREHENSIVE METABOLIC PANEL
A/G RATIO: 1.1 — AB (ref 1.2–2.2)
ALK PHOS: 154 IU/L — AB (ref 39–117)
ALT: 23 IU/L (ref 0–32)
AST: 16 IU/L (ref 0–40)
Albumin: 4 g/dL (ref 3.5–5.5)
BUN/Creatinine Ratio: 16 (ref 9–23)
BUN: 11 mg/dL (ref 6–24)
CHLORIDE: 101 mmol/L (ref 96–106)
CO2: 26 mmol/L (ref 20–29)
Calcium: 9.7 mg/dL (ref 8.7–10.2)
Creatinine, Ser: 0.67 mg/dL (ref 0.57–1.00)
GFR calc Af Amer: 115 mL/min/{1.73_m2} (ref >59)
GFR calc non Af Amer: 100 mL/min/{1.73_m2} (ref >59)
Globulin, Total: 3.5 g/dL (ref 1.5–4.5)
Glucose: 298 mg/dL — ABNORMAL HIGH (ref 65–99)
POTASSIUM: 5 mmol/L (ref 3.5–5.2)
Sodium: 141 mmol/L (ref 134–144)
Total Protein: 7.5 g/dL (ref 6.0–8.5)

## 2017-10-09 LAB — HEMOGLOBIN A1C
Est. average glucose Bld gHb Est-mCnc: 206 mg/dL
Hgb A1c MFr Bld: 8.8 % — ABNORMAL HIGH (ref 4.8–5.6)

## 2017-10-15 ENCOUNTER — Ambulatory Visit: Payer: Self-pay | Admitting: Internal Medicine

## 2017-10-22 ENCOUNTER — Ambulatory Visit: Payer: Self-pay | Admitting: Internal Medicine

## 2017-10-22 ENCOUNTER — Encounter: Payer: Self-pay | Admitting: Internal Medicine

## 2017-10-22 VITALS — BP 125/84 | HR 102 | Temp 97.5°F | Ht 63.0 in | Wt 197.5 lb

## 2017-10-22 DIAGNOSIS — I10 Essential (primary) hypertension: Secondary | ICD-10-CM

## 2017-10-22 DIAGNOSIS — N390 Urinary tract infection, site not specified: Secondary | ICD-10-CM

## 2017-10-22 MED ORDER — SULFAMETHOXAZOLE-TRIMETHOPRIM 800-160 MG PO TABS: 1.0000 | ORAL_TABLET | Freq: Two times a day (BID) | ORAL | 0 refills | Status: DC

## 2017-10-22 NOTE — Progress Notes (Signed)
   Subjective:    Patient ID: Angelica [name], female    DOB: Sep 22, 1960, 57 y.o.   MRN: [id number]  HPI   Pt is here for follow up appointment.   Pt reports burning and stinging sensation with urination. Pt reports that she is not allergic to sulfur.   Review of Systems Patient Active Problem List   Diagnosis Date Noted  . Menopausal hot flushes 07/20/2015  . GERD (gastroesophageal reflux disease) 01/27/2015  . Hypertension 01/27/2015  . Hyperlipidemia 01/27/2015  . Bell palsy 08/04/2014  . Type 2 diabetes mellitus ([place]) 08/04/2014   Allergies as of 10/22/2017   No Known Allergies     Medication List        Accurate as of 10/22/17 11:41 AM. Always use your most recent med list.          aspirin EC 81 MG tablet Take 1 tablet (81 mg total) by mouth daily.   atorvastatin 40 MG tablet Commonly known as:  LIPITOR Take 1 tablet (40 mg total) by mouth daily at 6 PM.   hydrochlorothiazide 12.5 MG tablet Commonly known as:  HYDRODIURIL Take 1 tablet (12.5 mg total) by mouth 2 (two) times daily.   Insulin Glargine 100 UNIT/ML Solostar Pen Commonly known as:  LANTUS Inject 30 Units into the skin daily at 10 pm. Start with 20 and increase by 5 every 3-7 days as long as sugars are over 150.   metFORMIN 500 MG 24 hr tablet Commonly known as:  GLUCOPHAGE-XR Take 2 tablets (1,000 mg total) by mouth daily with breakfast AND 1 tablet (500 mg total) daily before supper.   omeprazole 20 MG capsule Commonly known as:  PRILOSEC Take 1 capsule (20 mg total) by mouth daily.   OZEMPIC (0.25 OR 0.5 MG/DOSE) 2 MG/1.5ML Sopn Generic drug:  Semaglutide(0.25 or 0.'5MG'$ /DOS) Inject 0.5 mg into the skin once a week.         Objective:   Physical Exam  Constitutional: She is oriented to person, place, and time.  Cardiovascular: Normal rate, regular rhythm and normal heart sounds.  Pulmonary/Chest: Effort normal and breath sounds normal.  Neurological: She is alert and oriented to  person, place, and time.   BP 125/84   Pulse (!) 102   Temp (!) 97.5 F (36.4 C) (Oral)   Ht '5\' 3"'$  (1.6 m)   Wt 197 lb 8 oz (89.6 kg)   BMI 34.99 kg/m      Assessment & Plan:   Prescribe Bactrim/Septra 800-160  2x daily for one week for sx associated with probable UTI.   A1C has showed improvement and pt has FU appt with [facility] in December 2019.   Pt's BP is controlled.    Return in 4 months with labs (A1C, Met C, UA) a week prior.

## 2017-10-24 LAB — URINE CULTURE

## 2017-10-28 ENCOUNTER — Telehealth: Payer: Self-pay | Admitting: Adult Health Nurse Practitioner

## 2017-10-28 NOTE — Telephone Encounter (Signed)
Called patient and rescheduled 10/10 eye appointment to 10/17. Patient is trying to reach Dr. Mable Fill to get a new UTI medication. The one she was prescribed is not working. Patient is going to come in 10/9 to try to talk to someone.

## 2017-10-29 ENCOUNTER — Telehealth: Payer: Self-pay

## 2017-10-29 NOTE — Telephone Encounter (Signed)
-----   Message from Marcie Mowers sent at 10/29/2017 10:26 AM EDT ----- Regarding: FW: Medication Can you call her?   ----- Message ----- From: Tawni Millers, MD Sent: 10/29/2017   9:13 AM EDT To: Marcie Mowers Subject: FW: Medication                                 Urine culture shows no significant infection. Suspect urine/ bladder symptoms related to high spillages of sugar in urine related to her poorly controled diabetes.  ----- Message ----- From: Lurena Nida D Sent: 10/28/2017   7:06 PM EDT To: Tawni Millers, MD, Leda Min, CMA Subject: Medication                                     Patient states that her current antibiotic is not working for her UTI.  Does she need a different antibiotic (asked by patient)?   She is currently on Bactrim DS/Septra DS 800/160 mg 1 tablet PO BID for 7 days.

## 2017-10-29 NOTE — Telephone Encounter (Signed)
Notified patient that Dr Mable Fill reviewed urine culture showing no significant infection. Patient complains of continuing itching and burning. She will monitor for the rest of this week and call if she feels she needs another appt with Dr Mable Fill.

## 2017-10-30 ENCOUNTER — Other Ambulatory Visit: Payer: Self-pay

## 2017-10-30 ENCOUNTER — Ambulatory Visit: Payer: Self-pay | Admitting: Ophthalmology

## 2017-10-30 LAB — COMP. METABOLIC PANEL (12)
ALT: 24 (ref 3–30)
AST: 19
Albumin: 4.3
Alkaline Phosphatase: 170
BILIRUBIN, DIRECT: 0.1
Bicarbonate: 24 (ref 20–28)
Calcium: 9.7
Chloride: 95
Creat: 0.67
Glucose: 342
Phosphorus: 3.8
Potassium: 4.5
Sodium: 133
Total Bilirubin: 0.3
Total Protein: 7.8 g/dL
Uric Acid: 2.4
eGFR: 111

## 2017-10-30 LAB — CBC
Basophils Absolute: 0
Basophils: 0.6
Eosinophil: 1
Eosinophils Absolute: 0
HCT: 41 (ref 29–41)
Hemoglobin: 13.3
Lymphocytes absolute: 2.7 10*3/uL — AB (ref 0.1–1.8)
Lymphocytes: 38.1
MCH: 27
MCHC: 33
MCV: 82 (ref 76–111)
Monocyes absolute: 0.3 10*3/uL (ref 0.1–1)
Monocytes: 4.1
Neutrophils absolute (GR#): 3.9 10*3/uL (ref 1.7–7.8)
Neutrophils: 56.2
Platelets: 345
RBC: 4.9
WBC: 7

## 2017-10-30 LAB — HEMOGLOBIN A1C: Hemoglobin A1C: 10.4

## 2017-10-30 LAB — LIPID PANEL
Cholesterol: 213
HDL Cholesterol: 42 (ref 35–70)
LDL Cholesterol (Calc): 143
Triglycerides: 142 (ref 40–160)

## 2017-11-06 ENCOUNTER — Ambulatory Visit: Payer: Self-pay | Admitting: Ophthalmology

## 2017-11-20 ENCOUNTER — Telehealth: Payer: Self-pay | Admitting: Pharmacist

## 2017-11-20 NOTE — Telephone Encounter (Signed)
11/20/2017 1:57:56 PM - Lantus refill DOSE CHANGE  11/20/17 Received pharmacy printout for dose change-Inject 30 units under the skin every day at 10:00pm. Start with 20 units and increase by 5 units every 3-7 days as long as blood sugars are over 150. (Max daily dose 30 units per [facility]) #2. Printed [employer] refill request and will send to [facility].Delos Haring

## 2017-12-16 ENCOUNTER — Telehealth: Payer: Self-pay | Admitting: Pharmacist

## 2017-12-16 NOTE — Telephone Encounter (Signed)
12/16/2017 2:21:29 PM - Lantus Solostar Dose Change  12/16/17 Faxed [employer] Lantus Solostar Dose Change Max daily dose 30 units daily at 10PM (2). Start with 20 units and increase by 5 units every 3-7 days if sugars over 150.Angelica [name]

## 2017-12-24 ENCOUNTER — Telehealth: Payer: Self-pay | Admitting: Pharmacist

## 2017-12-24 NOTE — Telephone Encounter (Signed)
12/24/2017 2:08:44 PM - Ozempic refill to provider  12/24/17 Sending refill request to [facility] for Ozempic Injec 0.5mg  once a week.Delos Haring

## 2018-01-02 ENCOUNTER — Telehealth: Payer: Self-pay | Admitting: Pharmacist

## 2018-01-02 NOTE — Telephone Encounter (Signed)
01/02/2018 9:17:41 AM - Ozempic refill  01/02/18 Faxed Novo Nordisk refill request for Ozempic 0.25mg  or 0.5mg  Inject 0.5mg  once a week, #5 boxes.Delos Haring

## 2018-01-06 ENCOUNTER — Ambulatory Visit: Payer: Self-pay

## 2018-01-07 ENCOUNTER — Encounter (INDEPENDENT_AMBULATORY_CARE_PROVIDER_SITE_OTHER): Payer: Self-pay

## 2018-01-07 ENCOUNTER — Ambulatory Visit: Payer: Self-pay | Attending: Oncology | Admitting: *Deleted

## 2018-01-07 DIAGNOSIS — Z0000 Encounter for general adult medical examination without abnormal findings: Secondary | ICD-10-CM

## 2018-01-07 NOTE — Progress Notes (Signed)
An attempt was made yesterday to call the patient to reschedule. Patient arrived today for her BCCCP screening.  Last mammogram was 04/21/17 and was a birads 1.  Last pap on 08/16/13 and was negative / negative.  Next pap due in 2020.  Since patient is not due until at least April 2020, we have rescheduled her appointment for her BCCCP screening, mammogram and pap smear on 05/06/2018 @ 10:30.  A gift bag and $20 gas voucher was given to the patient for her inconvenience today.

## 2018-01-19 ENCOUNTER — Telehealth: Payer: Self-pay | Admitting: Pharmacist

## 2018-01-19 NOTE — Telephone Encounter (Signed)
01/19/2018 2:14:59 PM - Lantus Solostar refill  01/19/18 Printed [employer] refill request for [employer] Max daily dose 30 units at bedtime #2; start with 20 units and increase by 5 units every 5-7 days if sugars over 150. Sending to [facility] for provider to sign.Delos Haring

## 2018-01-30 ENCOUNTER — Telehealth: Payer: Self-pay | Admitting: Pharmacist

## 2018-01-30 NOTE — Telephone Encounter (Signed)
01/30/2018 12:17:17 PM - Lantus Solostar refill to [facility]  01/30/2018 Faxed [employer] refill request for [employer] Max daily dose 30 units at bedtime #2; Start with 20 units and increase by 5 units every 3-7 days if sugars over 150.Delos Haring

## 2018-02-05 ENCOUNTER — Ambulatory Visit: Payer: Self-pay | Admitting: Ophthalmology

## 2018-02-10 ENCOUNTER — Ambulatory Visit: Payer: Self-pay | Admitting: Internal Medicine

## 2018-02-10 VITALS — BP 123/80 | HR 83 | Temp 97.6°F | Ht 63.0 in | Wt 200.4 lb

## 2018-02-10 DIAGNOSIS — Z794 Long term (current) use of insulin: Principal | ICD-10-CM

## 2018-02-10 DIAGNOSIS — E1165 Type 2 diabetes mellitus with hyperglycemia: Secondary | ICD-10-CM

## 2018-02-10 LAB — GLUCOSE, POCT (MANUAL RESULT ENTRY): POC GLUCOSE: 160 mg/dL — AB (ref 70–99)

## 2018-02-10 LAB — POCT GLYCOSYLATED HEMOGLOBIN (HGB A1C): Hemoglobin A1C: 9.2 % — AB (ref 4.0–5.6)

## 2018-02-10 NOTE — Progress Notes (Addendum)
Follow up Diabetes/ [facility]     Patient ID: Angelica [name], female   DOB: December 22, 1960, 58 y.o.   MRN: [id number] Assessment:  Angelica [name] is a 58 y.o. female who is seen in follow up for Type 2 diabetes mellitus with hyperglycemia, with long-term current use of insulin ([place]) [E11.65, Z79.4] at the request of Patient, No Pcp Per.  Encounter Diagnoses 1. Type 2 diabetes mellitus with hyperglycemia, with long-term current use of insulin (HCC)     Assessment    Plan:     Pt instructed to visit Medication Management and use pill box to take 1500mg  metformin every day. Pt instructed to increase Lantus by 2u every 3 days until morning blood sugar below 150 every day.  Orders Placed This Encounter  Procedures  . POCT Glucose (CBG)  . POCT HgB A1C     Subjective:  HPI  Pt is presenting for follow-up care with T2DM. Currently taking 28u Lantus every evening and 1000-1500mg  metformin with meals during the day. Thinks she misses 1-2 doses a week. Would like a pill box to help remember medications. Pt denies any hypoglycemia.  A1C 8.8 (10/09/17) at last visit, 9.2 today (02/10/18).   Blood sugar: 150-170 in the morning before breakfast, checks everyday Diet: cooks for herself and mother; breakfast is eggs, lunch is variable, dinner is home-cooked chicken, salads, sandwhichs, etc. Appears to have generally healthy diet. Exercise: busy and moving all day, doesn't vigorously active but doesn't feel the need. Social: Mother is 53 and lives with pt, sister is 68 and has Alzheimers - pt is caretaker for both and is busy during the day driving them to appointments, visiting family, etc.  Review of Systems Endorses slight stable polyuria Denies numbness/tingling in extremities Denies visual changes  Angelica [name]  has a past medical history of Bell palsy (08/04/2014), GERD (gastroesophageal reflux disease) (01/27/2015), History of Bell's palsy (June 2016), Hyperlipidemia  (01/27/2015), Hypertension (01/27/2015), and Type 2 diabetes mellitus ([place]) (08/04/2014).   Current Outpatient Medications on File Prior to Visit  Medication Sig Dispense Refill  . aspirin EC 81 MG tablet Take 1 tablet (81 mg total) by mouth daily. 90 tablet 3  . atorvastatin (LIPITOR) 40 MG tablet Take 1 tablet (40 mg total) by mouth daily at 6 PM. 90 tablet 3  . hydrochlorothiazide (HYDRODIURIL) 12.5 MG tablet Take 1 tablet (12.5 mg total) by mouth 2 (two) times daily. 90 tablet 3  . Insulin Glargine (LANTUS SOLOSTAR) 100 UNIT/ML Solostar Pen Inject 30 Units into the skin daily at 10 pm. Start with 20 and increase by 5 every 3-7 days as long as sugars are over 150. (Patient taking differently: Inject 28 Units into the skin daily at 10 pm. Takes in evening (6-7 pm)) 30 mL 3  . omeprazole (PRILOSEC) 20 MG capsule Take 1 capsule (20 mg total) by mouth daily. 90 capsule 3  . Semaglutide (OZEMPIC) 0.25 or 0.5 MG/DOSE SOPN Inject 0.5 mg into the skin once a week.    . metFORMIN (GLUCOPHAGE-XR) 500 MG 24 hr tablet Take 2 tablets (1,000 mg total) by mouth daily with breakfast AND 1 tablet (500 mg total) daily before supper. (Patient taking differently: Take 500 mg by mouth daily with breakfast AND before supper.) 270 tablet 3  . sulfamethoxazole-trimethoprim (BACTRIM DS,SEPTRA DS) 800-160 MG tablet Take 1 tablet by mouth 2 (two) times daily. (Patient not taking: Reported on 02/10/2018) 14 tablet 0   No current facility-administered medications on file  prior to visit.      Family History, Social History, current Medications and allergies reviewed and updated in Epic.  Objective:    Blood pressure 123/80, pulse 83, temperature 97.6 F (36.4 C), temperature source Oral, height 5\' 3"  (1.6 m), weight 200 lb 6.4 oz (90.9 kg).  Physical Exam Cardio - RRR, no MRG Pulm - CTAB  Data : I have personally reviewed pertinent labs and imaging studies, if indicated,  with the patient in clinic today.    Lab  Orders     POCT Glucose (CBG)     POCT HgB A1C  HC Readings from Last 3 Encounters:  No data found for [facility]    Wt Readings from Last 3 Encounters:  02/10/18 200 lb 6.4 oz (90.9 kg)  10/22/17 197 lb 8 oz (89.6 kg)  10/07/17 196 lb 12.8 oz (89.3 kg)     Attestation: I interviewed, examined and discussed the patient with the student at the time of the visit.  I agree with the findings and plan documented.   Gayland Curry, MD

## 2018-02-18 ENCOUNTER — Other Ambulatory Visit: Payer: Self-pay

## 2018-02-18 DIAGNOSIS — I10 Essential (primary) hypertension: Secondary | ICD-10-CM

## 2018-02-18 DIAGNOSIS — E1165 Type 2 diabetes mellitus with hyperglycemia: Secondary | ICD-10-CM

## 2018-02-18 DIAGNOSIS — Z794 Long term (current) use of insulin: Secondary | ICD-10-CM

## 2018-02-19 LAB — COMPREHENSIVE METABOLIC PANEL
ALBUMIN: 4.4 g/dL (ref 3.8–4.9)
ALK PHOS: 161 IU/L — AB (ref 39–117)
ALT: 19 IU/L (ref 0–32)
AST: 12 IU/L (ref 0–40)
Albumin/Globulin Ratio: 1.2 (ref 1.2–2.2)
BUN / CREAT RATIO: 15 (ref 9–23)
BUN: 11 mg/dL (ref 6–24)
Bilirubin Total: 0.2 mg/dL (ref 0.0–1.2)
CALCIUM: 9.7 mg/dL (ref 8.7–10.2)
CO2: 25 mmol/L (ref 20–29)
CREATININE: 0.72 mg/dL (ref 0.57–1.00)
Chloride: 102 mmol/L (ref 96–106)
GFR calc Af Amer: 109 mL/min/{1.73_m2} (ref >59)
GFR, EST NON AFRICAN AMERICAN: 95 mL/min/{1.73_m2} (ref >59)
GLOBULIN, TOTAL: 3.6 g/dL (ref 1.5–4.5)
GLUCOSE: 171 mg/dL — AB (ref 65–99)
Potassium: 4.2 mmol/L (ref 3.5–5.2)
SODIUM: 141 mmol/L (ref 134–144)
Total Protein: 8 g/dL (ref 6.0–8.5)

## 2018-02-19 LAB — URINALYSIS
BILIRUBIN UA: NEGATIVE
GLUCOSE, UA: NEGATIVE
Ketones, UA: NEGATIVE
Leukocytes, UA: NEGATIVE
NITRITE UA: NEGATIVE
Protein, UA: NEGATIVE
RBC, UA: NEGATIVE
SPEC GRAV UA: 1.027 (ref 1.005–1.030)
UUROB: 0.2 mg/dL (ref 0.2–1.0)
pH, UA: 5 (ref 5.0–7.5)

## 2018-02-19 LAB — CBC WITH DIFFERENTIAL
BASOS ABS: 0 10*3/uL (ref 0.0–0.2)
Basos: 1 %
EOS (ABSOLUTE): 0.1 10*3/uL (ref 0.0–0.4)
Eos: 1 %
Hematocrit: 38 % (ref 34.0–46.6)
Hemoglobin: 12.5 g/dL (ref 11.1–15.9)
Immature Grans (Abs): 0 10*3/uL (ref 0.0–0.1)
Immature Granulocytes: 0 %
Lymphocytes Absolute: 3.2 10*3/uL — ABNORMAL HIGH (ref 0.7–3.1)
Lymphs: 37 %
MCH: 25.9 pg — ABNORMAL LOW (ref 26.6–33.0)
MCHC: 32.9 g/dL (ref 31.5–35.7)
MCV: 79 fL (ref 79–97)
Monocytes Absolute: 0.6 10*3/uL (ref 0.1–0.9)
Monocytes: 7 %
Neutrophils Absolute: 4.7 10*3/uL (ref 1.4–7.0)
Neutrophils: 54 %
RBC: 4.83 x10E6/uL (ref 3.77–5.28)
RDW: 13.1 % (ref 11.7–15.4)
WBC: 8.6 10*3/uL (ref 3.4–10.8)

## 2018-02-19 LAB — HEMOGLOBIN A1C
ESTIMATED AVERAGE GLUCOSE: 212 mg/dL
Hgb A1c MFr Bld: 9 % — ABNORMAL HIGH (ref 4.8–5.6)

## 2018-02-25 ENCOUNTER — Ambulatory Visit: Payer: Self-pay | Admitting: Internal Medicine

## 2018-03-11 ENCOUNTER — Encounter: Payer: Self-pay | Admitting: Internal Medicine

## 2018-03-11 ENCOUNTER — Ambulatory Visit: Payer: Self-pay | Admitting: Internal Medicine

## 2018-03-11 VITALS — BP 124/81 | HR 89 | Temp 97.5°F | Ht 63.0 in | Wt 196.8 lb

## 2018-03-11 DIAGNOSIS — E1165 Type 2 diabetes mellitus with hyperglycemia: Secondary | ICD-10-CM

## 2018-03-11 DIAGNOSIS — Z794 Long term (current) use of insulin: Secondary | ICD-10-CM

## 2018-03-11 DIAGNOSIS — R899 Unspecified abnormal finding in specimens from other organs, systems and tissues: Secondary | ICD-10-CM

## 2018-03-11 DIAGNOSIS — M25561 Pain in right knee: Secondary | ICD-10-CM

## 2018-03-11 DIAGNOSIS — I10 Essential (primary) hypertension: Secondary | ICD-10-CM

## 2018-03-11 MED ORDER — NAPROXEN 250 MG PO TABS: 250.0000 mg | ORAL_TABLET | Freq: Two times a day (BID) | ORAL | 0 refills | Status: DC

## 2018-03-11 NOTE — Progress Notes (Signed)
   Subjective:    Patient ID: Angelica [name], female    DOB: 26-Jul-1960, 58 y.o.   MRN: [id number]  HPI  Patient is a 58 year old female who presents for a follow up appointment. Patient's main concern today is R knee pain and swelling. Patient explains that pain just started one morning and that it hurts most with movement.   Chief Complaint  Patient presents with  . Follow-up    new R knee pain       Review of Systems  Patient Active Problem List   Diagnosis Date Noted  . Menopausal hot flushes 07/20/2015  . GERD (gastroesophageal reflux disease) 01/27/2015  . Hypertension 01/27/2015  . Hyperlipidemia 01/27/2015  . Bell palsy 08/04/2014  . Type 2 diabetes mellitus ([place]) 08/04/2014   Allergies as of 03/11/2018   No Known Allergies     Medication List       Accurate as of March 11, 2018 10:10 AM. Always use your most recent med list.        aspirin EC 81 MG tablet Take 1 tablet (81 mg total) by mouth daily.   atorvastatin 40 MG tablet Commonly known as:  LIPITOR Take 1 tablet (40 mg total) by mouth daily at 6 PM.   hydrochlorothiazide 12.5 MG tablet Commonly known as:  HYDRODIURIL Take 1 tablet (12.5 mg total) by mouth 2 (two) times daily.   Insulin Glargine 100 UNIT/ML Solostar Pen Commonly known as:  LANTUS SOLOSTAR Inject 30 Units into the skin daily at 10 pm. Start with 20 and increase by 5 every 3-7 days as long as sugars are over 150.   metFORMIN 500 MG 24 hr tablet Commonly known as:  GLUCOPHAGE-XR Take 2 tablets (1,000 mg total) by mouth daily with breakfast AND 1 tablet (500 mg total) daily before supper.   omeprazole 20 MG capsule Commonly known as:  PRILOSEC Take 1 capsule (20 mg total) by mouth daily.   OZEMPIC (0.25 OR 0.5 MG/DOSE) 2 MG/1.5ML Sopn Generic drug:  Semaglutide(0.25 or 0.5MG/DOS) Inject 0.5 mg into the skin once a week.   sulfamethoxazole-trimethoprim 800-160 MG tablet Commonly known as:  BACTRIM DS,SEPTRA DS Take 1  tablet by mouth 2 (two) times daily.          Objective:   Physical Exam   BP 124/81   Pulse 89   Temp (!) 97.5 F (36.4 C) (Oral)   Ht 5' 3" (1.6 m)   Wt 196 lb 12.8 oz (89.3 kg)   LMP 03/11/2013 (Within Months)   BMI 34.86 kg/m   Pedial pulses strong.  No detection of significant swelling.     Assessment & Plan:   1. Right knee pain, unspecified chronicity Suspect tendonitis, expecting that it will resolve on it's own.   Order Today:  - naproxen (NAPROSYN) 250 MG tablet; Take 1 tablet (250 mg total) by mouth 2 (two) times daily with a meal.  Dispense: 30 tablet; Refill: 0  Check Today:  - Uric acid  2. Essential hypertension At target.   3. Type 2 diabetes mellitus with hyperglycemia, with long-term current use of insulin [facility]) Patient saw [facility] in 01/2018 and has follow up for 04/2018.  A1c is down from 9.2 to 9.0.   4. Abnormal laboratory test result Alkaline phosphotase appears to be rising. Not explained.   Check Today: - Alkaline Phosphatase, Isoenzymes    RTE in 3 months with labs a week prior A1c and Met C

## 2018-03-12 LAB — SPECIMEN STATUS REPORT

## 2018-03-14 LAB — ALKALINE PHOSPHATASE, ISOENZYMES
Alkaline Phosphatase: 130 IU/L — ABNORMAL HIGH (ref 39–117)
BONE FRACTION: 26 % (ref 14–68)
INTESTINAL FRAC.: 19 % — ABNORMAL HIGH (ref 0–18)
LIVER FRACTION: 55 % (ref 18–85)

## 2018-03-14 LAB — URIC ACID: Uric Acid: 6 mg/dL (ref 2.5–7.1)

## 2018-03-26 ENCOUNTER — Telehealth: Payer: Self-pay | Admitting: Pharmacy Technician

## 2018-03-26 NOTE — Telephone Encounter (Signed)
Received 2020 proof of income.  Patient eligible to receive medication assistance at [facility] as long as eligibility requirements continue to be met.  [facility]

## 2018-04-07 ENCOUNTER — Telehealth: Payer: Self-pay | Admitting: Pharmacist

## 2018-04-07 NOTE — Telephone Encounter (Signed)
04/07/2018 11:19:34 AM - Ozempic Renewal  04/07/2018 Printed [employer] renewal application for [employer] 0.5mg  into the skin once a week #4, sending provider @ [facility] and mailing patient her portion to sign & return.Angelica [name]

## 2018-04-08 ENCOUNTER — Ambulatory Visit: Payer: Self-pay | Admitting: Pharmacist

## 2018-04-08 ENCOUNTER — Other Ambulatory Visit: Payer: Self-pay

## 2018-04-08 DIAGNOSIS — Z79899 Other long term (current) drug therapy: Secondary | ICD-10-CM

## 2018-04-08 NOTE — Progress Notes (Signed)
  [facility] Visit Note  Patient: Angelica [name] MRN: [id number] Date of Birth: 1960/08/14 PCP: Patient, No Pcp Per   [name] 58 y.o. female presents for a Medication Management visit today.  LMP 03/11/2013 (Within Months)   Patient Information   Past Medical History:  Diagnosis Date  . Bell palsy 08/04/2014  . GERD (gastroesophageal reflux disease) 01/27/2015  . History of Bell's palsy June 2016  . Hyperlipidemia 01/27/2015  . Hypertension 01/27/2015  . Type 2 diabetes mellitus ([place]) 08/04/2014      Past Surgical History:  Procedure Laterality Date  . OOPHORECTOMY Right 2005     Family History  Problem Relation Age of Onset  . Cancer Brother        Colon Cancer  . Breast cancer Neg Hx     New Diagnoses (since last visit):   Family Support: Good  Lifestyle Diet: Breakfast:cereal or banana, bacon/eggs Sometimes skips Lunch: snacks or skips Dinner: meat + greens Drinks: juice, water, soda       Exercise: Takes care of mom, grandbaby. sister, so walks a lot    Social History   Substance and Sexual Activity  Alcohol Use No      Social History   Tobacco Use  Smoking Status Never Smoker  Smokeless Tobacco Never Used      Health Maintenance  Topic Date Due  . Hepatitis C Screening  1960/03/28  . FOOT EXAM  12/18/1972  . OPHTHALMOLOGY EXAM  12/18/1972  . HIV Screening  12/18/1977  . TETANUS/TDAP  12/18/1981  . PAP SMEAR-Modifier  12/19/1983  . COLONOSCOPY  12/18/2012  . INFLUENZA VACCINE  08/21/2017  . URINE MICROALBUMIN  12/11/2017  . HEMOGLOBIN A1C  08/19/2018  . MAMMOGRAM  04/22/2019  . PNEUMOCOCCAL POLYSACCHARIDE VACCINE AGE 86-64 HIGH RISK  Completed     Assessment and Plan: 1. Diabetes (metformin, lantus, ozempic): states she checks fasting blood glucose, usually around 130s. Encouraged to check a few post-prandial readings throughout the week and record as well. Discussed limiting carb/sugar intake (45g/ carbs/meal  for women) and eating more protein. No current issues with medications.  2. Hypertension (HCTZ): no issues  3. Hyperlipidemia (Atorvastatin): no issues  Misses about one dose a week; however, medication history shows months without picking up medications. She is getting refills today. Gave locking pill box as she helps take care of grandchildren.   Paticia Stack, PharmD Pharmacy Resident  04/08/2018 10:35 AM

## 2018-04-17 ENCOUNTER — Telehealth: Payer: Self-pay | Admitting: Pharmacist

## 2018-04-17 NOTE — Telephone Encounter (Signed)
04/17/2018 2:47:24 PM - Ozempic pending  04/17/2018 I have received signed paperwork back from patient, holding to receive provider portion back to order Ozempic.Delos Haring

## 2018-04-20 ENCOUNTER — Telehealth: Payer: Self-pay | Admitting: Pharmacist

## 2018-04-20 NOTE — Telephone Encounter (Signed)
04/20/2018 2:25:36 PM - Ozempic  04/20/2018 Faxed Novo Nordisk renewal application for [employer] 0.5mg  into the skin once a week.Delos Haring

## 2018-04-21 ENCOUNTER — Ambulatory Visit: Payer: Self-pay | Admitting: Specialist

## 2018-05-06 ENCOUNTER — Ambulatory Visit: Payer: Self-pay

## 2018-05-12 ENCOUNTER — Ambulatory Visit: Payer: Self-pay

## 2018-06-03 ENCOUNTER — Other Ambulatory Visit: Payer: Self-pay

## 2018-06-03 DIAGNOSIS — Z794 Long term (current) use of insulin: Secondary | ICD-10-CM

## 2018-06-03 DIAGNOSIS — E1165 Type 2 diabetes mellitus with hyperglycemia: Secondary | ICD-10-CM

## 2018-06-04 LAB — COMPREHENSIVE METABOLIC PANEL
ALT: 27 IU/L (ref 0–32)
AST: 20 IU/L (ref 0–40)
Albumin/Globulin Ratio: 1.2 (ref 1.2–2.2)
Albumin: 4.3 g/dL (ref 3.8–4.9)
Alkaline Phosphatase: 127 IU/L — ABNORMAL HIGH (ref 39–117)
BUN/Creatinine Ratio: 14 (ref 9–23)
BUN: 10 mg/dL (ref 6–24)
Bilirubin Total: 0.4 mg/dL (ref 0.0–1.2)
CO2: 25 mmol/L (ref 20–29)
Calcium: 9.9 mg/dL (ref 8.7–10.2)
Chloride: 99 mmol/L (ref 96–106)
Creatinine, Ser: 0.7 mg/dL (ref 0.57–1.00)
GFR calc Af Amer: 113 mL/min/{1.73_m2} (ref >59)
GFR calc non Af Amer: 98 mL/min/{1.73_m2} (ref >59)
Globulin, Total: 3.5 g/dL (ref 1.5–4.5)
Glucose: 176 mg/dL — ABNORMAL HIGH (ref 65–99)
Potassium: 4.2 mmol/L (ref 3.5–5.2)
Sodium: 137 mmol/L (ref 134–144)
Total Protein: 7.8 g/dL (ref 6.0–8.5)

## 2018-06-04 LAB — HEMOGLOBIN A1C
Est. average glucose Bld gHb Est-mCnc: 180 mg/dL
Hgb A1c MFr Bld: 7.9 % — ABNORMAL HIGH (ref 4.8–5.6)

## 2018-06-10 ENCOUNTER — Ambulatory Visit: Payer: Self-pay | Admitting: Internal Medicine

## 2018-06-10 DIAGNOSIS — E785 Hyperlipidemia, unspecified: Secondary | ICD-10-CM

## 2018-06-10 DIAGNOSIS — E119 Type 2 diabetes mellitus without complications: Secondary | ICD-10-CM

## 2018-06-10 MED ORDER — INSULIN GLARGINE 100 UNIT/ML SOLOSTAR PEN: 30.0000 [IU] | PEN_INJECTOR | Freq: Every day | SUBCUTANEOUS | 3 refills | Status: DC

## 2018-06-10 NOTE — Progress Notes (Signed)
   Subjective:    Patient ID: Angelica [name], female    DOB: 1960-06-17, 58 y.o.   MRN: [id number]  HPI  Patient is a 58 year old female who presents for a telephonic visit. Reviewed recent labs with patient. Patient does not have any new concerns or symptoms at this time.    Review of Systems   Patient Active Problem List   Diagnosis Date Noted  . Menopausal hot flushes 07/20/2015  . GERD (gastroesophageal reflux disease) 01/27/2015  . Hypertension 01/27/2015  . Hyperlipidemia 01/27/2015  . Bell palsy 08/04/2014  . Type 2 diabetes mellitus ([place]) 08/04/2014   Allergies as of 06/10/2018   No Known Allergies     Medication List       Accurate as of Jun 10, 2018 10:54 AM. If you have any questions, ask your nurse or doctor.        aspirin EC 81 MG tablet Take 1 tablet (81 mg total) by mouth daily.   atorvastatin 40 MG tablet Commonly known as:  LIPITOR Take 1 tablet (40 mg total) by mouth daily at 6 PM.   hydrochlorothiazide 12.5 MG tablet Commonly known as:  HYDRODIURIL Take 1 tablet (12.5 mg total) by mouth 2 (two) times daily.   Insulin Glargine 100 UNIT/ML Solostar Pen Commonly known as:  Lantus SoloStar Inject 30 Units into the skin daily at 10 pm. Start with 20 and increase by 5 every 3-7 days as long as sugars are over 150. What changed:    how much to take  additional instructions   metFORMIN 500 MG 24 hr tablet Commonly known as:  GLUCOPHAGE-XR Take 2 tablets (1,000 mg total) by mouth daily with breakfast AND 1 tablet (500 mg total) daily before supper. What changed:  See the new instructions.   naproxen 250 MG tablet Commonly known as:  NAPROSYN Take 1 tablet (250 mg total) by mouth 2 (two) times daily with a meal.   omeprazole 20 MG capsule Commonly known as:  PRILOSEC Take 1 capsule (20 mg total) by mouth daily.   Ozempic (0.25 or 0.5 MG/DOSE) 2 MG/1.5ML Sopn Generic drug:  Semaglutide(0.25 or 0.5MG /DOS) Inject 0.5 mg into the skin once a  week.   sulfamethoxazole-trimethoprim 800-160 MG tablet Commonly known as:  BACTRIM DS Take 1 tablet by mouth 2 (two) times daily.         Objective:   Physical Exam   Telephonic Visit     Assessment & Plan:   1. Diabetes mellitus without complication (HCC) T2I improved, went down from 9.0 (01/2018) to 7.9 (05/2018).  Continue medication a prescribed. Patient will follow up with [facility] in June.   Refill Today:  - Insulin Glargine (LANTUS SOLOSTAR) 100 UNIT/ML Solostar Pen; Inject 30 Units into the skin daily at 10 pm. Start with 20 and increase by 5 every 3-7 days as long as sugars are over 150.  Dispense: 30 mL; Refill: 3  Check in 3 Months:  - Hemoglobin A1c; Future - Comprehensive metabolic panel; Future - CBC; Future - TSH; Future  2. Hyperlipidemia, unspecified hyperlipidemia type  Check in 3 Months: - Lipid panel; Future   RTE in 3 Months with labs a week prior

## 2018-07-01 ENCOUNTER — Other Ambulatory Visit: Payer: Self-pay

## 2018-07-01 MED ORDER — BASAGLAR KWIKPEN 100 UNIT/ML ~~LOC~~ SOPN: 30.0000 [IU] | PEN_INJECTOR | Freq: Every day | SUBCUTANEOUS | 2 refills | Status: DC

## 2018-07-07 ENCOUNTER — Ambulatory Visit: Payer: Self-pay

## 2018-07-16 ENCOUNTER — Telehealth: Payer: Self-pay | Admitting: Pharmacist

## 2018-07-16 NOTE — Telephone Encounter (Signed)
07/16/2018 2:09:26 PM - Ozempic refill to provider  07/16/2018 Printed [employer] refill request for [employer] 0.5mg  once a week, #5 and taking to [facility] for provider to sign.Delos Haring

## 2018-07-22 ENCOUNTER — Other Ambulatory Visit: Payer: Self-pay

## 2018-07-22 ENCOUNTER — Other Ambulatory Visit: Payer: Self-pay | Admitting: Gerontology

## 2018-07-22 DIAGNOSIS — N390 Urinary tract infection, site not specified: Secondary | ICD-10-CM

## 2018-07-22 DIAGNOSIS — E119 Type 2 diabetes mellitus without complications: Secondary | ICD-10-CM

## 2018-07-22 MED ORDER — NITROFURANTOIN MONOHYD MACRO 100 MG PO CAPS: 100.0000 mg | ORAL_CAPSULE | Freq: Two times a day (BID) | ORAL | 0 refills | Status: DC

## 2018-07-22 NOTE — Progress Notes (Unsigned)
ua

## 2018-07-23 ENCOUNTER — Telehealth: Payer: Self-pay | Admitting: Pharmacist

## 2018-07-23 ENCOUNTER — Other Ambulatory Visit: Payer: Self-pay

## 2018-07-23 LAB — URINALYSIS
Bilirubin, UA: NEGATIVE
Leukocytes,UA: NEGATIVE
Nitrite, UA: NEGATIVE
Specific Gravity, UA: >=1.03 — AB (ref 1.005–1.030)
Urobilinogen, Ur: 0.2 mg/dL (ref 0.2–1.0)
pH, UA: 5 (ref 5.0–7.5)

## 2018-07-23 NOTE — Telephone Encounter (Signed)
07/23/2018 3:14:50 PM - Ozempic refill  07/23/2018 Faxed [employer] refill request for [employer] 0.5mg  weekly, #5.Angelica [name]

## 2018-08-11 ENCOUNTER — Other Ambulatory Visit: Payer: Self-pay

## 2018-08-11 ENCOUNTER — Ambulatory Visit: Payer: Self-pay

## 2018-08-11 NOTE — Progress Notes (Deleted)
**Note Angelica via Obfuscation** [name] up Diabetes/ [facility]     Patient ID: Angelica [name], female   DOB: [birth year], 58 y.o.   MRN: [id number] Assessment:  Angelica [name] is a 58 y.o. female who is seen in [name] up for T2DM.   Plan:     1.     Subjective:  HPI Pt presents for [name]-up care for T2DM. Currently taking Lantus, metformin, and Ozempic.  A1C 9.2 (02/10/18), decreased to 7.9 (06/03/18).   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  Family History, Social History, current Medications and allergies reviewed and updated in Epic.  Objective:    Current Outpatient Medications on File Prior to Visit  Medication Sig Dispense Refill  . aspirin EC 81 MG tablet Take 1 tablet (81 mg total) by mouth daily. 90 tablet 3  . atorvastatin (LIPITOR) 40 MG tablet Take 1 tablet (40 mg total) by mouth daily at 6 PM. 90 tablet 3  . hydrochlorothiazide (HYDRODIURIL) 12.5 MG tablet Take 1 tablet (12.5 mg total) by mouth 2 (two) times daily. 90 tablet 3  . Insulin Glargine (BASAGLAR KWIKPEN) 100 UNIT/ML SOPN Inject 0.3 mLs (30 Units total) into the skin daily for 30 days. Inject 30 units under the skin every day at 10pm. Start with 20 units and increase by 5 units every 3 to 7 days as long as sugars are over 150. Use until Lantus is available. 9 mL 2  . Insulin Glargine (LANTUS SOLOSTAR) 100 UNIT/ML Solostar Pen Inject 30 Units into the skin daily at 10 pm. Start with 20 and increase by 5 every 3-7 days as long as sugars are over 150. 30 mL 3  . metFORMIN (GLUCOPHAGE-XR) 500 MG 24 hr tablet Take 2  tablets (1,000 mg total) by mouth daily with breakfast AND 1 tablet (500 mg total) daily before supper. (Patient taking differently: Take 500 mg by mouth daily with breakfast AND before supper.) 270 tablet 3  . naproxen (NAPROSYN) 250 MG tablet Take 1 tablet (250 mg total) by mouth 2 (two) times daily with a meal. 30 tablet 0  . omeprazole (PRILOSEC) 20 MG capsule Take 1 capsule (20 mg total) by mouth daily. 90 capsule 3  . Semaglutide (OZEMPIC) 0.25 or 0.5 MG/DOSE SOPN Inject 0.5 mg into the skin once a week.     No current facility-administered medications on file prior to visit.      Last menstrual period 03/11/2013. Physical Exam      Data : I have personally reviewed pertinent labs and imaging studies, if indicated,  with the patient in clinic today.   Lab Orders  No laboratory test(s) ordered today    HC Readings from Last 3 Encounters:  No data found for [facility]    Wt Readings from Last 3 Encounters:  03/11/18 196 lb 12.8 oz (89.3 kg)  02/10/18 200 lb 6.4 oz (90.9 kg)  10/22/17 197 lb 8 oz (89.6 kg)

## 2018-08-14 ENCOUNTER — Telehealth: Payer: Self-pay | Admitting: Pharmacist

## 2018-08-14 NOTE — Telephone Encounter (Signed)
08/14/2018 12:36:38 PM - Lantus Solostar renewal to [employer]  08/14/2018 Faxed [employer] renewal application for [employer] Inject 30 units daily at 10pm # 2.Delos Haring

## 2018-09-17 ENCOUNTER — Other Ambulatory Visit: Payer: Self-pay | Admitting: Gerontology

## 2018-09-17 DIAGNOSIS — I10 Essential (primary) hypertension: Secondary | ICD-10-CM

## 2018-09-22 ENCOUNTER — Other Ambulatory Visit: Payer: Self-pay

## 2018-09-22 DIAGNOSIS — E1165 Type 2 diabetes mellitus with hyperglycemia: Secondary | ICD-10-CM

## 2018-09-22 DIAGNOSIS — Z794 Long term (current) use of insulin: Secondary | ICD-10-CM

## 2018-09-23 ENCOUNTER — Encounter: Payer: Self-pay | Admitting: *Deleted

## 2018-09-23 ENCOUNTER — Other Ambulatory Visit: Payer: Self-pay

## 2018-09-23 ENCOUNTER — Ambulatory Visit: Payer: Self-pay | Admitting: Gerontology

## 2018-09-23 ENCOUNTER — Telehealth: Payer: Self-pay

## 2018-09-23 DIAGNOSIS — Z1211 Encounter for screening for malignant neoplasm of colon: Secondary | ICD-10-CM

## 2018-09-23 DIAGNOSIS — E119 Type 2 diabetes mellitus without complications: Secondary | ICD-10-CM

## 2018-09-23 DIAGNOSIS — Z794 Long term (current) use of insulin: Secondary | ICD-10-CM

## 2018-09-23 DIAGNOSIS — R81 Glycosuria: Secondary | ICD-10-CM

## 2018-09-23 DIAGNOSIS — Z0000 Encounter for general adult medical examination without abnormal findings: Secondary | ICD-10-CM

## 2018-09-23 DIAGNOSIS — E1165 Type 2 diabetes mellitus with hyperglycemia: Secondary | ICD-10-CM

## 2018-09-23 MED ORDER — NA SULFATE-K SULFATE-MG SULF 17.5-3.13-1.6 GM/177ML PO SOLN: 1.0000 | Freq: Once | ORAL | 0 refills | Status: AC

## 2018-09-23 MED ORDER — LANTUS SOLOSTAR 100 UNIT/ML ~~LOC~~ SOPN: 32.0000 [IU] | PEN_INJECTOR | Freq: Every day | SUBCUTANEOUS | 3 refills | Status: DC

## 2018-09-23 NOTE — Patient Instructions (Signed)
Carbohydrate Counting for Diabetes Mellitus, Adult  Carbohydrate counting is a method of keeping track of how many carbohydrates you eat. Eating carbohydrates naturally increases the amount of sugar (glucose) in the blood. Counting how many carbohydrates you eat helps keep your blood glucose within normal limits, which helps you manage your diabetes (diabetes mellitus). It is important to know how many carbohydrates you can safely have in each meal. This is different for every person. A diet and nutrition specialist (registered dietitian) can help you make a meal plan and calculate how many carbohydrates you should have at each meal and snack. Carbohydrates are found in the following foods:  Grains, such as breads and cereals.  Dried beans and soy products.  Starchy vegetables, such as potatoes, peas, and corn.  Fruit and fruit juices.  Milk and yogurt.  Sweets and snack foods, such as cake, cookies, candy, chips, and soft drinks. How do I count carbohydrates? There are two ways to count carbohydrates in food. You can use either of the methods or a combination of both. Reading "Nutrition Facts" on packaged food The "Nutrition Facts" list is included on the labels of almost all packaged foods and beverages in the [place] It includes:  The serving size.  Information about nutrients in each serving, including the grams (g) of carbohydrate per serving. To use the "Nutrition Facts":  Decide how many servings you will have.  Multiply the number of servings by the number of carbohydrates per serving.  The resulting number is the total amount of carbohydrates that you will be having. Learning standard serving sizes of other foods When you eat carbohydrate foods that are not packaged or do not include "Nutrition Facts" on the label, you need to measure the servings in order to count the amount of carbohydrates:  Measure the foods that you will eat with a food scale or measuring cup, if needed.   Decide how many standard-size servings you will eat.  Multiply the number of servings by 15. Most carbohydrate-rich foods have about 15 g of carbohydrates per serving. ? For example, if you eat 8 oz (170 g) of strawberries, you will have eaten 2 servings and 30 g of carbohydrates (2 servings x 15 g = 30 g).  For foods that have more than one food mixed, such as soups and casseroles, you must count the carbohydrates in each food that is included. The following list contains standard serving sizes of common carbohydrate-rich foods. Each of these servings has about 15 g of carbohydrates:   hamburger bun or  English muffin.   oz (15 mL) syrup.   oz (14 g) jelly.  1 slice of bread.  1 six-inch tortilla.  3 oz (85 g) cooked rice or pasta.  4 oz (113 g) cooked dried beans.  4 oz (113 g) starchy vegetable, such as peas, corn, or potatoes.  4 oz (113 g) hot cereal.  4 oz (113 g) mashed potatoes or  of a large baked potato.  4 oz (113 g) canned or frozen fruit.  4 oz (120 mL) fruit juice.  4-6 crackers.  6 chicken nuggets.  6 oz (170 g) unsweetened dry cereal.  6 oz (170 g) plain fat-free yogurt or yogurt sweetened with artificial sweeteners.  8 oz (240 mL) milk.  8 oz (170 g) fresh fruit or one small piece of fruit.  24 oz (680 g) popped popcorn. Example of carbohydrate counting Sample meal  3 oz (85 g) chicken breast.  6 oz (170 g)   brown rice.  4 oz (113 g) corn.  8 oz (240 mL) milk.  8 oz (170 g) strawberries with sugar-free whipped topping. Carbohydrate calculation 1. Identify the foods that contain carbohydrates: ? Rice. ? Corn. ? Milk. ? Strawberries. 2. Calculate how many servings you have of each food: ? 2 servings rice. ? 1 serving corn. ? 1 serving milk. ? 1 serving strawberries. 3. Multiply each number of servings by 15 g: ? 2 servings rice x 15 g = 30 g. ? 1 serving corn x 15 g = 15 g. ? 1 serving milk x 15 g = 15 g. ? 1 serving  strawberries x 15 g = 15 g. 4. Add together all of the amounts to find the total grams of carbohydrates eaten: ? 30 g + 15 g + 15 g + 15 g = 75 g of carbohydrates total. Summary  Carbohydrate counting is a method of keeping track of how many carbohydrates you eat.  Eating carbohydrates naturally increases the amount of sugar (glucose) in the blood.  Counting how many carbohydrates you eat helps keep your blood glucose within normal limits, which helps you manage your diabetes.  A diet and nutrition specialist (registered dietitian) can help you make a meal plan and calculate how many carbohydrates you should have at each meal and snack. This information is not intended to replace advice given to you by your health care provider. Make sure you discuss any questions you have with your health care provider. Document Released: 01/07/2005 Document Revised: 08/01/2016 Document Reviewed: 06/21/2015 Elsevier Patient Education  2020 [employer]  

## 2018-09-23 NOTE — Telephone Encounter (Signed)
Gastroenterology Pre-Procedure Review  Request Date: 10/07/18 Requesting Physician: Dr. Vicente Males  PATIENT REVIEW QUESTIONS: The patient responded to the following health history questions as indicated:    1. Are you having any GI issues? no 2. Do you have a personal history of Polyps? no 3. Do you have a family history of Colon Cancer or Polyps? no 4. Diabetes Mellitus? yes (type 2 oral and insulin) 5. Joint replacements in the past 12 months?no 6. Major health problems in the past 3 months?no 7. Any artificial heart valves, MVP, or defibrillator?no    MEDICATIONS & ALLERGIES:    Patient reports the following regarding taking any anticoagulation/antiplatelet therapy:   Plavix, Coumadin, Eliquis, Xarelto, Lovenox, Pradaxa, Brilinta, or Effient? no Aspirin? yes (81 mg daily)  Patient confirms/reports the following medications:  Current Outpatient Medications  Medication Sig Dispense Refill  . aspirin EC 81 MG tablet Take 1 tablet (81 mg total) by mouth daily. 90 tablet 3  . atorvastatin (LIPITOR) 40 MG tablet Take 1 tablet (40 mg total) by mouth daily at 6 PM. 90 tablet 3  . hydrochlorothiazide (HYDRODIURIL) 12.5 MG tablet Take 1 tablet (12.5 mg total) by mouth 2 (two) times daily. 90 tablet 3  . Insulin Glargine (LANTUS SOLOSTAR) 100 UNIT/ML Solostar Pen Inject 32 Units into the skin daily at 10 pm. Increase by 4 units every 3 days if fasting blood glucose is > 180 mg/dl. 30 mL 3  . metFORMIN (GLUCOPHAGE-XR) 500 MG 24 hr tablet Take 2 tablets (1,000 mg total) by mouth daily with breakfast AND 1 tablet (500 mg total) daily before supper. (Patient taking differently: Take 500 mg by mouth daily with breakfast AND before supper.) 270 tablet 3  . omeprazole (PRILOSEC) 20 MG capsule Take 1 capsule (20 mg total) by mouth daily. 90 capsule 3  . Semaglutide (OZEMPIC) 0.25 or 0.5 MG/DOSE SOPN Inject 0.5 mg into the skin once a week.     No current facility-administered medications for this visit.      Patient confirms/reports the following allergies:  No Known Allergies  No orders of the defined types were placed in this encounter.   AUTHORIZATION INFORMATION Primary Insurance: 1D#: Group #:  Secondary Insurance: 1D#: Group #:  SCHEDULE INFORMATION: Date: 10/07/18 Time: Location:[facility]

## 2018-09-23 NOTE — Progress Notes (Signed)
Established Patient Office Visit  Subjective:  Patient ID: Angelica [name], female    DOB: May 26, 1960  Age: 58 y.o. MRN: [id number]  CC:  Chief Complaint  Patient presents with  . Diabetes  Patient consents to telephone visit and 2 patient identifiers was used to identify patient.  HPI [name] on 09/22/2018, complaint dysuria and urinary frequency. Urine dip stick was done and it was negative for leukocytes, and nitrite, but contained 3+ ketones and glucose. Her blood glucose was checked and it was 400 mg/dl. She was provided with glucometer and advised to check fasting blood glucose and 2 hours after meal.   Her HgbA1c done 3 months ago was 7.9%, and she continues to take 28 units of Lantus, 500 mg metformin bid and Semaglutide 0.5 mg weekly. She checked her blood glucose after eating breakfast during visit this morning and it was 475 mg/dl. She states that she wasn't taking care of herself, non complaint with low carb/ non concentrated sweet diet. She performs daily foot check. She continues to experience dysuria, urinary frequency, but denies flank and pelvic pain. She states that she's doing well and offers no further concern.  Past Medical History:  Diagnosis Date  . Bell palsy 08/04/2014  . GERD (gastroesophageal reflux disease) 01/27/2015  . History of Bell's palsy June 2016  . Hyperlipidemia 01/27/2015  . Hypertension 01/27/2015  . Type 2 diabetes mellitus ([place]) 08/04/2014    Past Surgical History:  Procedure Laterality Date  . OOPHORECTOMY Right 2005    Family History  Problem Relation Age of Onset  . Cancer Brother        Colon Cancer  . Breast cancer Neg Hx     Social History   Socioeconomic History  . Marital status: Single    Spouse name: Not on file  . Number of children: 2  . Years of education: Not on file  . Highest education level: 11th grade  Occupational History  . Not on file  Social Needs  . Financial resource strain: Hard  . Food insecurity   Worry: Often true    Inability: Often true  . Transportation needs    Medical: No    Non-medical: No  Tobacco Use  . Smoking status: Never Smoker  . Smokeless tobacco: Never Used  Substance and Sexual Activity  . Alcohol use: No  . Drug use: No  . Sexual activity: Not on file  Lifestyle  . Physical activity    Days per week: 0 days    Minutes per session: 0 min  . Stress: Only a little  Relationships  . Social Herbalist on phone: Patient refused    Gets together: Patient refused    Attends religious service: Patient refused    Active member of club or organization: Patient refused    Attends meetings of clubs or organizations: Patient refused    Relationship status: Patient refused  . Intimate partner violence    Fear of current or ex partner: No    Emotionally abused: No    Physically abused: No    Forced sexual activity: No  Other Topics Concern  . 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 Not employed      On Food stamps    Outpatient Medications Prior to Visit  Medication Sig Dispense Refill  . aspirin EC 81 MG tablet Take 1 tablet (81 mg total) by  mouth daily. 90 tablet 3  . atorvastatin (LIPITOR) 40 MG tablet Take 1 tablet (40 mg total) by mouth daily at 6 PM. 90 tablet 3  . hydrochlorothiazide (HYDRODIURIL) 12.5 MG tablet Take 1 tablet (12.5 mg total) by mouth 2 (two) times daily. 90 tablet 3  . metFORMIN (GLUCOPHAGE-XR) 500 MG 24 hr tablet Take 2 tablets (1,000 mg total) by mouth daily with breakfast AND 1 tablet (500 mg total) daily before supper. (Patient taking differently: Take 500 mg by mouth daily with breakfast AND before supper.) 270 tablet 3  . omeprazole (PRILOSEC) 20 MG capsule Take 1 capsule (20 mg total) by mouth daily. 90 capsule 3  . Semaglutide (OZEMPIC) 0.25 or 0.5 MG/DOSE SOPN Inject 0.5 mg into the skin once a week.    . Insulin Glargine (LANTUS SOLOSTAR) 100 UNIT/ML  Solostar Pen Inject 30 Units into the skin daily at 10 pm. Start with 20 and increase by 5 every 3-7 days as long as sugars are over 150. 30 mL 3  . naproxen (NAPROSYN) 250 MG tablet Take 1 tablet (250 mg total) by mouth 2 (two) times daily with a meal. (Patient not taking: Reported on 09/23/2018) 30 tablet 0  . Insulin Glargine (BASAGLAR KWIKPEN) 100 UNIT/ML SOPN Inject 0.3 mLs (30 Units total) into the skin daily for 30 days. Inject 30 units under the skin every day at 10pm. Start with 20 units and increase by 5 units every 3 to 7 days as long as sugars are over 150. Use until Lantus is available. (Patient not taking: Reported on 09/23/2018) 9 mL 2   No facility-administered medications prior to visit.     No Known Allergies  ROS Review of Systems  Constitutional: Negative.   Eyes: Negative.   Respiratory: Negative.   Cardiovascular: Negative.   Endocrine: Positive for polydipsia, polyphagia and polyuria.  Genitourinary: Positive for dysuria and frequency. Negative for difficulty urinating, flank pain, hematuria, pelvic pain, vaginal discharge and vaginal pain.  Neurological: Negative.       Objective:    Physical Exam No vital sign or PE was done. LMP 03/11/2013 (Within Months)  Wt Readings from Last 3 Encounters:  03/11/18 196 lb 12.8 oz (89.3 kg)  02/10/18 200 lb 6.4 oz (90.9 kg)  10/22/17 197 lb 8 oz (89.6 kg)     Health Maintenance Due  Topic Date Due  . Hepatitis C Screening  1960/12/31  . FOOT EXAM  12/18/1972  . OPHTHALMOLOGY EXAM  12/18/1972  . HIV Screening  12/18/1977  . TETANUS/TDAP  12/18/1981  . PAP SMEAR-Modifier  12/19/1983  . COLONOSCOPY  12/18/2012  . URINE MICROALBUMIN  12/11/2017  . INFLUENZA VACCINE  08/22/2018    There are no preventive care reminders to display for this patient.  Lab Results  Component Value Date   TSH 1.120 09/18/2016   Lab Results  Component Value Date   WBC 8.6 02/18/2018   HGB 12.5 02/18/2018   HCT 38.0 02/18/2018    MCV 79 02/18/2018   PLT 345 10/30/2017   Lab Results  Component Value Date   NA 137 06/03/2018   K 4.2 06/03/2018   CO2 25 06/03/2018   GLUCOSE 176 (H) 06/03/2018   BUN 10 06/03/2018   CREATININE 0.70 06/03/2018   BILITOT 0.4 06/03/2018   ALKPHOS 127 (H) 06/03/2018   AST 20 06/03/2018   ALT 27 06/03/2018   PROT 7.8 06/03/2018   ALBUMIN 4.3 06/03/2018   CALCIUM 9.9 06/03/2018   ANIONGAP 6 (L) 05/30/2013  Lab Results  Component Value Date   CHOL 213 10/30/2017   Lab Results  Component Value Date   HDL 42 10/30/2017   Lab Results  Component Value Date   LDLCALC 143 10/30/2017   Lab Results  Component Value Date   TRIG 142 10/30/2017   Lab Results  Component Value Date   CHOLHDL 4.5 (H) 10/08/2017   Lab Results  Component Value Date   HGBA1C 7.9 (H) 06/03/2018      Assessment & Plan:     1. Glycosuria - She was advised to increase fluid intake and for tighter glycemic control. She was advised to notify clinic for worsening symptoms.  2. Healthcare maintenance - She was encouraged to complete charity care application for  - Ambulatory referral to Gastroenterology - Ambulatory referral to Hematology / Oncology  3. Type 2 diabetes mellitus with hyperglycemia, with long-term current use of insulin ([place]) -She will continue on 32 units of Lantus at bedtime. She was advised to check fasting blood glucose and 2 hours after her heavy meal, document and bring log to next appointment. She was encouraged to continue on low carb/ non concentrated sweet diet and exercise as tolerated. - Insulin Glargine (LANTUS SOLOSTAR) 100 UNIT/ML Solostar Pen; Inject 32 Units into the skin daily at 10 pm. Increase by 4 units every 3 days if fasting blood glucose is > 180 mg/dl.  Dispense: 30 mL; Refill: 3    Follow-up: Return in about 4 weeks (around 10/21/2018), or if symptoms worsen or fail to improve.    Billijo Dilling Jerold Coombe, NP

## 2018-09-30 ENCOUNTER — Other Ambulatory Visit: Payer: Self-pay

## 2018-09-30 DIAGNOSIS — E119 Type 2 diabetes mellitus without complications: Secondary | ICD-10-CM

## 2018-09-30 DIAGNOSIS — I10 Essential (primary) hypertension: Secondary | ICD-10-CM

## 2018-09-30 DIAGNOSIS — E785 Hyperlipidemia, unspecified: Secondary | ICD-10-CM

## 2018-10-01 ENCOUNTER — Telehealth: Payer: Self-pay | Admitting: Pharmacist

## 2018-10-01 LAB — COMPREHENSIVE METABOLIC PANEL
ALT: 24 IU/L (ref 0–32)
AST: 17 IU/L (ref 0–40)
Albumin/Globulin Ratio: 1.2 (ref 1.2–2.2)
Albumin: 4 g/dL (ref 3.8–4.9)
Alkaline Phosphatase: 176 IU/L — ABNORMAL HIGH (ref 39–117)
BUN/Creatinine Ratio: 12 (ref 9–23)
BUN: 9 mg/dL (ref 6–24)
Bilirubin Total: 0.2 mg/dL (ref 0.0–1.2)
CO2: 23 mmol/L (ref 20–29)
Calcium: 9.7 mg/dL (ref 8.7–10.2)
Chloride: 98 mmol/L (ref 96–106)
Creatinine, Ser: 0.77 mg/dL (ref 0.57–1.00)
GFR calc Af Amer: 101 mL/min/{1.73_m2} (ref >59)
GFR calc non Af Amer: 87 mL/min/{1.73_m2} (ref >59)
Globulin, Total: 3.3 g/dL (ref 1.5–4.5)
Glucose: 407 mg/dL — ABNORMAL HIGH (ref 65–99)
Potassium: 4.1 mmol/L (ref 3.5–5.2)
Sodium: 137 mmol/L (ref 134–144)
Total Protein: 7.3 g/dL (ref 6.0–8.5)

## 2018-10-01 LAB — CBC
Hematocrit: 44.6 % (ref 34.0–46.6)
Hemoglobin: 13.9 g/dL (ref 11.1–15.9)
MCH: 25.9 pg — ABNORMAL LOW (ref 26.6–33.0)
MCHC: 31.2 g/dL — ABNORMAL LOW (ref 31.5–35.7)
MCV: 83 fL (ref 79–97)
Platelets: 348 10*3/uL (ref 150–450)
RBC: 5.36 x10E6/uL — ABNORMAL HIGH (ref 3.77–5.28)
RDW: 13.6 % (ref 11.7–15.4)
WBC: 6.2 10*3/uL (ref 3.4–10.8)

## 2018-10-01 LAB — HEMOGLOBIN A1C
Est. average glucose Bld gHb Est-mCnc: 329 mg/dL
Hgb A1c MFr Bld: 13.1 % — ABNORMAL HIGH (ref 4.8–5.6)

## 2018-10-01 LAB — MICROALBUMIN / CREATININE URINE RATIO
Creatinine, Urine: 35.3 mg/dL
Microalb/Creat Ratio: 30 mg/g creat — ABNORMAL HIGH (ref 0–29)
Microalbumin, Urine: 10.5 ug/mL

## 2018-10-01 LAB — LIPID PANEL
Chol/HDL Ratio: 4.5 ratio — ABNORMAL HIGH (ref 0.0–4.4)
Cholesterol, Total: 175 mg/dL (ref 100–199)
HDL: 39 mg/dL — ABNORMAL LOW (ref >39)
LDL Chol Calc (NIH): 112 mg/dL — ABNORMAL HIGH (ref 0–99)
Triglycerides: 132 mg/dL (ref 0–149)
VLDL Cholesterol Cal: 24 mg/dL (ref 5–40)

## 2018-10-01 LAB — TSH: TSH: 1.47 u[IU]/mL (ref 0.450–4.500)

## 2018-10-01 NOTE — Telephone Encounter (Signed)
10/01/2018 8:34:11 AM - Lantus Solostar Dose Increase  10/01/2018 Received pharmacy printout from [facility] 32 units under the skin every day at 10PM, increase by 4 units every 3 days if fasting blood sugar is greater that 180. Max daily dose 36 units. Printed [employer] refill request for Caro to sign.Delos Haring

## 2018-10-02 ENCOUNTER — Other Ambulatory Visit
Admission: RE | Admit: 2018-10-02 | Discharge: 2018-10-02 | Disposition: A | Discharge status: Home / Self Care | Payer: HRSA Program | Source: Ambulatory Visit | Attending: Gastroenterology | Admitting: Gastroenterology

## 2018-10-02 ENCOUNTER — Other Ambulatory Visit: Payer: Self-pay

## 2018-10-02 DIAGNOSIS — Z01812 Encounter for preprocedural laboratory examination: Secondary | ICD-10-CM | POA: Diagnosis present

## 2018-10-02 DIAGNOSIS — Z20828 Contact with and (suspected) exposure to other viral communicable diseases: Secondary | ICD-10-CM | POA: Insufficient documentation

## 2018-10-02 LAB — SARS CORONAVIRUS 2 (TAT 6-24 HRS): SARS Coronavirus 2: NEGATIVE

## 2018-10-06 ENCOUNTER — Other Ambulatory Visit: Payer: Self-pay

## 2018-10-07 ENCOUNTER — Encounter
Admission: RE | Disposition: A | Discharge status: Home / Self Care | Payer: Self-pay | Source: Home / Self Care | Attending: Gastroenterology

## 2018-10-07 ENCOUNTER — Encounter: Payer: Self-pay | Admitting: *Deleted

## 2018-10-07 ENCOUNTER — Ambulatory Visit: Payer: Self-pay | Admitting: Anesthesiology

## 2018-10-07 ENCOUNTER — Ambulatory Visit
Admission: RE | Admit: 2018-10-07 | Discharge: 2018-10-07 | Disposition: A | Discharge status: Home / Self Care | Payer: Self-pay | Attending: Gastroenterology | Admitting: Gastroenterology

## 2018-10-07 ENCOUNTER — Other Ambulatory Visit: Payer: Self-pay

## 2018-10-07 ENCOUNTER — Telehealth: Payer: Self-pay

## 2018-10-07 DIAGNOSIS — Z1211 Encounter for screening for malignant neoplasm of colon: Secondary | ICD-10-CM

## 2018-10-07 DIAGNOSIS — E785 Hyperlipidemia, unspecified: Secondary | ICD-10-CM | POA: Insufficient documentation

## 2018-10-07 DIAGNOSIS — D122 Benign neoplasm of ascending colon: Secondary | ICD-10-CM | POA: Insufficient documentation

## 2018-10-07 DIAGNOSIS — K222 Esophageal obstruction: Secondary | ICD-10-CM

## 2018-10-07 DIAGNOSIS — K635 Polyp of colon: Secondary | ICD-10-CM

## 2018-10-07 DIAGNOSIS — I10 Essential (primary) hypertension: Secondary | ICD-10-CM | POA: Insufficient documentation

## 2018-10-07 DIAGNOSIS — K219 Gastro-esophageal reflux disease without esophagitis: Secondary | ICD-10-CM | POA: Insufficient documentation

## 2018-10-07 DIAGNOSIS — Z1212 Encounter for screening for malignant neoplasm of rectum: Secondary | ICD-10-CM

## 2018-10-07 DIAGNOSIS — E119 Type 2 diabetes mellitus without complications: Secondary | ICD-10-CM | POA: Insufficient documentation

## 2018-10-07 DIAGNOSIS — K644 Residual hemorrhoidal skin tags: Secondary | ICD-10-CM

## 2018-10-07 DIAGNOSIS — K648 Other hemorrhoids: Secondary | ICD-10-CM

## 2018-10-07 HISTORY — PX: COLONOSCOPY WITH PROPOFOL: SHX5780

## 2018-10-07 LAB — GLUCOSE, CAPILLARY
Glucose-Capillary: 307 mg/dL — ABNORMAL HIGH (ref 70–99)
Glucose-Capillary: 253 mg/dL — ABNORMAL HIGH (ref 70–99)

## 2018-10-07 SURGERY — COLONOSCOPY WITH PROPOFOL
Anesthesia: General

## 2018-10-07 MED ORDER — PROPOFOL 10 MG/ML IV BOLUS
INTRAVENOUS | Status: DC | PRN
  Administered 2018-10-07: 80 mg via INTRAVENOUS

## 2018-10-07 MED ORDER — SODIUM CHLORIDE 0.9 % IV SOLN
INTRAVENOUS | Status: DC
  Administered 2018-10-07: 10:00:00 via INTRAVENOUS

## 2018-10-07 MED ORDER — PROPOFOL 500 MG/50ML IV EMUL
INTRAVENOUS | Status: DC | PRN
  Administered 2018-10-07: 150 ug/kg/min via INTRAVENOUS

## 2018-10-07 MED ORDER — LIDOCAINE HCL (CARDIAC) PF 100 MG/5ML IV SOSY
PREFILLED_SYRINGE | INTRAVENOUS | Status: DC | PRN
  Administered 2018-10-07: 40 mg via INTRAVENOUS

## 2018-10-07 MED ORDER — BISACODYL EC 5 MG PO TBEC: DELAYED_RELEASE_TABLET | ORAL | 0 refills | Status: DC

## 2018-10-07 MED ORDER — NA SULFATE-K SULFATE-MG SULF 17.5-3.13-1.6 GM/177ML PO SOLN: 708.0000 mL | Freq: Once | ORAL | 0 refills | Status: AC

## 2018-10-07 MED ORDER — PROPOFOL 500 MG/50ML IV EMUL
INTRAVENOUS | Status: AC
  Filled 2018-10-07: qty 50

## 2018-10-07 MED ORDER — INSULIN ASPART 100 UNIT/ML ~~LOC~~ SOLN
5.0000 [IU] | Freq: Once | SUBCUTANEOUS | Status: AC
  Administered 2018-10-07: 10:00:00 5 [IU] via SUBCUTANEOUS

## 2018-10-07 MED ORDER — INSULIN ASPART 100 UNIT/ML ~~LOC~~ SOLN
SUBCUTANEOUS | Status: AC
  Administered 2018-10-07: 10:00:00 5 [IU] via SUBCUTANEOUS
  Filled 2018-10-07: qty 1

## 2018-10-07 MED ORDER — INSULIN REGULAR HUMAN 100 UNIT/ML IJ SOLN
5.0000 [IU] | Freq: Once | INTRAMUSCULAR | Status: DC
  Filled 2018-10-07: qty 10

## 2018-10-07 NOTE — Anesthesia Procedure Notes (Signed)
Date/Time: 10/07/2018 9:52 AM Performed by: Johnna Acosta, CRNA Pre-anesthesia Checklist: Patient identified, Emergency Drugs available, Suction available, Patient being monitored and Timeout performed Patient Re-evaluated:Patient Re-evaluated prior to induction Oxygen Delivery Method: Nasal cannula Preoxygenation: Pre-oxygenation with 100% oxygen Induction Type: IV induction

## 2018-10-07 NOTE — Anesthesia Preprocedure Evaluation (Signed)
Anesthesia Evaluation  Patient identified by MRN, date of birth, ID band Patient awake    Reviewed: Allergy & Precautions, NPO status , Patient's Chart, lab work & pertinent test results  History of Anesthesia Complications Negative for: history of anesthetic complications  Airway Mallampati: II  TM Distance: >3 FB Neck ROM: Full    Dental no notable dental hx.    Pulmonary neg pulmonary ROS, neg sleep apnea, neg COPD,    breath sounds clear to auscultation- rhonchi (-) wheezing      Cardiovascular hypertension, (-) CAD, (-) Past MI, (-) Cardiac Stents and (-) CABG  Rhythm:Regular Rate:Normal - Systolic murmurs and - Diastolic murmurs    Neuro/Psych neg Seizures negative neurological ROS  negative psych ROS   GI/Hepatic Neg liver ROS, GERD  ,  Endo/Other  diabetes, Insulin Dependent  Renal/GU negative Renal ROS     Musculoskeletal negative musculoskeletal ROS (+)   Abdominal (+) + obese,   Peds  Hematology negative hematology ROS (+)   Anesthesia Other Findings Past Medical History: 08/04/2014: Bell palsy 01/27/2015: GERD (gastroesophageal reflux disease) June 2016: History of Bell's palsy 01/27/2015: Hyperlipidemia 01/27/2015: Hypertension 08/04/2014: Type 2 diabetes mellitus ([facility])   Reproductive/Obstetrics                             Anesthesia Physical Anesthesia Plan  ASA: II  Anesthesia Plan: General   Post-op Pain Management:    Induction: Intravenous  PONV Risk Score and Plan: 2 and Propofol infusion  Airway Management Planned: Natural Airway  Additional Equipment:   Intra-op Plan:   Post-operative Plan:   Informed Consent: I have reviewed the patients History and Physical, chart, labs and discussed the procedure including the risks, benefits and alternatives for the proposed anesthesia with the patient or authorized representative who has indicated his/her understanding  and acceptance.     Dental advisory given  Plan Discussed with: CRNA and Anesthesiologist  Anesthesia Plan Comments:         Anesthesia Quick Evaluation

## 2018-10-07 NOTE — H&P (Signed)
Vonda Antigua, MD [address] [place], [place], [place], [address] [address], [place], [place], [place], [address] Phone: [phone]  Fax: [phone]  Primary Care Physician:  System, Pcp Not In   Pre-Procedure History & Physical: HPI:  Angelica [name] is a 58 y.o. female is here for a colonoscopy.   Past Medical History:  Diagnosis Date  . Bell palsy 08/04/2014  . GERD (gastroesophageal reflux disease) 01/27/2015  . History of Bell's palsy June 2016  . Hyperlipidemia 01/27/2015  . Hypertension 01/27/2015  . Type 2 diabetes mellitus ([place]) 08/04/2014    Past Surgical History:  Procedure Laterality Date  . OOPHORECTOMY Right 2005    Prior to Admission medications   Medication Sig Start Date End Date Taking? Authorizing Provider  aspirin EC 81 MG tablet Take 1 tablet (81 mg total) by mouth daily. 12/25/16  Yes Tawni Millers, MD  atorvastatin (LIPITOR) 40 MG tablet Take 1 tablet (40 mg total) by mouth daily at 6 PM. 12/25/16  Yes Chaplin, Dianah Field, MD  hydrochlorothiazide (HYDRODIURIL) 12.5 MG tablet Take 1 tablet (12.5 mg total) by mouth 2 (two) times daily. 10/07/17 10/07/18 Yes Gwyndolyn Saxon, FNP  Insulin Glargine (LANTUS SOLOSTAR) 100 UNIT/ML Solostar Pen Inject 32 Units into the skin daily at 10 pm. Increase by 4 units every 3 days if fasting blood glucose is > 180 mg/dl. 09/23/18  Yes Iloabachie, Chioma E, NP  omeprazole (PRILOSEC) 20 MG capsule Take 1 capsule (20 mg total) by mouth daily. 10/07/17  Yes Gwyndolyn Saxon, FNP  metFORMIN (GLUCOPHAGE-XR) 500 MG 24 hr tablet Take 2 tablets (1,000 mg total) by mouth daily with breakfast AND 1 tablet (500 mg total) daily before supper. Patient taking differently: Take 500 mg by mouth daily with breakfast AND before supper. 10/07/17 09/23/18  Gwyndolyn Saxon, FNP  Semaglutide (OZEMPIC) 0.25 or 0.5 MG/DOSE SOPN Inject 0.5 mg into the skin once a week.    [provider]    Allergies as of 09/23/2018  . (No  Known Allergies)    Family History  Problem Relation Age of Onset  . Cancer Brother        Colon Cancer  . Breast cancer Neg Hx     Social History   Socioeconomic History  . Marital status: Single    Spouse name: Not on file  . Number of children: 2  . Years of education: Not on file  . Highest education level: 11th grade  Occupational History  . Not on file  Social Needs  . Financial resource strain: Hard  . Food insecurity    Worry: Often true    Inability: Often true  . Transportation needs    Medical: No    Non-medical: No  Tobacco Use  . Smoking status: Never Smoker  . Smokeless tobacco: Never Used  Substance and Sexual Activity  . Alcohol use: No  . Drug use: No  . Sexual activity: Not on file  Lifestyle  . Physical activity    Days per week: 0 days    Minutes per session: 0 min  . Stress: Only a little  Relationships  . Social Herbalist on phone: Patient refused    Gets together: Patient refused    Attends religious service: Patient refused    Active member of club or organization: Patient refused    Attends meetings of clubs or organizations: Patient refused    Relationship status: Patient refused  . Intimate partner  violence    Fear of current or ex partner: No    Emotionally abused: No    Physically abused: No    Forced sexual activity: No  Other Topics Concern  . 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 Not employed      On Food stamps    Review of Systems: See HPI, otherwise negative ROS  Physical Exam: BP 140/90   Pulse 83   Temp 97.9 F (36.6 C) (Tympanic)   Resp 16   Ht 5\' 3"  (1.6 m)   Wt 86.2 kg   LMP 03/11/2013 (Within Months)   SpO2 98%   BMI 33.66 kg/m  General:   Alert,  pleasant and cooperative in NAD Head:  Normocephalic and atraumatic. Neck:  Supple; no masses or thyromegaly. Lungs:  Clear throughout to auscultation, normal respiratory effort.     Heart:  +S1, +S2, Regular rate and rhythm, No edema. Abdomen:  Soft, nontender and nondistended. Normal bowel sounds, without guarding, and without rebound.   Neurologic:  Alert and  oriented x4;  grossly normal neurologically.  Impression/Plan: Angelica [name] is here for a colonoscopy to be performed for average risk screening. A previous GI note reports family history of colon cancer in brother. However, pt denies this. No hematochezia or GI symptoms.   Risks, benefits, limitations, and alternatives regarding  colonoscopy have been reviewed with the patient.  Questions have been answered.  All parties agreeable.   Virgel Manifold, MD  10/07/2018, 9:46 AM

## 2018-10-07 NOTE — Telephone Encounter (Signed)
Called patient and scheduled patient for EGD on October 15 with covid test on 11/02/18. Patient scheduled colonoscopy with 2 day prep on 12/31/2018 and covid test 12/28/2018. Informed patient I would send prep to the pharmacy and mail instructions to patient

## 2018-10-07 NOTE — Op Note (Signed)
[facility] Gastroenterology Patient Name: Angelica [name] Procedure Date: 10/07/2018 9:46 AM MRN: [id number] Account #: [id number] Date of Birth: 1960-07-25 Admit Type: Outpatient Age: 58 Room: [facility] ENDO ROOM 2 Gender: Female Note Status: Finalized Procedure:            Colonoscopy Indications:          Screening for colorectal malignant neoplasm Providers:            Varnita B. Bonna Gains MD, MD Referring MD:         Forest Gleason Md, MD (Referring MD) Medicines:            Monitored Anesthesia Care Complications:        No immediate complications. Procedure:            Pre-Anesthesia Assessment:                       - ASA Grade Assessment: II - A patient with mild                        systemic disease.                       - Prior to the procedure, a History and Physical was                        performed, and patient medications, allergies and                        sensitivities were reviewed. The patient's tolerance of                        previous anesthesia was reviewed.                       - The risks and benefits of the procedure and the                        sedation options and risks were discussed with the                        patient. All questions were answered and informed                        consent was obtained.                       - Patient identification and proposed procedure were                        verified prior to the procedure by the physician, the                        nurse, the anesthesiologist, the anesthetist and the                        technician. The procedure was verified in the procedure                        room.  After obtaining informed consent, the colonoscope was                        passed under direct vision. Throughout the procedure,                        the patient's blood pressure, pulse, and oxygen                        saturations were monitored continuously. The                  Colonoscope was introduced through the anus and                        advanced to the the cecum, identified by appendiceal                        orifice and ileocecal valve. The colonoscopy was                        performed with ease. The patient tolerated the                        procedure well. The quality of the bowel preparation                        was poor. Findings:      The perianal exam findings include non-thrombosed external hemorrhoids.      A 4 mm polyp was found in the ascending colon. The polyp was sessile.       The polyp was removed with a cold biopsy forceps. Resection and       retrieval were complete.      The exam was otherwise without abnormality.      The rectum, sigmoid colon, descending colon, transverse colon, ascending       colon and cecum appeared normal.      Non-bleeding internal hemorrhoids were found during retroflexion. Impression:           - Preparation of the colon was poor.                       - Non-thrombosed external hemorrhoids found on perianal                        exam.                       - One 4 mm polyp in the ascending colon, removed with a                        cold biopsy forceps. Resected and retrieved.                       - The examination was otherwise normal.                       - The rectum, sigmoid colon, descending colon,                        transverse colon, ascending colon and cecum are normal.                       -  Non-bleeding internal hemorrhoids. Recommendation:       - Discharge patient to home (with escort).                       - Advance diet as tolerated.                       - Continue present medications.                       - Await pathology results.                       - Repeat colonoscopy at the next available appointment.                       - The findings and recommendations were discussed with                        the patient.                       - The findings  and recommendations were discussed with                        the patient's family.                       - Return to primary care physician as previously                        scheduled.                       - High fiber diet. Procedure Code(s):    --- Professional ---                       [phone], Colonoscopy, flexible; with biopsy, single or                        multiple Diagnosis Code(s):    --- Professional ---                       Z12.11, Encounter for screening for malignant neoplasm                        of colon                       K64.4, Residual hemorrhoidal skin tags                       K64.8, Other hemorrhoids                       K63.5, Polyp of colon CPT copyright 2019 American Medical Association. All rights reserved. The codes documented in this report are preliminary and upon coder review may  be revised to meet current compliance requirements.  Vonda Antigua, MD Margretta Sidle B. Bonna Gains MD, MD 10/07/2018 10:42:27 AM This report has been signed electronically. Number of Addenda: 0 Note Initiated On: 10/07/2018 9:46 AM Scope Withdrawal Time: 0 hours 9 minutes 9 seconds  Total Procedure Duration: 0 hours 16 minutes 21 seconds  Estimated Blood Loss:  Estimated blood loss: none.      [facility]

## 2018-10-07 NOTE — Telephone Encounter (Signed)
-----   Message from Virgel Manifold, MD sent at 10/07/2018  9:47 AM EDT ----- She needs to be scheduled for repeat EGD in 2-4 weeks for Esophageal stricture. Try to schedule her the week of Oct 12th at [facility]. Any day that week.

## 2018-10-07 NOTE — Anesthesia Postprocedure Evaluation (Signed)
Anesthesia Post Note  Patient: Angelica [name]  Procedure(s) Performed: COLONOSCOPY WITH PROPOFOL (N/A )  Patient location during evaluation: Endoscopy Anesthesia Type: General Level of consciousness: awake and alert and oriented Pain management: pain level controlled Vital Signs Assessment: post-procedure vital signs reviewed and stable Respiratory status: spontaneous breathing, nonlabored ventilation and respiratory function stable Cardiovascular status: blood pressure returned to baseline and stable Postop Assessment: no signs of nausea or vomiting Anesthetic complications: no     Last Vitals:  Vitals:   10/07/18 1040 10/07/18 1050  BP: 116/82 122/88  Pulse: 86 79  Resp: 15 20  Temp:    SpO2: 100% 100%    Last Pain:  Vitals:   10/07/18 1022  TempSrc: Tympanic  PainSc:                  [name]

## 2018-10-07 NOTE — Telephone Encounter (Signed)
Will call patient this afternoon

## 2018-10-07 NOTE — Telephone Encounter (Signed)
-----   Message from Virgel Manifold, MD sent at 10/07/2018 11:03 AM EDT ----- Poor prep on today's exam. Please schedule for colonoscopy within 3 months with 2 day prep. Remind patient no solid foods on days she is taking the prep.

## 2018-10-07 NOTE — Anesthesia Post-op Follow-up Note (Signed)
Anesthesia QCDR form completed.        

## 2018-10-07 NOTE — Transfer of Care (Signed)
Immediate Anesthesia Transfer of Care Note  Patient: Angelica [name]  Procedure(s) Performed: Procedure(s): COLONOSCOPY WITH PROPOFOL (N/A)  Patient Location: PACU and Endoscopy Unit  Anesthesia Type:General  Level of Consciousness: sedated  Airway & Oxygen Therapy: Patient Spontanous Breathing and Patient connected to nasal cannula oxygen  Post-op Assessment: Report given to RN and Post -op Vital signs reviewed and stable  Post vital signs: Reviewed and stable  Last Vitals:  Vitals:   10/07/18 1020 10/07/18 1022  BP: 115/69 115/69  Pulse: 83 (!) 102  Resp: (!) 25 19  Temp: (!) 36.1 C 36.5 C  SpO2: [id number]    Complications: No apparent anesthesia complications

## 2018-10-08 ENCOUNTER — Encounter: Payer: Self-pay | Admitting: Gastroenterology

## 2018-10-08 LAB — SURGICAL PATHOLOGY

## 2018-10-09 ENCOUNTER — Telehealth: Payer: Self-pay

## 2018-10-09 NOTE — Telephone Encounter (Signed)
-----   Message from Virgel Manifold, MD sent at 10/09/2018  9:22 AM EDT ----- Caryl Pina please let the patient know, her polyp was benign but precancerous and was removed. I recommend a repeat colonoscopy within 3 months with 2 day prep. Please schedule.

## 2018-10-09 NOTE — Telephone Encounter (Signed)
Patient verbalized understanding. Patient states right now she does not want to scheduled the colonoscopy in 3 months with 2 day prep. Patient states she will call back. Put in recall

## 2018-10-14 ENCOUNTER — Other Ambulatory Visit: Payer: Self-pay

## 2018-10-20 ENCOUNTER — Other Ambulatory Visit: Payer: Self-pay

## 2018-10-20 ENCOUNTER — Ambulatory Visit: Payer: Self-pay | Admitting: Gerontology

## 2018-10-20 ENCOUNTER — Encounter: Payer: Self-pay | Admitting: Gerontology

## 2018-10-20 VITALS — BP 137/92 | HR 94 | Temp 97.2°F | Ht 63.0 in | Wt 190.0 lb

## 2018-10-20 DIAGNOSIS — R300 Dysuria: Secondary | ICD-10-CM | POA: Insufficient documentation

## 2018-10-20 DIAGNOSIS — E1165 Type 2 diabetes mellitus with hyperglycemia: Secondary | ICD-10-CM

## 2018-10-20 DIAGNOSIS — Z794 Long term (current) use of insulin: Secondary | ICD-10-CM

## 2018-10-20 DIAGNOSIS — R519 Headache, unspecified: Secondary | ICD-10-CM | POA: Insufficient documentation

## 2018-10-20 DIAGNOSIS — F419 Anxiety disorder, unspecified: Secondary | ICD-10-CM

## 2018-10-20 DIAGNOSIS — G4700 Insomnia, unspecified: Secondary | ICD-10-CM

## 2018-10-20 MED ORDER — LANTUS SOLOSTAR 100 UNIT/ML ~~LOC~~ SOPN: 36.0000 [IU] | PEN_INJECTOR | Freq: Every day | SUBCUTANEOUS | 3 refills | Status: DC

## 2018-10-20 NOTE — Progress Notes (Signed)
Established Patient Office Visit  Subjective:  Patient ID: IDMAN VITTITOE, female    DOB: 03/07/1960  Age: 58 y.o. MRN: [id number]  CC:  Chief Complaint  Patient presents with  . Diabetes    HPI Angelica [name] presents for follow up of uncontrolled type 2 diabetes and lab review. Her last HgbA1c done 2 weeks ago was 13.1% ,she admits to checking her blood glucose sometimes once daily instead of 2 times a day. She also states that she has not being checking her fasting blood glucose, but checks it after she eats breakfast. She states that her blood glucose 2 days ago after eating breakfast was 236 mg/dl. She states that she hasn't had breakfast prior to office visit and her blood glucose reading was 236 mg/dl. She denies hypoglycemic symptoms, but states that she experiences dysuria, polyuria, urinary frequency and urgency. She takes 32 units of Lantus, 500 mg of Metformin bid.  She takes 40 mg Atorvastatin and her Lipid panel done 2 weeks ago, LDL was 112 and HDL was 39 mg/dl, she denies myalgia and dark urine. She also reports that she experiences constant dull  frontal headache that radiates to the back of her head daily. She states that the intensity increases from 4-7. She reports that headache starts after administering Ozempic weekly and lasts until the next administeration. She states that headache is relieved with taking 1 tablet of Aleve. She denies vision changes, light headedness and no neurological changes.  She takes care of her mother who has Dementia, and she reports that her mood fluctuates, and she sleeps only 3-4 hours daily. She states that sometimes she wants to be left alone, she denies suicidal or homicidal ideation, and requests to see a mental health counselor. She denies chest pain, fever, chills and no further concerns.    Past Medical History:  Diagnosis Date  . Bell palsy 08/04/2014  . GERD (gastroesophageal reflux disease) 01/27/2015  . History of Bell's palsy  June 2016  . Hyperlipidemia 01/27/2015  . Hypertension 01/27/2015  . Type 2 diabetes mellitus ([place]) 08/04/2014    Past Surgical History:  Procedure Laterality Date  . COLONOSCOPY WITH PROPOFOL N/A 10/07/2018   Procedure: COLONOSCOPY WITH PROPOFOL;  Surgeon: Virgel Manifold, MD;  Location: [facility] ENDOSCOPY;  Service: Gastroenterology;  Laterality: N/A;  . OOPHORECTOMY Right 2005    Family History  Problem Relation Age of Onset  . Cancer Brother        Colon Cancer  . Breast cancer Neg Hx     Social History   Socioeconomic History  . Marital status: Single    Spouse name: Not on file  . Number of children: 2  . Years of education: Not on file  . Highest education level: 11th grade  Occupational History  . Not on file  Social Needs  . Financial resource strain: Hard  . Food insecurity    Worry: Often true    Inability: Often true  . Transportation needs    Medical: No    Non-medical: No  Tobacco Use  . Smoking status: Never Smoker  . Smokeless tobacco: Never Used  Substance and Sexual Activity  . Alcohol use: No  . Drug use: No  . Sexual activity: Not on file  Lifestyle  . Physical activity    Days per week: 0 days    Minutes per session: 0 min  . Stress: Only a little  Relationships  . Social Herbalist on phone:  Patient refused    Gets together: Patient refused    Attends religious service: Patient refused    Active member of club or organization: Patient refused    Attends meetings of clubs or organizations: Patient refused    Relationship status: Patient refused  . Intimate partner violence    Fear of current or ex partner: No    Emotionally abused: No    Physically abused: No    Forced sexual activity: No  Other Topics Concern  . 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 Not employed      On Food stamps    Outpatient Medications Prior to Visit  Medication Sig Dispense Refill   . aspirin EC 81 MG tablet Take 1 tablet (81 mg total) by mouth daily. 90 tablet 3  . atorvastatin (LIPITOR) 40 MG tablet Take 1 tablet (40 mg total) by mouth daily at 6 PM. 90 tablet 3  . bisacodyl (BISACODYL) 5 MG EC tablet Please  Take 2 tablets (10mg ) the day before your procedure between 1pm and 3pm 2 tablet 0  . hydrochlorothiazide (HYDRODIURIL) 12.5 MG tablet Take 1 tablet (12.5 mg total) by mouth 2 (two) times daily. 90 tablet 3  . metFORMIN (GLUCOPHAGE-XR) 500 MG 24 hr tablet Take 2 tablets (1,000 mg total) by mouth daily with breakfast AND 1 tablet (500 mg total) daily before supper. (Patient taking differently: Take 500 mg by mouth daily with breakfast AND before supper.) 270 tablet 3  . omeprazole (PRILOSEC) 20 MG capsule Take 1 capsule (20 mg total) by mouth daily. 90 capsule 3  . Semaglutide (OZEMPIC) 0.25 or 0.5 MG/DOSE SOPN Inject 0.5 mg into the skin once a week.    . Insulin Glargine (LANTUS SOLOSTAR) 100 UNIT/ML Solostar Pen Inject 32 Units into the skin daily at 10 pm. Increase by 4 units [name] 3 days if fasting blood glucose is > 180 mg/dl. 30 mL 3   No facility-administered medications prior to visit.     No Known Allergies  ROS Review of Systems  Constitutional: Negative.  Negative for chills and fever.  Respiratory: Negative.   Cardiovascular: Negative.   Endocrine: Positive for polyuria. Negative for polydipsia and polyphagia.  Genitourinary: Positive for dysuria, frequency and urgency. Negative for flank pain.  Skin: Negative.   Neurological: Negative.   Psychiatric/Behavioral: Positive for sleep disturbance. The patient is nervous/anxious.       Objective:    Physical Exam  Constitutional: She is oriented to person, place, and time. She appears well-developed and well-nourished.  HENT:  Head: Normocephalic.  Eyes: Pupils are equal, round, and reactive to light.  Cardiovascular: Normal rate.  Pulmonary/Chest: Effort normal and breath sounds normal.   Abdominal: Soft. Bowel sounds are normal.  Neurological: She is alert and oriented to person, place, and time.  Skin: Skin is warm and dry.  Psychiatric: She has a normal mood and affect. Her behavior is normal. Judgment and thought content normal.    BP (!) 137/92 (BP Location: Left Arm, Patient Position: Sitting)   Pulse 94   Temp (!) 97.2 F (36.2 C)   Ht 5\' 3"  (1.6 m)   Wt 190 lb (86.2 kg)   LMP 03/11/2013 (Within Months)   SpO2 97%   BMI 33.66 kg/m  Wt Readings from Last 3 Encounters:  10/20/18 190 lb (86.2 kg)  10/07/18 190 lb (86.2 kg)  03/11/18 196 lb 12.8 oz (89.3 kg)  She was encouraged to continue on weight loss regimen.  Health Maintenance Due  Topic Date Due  . TETANUS/TDAP  12/18/1981  . PAP SMEAR-Modifier  12/19/1983  . OPHTHALMOLOGY EXAM  12/27/2011    There are no preventive care reminders to display for this patient.  Lab Results  Component Value Date   TSH 1.470 09/30/2018   Lab Results  Component Value Date   WBC 6.2 09/30/2018   HGB 13.9 09/30/2018   HCT 44.6 09/30/2018   MCV 83 09/30/2018   PLT 348 09/30/2018   Lab Results  Component Value Date   NA 137 09/30/2018   K 4.1 09/30/2018   CO2 23 09/30/2018   GLUCOSE 407 (H) 09/30/2018   BUN 9 09/30/2018   CREATININE 0.77 09/30/2018   BILITOT 0.2 09/30/2018   ALKPHOS 176 (H) 09/30/2018   AST 17 09/30/2018   ALT 24 09/30/2018   PROT 7.3 09/30/2018   ALBUMIN 4.0 09/30/2018   CALCIUM 9.7 09/30/2018   ANIONGAP 6 (L) 05/30/2013   Lab Results  Component Value Date   CHOL 175 09/30/2018   Lab Results  Component Value Date   HDL 39 (L) 09/30/2018   Lab Results  Component Value Date   LDLCALC 112 (H) 09/30/2018   Lab Results  Component Value Date   TRIG 132 09/30/2018   Lab Results  Component Value Date   CHOLHDL 4.5 (H) 09/30/2018   Lab Results  Component Value Date   HGBA1C 13.1 (H) 09/30/2018      Assessment & Plan:    1. Type 2 diabetes mellitus with  hyperglycemia, with long-term current use of insulin ([place]) - Uncontrolled type 2 diabetes due to non compliance. She was encouraged to check and record her fasting blood glucose daily and bring log to clinic. - She was also encouraged to continue on low carb/non concentrated sweet diet, exercise daily as tolerated. - Insulin Glargine (LANTUS SOLOSTAR) 100 UNIT/ML Solostar Pen; Inject 36 Units into the skin daily at 10 pm. Increase by 4 units [name] 3 days if fasting blood glucose is > 180 mg/dl.  Dispense: 30 mL; Refill: 3  2. Nonintractable headache, unspecified chronicity pattern, unspecified headache type - Headache might be due to Insomnia. She was advised to take Tylenol 650 mg [name] 8-12 hours and to notify the clinic with worsening headache.  3. Insomnia, unspecified type - Insomnia due to not getting enough sleep by providing care to her mother with Dementia who wanders at night. She was advised to maintain sleep  hygiene. She will follow up with Angelica [name].  4. Anxiety - She will follow up with Angelica. Jene [name], and was advised to notify clinic or go to the ED for worsening anxiety.  5. Dysuria - Urinalysis and culture will be checked to rule out infection or rule in glycosuria. - Urinalysis; Future - UA/M w/rflx Culture, Routine; Future    Follow-up: Return in about 1 day (around 10/21/2018), or if symptoms worsen or fail to improve.    Hyun Marsalis Jerold Coombe, NP

## 2018-10-20 NOTE — Patient Instructions (Signed)
Carbohydrate Counting for Diabetes Mellitus, Adult  Carbohydrate counting is a method of keeping track of how many carbohydrates you eat. Eating carbohydrates naturally increases the amount of sugar (glucose) in the blood. Counting how many carbohydrates you eat helps keep your blood glucose within normal limits, which helps you manage your diabetes (diabetes mellitus). It is important to know how many carbohydrates you can safely have in each meal. This is different for every person. A diet and nutrition specialist (registered dietitian) can help you make a meal plan and calculate how many carbohydrates you should have at each meal and snack. Carbohydrates are found in the following foods:  Grains, such as breads and cereals.  Dried beans and soy products.  Starchy vegetables, such as potatoes, peas, and corn.  Fruit and fruit juices.  Milk and yogurt.  Sweets and snack foods, such as cake, cookies, candy, chips, and soft drinks. How do I count carbohydrates? There are two ways to count carbohydrates in food. You can use either of the methods or a combination of both. Reading "Nutrition Facts" on packaged food The "Nutrition Facts" list is included on the labels of almost all packaged foods and beverages in the [place] It includes:  The serving size.  Information about nutrients in each serving, including the grams (g) of carbohydrate per serving. To use the "Nutrition Facts":  Decide how many servings you will have.  Multiply the number of servings by the number of carbohydrates per serving.  The resulting number is the total amount of carbohydrates that you will be having. Learning standard serving sizes of other foods When you eat carbohydrate foods that are not packaged or do not include "Nutrition Facts" on the label, you need to measure the servings in order to count the amount of carbohydrates:  Measure the foods that you will eat with a food scale or measuring cup, if needed.   Decide how many standard-size servings you will eat.  Multiply the number of servings by 15. Most carbohydrate-rich foods have about 15 g of carbohydrates per serving. ? For example, if you eat 8 oz (170 g) of strawberries, you will have eaten 2 servings and 30 g of carbohydrates (2 servings x 15 g = 30 g).  For foods that have more than one food mixed, such as soups and casseroles, you must count the carbohydrates in each food that is included. The following list contains standard serving sizes of common carbohydrate-rich foods. Each of these servings has about 15 g of carbohydrates:   hamburger bun or  English muffin.   oz (15 mL) syrup.   oz (14 g) jelly.  1 slice of bread.  1 six-inch tortilla.  3 oz (85 g) cooked rice or pasta.  4 oz (113 g) cooked dried beans.  4 oz (113 g) starchy vegetable, such as peas, corn, or potatoes.  4 oz (113 g) hot cereal.  4 oz (113 g) mashed potatoes or  of a large baked potato.  4 oz (113 g) canned or frozen fruit.  4 oz (120 mL) fruit juice.  4-6 crackers.  6 chicken nuggets.  6 oz (170 g) unsweetened dry cereal.  6 oz (170 g) plain fat-free yogurt or yogurt sweetened with artificial sweeteners.  8 oz (240 mL) milk.  8 oz (170 g) fresh fruit or one small piece of fruit.  24 oz (680 g) popped popcorn. Example of carbohydrate counting Sample meal  3 oz (85 g) chicken breast.  6 oz (170 g)   brown rice.  4 oz (113 g) corn.  8 oz (240 mL) milk.  8 oz (170 g) strawberries with sugar-free whipped topping. Carbohydrate calculation 1. Identify the foods that contain carbohydrates: ? Rice. ? Corn. ? Milk. ? Strawberries. 2. Calculate how many servings you have of each food: ? 2 servings rice. ? 1 serving corn. ? 1 serving milk. ? 1 serving strawberries. 3. Multiply each number of servings by 15 g: ? 2 servings rice x 15 g = 30 g. ? 1 serving corn x 15 g = 15 g. ? 1 serving milk x 15 g = 15 g. ? 1 serving  strawberries x 15 g = 15 g. 4. Add together all of the amounts to find the total grams of carbohydrates eaten: ? 30 g + 15 g + 15 g + 15 g = 75 g of carbohydrates total. Summary  Carbohydrate counting is a method of keeping track of how many carbohydrates you eat.  Eating carbohydrates naturally increases the amount of sugar (glucose) in the blood.  Counting how many carbohydrates you eat helps keep your blood glucose within normal limits, which helps you manage your diabetes.  A diet and nutrition specialist (registered dietitian) can help you make a meal plan and calculate how many carbohydrates you should have at each meal and snack. This information is not intended to replace advice given to you by your health care provider. Make sure you discuss any questions you have with your health care provider. Document Released: 01/07/2005 Document Revised: 08/01/2016 Document Reviewed: 06/21/2015 Elsevier Patient Education  2020 [employer]  

## 2018-10-21 ENCOUNTER — Ambulatory Visit: Payer: Self-pay | Admitting: Internal Medicine

## 2018-10-21 ENCOUNTER — Other Ambulatory Visit: Payer: Self-pay

## 2018-10-21 VITALS — BP 127/86 | HR 77 | Temp 97.3°F | Ht 63.0 in | Wt 190.8 lb

## 2018-10-21 DIAGNOSIS — E1165 Type 2 diabetes mellitus with hyperglycemia: Secondary | ICD-10-CM

## 2018-10-21 DIAGNOSIS — R519 Headache, unspecified: Secondary | ICD-10-CM

## 2018-10-21 NOTE — Progress Notes (Signed)
   Subjective:    Patient ID: Angelica [name], female    DOB: [birth year], 58 y.o.   MRN: [id number]  HPI  Patient is a 58 year old female presenting for a telephonic visit. Pt reports she has been experiencing increasing frequency in headaches, occurring approximally 12 to 24h after taking ozempic injection.   Review of Systems Patient Active Problem List   Diagnosis Date Noted  . Headache 10/20/2018  . Insomnia 10/20/2018  . Anxiety 10/20/2018  . Dysuria 10/20/2018  . Encounter for screening colonoscopy   . Residual hemorrhoidal skin tags   . Internal hemorrhoids   . Polyp of ascending colon   . 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 Healthcare maintenance 09/23/2018  . Menopausal hot flushes 07/20/2015  . GERD (gastroesophageal reflux disease) 01/27/2015  . Hypertension 01/27/2015  . Hyperlipidemia 01/27/2015  . Bell palsy 08/04/2014  . Type 2 diabetes mellitus ([place]) 08/04/2014   Allergies as of 10/21/2018   No Known Allergies     Medication List       Accurate as of October 21, 2018  9:57 AM. If you have any questions, ask your nurse or doctor.        aspirin EC 81 MG tablet Take 1 tablet (81 mg total) by mouth daily.   atorvastatin 40 MG tablet Commonly known as: LIPITOR Take 1 tablet (40 mg total) by mouth daily at 6 PM.   bisacodyl 5 MG EC tablet Generic drug: bisacodyl Please  Take 2 tablets ('10mg'$ ) the day before your procedure between 1pm and 3pm   hydrochlorothiazide 12.5 MG tablet Commonly known as: HYDRODIURIL Take 1 tablet (12.5 mg total) by mouth 2 (two) times daily.   Lantus SoloStar 100 UNIT/ML Solostar Pen Generic drug: Insulin Glargine Inject 36 Units into the skin daily at 10 pm. Increase by 4 units every 3 days if fasting blood glucose is > 180 mg/dl.   metFORMIN 500 MG 24 hr tablet Commonly known as: GLUCOPHAGE-XR Take 2 tablets (1,000 mg total) by mouth daily with breakfast AND 1 tablet (500 mg total) daily before supper. What changed: See the new  instructions.   omeprazole 20 MG capsule Commonly known as: PRILOSEC Take 1 capsule (20 mg total) by mouth daily.   Ozempic (0.25 or 0.5 MG/DOSE) 2 MG/1.5ML Sopn Generic drug: Semaglutide(0.25 or 0.'5MG'$ /DOS) Inject 0.5 mg into the skin once a week.          Objective:   Physical Exam   No PE - telephonic visit     Assessment & Plan:  1. Type 2 diabetes  No changes to medication.  Has a sched appt with endocrinology in several weeks for further review.  - Comp Met (CMET); Future - CBC; Future - HgB A1c; Future - Urinalysis, Routine w reflex microscopic; Future - Lipid Profile; Future  2. Non-intractable headache  Requested patient start checking blood sugar before breakfast and supper, alternating days, to see if she is having hypoglycemic episodes that are triggering headaches.

## 2018-10-22 ENCOUNTER — Other Ambulatory Visit: Payer: Self-pay

## 2018-10-23 ENCOUNTER — Telehealth: Payer: Self-pay | Admitting: Pharmacist

## 2018-10-23 NOTE — Telephone Encounter (Signed)
10/23/2018 10:24:16 AM - Lantus Solostar refill to [facility]  10/23/2018 Faxed [employer] refill request for [employer] Max daily dose 36 units, #3.Delos Haring

## 2018-10-29 ENCOUNTER — Ambulatory Visit: Payer: Self-pay | Admitting: Licensed Clinical Social Worker

## 2018-10-29 ENCOUNTER — Other Ambulatory Visit: Payer: Self-pay

## 2018-10-29 DIAGNOSIS — F411 Generalized anxiety disorder: Secondary | ICD-10-CM

## 2018-10-29 DIAGNOSIS — F332 Major depressive disorder, recurrent severe without psychotic features: Secondary | ICD-10-CM

## 2018-10-29 NOTE — BH Specialist Note (Signed)
Integrated Behavioral Health Comprehensive Clinical Assessment Via Phone  MRN: [id number] Name: Angelica [name]  Type of Service: Integrated Behavioral Health-Individual Interpretor: No. Interpretor Name and Language:   PRESENTING CONCERNS: Angelica [name] is a 58 y.o. female accompanied by herself.[name] was referred to [facility] clinician for mental health.  Previous mental health services Have you ever been treated for a mental health problem? No If "Yes", when were you treated and whom did you see? Not applicable.  Have you ever been hospitalized for mental health treatment? No Have you ever been treated for any of the following? Past Psychiatric History/Hospitalization(s): Anxiety: Yes Angelica [name] reports that she has been dealing with symptoms of depression for the last two years. She explains that she has not felt like herself in awhile. She explains that she has problems with starting things but is unable to finish what she starts. Her symptoms of anxiety include: feeling nervous, anxious, or on edge over half the days, not being able to stop or control her worrying, worrying too much about different things, difficulty relaxing, becoming easily annoyed or irritable, and feeling afraid as if something awful might happen.  Bipolar Disorder: Negative Depression: Yes Angelica [name] has been experiencing symptoms of depression for a few years. She describes experiencing crying spells, insomnia, isolating herself away from others, irritability, loss of interest in previously enjoyed activities, feeling down and depressed nearly every day, poor appetite, difficulty concentrating, and feeling bad about herself. She denies suicidal and homicidal thoughts. There is no access to a fire arm in the home.  Mania: Negative Psychosis: Negative Schizophrenia: Negative Personality Disorder: Negative Hospitalization for psychiatric illness: Negative History of Electroconvulsive  Shock Therapy: Negative Prior Suicide Attempts: No Have you ever had thoughts of harming yourself or others or attempted suicide? No plan to harm self or others  Medical history  has a past medical history of Bell palsy (08/04/2014), GERD (gastroesophageal reflux disease) (01/27/2015), History of Bell's palsy (June 2016), Hyperlipidemia (01/27/2015), Hypertension (01/27/2015), and Type 2 diabetes mellitus ([place]) (08/04/2014). Primary Care Physician: Tawni Millers, MD Date of last physical exam:  Allergies: No Known Allergies Current medications:  Outpatient Encounter Medications as of 10/29/2018  Medication Sig  . aspirin EC 81 MG tablet Take 1 tablet (81 mg total) by mouth daily.  Marland Kitchen atorvastatin (LIPITOR) 40 MG tablet Take 1 tablet (40 mg total) by mouth daily at 6 PM.  . bisacodyl (BISACODYL) 5 MG EC tablet Please  Take 2 tablets (10mg ) the day before your procedure between 1pm and 3pm  . hydrochlorothiazide (HYDRODIURIL) 12.5 MG tablet Take 1 tablet (12.5 mg total) by mouth 2 (two) times daily.  . Insulin Glargine (LANTUS SOLOSTAR) 100 UNIT/ML Solostar Pen Inject 36 Units into the skin daily at 10 pm. Increase by 4 units every 3 days if fasting blood glucose is > 180 mg/dl.  . metFORMIN (GLUCOPHAGE-XR) 500 MG 24 hr tablet Take 2 tablets (1,000 mg total) by mouth daily with breakfast AND 1 tablet (500 mg total) daily before supper. (Patient taking differently: Take 500 mg by mouth daily with breakfast AND before supper.)  . omeprazole (PRILOSEC) 20 MG capsule Take 1 capsule (20 mg total) by mouth daily.  . Semaglutide (OZEMPIC) 0.25 or 0.5 MG/DOSE SOPN Inject 0.5 mg into the skin once a week.   No facility-administered encounter medications on file as of 10/29/2018.    Have you ever had any serious medication reactions? No Is there any history of mental  health problems or substance abuse in your family? No Has anyone in your family been hospitalized for mental health treatment? No  Social/family  history Who lives in your current household? Ms. Dudeck lives with her 61 year old mother and is her primary caregiver. She has never married. She was with the father of her 35 and 71 year old son for 19 years. She has been with her current boyfriend for 19 years. She had her oldest son at the age of 9.  What is your family of origin, childhood history? Angelica [name] was born in [place], [place].  Where were you born? See above. Where did you grow up? Angelica [name] grew up in [place], [place].  How many different homes have you lived in? Angelica [name] has lived in a few different homes.  Describe your childhood: Angelica [name] describes her childhood has been okay. She explains that although her parents worked all the time that they were loving. She denies any history of abuse or neglect. She notes that due to being the youngest sibling that a few of her older siblings would look out for her. Her mom had her at the age of 10.  Do you have siblings, step/half siblings? Yes- Angelica [name] is the youngest of eight siblings.  What are their names, relation, sex, age? See above. Three of her siblings have passed away.  Are your parents separated or divorced? No What are your social supports? Angelica [name] has the support of her niece, sister, boyfriend, a couple of friends, and a few of her fellow church members.   Education How many grades have you completed? 11th grade Did you have any problems in school? No  Employment/financial issues 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 She is unemployed because her 13 year old mom needed someone to care for her.   Sleep Usual bedtime varies. Sleeping arrangements: alone. Problems with snoring: No Obstructive sleep apnea is not a concern. Problems with nightmares: No Problems with night terrors: No Problems with sleepwalking: No  Trauma/Abuse history Have you ever experienced or been exposed to  any form of abuse? No Have you ever experienced or been exposed to something traumatic? No  Substance use Do you use alcohol, nicotine or caffeine? no alcohol use How old were you when you first tasted alcohol?  Have you ever used illicit drugs or abused prescription medications? Ms. 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 She completed a rehab program at the [facility] in [facility].   Mental status General appearance/Behavior: Unable to assess due to assessment conducted over the phone. Eye contact: Absent due to assessment conducted over the phone.  Motor behavior: Unable to assess due to assessment conducted over the phone. Speech: Normal Level of consciousness: Alert Mood: Euthymic Affect: Appropriate Anxiety level: Minimal Thought process: Coherent Thought content: WNL Perception: Normal Judgment: Good Insight: Present  Diagnosis No diagnosis found.  GOALS ADDRESSED: Patient will reduce symptoms of: anxiety, depression and insomnia and increase knowledge and/or ability of: coping skills, healthy habits, self-management skills and stress reduction and also: Increase healthy adjustment to current life circumstances              INTERVENTIONS: Interventions utilized: Psychoeducation and/or Health Education Standardized Assessments completed: GAD-7 and PHQ 9   ASSESSMENT/OUTCOME:  Lareese Schneberger is a 58 year old African American female who presents  today for a mental health assessment via phone due to [place] [address] and was referred by Carlyon Shadow, NP. Ms. Steagall has been experiencing symptoms of depression and anxiety for the last few years. She has not previously been prescribed psychotropic medications, seen a therapist, or been hospitalized for mental illness. She admits to abusing crack cocaine int he past and has been clean for 13 years. She completed a 9 month rehab program at the [facility] in [facility].   Ms. Baldner is an  established patient at [facility]. She has a history of hypertension, hemorrhoids, GERD, polyps, type II diabetes, hyperlipidemia, hot flashes from menopause, glycosuria, headaches, and insomnia. She is scheduled for a Esophagogastroduodenoscopy on October 15th and a colonoscopy on December 10th of 2020. She has no allergies. She has no history of adverse drug reactions.   Ms. Fakes lives with her 4 year old mother. She has sons: 62 and 22. She has three grandchildren: two 62 year olds and a 12 year old. She has never been married. She was with her sons's father for 19 years. She has been in a relationship with her boyfriend for 21 years. She is unemployed and is a primary care giver to her mom. She attends the [employer] [facility] [employer] in [place]. She has the support of her niece, sister, boyfriend, a couple of girlfriends, and a few church members. She denies a history of mental illness or substance abuse in the family.   PLAN:  Case consultation with Dr. Octavia Heir, MD, psychiatric consultant on Tuesday October 13th @ 9 am.  Scheduled next visit: Follow up in one week or earlier if needed.  [place] [employer]

## 2018-11-02 ENCOUNTER — Telehealth: Payer: Self-pay

## 2018-11-02 ENCOUNTER — Other Ambulatory Visit: Admission: RE | Admit: 2018-11-02 | Payer: Self-pay | Source: Ambulatory Visit

## 2018-11-02 NOTE — Telephone Encounter (Signed)
Patient is calling about her procedure scheduled on 11/05/2018. Patient states that no one told her that she had to get COVID tested for her procedure and she has received no paperwork either. Informed patient that we would have to rescheduled her appointment since COVID test was not done today. We moved procedure to 11/11/2018 and COVID test 11/06/2018. Patient verbalized understanding and will go for these. Mailed paperwork to patient

## 2018-11-03 ENCOUNTER — Ambulatory Visit: Payer: Self-pay | Admitting: Licensed Clinical Social Worker

## 2018-11-03 ENCOUNTER — Other Ambulatory Visit: Payer: Self-pay

## 2018-11-03 DIAGNOSIS — F411 Generalized anxiety disorder: Secondary | ICD-10-CM

## 2018-11-03 DIAGNOSIS — F332 Major depressive disorder, recurrent severe without psychotic features: Secondary | ICD-10-CM

## 2018-11-03 NOTE — BH Specialist Note (Signed)
Integrated Behavioral Health Follow Up Visit Via Phone  MRN: [id number] Name: Angelica [name]  Number of [place] Clinician visits: 1/6  Type of Service: [place] Interpretor:No. Interpretor Name and Language: Not applicable.   SUBJECTIVE: Angelica [name] is a 58 y.o. female accompanied by herself. Patient was referred by Carlyon Shadow, NP for mental health.  Patient reports the following symptoms/concerns: She explains that a lot of her depression has been due to loosing her child's father, her sister, two of her brothers, and a nephew in the last two years. She notes that she feels like she has not had time to grieve these losses and some days are better than others. She notes that originally when she used drugs, it was introduced to her by her child's father and she decided to try it one day. She describes feeling like her depression can inhibit her functioning some days because it can be difficult to do simple things and get out of the bed. She denies having episodes of anger. She explains that she will become irritable, snappy, and short with people some times. She notes that prior to the last two years that she used to go to her niece's house to cook, watch movies, take her grandchildren on outings, go on road trips, and be a little bit more active than she is now. She explains that some things feel like a chore now. She notes that some days she does not feel like talking or being bothered by anyone. She denies suicidal and homicidal thoughts.  Duration of problem: ; Severity of problem: severe  OBJECTIVE: Mood: Euthymic and Affect: Appropriate Risk of harm to self or others: No plan to harm self or others  LIFE CONTEXT: Family and Social: see above. School/Work: see above. Self-Care: see above. Life Changes: see above.  GOALS ADDRESSED: Patient will: 1.  Reduce symptoms of: depression  2.  Increase knowledge and/or  ability of: self-management skills  3.  Demonstrate ability to: Increase healthy adjustment to current life circumstances  INTERVENTIONS: Interventions utilized:  Brief CBT was utilized by the clinician to build and establish rapport. Clinician asked the patient some clarifying questions from her initial assessment that were left unanswered. Clinician discussed the severity of the patient's grief with her. Clinician asked the patient to clarify the reason she started to use drugs originally prior to getting clean. Clinician processed with the patient regarding the kinds of things that she did for enjoyment prior to the last two years.  Standardized Assessments completed: next session  ASSESSMENT: Patient currently experiencing see above.  Patient may benefit from see above.  PLAN: 1. Follow up with behavioral health clinician on :  2. Behavioral recommendations: Case consultation with Dr. Octavia Heir, MD, psychiatric consultant recommended that patient start with therapy with Julian Hy, LCSW and revisit the idea of psychotropic medications at a later date.  3. Referral(s): [facility] (In Clinic) 4. "From scale of 1-10, how likely are you to follow plan?":   Bayard Hugger, LCSW

## 2018-11-06 ENCOUNTER — Other Ambulatory Visit
Admission: RE | Admit: 2018-11-06 | Discharge: 2018-11-06 | Disposition: A | Discharge status: Home / Self Care | Payer: Self-pay | Source: Ambulatory Visit | Attending: Gastroenterology | Admitting: Gastroenterology

## 2018-11-06 ENCOUNTER — Other Ambulatory Visit: Payer: Self-pay

## 2018-11-06 DIAGNOSIS — Z01812 Encounter for preprocedural laboratory examination: Secondary | ICD-10-CM | POA: Insufficient documentation

## 2018-11-06 DIAGNOSIS — Z20828 Contact with and (suspected) exposure to other viral communicable diseases: Secondary | ICD-10-CM | POA: Insufficient documentation

## 2018-11-06 LAB — SARS CORONAVIRUS 2 (TAT 6-24 HRS): SARS Coronavirus 2: NEGATIVE

## 2018-11-10 ENCOUNTER — Other Ambulatory Visit: Payer: Self-pay

## 2018-11-10 ENCOUNTER — Telehealth: Payer: Self-pay | Admitting: Internal Medicine

## 2018-11-10 ENCOUNTER — Ambulatory Visit: Payer: Self-pay | Admitting: Endocrinology

## 2018-11-10 ENCOUNTER — Ambulatory Visit: Payer: Self-pay | Admitting: Specialist

## 2018-11-10 DIAGNOSIS — Z794 Long term (current) use of insulin: Secondary | ICD-10-CM

## 2018-11-10 DIAGNOSIS — E1165 Type 2 diabetes mellitus with hyperglycemia: Secondary | ICD-10-CM

## 2018-11-10 NOTE — Telephone Encounter (Signed)
Called pt to reschedule appt from 11/10/18 to 10/22 since provider was unable to come in.

## 2018-11-10 NOTE — Progress Notes (Signed)
Follow up Diabetes/ Endocrine [facility]     Patient ID: Angelica [name], female   DOB: 02/11/1960, 58 y.o.   MRN: [id number] Assessment:  Angelica [name] is a 58 y.o. female who is seen in follow up for T2DM at the request of Tawni Millers, MD.  Encounter Diagnoses T2DM  Assessment   Angelica [name] is a 58yo woman with a past medical history of Bell palsy (08/04/2014), GERD (gastroesophageal reflux disease) (01/27/2015), History of Bell's palsy (June 2016), Hyperlipidemia (01/27/2015), Hypertension (01/27/2015), and Type 2 diabetes mellitus ([place]) (08/04/2014). Today she reports fasting glucose in the 230s, down from 300+ a few months ago. She reports significant stressors that make it difficult to control her diet. She also reports blurred vision and difficulty following up with the [facility] eye doctor. She reports tolerating diabetes medications well (36 units Lantus/day, 1000mg  Metformin/day, 0.5mg  Ozempic/week), so she was recommended to increase to 40 units of Lantus to continue improving fasting glucose.   Plan:    1. Increase to 40 units of Lantus/day    2. Follow-up with eye doctor as soon as able.      Subjective:  Angelica [name] is a 58yo woman with a past medical history of Bell palsy (08/04/2014), GERD (gastroesophageal reflux disease) (01/27/2015), History of Bell's palsy (June 2016), Hyperlipidemia (01/27/2015), Hypertension (01/27/2015), and Type 2 diabetes mellitus ([place]) (08/04/2014), presenting for follow-up on management of T2DM. Today she reports feeling "alright," with significant stressors caring for her 60yo mother with Alzheimer's. Her morning fasting sugars have been in the 230s, down from the 300s prior to increasing units of Lantus earlier this year.   She is currently taking 36 units of Lantus/day, 1000mg  Metformin/day, and 0.5mg  Ozempic/week and tolerating all well.  She reports her diet is all over the place and "hard to describe". She does endorse significant blurry vision,  especially during the day, but that the eye doctor at the [facility] has been unavailable for visits.    Review of Systems  Eyes: Positive for photophobia and visual disturbance.    Angelica [name]  has a past medical history of Bell palsy (08/04/2014), GERD (gastroesophageal reflux disease) (01/27/2015), History of Bell's palsy (June 2016), Hyperlipidemia (01/27/2015), Hypertension (01/27/2015), and Type 2 diabetes mellitus ([place]) (08/04/2014).  Family History, Social History, current Medications and allergies reviewed and updated in Epic.  Objective:    Last menstrual period 03/11/2013. Physical Exam      Data : I have personally reviewed pertinent labs and imaging studies, if indicated,  with the patient in clinic today.   Lab Orders  No laboratory test(s) ordered today    HC Readings from Last 3 Encounters:  No data found for [facility]    Wt Readings from Last 3 Encounters:  10/21/18 190 lb 12.8 oz (86.5 kg)  10/20/18 190 lb (86.2 kg)  10/07/18 190 lb (86.2 kg)

## 2018-11-11 ENCOUNTER — Telehealth: Payer: Self-pay | Admitting: Pharmacist

## 2018-11-11 ENCOUNTER — Ambulatory Visit: Payer: Self-pay | Admitting: Anesthesiology

## 2018-11-11 ENCOUNTER — Ambulatory Visit
Admission: RE | Admit: 2018-11-11 | Discharge: 2018-11-11 | Disposition: A | Discharge status: Home / Self Care | Payer: Self-pay | Attending: Gastroenterology | Admitting: Gastroenterology

## 2018-11-11 ENCOUNTER — Encounter
Admission: RE | Disposition: A | Discharge status: Home / Self Care | Payer: Self-pay | Source: Home / Self Care | Attending: Gastroenterology

## 2018-11-11 DIAGNOSIS — Z538 Procedure and treatment not carried out for other reasons: Secondary | ICD-10-CM | POA: Insufficient documentation

## 2018-11-11 DIAGNOSIS — K222 Esophageal obstruction: Secondary | ICD-10-CM

## 2018-11-11 DIAGNOSIS — Z1211 Encounter for screening for malignant neoplasm of colon: Secondary | ICD-10-CM

## 2018-11-11 LAB — GLUCOSE, CAPILLARY: Glucose-Capillary: 204 mg/dL — ABNORMAL HIGH (ref 70–99)

## 2018-11-11 SURGERY — ESOPHAGOGASTRODUODENOSCOPY (EGD) WITH PROPOFOL
Anesthesia: General

## 2018-11-11 MED ORDER — SODIUM CHLORIDE 0.9 % IV SOLN: INTRAVENOUS | Status: DC

## 2018-11-11 MED ORDER — SODIUM CHLORIDE 0.9 % IV SOLN
INTRAVENOUS | Status: DC
  Administered 2018-11-11: 1000 mL via INTRAVENOUS

## 2018-11-11 NOTE — Anesthesia Preprocedure Evaluation (Deleted)
Anesthesia Evaluation  Patient identified by MRN, date of birth, ID band Patient awake    Reviewed: Allergy & Precautions, NPO status , Patient's Chart, lab work & pertinent test results  History of Anesthesia Complications Negative for: history of anesthetic complications  Airway Mallampati: II  TM Distance: >3 FB Neck ROM: Full    Dental  (+) Missing, Poor Dentition   Pulmonary neg pulmonary ROS, neg sleep apnea, neg COPD,    breath sounds clear to auscultation- rhonchi (-) wheezing      Cardiovascular hypertension, (-) CAD, (-) Past MI, (-) Cardiac Stents and (-) CABG  Rhythm:Regular Rate:Normal - Systolic murmurs and - Diastolic murmurs    Neuro/Psych  Headaches, neg Seizures Anxiety    GI/Hepatic Neg liver ROS, GERD  ,  Endo/Other  diabetes, Insulin Dependent  Renal/GU negative Renal ROS     Musculoskeletal negative musculoskeletal ROS (+)   Abdominal (+) + obese,   Peds  Hematology negative hematology ROS (+)   Anesthesia Other Findings Past Medical History: 08/04/2014: Bell palsy 01/27/2015: GERD (gastroesophageal reflux disease) June 2016: History of Bell's palsy 01/27/2015: Hyperlipidemia 01/27/2015: Hypertension 08/04/2014: Type 2 diabetes mellitus (HCC)   Reproductive/Obstetrics                             Anesthesia Physical  Anesthesia Plan  ASA: II  Anesthesia Plan: General   Post-op Pain Management:    Induction: Intravenous  PONV Risk Score and Plan: 2 and Propofol infusion  Airway Management Planned: Natural Airway  Additional Equipment:   Intra-op Plan:   Post-operative Plan:   Informed Consent: I have reviewed the patients History and Physical, chart, labs and discussed the procedure including the risks, benefits and alternatives for the proposed anesthesia with the patient or authorized representative who has indicated his/her understanding and acceptance.      Dental advisory given  Plan Discussed with: CRNA and Anesthesiologist  Anesthesia Plan Comments:         Anesthesia Quick Evaluation

## 2018-11-11 NOTE — H&P (Signed)
This procedure was cancelled as pt does not have any history of esophageal stricture. Had an EGD last year at [place] (that we werent aware of until today) for Reflux and was reportedly normal. Pt does not have any worsening reflux or dysphagia or odynophagia and no indication for EGD at this time. Pt denies any of these symptoms and is agreeable with the plan. The procedure was scheduled in error.

## 2018-11-11 NOTE — Telephone Encounter (Signed)
11/11/2018 10:57:05 AM - Ozempic refill to provider  11/11/2018 Putting Novo Nordisk refill request in [facility] folder for Dr. Mable Fill to sign for Ozempic Inject 0.5mg  once a week #5 boxes.Delos Haring

## 2018-11-12 ENCOUNTER — Ambulatory Visit: Payer: Self-pay | Admitting: Licensed Clinical Social Worker

## 2018-11-12 ENCOUNTER — Other Ambulatory Visit: Payer: Self-pay

## 2018-11-12 ENCOUNTER — Ambulatory Visit: Payer: Self-pay | Admitting: Specialist

## 2018-11-12 DIAGNOSIS — M25561 Pain in right knee: Secondary | ICD-10-CM

## 2018-11-12 MED ORDER — NAPROXEN 500 MG PO TABS: 500.0000 mg | ORAL_TABLET | Freq: Two times a day (BID) | ORAL | 0 refills | Status: AC

## 2018-11-12 NOTE — Patient Instructions (Signed)
Xray right including PF view and standing AP Napersyn  500 mg BID  #30 RTC 12/01/18

## 2018-11-12 NOTE — Progress Notes (Signed)
°  Subjective:     Patient ID: Angelica [name], female   DOB: January 15, 1961, 58 y.o.   MRN: [id number]  HPI  Pt is 58 years I=old with cronic right knee pain. She has pain with going up stairs and is unable to sit long with her knees flexed. She is unable to squat to get things out of the lower [place].   Review of Systems     Objective:   Physical Exam  Gate: Nl.  She is able too stand on toes and take one or two steps. She is unable to stand on heel because of a pulling sensation. She declines to do a squat. 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 She has a tattoo on her right lateral leg. She has full extension of both knees w/o crepitus. Her "Q" angle is Nl. Her patella tracks Normally. Patella impression causes pain. There is a 1+ effusion. PROM -5 to 90 degrees. N full extention and 30 degrees no ML laxety Lachmon -. She is TTP over the lateral joint line.      Assessment:      Diagnosis: Right knee effusion  ? LAT. Joint desease    Plan:        Recommendation: 1. X-Ray right knee including PF view and standing AP 2. Napersyn 500 MG BID #30 3. RTC 12/01/18

## 2018-11-17 ENCOUNTER — Ambulatory Visit
Admission: RE | Admit: 2018-11-17 | Discharge: 2018-11-17 | Disposition: A | Discharge status: Home / Self Care | Payer: Self-pay | Source: Ambulatory Visit | Attending: Specialist | Admitting: Specialist

## 2018-11-17 ENCOUNTER — Ambulatory Visit
Admission: RE | Admit: 2018-11-17 | Discharge: 2018-11-17 | Disposition: A | Discharge status: Home / Self Care | Payer: Self-pay | Attending: Specialist | Admitting: Specialist

## 2018-11-17 ENCOUNTER — Other Ambulatory Visit: Payer: Self-pay

## 2018-11-17 DIAGNOSIS — M25561 Pain in right knee: Secondary | ICD-10-CM | POA: Insufficient documentation

## 2018-11-17 IMAGING — CR DG KNEE COMPLETE 4+V*R*
1 series · 4 of 4 positions shown · non-contrast
Comparison: [DATE]

CLINICAL DATA: Knee pain and swelling anteriorly

EXAM:
RIGHT KNEE - COMPLETE 4+ VIEW

[Series 1: dg knee complete 4 views right · 0.14mm/px · 4 of 4 slices shown]
[im 1/4]
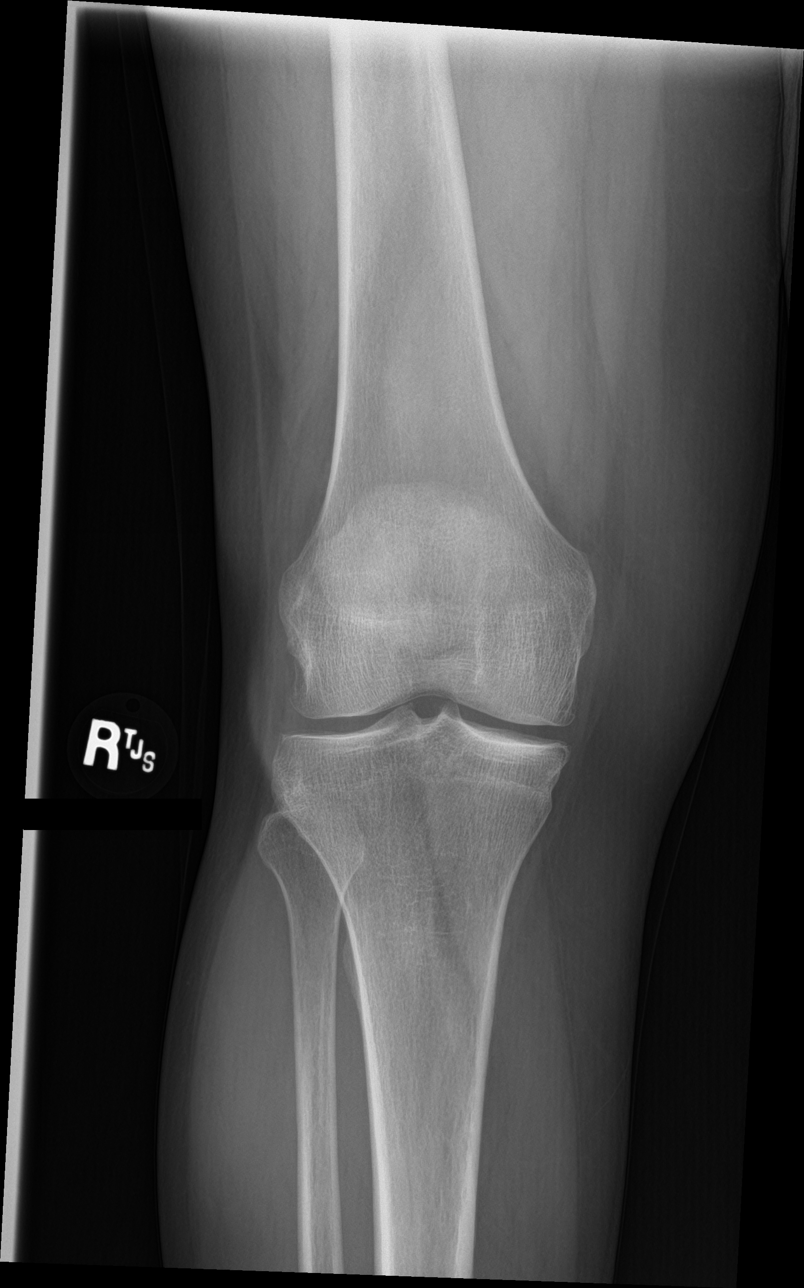
[im 2/4]
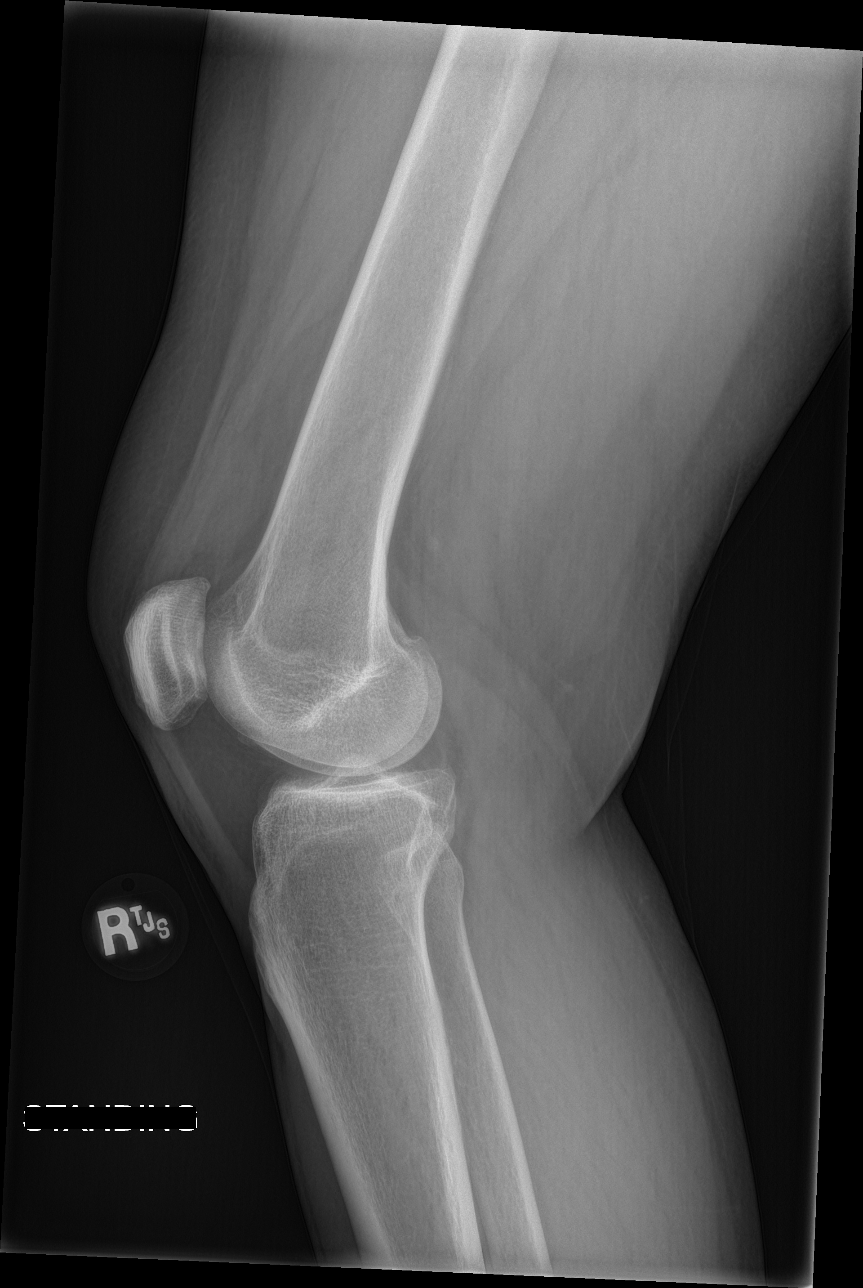
[im 3/4]
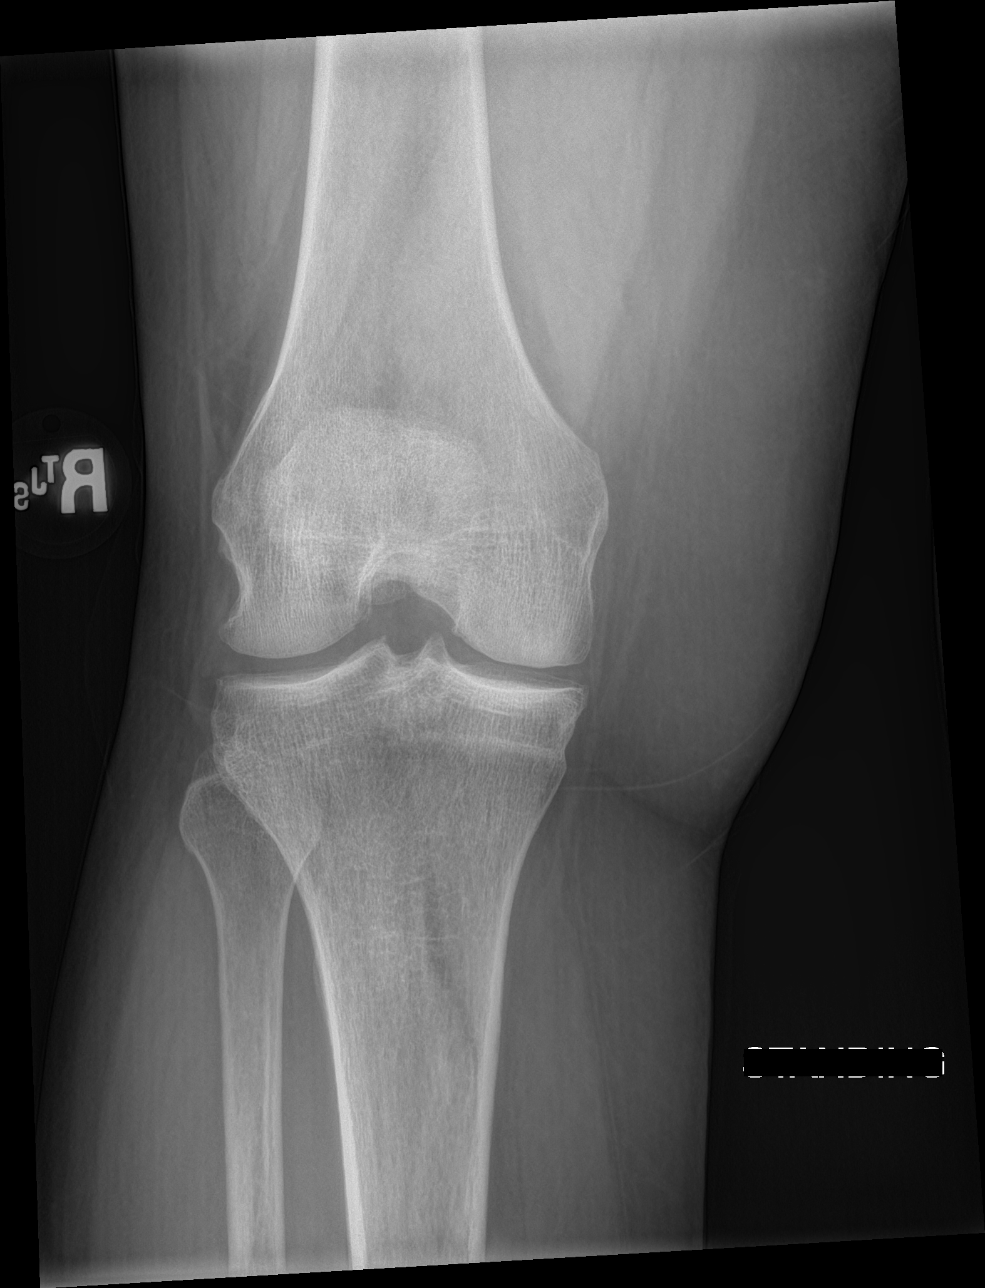
[im 4/4]
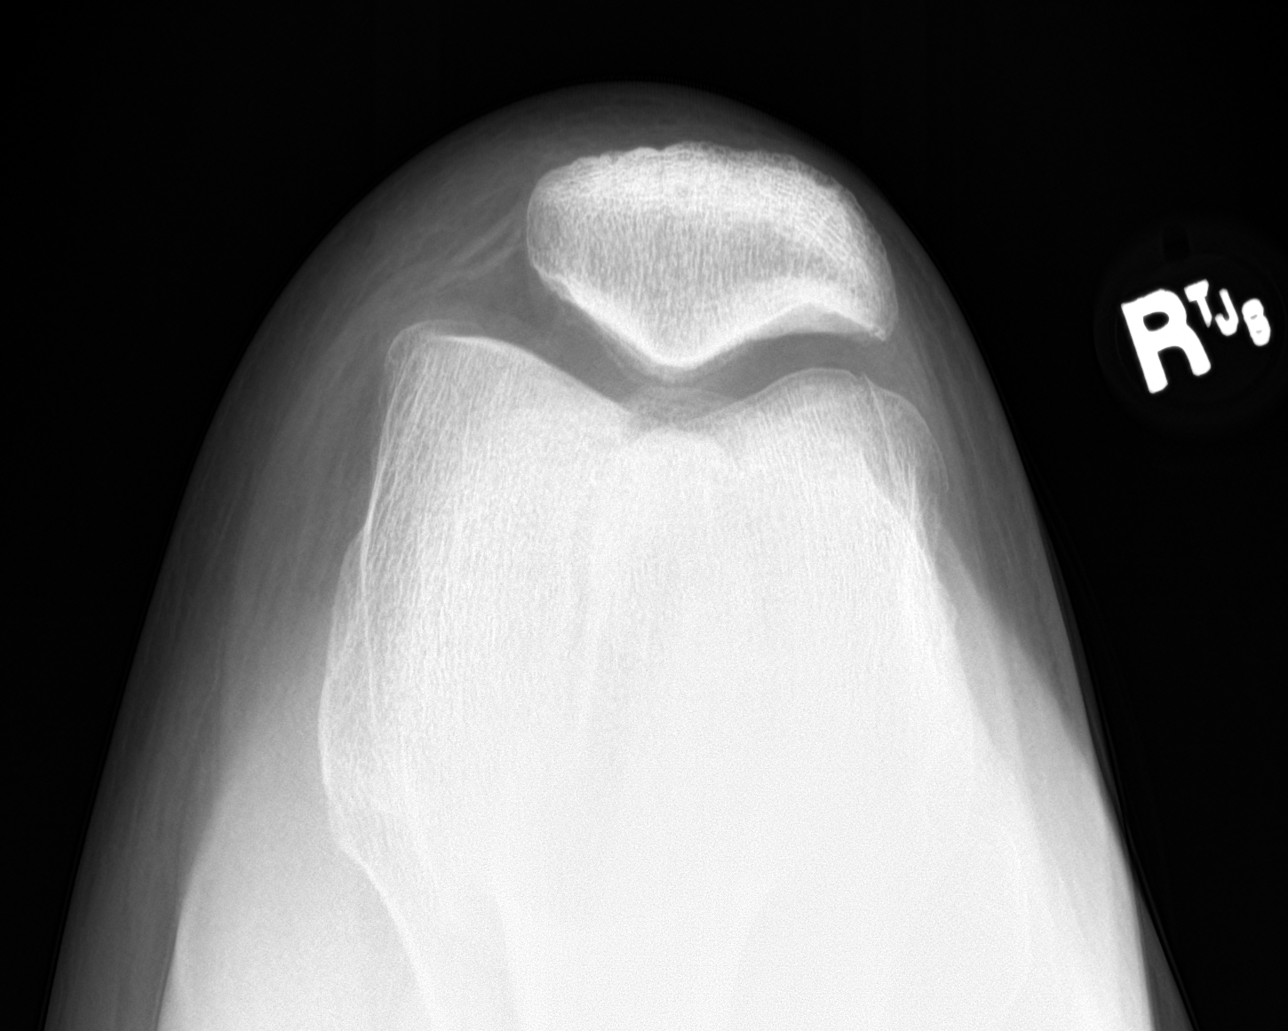

[4 of 4 positions shown; findings below may reference images not displayed]

FINDINGS: No acute fracture. Minimal medial compartment joint space narrowing
with tiny marginal osteophyte formation. Small knee joint effusion.
Osseous structures are otherwise within normal limits.
IMPRESSION: 1. Stable mild medial compartment osteoarthritis.
2. Small knee joint effusion, nonspecific.

## 2018-11-19 ENCOUNTER — Telehealth: Payer: Self-pay | Admitting: Pharmacist

## 2018-11-19 NOTE — Telephone Encounter (Signed)
11/19/2018 8:28:45 AM - Ozempic refill faxed to [facility]  11/19/2018 [facility] refill for [employer] 0.5mg  once weekly #5 boxes.Delos Haring

## 2018-11-25 ENCOUNTER — Ambulatory Visit
Admission: RE | Admit: 2018-11-25 | Discharge: 2018-11-25 | Disposition: A | Discharge status: Home / Self Care | Payer: Self-pay | Source: Ambulatory Visit | Attending: Oncology | Admitting: Oncology

## 2018-11-25 ENCOUNTER — Other Ambulatory Visit: Payer: Self-pay

## 2018-11-25 ENCOUNTER — Encounter: Payer: Self-pay | Admitting: *Deleted

## 2018-11-25 ENCOUNTER — Ambulatory Visit: Payer: Self-pay | Attending: Oncology | Admitting: *Deleted

## 2018-11-25 VITALS — BP 141/95 | HR 76 | Temp 98.3°F | Resp 16 | Ht 64.0 in | Wt 191.5 lb

## 2018-11-25 DIAGNOSIS — Z0000 Encounter for general adult medical examination without abnormal findings: Secondary | ICD-10-CM

## 2018-11-25 IMAGING — MG DIGITAL SCREENING BILAT W/ TOMO W/ CAD
7 series · 8 of 19 positions shown · non-contrast
Comparison: Previous exam(s).

CLINICAL DATA: Screening.

EXAM:
DIGITAL SCREENING BILATERAL MAMMOGRAM WITH TOMO AND CAD

[L CC synth-2D]
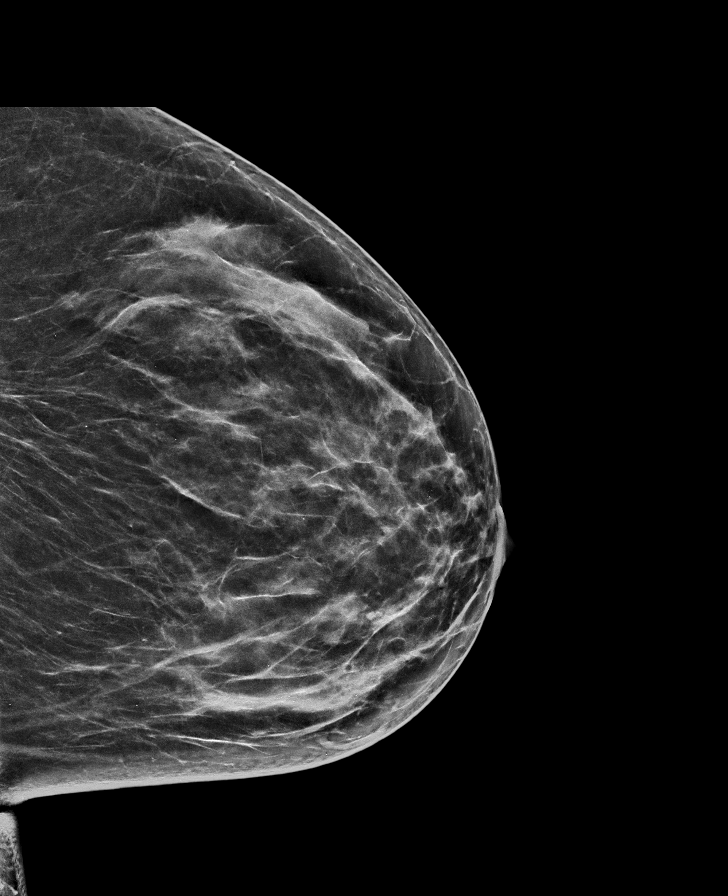

[L MLO synth-2D]
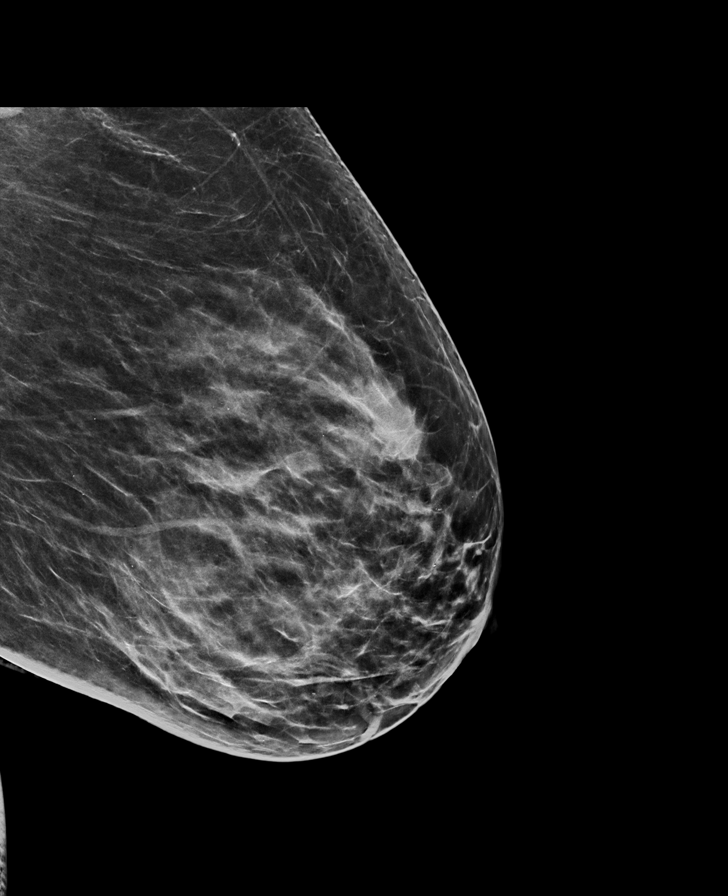

[R CC synth-2D]
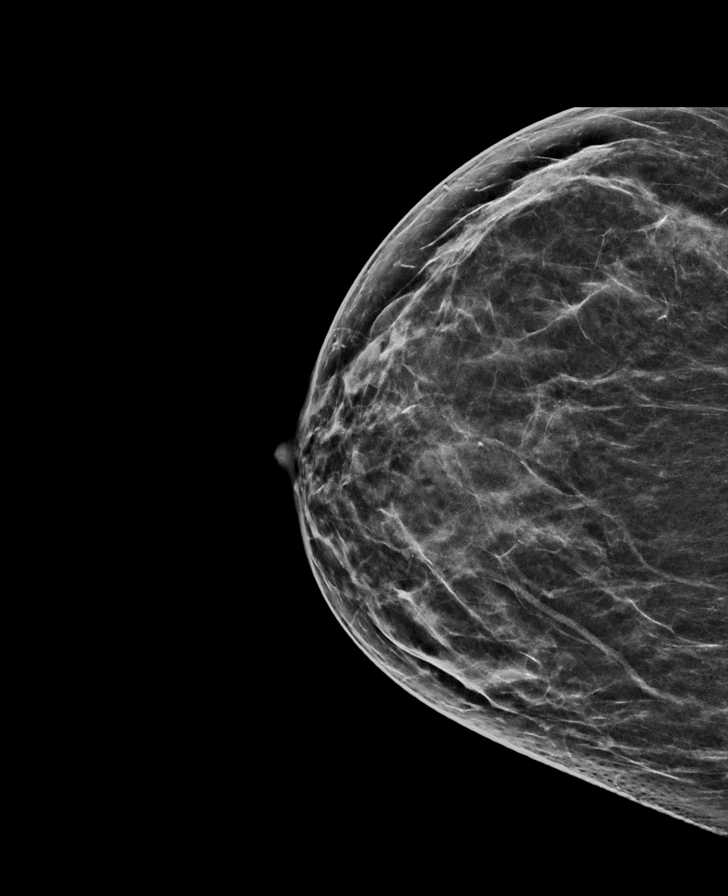

[R MLO synth-2D]
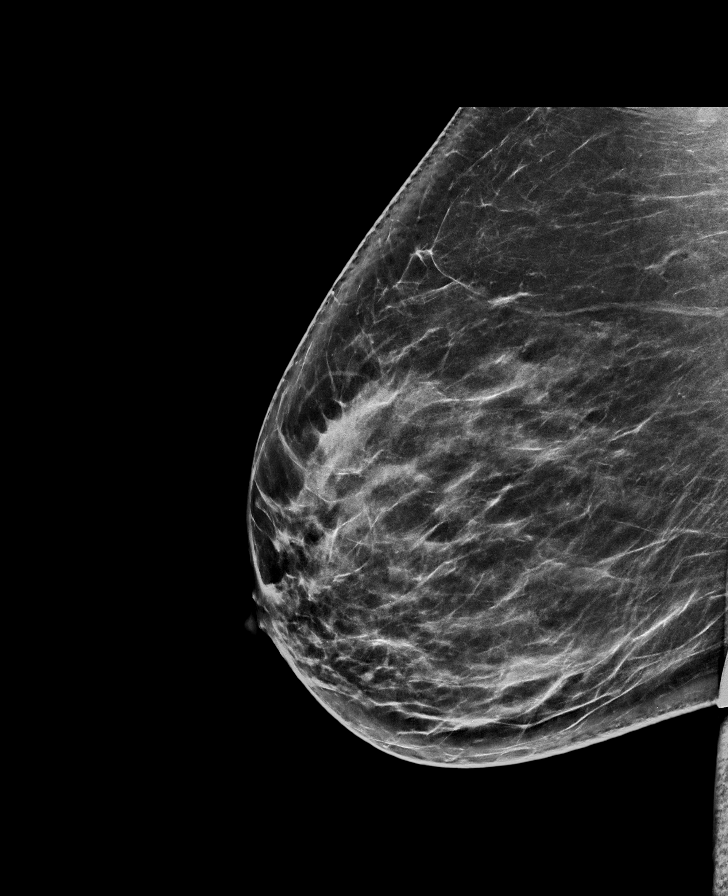

[L MLO tomo · 2 of 78 frames shown]
[frame 26/78]
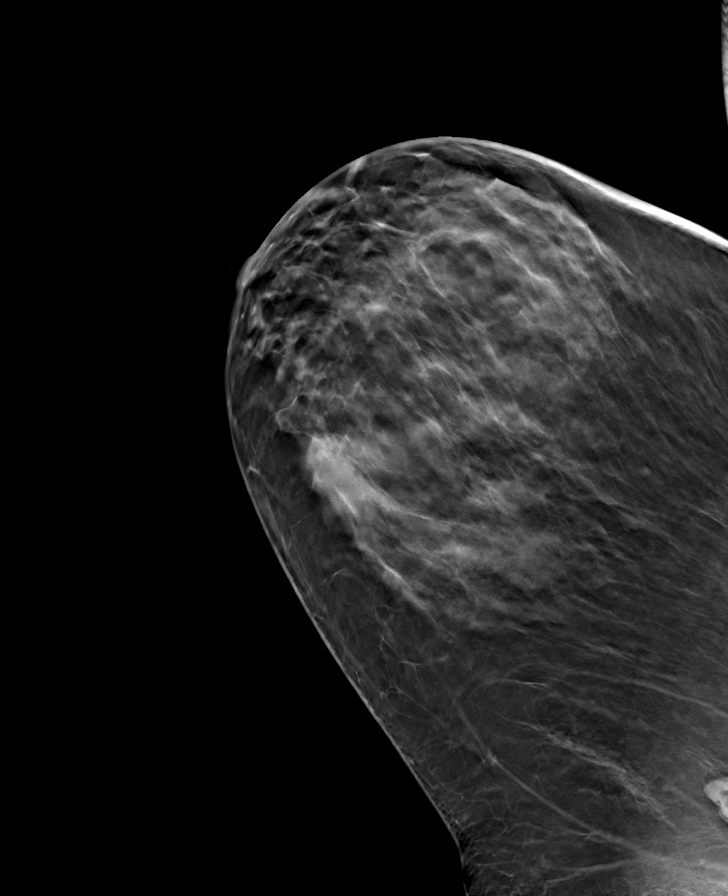
[frame 39/78]
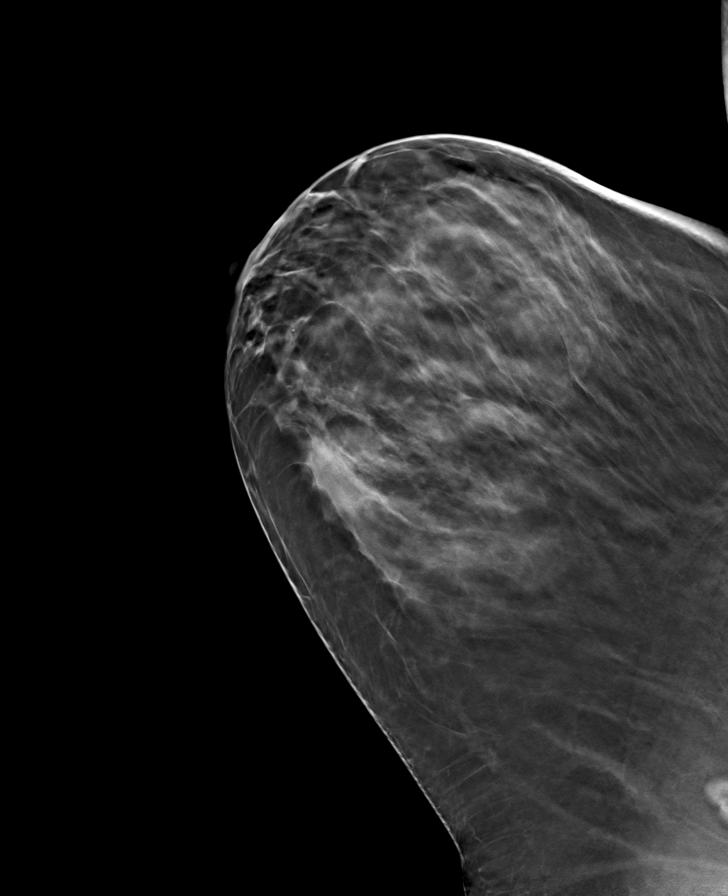

[R MLO tomo · tomo slice 40/79.0]
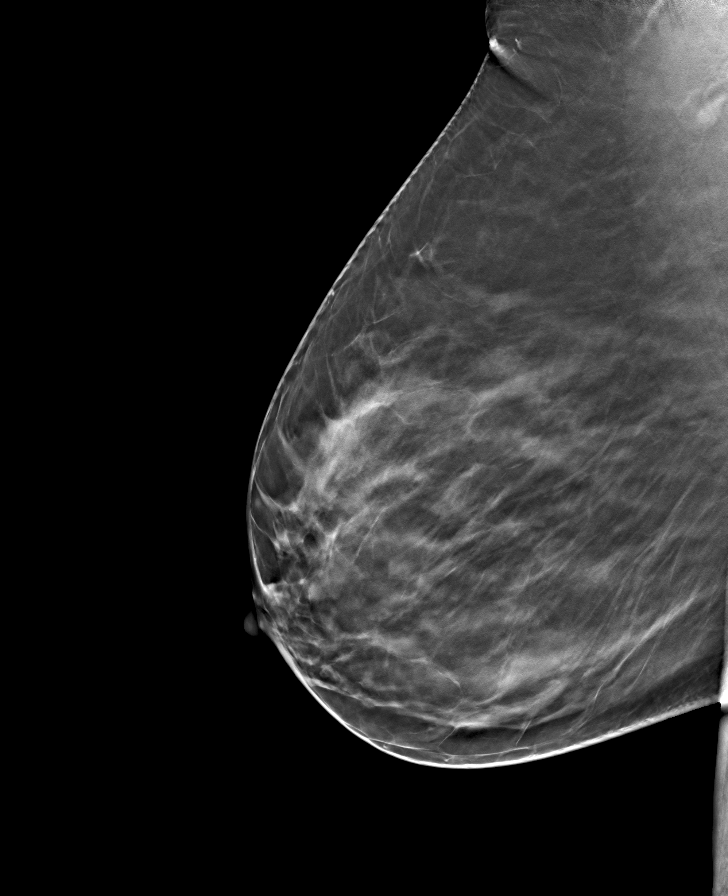

[R CC tomo · tomo slice 31/62.0]
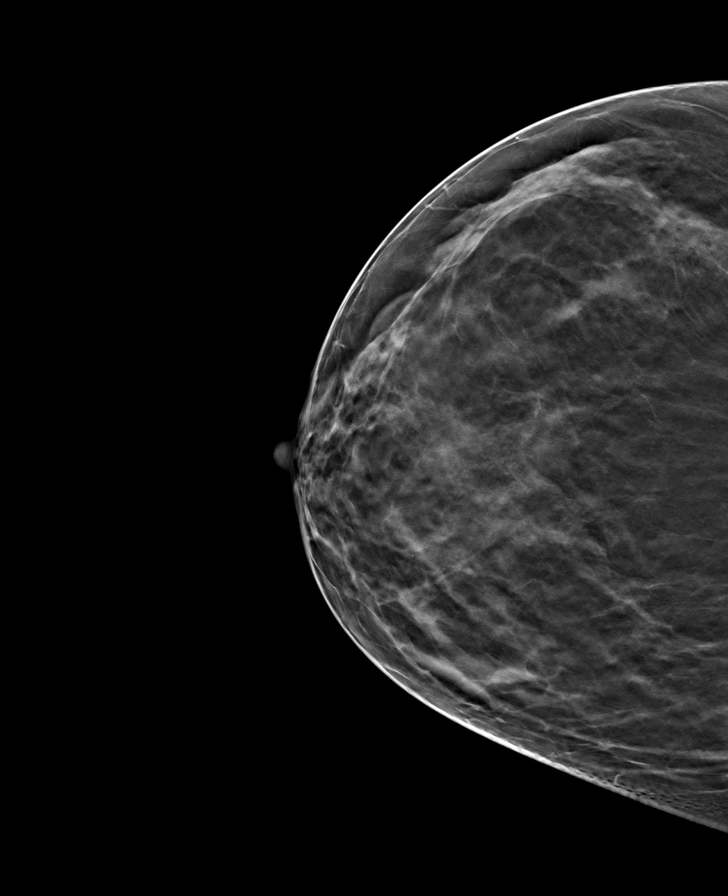

[8 of 19 positions shown; findings below may reference images not displayed]

ACR Breast Density Category c: The breast tissue is heterogeneously
dense, which may obscure small masses.
FINDINGS: There are no findings suspicious for malignancy. Images were
processed with CAD.
IMPRESSION: No mammographic evidence of malignancy. A result letter of this
screening mammogram will be mailed directly to the patient.

RECOMMENDATION:
Screening mammogram in one year. (Code:[5V])

BI-RADS CATEGORY  1: Negative.

## 2018-11-25 NOTE — Progress Notes (Signed)
  Subjective:     Patient ID: Angelica [name], female   DOB: 12/15/1960, 58 y.o.   MRN: [id number]  HPI   Review of Systems     Objective:   Physical Exam Chest:     Breasts:        Right: No swelling, bleeding, inverted nipple, mass, nipple discharge, skin change or tenderness.        Left: No swelling, bleeding, inverted nipple, mass, nipple discharge, skin change or tenderness.  Abdominal:     Palpations: There is no hepatomegaly or splenomegaly.    Genitourinary:    Exam position: Lithotomy position.     Labia:        Right: No rash, tenderness, lesion or injury.        Left: No rash, tenderness, lesion or injury.      Urethra: No prolapse, urethral pain, urethral swelling or urethral lesion.     Vagina: No signs of injury and foreign body. Vaginal discharge present. No erythema, tenderness, bleeding or lesions.     Cervix: No cervical motion tenderness, friability or erythema.        Comments: Supracervical hysterectomy Lymphadenopathy:     Upper Body:     Right upper body: No supraclavicular or axillary adenopathy.     Left upper body: No supraclavicular or axillary adenopathy.        Assessment:     58 year old Black female returns to [facility] for annual screening.  Clinical breast exam unremarkable.  Taught self breast awareness.  Specimen collected for pap smear since she had a supracervical hysterectomy.  Tyrer-Cuzick breast cancer risk assessment with a lifetime risk of 6.5%.  Per NCCN guidelines no further imagaing or genetic testing is recommended.  Patient has been screened for eligibility.  She does not have any insurance, Medicare or Medicaid.  She also meets financial eligibility.  Hand-out given on the Affordable Care Act.    Plan:     Screening mammogram ordered.  Specimen for pap sent to the lab.  Will follow up per BCCCP protocol.

## 2018-11-25 NOTE — Patient Instructions (Signed)
Gave patient hand-out, Women Staying Healthy, Active and Well from [facility], with education on breast health, pap smears, heart and colon health. 

## 2018-11-26 ENCOUNTER — Ambulatory Visit: Payer: Self-pay | Admitting: Licensed Clinical Social Worker

## 2018-11-26 ENCOUNTER — Telehealth: Payer: Self-pay | Admitting: Licensed Clinical Social Worker

## 2018-11-26 NOTE — Telephone Encounter (Signed)
Clinician attempted to contact the patient twice during their scheduled phone visit at 6 pm and left a voicemail with call back information.

## 2018-12-02 ENCOUNTER — Ambulatory Visit: Payer: Self-pay | Admitting: Internal Medicine

## 2018-12-03 LAB — PAP LB AND HPV HIGH-RISK: HPV, high-risk: NEGATIVE

## 2018-12-07 ENCOUNTER — Encounter: Payer: Self-pay | Admitting: *Deleted

## 2018-12-07 ENCOUNTER — Other Ambulatory Visit: Payer: Self-pay

## 2018-12-07 NOTE — Progress Notes (Signed)
Letter mailed to inform patient of her normal mammogram and pap smear results.  Nex mammo in 1 year and pap smear in 5 years.  HSIS to [place].

## 2018-12-09 ENCOUNTER — Other Ambulatory Visit: Payer: Self-pay

## 2018-12-09 ENCOUNTER — Ambulatory Visit: Payer: Self-pay | Admitting: Internal Medicine

## 2018-12-09 DIAGNOSIS — M25561 Pain in right knee: Secondary | ICD-10-CM

## 2018-12-09 DIAGNOSIS — E119 Type 2 diabetes mellitus without complications: Secondary | ICD-10-CM

## 2018-12-09 DIAGNOSIS — I10 Essential (primary) hypertension: Secondary | ICD-10-CM

## 2018-12-09 NOTE — Progress Notes (Signed)
   Subjective:    Patient ID: Angelica [name], female    DOB: 16-Dec-1960, 58 y.o.   MRN: [id number]  HPI  Patient is a 58 year old female presenting for a telephonic visit. Pt reports she is doing well. Pt reports she checks her blood sugar every other day in the mornings.   Review of Systems Patient Active Problem List   Diagnosis Date Noted  . Headache 10/20/2018  . Insomnia 10/20/2018  . Anxiety 10/20/2018  . Dysuria 10/20/2018  . Encounter for screening colonoscopy   . Residual hemorrhoidal skin tags   . Internal hemorrhoids   . Polyp of ascending colon   . 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 Healthcare maintenance 09/23/2018  . Menopausal hot flushes 07/20/2015  . GERD (gastroesophageal reflux disease) 01/27/2015  . Hypertension 01/27/2015  . Hyperlipidemia 01/27/2015  . Bell palsy 08/04/2014  . Type 2 diabetes mellitus ([place]) 08/04/2014   Allergies as of 12/09/2018   No Known Allergies     Medication List       Accurate as of December 09, 2018 11:13 AM. If you have any questions, ask your nurse or doctor.        aspirin EC 81 MG tablet Take 1 tablet (81 mg total) by mouth daily.   atorvastatin 40 MG tablet Commonly known as: LIPITOR Take 1 tablet (40 mg total) by mouth daily at 6 PM.   bisacodyl 5 MG EC tablet Generic drug: bisacodyl Please  Take 2 tablets (45m) the day before your procedure between 1pm and 3pm   hydrochlorothiazide 12.5 MG tablet Commonly known as: HYDRODIURIL Take 1 tablet (12.5 mg total) by mouth 2 (two) times daily.   Lantus SoloStar 100 UNIT/ML Solostar Pen Generic drug: Insulin Glargine Inject 36 Units into the skin daily at 10 pm. Increase by 4 units every 3 days if fasting blood glucose is > 180 mg/dl. What changed: how much to take   metFORMIN 500 MG 24 hr tablet Commonly known as: GLUCOPHAGE-XR Take 2 tablets (1,000 mg total) by mouth daily with breakfast AND 1 tablet (500 mg total) daily before supper. What changed: See the new  instructions.   naproxen 500 MG tablet Commonly known as: Naprosyn Take 1 tablet (500 mg total) by mouth 2 (two) times daily with a meal.   omeprazole 20 MG capsule Commonly known as: PRILOSEC Take 1 capsule (20 mg total) by mouth daily.   Ozempic (0.25 or 0.5 MG/DOSE) 2 MG/1.5ML Sopn Generic drug: Semaglutide(0.25 or 0.5MG/DOS) Inject 0.5 mg into the skin once a week.          Objective:   Physical Exam  No PE - telephonic visit      Assessment & Plan:  1. Essential hypertension Pt does not have a self monitoring unit. Will plan to check BP at next lab appointment and see if she qualifies for a home monitoring kit. - Comp Met (CMET); Future - CBC; Future - UA/M w/rflx Culture, Routine; Future  2. Diabetes mellitus without complication ([facility] review a few weeks ago increased her Lantus to 40 units at night. Reports self monitored with sugars improved in the 100s.   FU labs planned for end of December. - HgB A1c; Future  3. Right knee pain, unspecified chronicity Recent X-rays show degenerative arthritis with small effusion. Pt has been seen by out orthopedic specialist. I recommended no additional FU at this time, but subjective treatment with anti-inflammatory.

## 2018-12-11 ENCOUNTER — Other Ambulatory Visit: Payer: Self-pay

## 2018-12-15 ENCOUNTER — Ambulatory Visit: Payer: Self-pay | Admitting: Pharmacist

## 2018-12-15 DIAGNOSIS — Z79899 Other long term (current) drug therapy: Secondary | ICD-10-CM

## 2018-12-15 NOTE — Progress Notes (Signed)
Medication Management Clinic Visit Note  Patient: Angelica [name] MRN: [id number] Date of Birth: 1961-01-12 PCP: Tawni Millers, MD   [name] 58 y.o. female presents for an initial MTM visit today.   LMP 03/11/2013 (Within Months)   Patient Information   Past Medical History:  Diagnosis Date  . Bell palsy 08/04/2014  . GERD (gastroesophageal reflux disease) 01/27/2015  . History of Bell's palsy June 2016  . Hyperlipidemia 01/27/2015  . Hypertension 01/27/2015  . Type 2 diabetes mellitus ([place]) 08/04/2014      Past Surgical History:  Procedure Laterality Date  . COLONOSCOPY WITH PROPOFOL N/A 10/07/2018   Procedure: COLONOSCOPY WITH PROPOFOL;  Surgeon: Virgel Manifold, MD;  Location: [facility] ENDOSCOPY;  Service: Gastroenterology;  Laterality: N/A;  . OOPHORECTOMY Right 2005     Family History  Problem Relation Age of Onset  . Cancer Brother        Colon Cancer  . Breast cancer Neg Hx     Lifestyle Diet: She reports inconsistencies in diet, and reports eating a diet of corn, beans, baked/grilled meat.  She says that she knows what is 'good' for her, and what is 'bad' for her.    Health Maintenance  Topic Date Due  . TETANUS/TDAP  12/18/1981  . OPHTHALMOLOGY EXAM  12/27/2011  . INFLUENZA VACCINE  04/21/2019 (Originally 08/22/2018)  . HEMOGLOBIN A1C  03/30/2019  . URINE MICROALBUMIN  09/30/2019  . FOOT EXAM  10/20/2019  . MAMMOGRAM  11/24/2020  . PAP SMEAR-Modifier  11/24/2021  . COLONOSCOPY  10/06/2028  . PNEUMOCOCCAL POLYSACCHARIDE VACCINE AGE 96-64 HIGH RISK  Completed  . Hepatitis C Screening  Completed  . HIV Screening  Completed     Assessment and Plan:  Health Maintenance/Date Completed  Last ED visit: ? Last Visit to PCP: A couple months back Next Visit to PCP: Unsure Specialist Visit: No Dental Exam: No Eye Exam: No Prostate Exam: NA Pelvic/PAP Exam: Last month Mammogram: Last onth DEXA: NA Colonoscopy: Last month Flu Vaccine: No Pneumonia  Vaccine: NA   Alcohol:  None  Smoking: None   Adherence:  Pharmacy records reveal inconsistencies in refill history.  She currently takes care of two family members, one of which is her 61 year-old mother.  Both have Alzheimers.  She admits to having a very inconsistent schedule, and finds it difficult to take her medications, especially her metformin.  Encouraged to find routines (centering around their care), which might help her better take her metformin.   Renal function:   Baseline Scr appears to be ~0.5-0.7.  GFR (AA) >100 in Sep 2020.     Diabetes, Type 2, uncontrolled:   Ozempic 0.5 mg once weekly, Lantus 40 units daily, metformin 1000 mg with breakfast and 500 mg with supper.  A1c 13.1 in Sep 2020, increased from 7.9 six months prior.  Patient denies any nausea or GI discomfort on these medications.   Patient checks BG 1-2 times daily with, and says BG averages around 180.  She walks for exercise.  Encouraged her to eat more non-starchy vegetables over corn and bean products, as these could raise her BG.   HTN:  HCTZ 12.5 mg BID.  Patient does not currently take her BP at home, and endorses normal BP when she goes to her doctor.  She denies dizziness, lightheadedness, and symptoms of orthostasis.   GERD:  Omeprazole 20 mg daily.  Patient is relatively controlled most of the time, but takes 40 mg daily if needed.  HLD:  Atorvastatin   Follow-up:  6 months, as she is currently an uncontrolled diabetic.  Gerald Dexter, PharmD Pharmacy Resident  12/15/2018 10:27 AM

## 2018-12-16 ENCOUNTER — Other Ambulatory Visit: Payer: Self-pay

## 2018-12-23 ENCOUNTER — Other Ambulatory Visit: Payer: Self-pay

## 2018-12-28 ENCOUNTER — Telehealth: Payer: Self-pay

## 2018-12-28 ENCOUNTER — Other Ambulatory Visit
Admission: RE | Admit: 2018-12-28 | Discharge: 2018-12-28 | Disposition: A | Discharge status: Home / Self Care | Payer: Self-pay | Source: Ambulatory Visit | Attending: Gastroenterology | Admitting: Gastroenterology

## 2018-12-28 ENCOUNTER — Other Ambulatory Visit: Payer: Self-pay

## 2018-12-28 DIAGNOSIS — U071 COVID-19: Secondary | ICD-10-CM | POA: Insufficient documentation

## 2018-12-28 DIAGNOSIS — Z01812 Encounter for preprocedural laboratory examination: Secondary | ICD-10-CM | POA: Insufficient documentation

## 2018-12-28 LAB — SARS CORONAVIRUS 2 (TAT 6-24 HRS): SARS Coronavirus 2: POSITIVE — AB

## 2018-12-28 MED ORDER — NA SULFATE-K SULFATE-MG SULF 17.5-3.13-1.6 GM/177ML PO SOLN: 354.0000 mL | Freq: Once | ORAL | 0 refills | Status: AC

## 2018-12-28 NOTE — Telephone Encounter (Signed)
Patient pharmacy states both preps are on back order. They state the only thing they have is the Miralax. Called patient to see if I could call in Prep to another pharmacy. Called and left a message for call back

## 2018-12-28 NOTE — Telephone Encounter (Signed)
Patient states the pharmacy does not have her prep. Resent it to the pharmacy

## 2018-12-28 NOTE — Telephone Encounter (Signed)
Patient called back and states she does not have insurance and does not have any money to get medication any where else. We had a sample of Plenvu gave sample to patient. Patient will come pick this up

## 2018-12-29 ENCOUNTER — Telehealth: Payer: Self-pay

## 2018-12-29 ENCOUNTER — Telehealth: Payer: Self-pay | Admitting: Pharmacist

## 2018-12-29 ENCOUNTER — Telehealth: Payer: Self-pay | Admitting: Nurse Practitioner

## 2018-12-29 NOTE — Telephone Encounter (Signed)
Called to Discuss with patient about Covid symptoms and the use of bamlanivimab, a monoclonal antibody infusion for those with mild to moderate Covid symptoms and at a high risk of hospitalization.     Pt is qualified for this infusion at the [facility] infusion center due to co-morbid conditions and/or a member of an at-risk group.    Patient is being managed for the following:  Patient Active Problem List   Diagnosis Date Noted  . Headache 10/20/2018  . Insomnia 10/20/2018  . Anxiety 10/20/2018  . Dysuria 10/20/2018  . Encounter for screening colonoscopy   . Residual hemorrhoidal skin tags   . Internal hemorrhoids   . Polyp of ascending colon   . 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 Healthcare maintenance 09/23/2018  . Menopausal hot flushes 07/20/2015  . GERD (gastroesophageal reflux disease) 01/27/2015  . Hypertension 01/27/2015  . Hyperlipidemia 01/27/2015  . Bell palsy 08/04/2014  . Type 2 diabetes mellitus ([place]) 08/04/2014   Patient declines treatment.

## 2018-12-29 NOTE — Telephone Encounter (Signed)
12/29/2018 3:32:42 PM - Lantus Solostar refill to [facility]  12/29/2018 Faxed [employer] refill request for [employer] Max daily dose 36 units #3.Delos Haring

## 2018-12-29 NOTE — Telephone Encounter (Signed)
Patient had her COVID test yesterday for her procedure on 12/31/2018. Trish called and stated the patient was positive  For COVID . Called and informed patient and informed patient her procedure would be cancel and that she needed to contact her PCP for instructions on what she needs to do. Patient states she will do this. Faxed results to PCP

## 2018-12-30 ENCOUNTER — Other Ambulatory Visit: Payer: Self-pay

## 2018-12-31 ENCOUNTER — Encounter: Admission: RE | Payer: Self-pay | Source: Home / Self Care

## 2018-12-31 ENCOUNTER — Ambulatory Visit: Admission: RE | Admit: 2018-12-31 | Payer: Self-pay | Source: Home / Self Care | Admitting: Gastroenterology

## 2018-12-31 SURGERY — COLONOSCOPY WITH PROPOFOL
Anesthesia: General

## 2019-01-02 ENCOUNTER — Encounter: Payer: Self-pay | Admitting: Emergency Medicine

## 2019-01-02 ENCOUNTER — Emergency Department: Payer: Self-pay

## 2019-01-02 ENCOUNTER — Other Ambulatory Visit: Payer: Self-pay

## 2019-01-02 ENCOUNTER — Emergency Department
Admission: EM | Admit: 2019-01-02 | Discharge: 2019-01-02 | Disposition: A | Discharge status: Home / Self Care | Payer: Self-pay | Attending: Emergency Medicine | Admitting: Emergency Medicine

## 2019-01-02 DIAGNOSIS — E119 Type 2 diabetes mellitus without complications: Secondary | ICD-10-CM | POA: Insufficient documentation

## 2019-01-02 DIAGNOSIS — Z794 Long term (current) use of insulin: Secondary | ICD-10-CM | POA: Insufficient documentation

## 2019-01-02 DIAGNOSIS — U071 COVID-19: Secondary | ICD-10-CM | POA: Insufficient documentation

## 2019-01-02 DIAGNOSIS — Z7982 Long term (current) use of aspirin: Secondary | ICD-10-CM | POA: Insufficient documentation

## 2019-01-02 DIAGNOSIS — Z7984 Long term (current) use of oral hypoglycemic drugs: Secondary | ICD-10-CM | POA: Insufficient documentation

## 2019-01-02 DIAGNOSIS — I10 Essential (primary) hypertension: Secondary | ICD-10-CM | POA: Insufficient documentation

## 2019-01-02 LAB — BASIC METABOLIC PANEL
Anion gap: 10 (ref 5–15)
BUN: 11 mg/dL (ref 6–20)
CO2: 23 mmol/L (ref 22–32)
Calcium: 8.9 mg/dL (ref 8.9–10.3)
Chloride: 103 mmol/L (ref 98–111)
Creatinine, Ser: 0.67 mg/dL (ref 0.44–1.00)
GFR calc Af Amer: >60 mL/min (ref >60)
GFR calc non Af Amer: >60 mL/min (ref >60)
Glucose, Bld: 209 mg/dL — ABNORMAL HIGH (ref 70–99)
Potassium: 4 mmol/L (ref 3.5–5.1)
Sodium: 136 mmol/L (ref 135–145)

## 2019-01-02 LAB — CBC
HCT: 38.6 % (ref 36.0–46.0)
Hemoglobin: 13 g/dL (ref 12.0–15.0)
MCH: 26.1 pg (ref 26.0–34.0)
MCHC: 33.7 g/dL (ref 30.0–36.0)
MCV: 77.5 fL — ABNORMAL LOW (ref 80.0–100.0)
Platelets: 267 10*3/uL (ref 150–400)
RBC: 4.98 MIL/uL (ref 3.87–5.11)
RDW: 13.7 % (ref 11.5–15.5)
WBC: 5.4 10*3/uL (ref 4.0–10.5)
nRBC: 0 % (ref 0.0–0.2)

## 2019-01-02 LAB — TROPONIN I (HIGH SENSITIVITY): Troponin I (High Sensitivity): 3 ng/L (ref <18)

## 2019-01-02 IMAGING — CR DG CHEST 2V
1 series · 3 of 3 positions shown · non-contrast
Comparison: [DATE]

CLINICAL DATA: COVID+ this week, increasing SOB and fever, no hx of
heart/lung disease, non smoker

EXAM:
CHEST - 2 VIEW

[Series 5: w chest pa · 0.14mm/px · 3 of 3 slices shown]
[im 1/3]
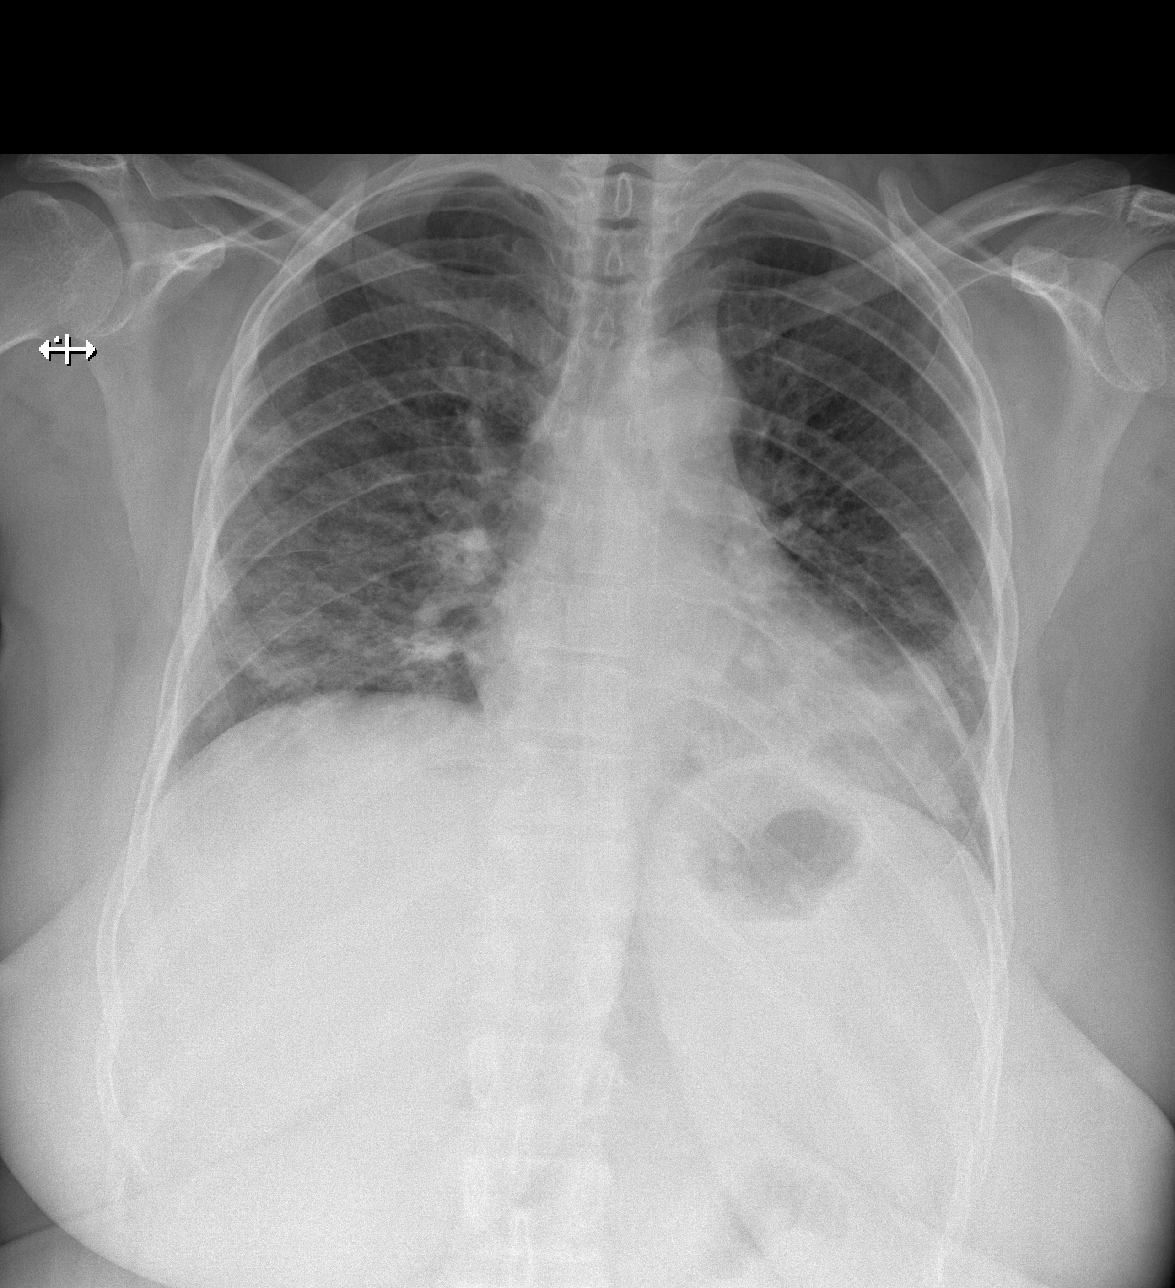
[im 2/3]
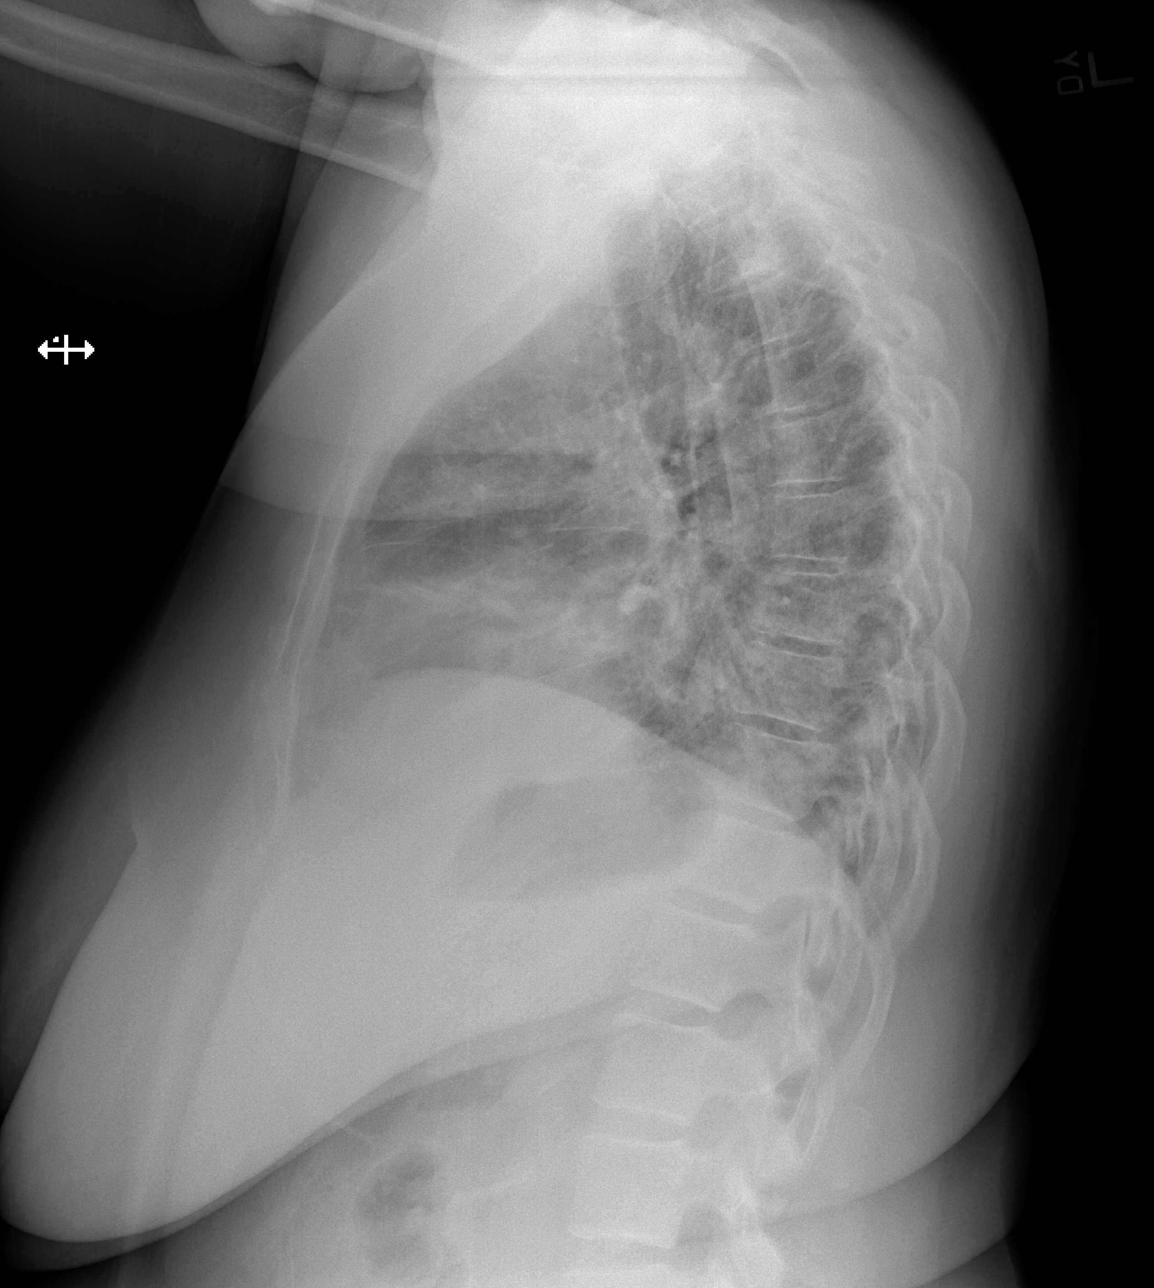
[im 3/3]
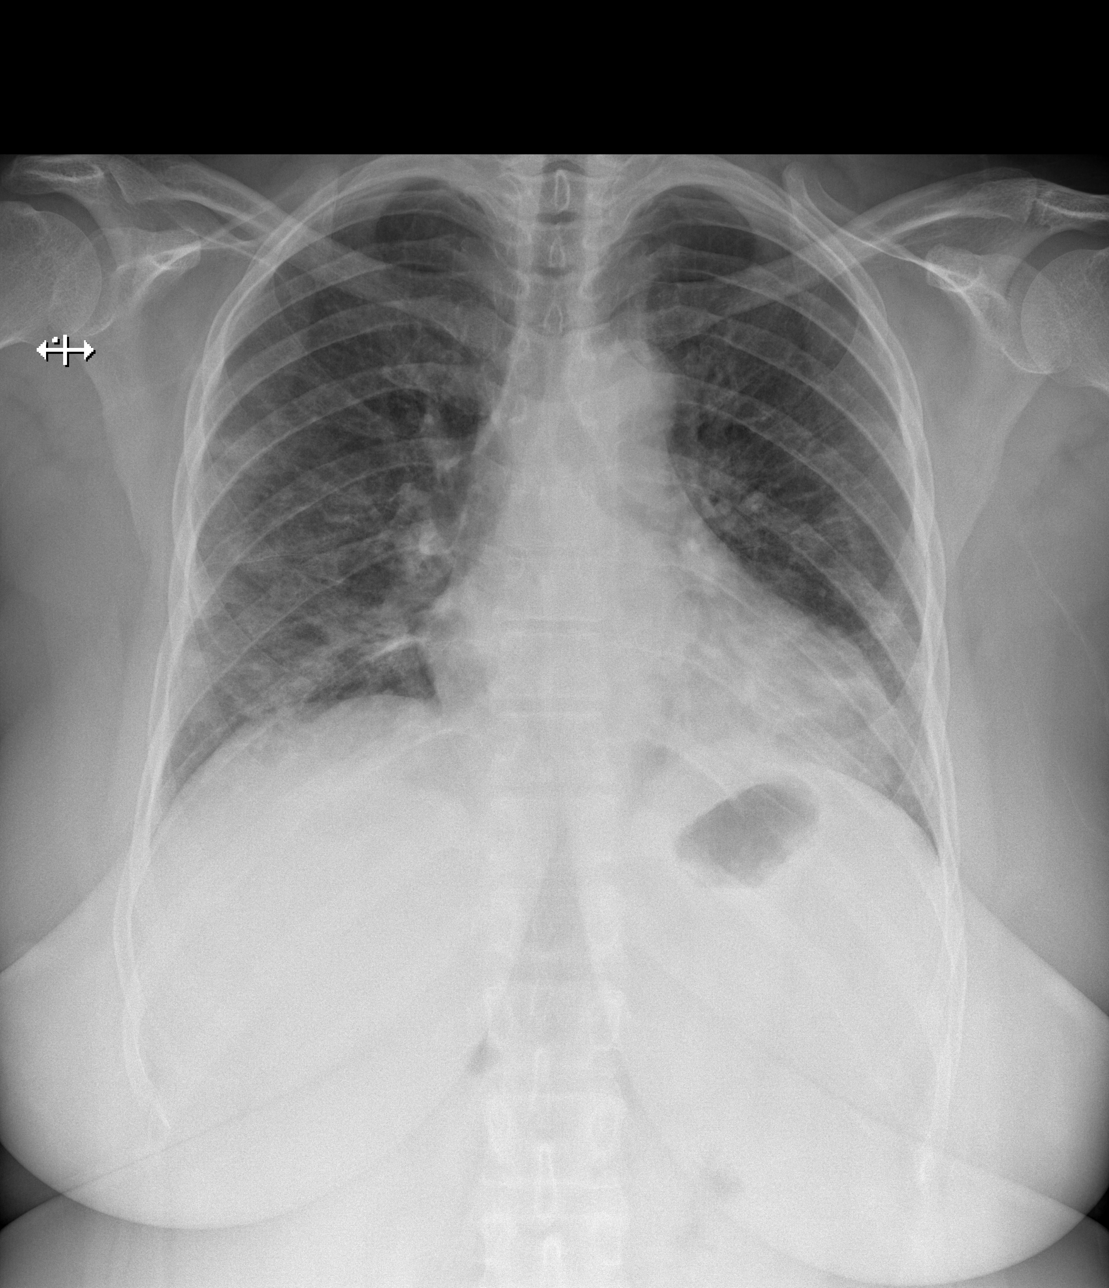

[3 of 3 positions shown; findings below may reference images not displayed]

FINDINGS: There are hazy bilateral airspace lung opacities, in the mid to
lower lungs. No evidence of pulmonary edema. No pleural effusion or
pneumothorax.

Cardiac silhouette is normal in size. No mediastinal or masses. No
evidence of adenopathy.

Skeletal structures are intact.
IMPRESSION: 1. Bilateral hazy airspace lung opacities in mid to lower lungs
consistent with multifocal pneumonia due to [IG].

## 2019-01-02 MED ORDER — IBUPROFEN 800 MG PO TABS
800.0000 mg | ORAL_TABLET | ORAL | Status: AC
  Administered 2019-01-02: 800 mg via ORAL
  Filled 2019-01-02: qty 1

## 2019-01-02 MED ORDER — SODIUM CHLORIDE 0.9% FLUSH
3.0000 mL | Freq: Once | INTRAVENOUS | Status: AC
  Administered 2019-01-02: 3 mL via INTRAVENOUS

## 2019-01-02 MED ORDER — SODIUM CHLORIDE 0.9 % IV BOLUS
1000.0000 mL | Freq: Once | INTRAVENOUS | Status: AC
  Administered 2019-01-02: 1000 mL via INTRAVENOUS

## 2019-01-02 MED ORDER — ONDANSETRON HCL 4 MG/2ML IJ SOLN
4.0000 mg | Freq: Once | INTRAMUSCULAR | Status: AC
  Administered 2019-01-02: 15:00:00 4 mg via INTRAVENOUS
  Filled 2019-01-02: qty 2

## 2019-01-02 MED ORDER — AZITHROMYCIN 500 MG PO TABS
500.0000 mg | ORAL_TABLET | Freq: Once | ORAL | Status: AC
  Administered 2019-01-02: 500 mg via ORAL
  Filled 2019-01-02: qty 1

## 2019-01-02 MED ORDER — ONDANSETRON 4 MG PO TBDP: 4.0000 mg | ORAL_TABLET | Freq: Four times a day (QID) | ORAL | 0 refills | Status: DC | PRN

## 2019-01-02 MED ORDER — ACETAMINOPHEN 500 MG PO TABS
1000.0000 mg | ORAL_TABLET | ORAL | Status: AC
  Administered 2019-01-02: 1000 mg via ORAL
  Filled 2019-01-02: qty 2

## 2019-01-02 MED ORDER — AZITHROMYCIN 250 MG PO TABS: ORAL_TABLET | ORAL | 0 refills | Status: DC

## 2019-01-02 NOTE — Discharge Instructions (Addendum)
Person Under Monitoring Name: Angelica [name]  Location: [place] [address]   Infection Prevention Recommendations for Individuals Confirmed to have, or Being Evaluated for, 2019 Novel Coronavirus (COVID-19) Infection Who Receive Care at Home  Individuals who are confirmed to have, or are being evaluated for, COVID-19 should follow the prevention steps below until a healthcare provider or local or state health department says they can return to normal activities.  Stay home except to get medical care You should restrict activities outside your home, except for getting medical care. Do not go to work, school, or public areas, and do not use public transportation or taxis.  Call ahead before visiting your doctor Before your medical appointment, call the healthcare provider and tell them that you have, or are being evaluated for, COVID-19 infection. This will help the healthcare provider's office take steps to keep other people from getting infected. Ask your healthcare provider to call the local or state health department.  Monitor your symptoms Seek prompt medical attention if your illness is worsening (e.g., difficulty breathing). Before going to your medical appointment, call the healthcare provider and tell them that you have, or are being evaluated for, COVID-19 infection. Ask your healthcare provider to call the local or state health department.  Wear a facemask You should wear a facemask that covers your nose and mouth when you are in the same room with other people and when you visit a healthcare provider. People who live with or visit you should also wear a facemask while they are in the same room with you.  Separate yourself from other people in your home As much as possible, you should stay in a different room from other people in your home. Also, you should use a separate bathroom, if available.  Avoid sharing household items You should not share  dishes, drinking glasses, cups, eating utensils, towels, bedding, or other items with other people in your home. After using these items, you should wash them thoroughly with soap and water.  Cover your coughs and sneezes Cover your mouth and nose with a tissue when you cough or sneeze, or you can cough or sneeze into your sleeve. Throw used tissues in a lined trash can, and immediately wash your hands with soap and water for at least 20 seconds or use an alcohol-based hand rub.  Wash your [facility] your hands often and thoroughly with soap and water for at least 20 seconds. You can use an alcohol-based hand sanitizer if soap and water are not available and if your hands are not visibly dirty. Avoid touching your eyes, nose, and mouth with unwashed hands.   Prevention Steps for Caregivers and Household Members of Individuals Confirmed to have, or Being Evaluated for, COVID-19 Infection Being Cared for in the Home  If you live with, or provide care at home for, a person confirmed to have, or being evaluated for, COVID-19 infection please follow these guidelines to prevent infection:  Follow healthcare provider's instructions Make sure that you understand and can help the patient follow any healthcare provider instructions for all care.  Provide for the patient's basic needs You should help the patient with basic needs in the home and provide support for getting groceries, prescriptions, and other personal needs.  Monitor the patient's symptoms If they are getting sicker, call his or her medical provider and tell them that the patient has, or is being evaluated for, COVID-19 infection. This will help the healthcare provider's office  take steps to keep other people from getting infected. Ask the healthcare provider to call the local or state health department.  Limit the number of people who have contact with the patient If possible, have only one caregiver for the patient. Other  household members should stay in another home or place of residence. If this is not possible, they should stay in another room, or be separated from the patient as much as possible. Use a separate bathroom, if available. Restrict visitors who do not have an essential need to be in the home.  Keep older adults, very young children, and other sick people away from the patient Keep older adults, very young children, and those who have compromised immune systems or chronic health conditions away from the patient. This includes people with chronic heart, lung, or kidney conditions, diabetes, and cancer.  Ensure good ventilation Make sure that shared spaces in the home have good air flow, such as from an air conditioner or an opened window, weather permitting.  Wash your hands often Wash your hands often and thoroughly with soap and water for at least 20 seconds. You can use an alcohol based hand sanitizer if soap and water are not available and if your hands are not visibly dirty. Avoid touching your eyes, nose, and mouth with unwashed hands. Use disposable paper towels to dry your hands. If not available, use dedicated cloth towels and replace them when they become wet.  Wear a facemask and gloves Wear a disposable facemask at all times in the room and gloves when you touch or have contact with the patient's blood, body fluids, and/or secretions or excretions, such as sweat, saliva, sputum, nasal mucus, vomit, urine, or feces.  Ensure the mask fits over your nose and mouth tightly, and do not touch it during use. Throw out disposable facemasks and gloves after using them. Do not reuse. Wash your hands immediately after removing your facemask and gloves. If your personal clothing becomes contaminated, carefully remove clothing and launder. Wash your hands after handling contaminated clothing. Place all used disposable facemasks, gloves, and other waste in a lined container before disposing them with  other household waste. Remove gloves and wash your hands immediately after handling these items.  Do not share dishes, glasses, or other household items with the patient Avoid sharing household items. You should not share dishes, drinking glasses, cups, eating utensils, towels, bedding, or other items with a patient who is confirmed to have, or being evaluated for, COVID-19 infection. After the person uses these items, you should wash them thoroughly with soap and water.  Wash laundry thoroughly Immediately remove and wash clothes or bedding that have blood, body fluids, and/or secretions or excretions, such as sweat, saliva, sputum, nasal mucus, vomit, urine, or feces, on them. Wear gloves when handling laundry from the patient. Read and follow directions on labels of laundry or clothing items and detergent. In general, wash and dry with the warmest temperatures recommended on the label.  Clean all areas the individual has used often Clean all touchable surfaces, such as counters, tabletops, doorknobs, bathroom fixtures, toilets, phones, keyboards, tablets, and bedside tables, every day. Also, clean any surfaces that may have blood, body fluids, and/or secretions or excretions on them. Wear gloves when cleaning surfaces the patient has come in contact with. Use a diluted bleach solution (e.g., dilute bleach with 1 part bleach and 10 parts water) or a household disinfectant with a label that says EPA-registered for coronaviruses. To make a bleach  solution at home, add 1 tablespoon of bleach to 1 quart (4 cups) of water. For a larger supply, add  cup of bleach to 1 gallon (16 cups) of water. Read labels of cleaning products and follow recommendations provided on product labels. Labels contain instructions for safe and effective use of the cleaning product including precautions you should take when applying the product, such as wearing gloves or eye protection and making sure you have good ventilation  during use of the product. Remove gloves and wash hands immediately after cleaning.  Monitor yourself for signs and symptoms of illness Caregivers and household members are considered close contacts, should monitor their health, and will be asked to limit movement outside of the home to the extent possible. Follow the monitoring steps for close contacts listed on the symptom monitoring form.   ? If you have additional questions, contact your local health department or call the epidemiologist on call at [phone] (available 24/7). ? This guidance is subject to change. For the most up-to-date guidance from [facility], please refer to their website: [email or web address]

## 2019-01-02 NOTE — ED Triage Notes (Signed)
Pt via pov from home with nausea, back pain, sob, dehydration. Diagnosed with covid on Tuesday. Pt states she feels worse than she has yet. Pt alert & oriented; nad noted.

## 2019-01-02 NOTE — ED Provider Notes (Signed)
3:52 PM Assumed care for off going team.   Blood pressure 113/66, pulse 90, temperature 99.5 F (37.5 C), temperature source Oral, resp. rate 18, height 5\' 3"  (1.6 m), weight 86.2 kg, last menstrual period 03/11/2013, SpO2 100 %.  See their HPI for full report but in brief +Covid 10 days, O2 sat normal with ambulation, feels dehydrated/weak. Zofran, Fluids. Plan to d/c after re-assessment.   Labs are reassuring.  4:09 PM reevaluated patient.  Patient feeling better with the fluids and nausea medicine.  Discussed with patient that she should return if develops worsening shortness of breath or unable to tolerate p.o.  Patient expressed understanding and felt comfortable with being discharged home at this time.  She understand that she will should remain quarantined at home.  I discussed the provisional nature of ED diagnosis, the treatment so far, the ongoing plan of care, follow up appointments and return precautions with the patient and any family or support people present. They expressed understanding and agreed with the plan, discharged home.           Vanessa Minneola, MD 01/02/19 [phone]

## 2019-01-02 NOTE — ED Provider Notes (Signed)
[facility] Emergency Department Provider Note   ____________________________________________   First MD Initiated Contact with Patient 01/02/19 1418     (approximate)  I have reviewed the triage vital signs and the nursing notes.   HISTORY  Chief Complaint Nausea, Shortness of Breath, Constipation, and Back Pain    HPI PALESTINE GOVAN is a 58 y.o. female here for evaluation of nausea weakness and muscle aches some shortness of breath  Patient reports she was diagnosed with Covid about 5 days ago, she is in total about 10 days now had symptoms including cough body aches fevers chills  She is continue to feel sweaty, nauseated decreased appetite.  Starting to feel dehydrated.  Also reports her mother has Covid  No chest pain.  She developed shortness of breath a little bit at rest, but a lot when trying to exert.  No abdominal pain but having nausea and decreased appetite   Past Medical History:  Diagnosis Date  . Bell palsy 08/04/2014  . GERD (gastroesophageal reflux disease) 01/27/2015  . History of Bell's palsy June 2016  . Hyperlipidemia 01/27/2015  . Hypertension 01/27/2015  . Type 2 diabetes mellitus ([place]) 08/04/2014    Patient Active Problem List   Diagnosis Date Noted  . Headache 10/20/2018  . Insomnia 10/20/2018  . Anxiety 10/20/2018  . Dysuria 10/20/2018  . Encounter for screening colonoscopy   . Residual hemorrhoidal skin tags   . Internal hemorrhoids   . Polyp of ascending colon   . 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 Healthcare maintenance 09/23/2018  . Menopausal hot flushes 07/20/2015  . GERD (gastroesophageal reflux disease) 01/27/2015  . Hypertension 01/27/2015  . Hyperlipidemia 01/27/2015  . Bell palsy 08/04/2014  . Type 2 diabetes mellitus ([place]) 08/04/2014    Past Surgical History:  Procedure Laterality Date  . COLONOSCOPY WITH PROPOFOL N/A 10/07/2018   Procedure: COLONOSCOPY WITH PROPOFOL;  Surgeon: Virgel Manifold, MD;   Location: [facility] ENDOSCOPY;  Service: Gastroenterology;  Laterality: N/A;  . OOPHORECTOMY Right 2005    Prior to Admission medications   Medication Sig Start Date End Date Taking? Authorizing Provider  aspirin EC 81 MG tablet Take 1 tablet (81 mg total) by mouth daily. 12/25/16   Tawni Millers, MD  atorvastatin (LIPITOR) 40 MG tablet Take 1 tablet (40 mg total) by mouth daily at 6 PM. 12/25/16   Tawni Millers, MD  azithromycin (ZITHROMAX) 250 MG tablet Take 1 tablet daily by mouth 01/02/19   Delman Kitten, MD  bisacodyl (BISACODYL) 5 MG EC tablet Please  Take 2 tablets (10mg ) the day before your procedure between 1pm and 3pm Patient not taking: Reported on 12/15/2018 10/07/18   Virgel Manifold, MD  hydrochlorothiazide (HYDRODIURIL) 12.5 MG tablet Take 1 tablet (12.5 mg total) by mouth 2 (two) times daily. 10/07/17 12/15/19  Gwyndolyn Saxon, FNP  Insulin Glargine (LANTUS SOLOSTAR) 100 UNIT/ML Solostar Pen Inject 36 Units into the skin daily at 10 pm. Increase by 4 units every 3 days if fasting blood glucose is > 180 mg/dl. Patient taking differently: Inject 40 Units into the skin daily at 10 pm.  10/20/18   Iloabachie, Chioma E, NP  metFORMIN (GLUCOPHAGE-XR) 500 MG 24 hr tablet Take 2 tablets (1,000 mg total) by mouth daily with breakfast AND 1 tablet (500 mg total) daily before supper. 10/07/17 12/15/19  Gwyndolyn Saxon, FNP  omeprazole (PRILOSEC) 20 MG capsule Take 1 capsule (20 mg total) by mouth daily. 10/07/17   Gwyndolyn Saxon,  FNP  ondansetron (ZOFRAN ODT) 4 MG disintegrating tablet Take 1 tablet (4 mg total) by mouth every 6 (six) hours as needed for nausea or vomiting. 01/02/19   Delman Kitten, MD  Semaglutide (OZEMPIC) 0.25 or 0.5 MG/DOSE SOPN Inject 0.5 mg into the skin once a week.    [provider]    Allergies Patient has no known allergies.  Family History  Problem Relation Age of Onset  . Cancer Brother        Colon Cancer  . Breast cancer Neg Hx      Social History Social History   Tobacco Use  . Smoking status: Never Smoker  . Smokeless tobacco: Never Used  Substance Use Topics  . Alcohol use: No  . Drug use: No    Review of Systems Constitutional: Fevers and chills has not taken any medications for relief Eyes: No visual changes. ENT: No sore throat.  Feels little dry Cardiovascular: Denies chest pain. Respiratory: See HPI Gastrointestinal: No abdominal pain.  Nausea but no vomiting Genitourinary: Negative for dysuria. Musculoskeletal: Negative for back pain. Skin: Negative for rash. Neurological: Negative for  focal weakness or numbness.    ____________________________________________   PHYSICAL EXAM:  VITAL SIGNS: ED Triage Vitals  Enc Vitals Group     BP 01/02/19 1253 119/82     Pulse Rate 01/02/19 1253 (!) 105     Resp 01/02/19 1253 18     Temp 01/02/19 1253 99.5 F (37.5 C)     Temp Source 01/02/19 1253 Oral     SpO2 01/02/19 1253 95 %     Weight 01/02/19 1257 190 lb (86.2 kg)     Height 01/02/19 1257 5\' 3"  (1.6 m)     Head Circumference --      Peak Flow --      Pain Score 01/02/19 1256 10     Pain Loc --      Pain Edu? --      Excl. in [place]? --     Constitutional: Alert and oriented.  Mildly ill-appearing but in no acute distress. Eyes: Conjunctivae are normal. Head: Atraumatic. Nose: No congestion/rhinnorhea. Neck: No stridor.  Cardiovascular: Normal rate, regular rhythm. Grossly normal heart sounds.  Good peripheral circulation. Respiratory: Normal respiratory effort.  No retractions. Lungs CTAB. Gastrointestinal: Soft and nontender. No distention. Musculoskeletal: No lower extremity tenderness nor edema. Neurologic:  Normal speech and language. No gross focal neurologic deficits are appreciated.  Skin:  Skin is warm, dry and intact. No rash noted. Psychiatric: Mood and affect are normal. Speech and behavior are normal.  Overall reassuring clinical exam without evidence of increased  work of breathing.  Speaks in full and clear sentences.  Normal oxygen saturation on room air, however does appear overall mildly ill, dehydrated and fatigued. ____________________________________________   LABS (all labs ordered are listed, but only abnormal results are displayed)  Labs Reviewed  BASIC METABOLIC PANEL - Abnormal; Notable for the following components:      Result Value   Glucose, Bld 209 (*)    All other components within normal limits  CBC - Abnormal; Notable for the following components:   MCV 77.5 (*)    All other components within normal limits  TROPONIN I (HIGH SENSITIVITY)  TROPONIN I (HIGH SENSITIVITY)   ____________________________________________  EKG   ____________________________________________  RADIOLOGY  DG Chest 2 View  Result Date: 01/02/2019 CLINICAL DATA:  COVID+ this week, increasing SOB and fever, no hx of heart/lung disease, non smoker EXAM: CHEST -  2 VIEW COMPARISON:  09/18/2015 FINDINGS: There are hazy bilateral airspace lung opacities, in the mid to lower lungs. No evidence of pulmonary edema. No pleural effusion or pneumothorax. Cardiac silhouette is normal in size. No mediastinal or masses. No evidence of adenopathy. Skeletal structures are intact. IMPRESSION: 1. Bilateral hazy airspace lung opacities in mid to lower lungs consistent with multifocal pneumonia due to COVID-19. Electronically Signed   By: Lajean Manes M.D.   On: 01/02/2019 13:51    Image reviewed by me, bilateral airspace opacities ____________________________________________   PROCEDURES  Procedure(s) performed: None  Procedures  Critical Care performed: No  ____________________________________________   INITIAL IMPRESSION / ASSESSMENT AND PLAN / ED COURSE  Pertinent labs & imaging results that were available during my care of the patient were reviewed by me and considered in my medical decision making (see chart for details).   Patient presents today for  feeling dehydrated, weak and fatigued with known positive Covid test confirmed.  She does appear generally ill, viral-like symptomatology fits her diagnosis.  She has been symptomatic for about 10 days.  Bilateral infiltrates on x-ray are not surprising.  She is however not hypoxic on room air, will trial ambulatory pulse ox which registered low of 92-96 with ambulating.  In general, will hydrate, provide antipyretics, antiemetics.  Discussed potential use of azithromycin with the patient, patient comfortable with plan for treatment with this as well.  Ongoing care and disposition including follow-up on reassessment after ER interventions assigned to my partner Dr. Nickolas Madrid At 0330p       ____________________________________________   FINAL CLINICAL IMPRESSION(S) / ED DIAGNOSES  Final diagnoses:  COVID-19 virus infection        Note:  This document was prepared using Dragon voice recognition software and may include unintentional dictation errors       Delman Kitten, MD 01/02/19 1541

## 2019-01-02 NOTE — ED Notes (Addendum)
Pt ambulated around the room without assistance. Pt states she feels SOB when she walks and resting. While ambulating O2 maintained 92%-96% on RA. Pt is now laying in ED stretcher and O2 are 96%

## 2019-01-06 ENCOUNTER — Ambulatory Visit: Payer: Self-pay | Admitting: Internal Medicine

## 2019-01-20 ENCOUNTER — Other Ambulatory Visit: Payer: Self-pay

## 2019-01-20 DIAGNOSIS — I10 Essential (primary) hypertension: Secondary | ICD-10-CM

## 2019-01-22 LAB — HEMOGLOBIN A1C
Est. average glucose Bld gHb Est-mCnc: 266 mg/dL
Hgb A1c MFr Bld: 10.9 % — ABNORMAL HIGH (ref 4.8–5.6)

## 2019-01-22 LAB — MICROSCOPIC EXAMINATION
Casts: NONE SEEN /lpf
Epithelial Cells (non renal): >10 /hpf — AB (ref 0–10)

## 2019-01-22 LAB — CBC
Hematocrit: 39.6 % (ref 34.0–46.6)
Hemoglobin: 13.1 g/dL (ref 11.1–15.9)
MCH: 26.7 pg (ref 26.6–33.0)
MCHC: 33.1 g/dL (ref 31.5–35.7)
MCV: 81 fL (ref 79–97)
Platelets: 342 10*3/uL (ref 150–450)
RBC: 4.91 x10E6/uL (ref 3.77–5.28)
RDW: 13.9 % (ref 11.7–15.4)
WBC: 7.1 10*3/uL (ref 3.4–10.8)

## 2019-01-22 LAB — COMPREHENSIVE METABOLIC PANEL
ALT: 24 IU/L (ref 0–32)
AST: 14 IU/L (ref 0–40)
Albumin/Globulin Ratio: 1.1 — ABNORMAL LOW (ref 1.2–2.2)
Albumin: 4.1 g/dL (ref 3.8–4.9)
Alkaline Phosphatase: 151 IU/L — ABNORMAL HIGH (ref 39–117)
BUN/Creatinine Ratio: 16 (ref 9–23)
BUN: 10 mg/dL (ref 6–24)
Bilirubin Total: 0.4 mg/dL (ref 0.0–1.2)
CO2: 25 mmol/L (ref 20–29)
Calcium: 9.9 mg/dL (ref 8.7–10.2)
Chloride: 100 mmol/L (ref 96–106)
Creatinine, Ser: 0.61 mg/dL (ref 0.57–1.00)
GFR calc Af Amer: 117 mL/min/{1.73_m2} (ref >59)
GFR calc non Af Amer: 102 mL/min/{1.73_m2} (ref >59)
Globulin, Total: 3.7 g/dL (ref 1.5–4.5)
Glucose: 268 mg/dL — ABNORMAL HIGH (ref 65–99)
Potassium: 4 mmol/L (ref 3.5–5.2)
Sodium: 139 mmol/L (ref 134–144)
Total Protein: 7.8 g/dL (ref 6.0–8.5)

## 2019-01-22 LAB — URINE CULTURE, REFLEX

## 2019-01-22 LAB — UA/M W/RFLX CULTURE, ROUTINE
Bilirubin, UA: NEGATIVE
Ketones, UA: NEGATIVE
Leukocytes,UA: NEGATIVE
Nitrite, UA: NEGATIVE
RBC, UA: NEGATIVE
Specific Gravity, UA: >=1.03 — AB (ref 1.005–1.030)
Urobilinogen, Ur: 0.2 mg/dL (ref 0.2–1.0)
pH, UA: 5 (ref 5.0–7.5)

## 2019-01-27 ENCOUNTER — Ambulatory Visit: Payer: Self-pay | Admitting: Internal Medicine

## 2019-01-27 ENCOUNTER — Other Ambulatory Visit: Payer: Self-pay

## 2019-01-27 VITALS — BP 131/89 | HR 82 | Temp 96.7°F | Ht 63.0 in | Wt 188.1 lb

## 2019-01-27 DIAGNOSIS — E119 Type 2 diabetes mellitus without complications: Secondary | ICD-10-CM

## 2019-01-27 DIAGNOSIS — I10 Essential (primary) hypertension: Secondary | ICD-10-CM

## 2019-01-27 DIAGNOSIS — K219 Gastro-esophageal reflux disease without esophagitis: Secondary | ICD-10-CM

## 2019-01-27 DIAGNOSIS — U071 COVID-19: Secondary | ICD-10-CM

## 2019-01-27 DIAGNOSIS — K635 Polyp of colon: Secondary | ICD-10-CM

## 2019-01-27 MED ORDER — METFORMIN HCL ER 500 MG PO TB24: ORAL_TABLET | ORAL | 3 refills | Status: DC

## 2019-01-27 NOTE — Progress Notes (Signed)
Subjective:    Patient ID: Angelica [name], female    DOB: Jun 06, 1960, 59 y.o.   MRN: [id number]  HPI  Patient is a 59 year old female presenting for a telephonic visit. Patient reports that she tested positive for COVID-19 in early December. Patient reports she lost sense of taste and smell. She reports that ER staff said she had a mild pneumonia but that it was not severe enough for hospital admission. She is now feeling better. She continues to take care of her mother who also tested positive and lives with her at home.   Review of Systems Patient Active Problem List   Diagnosis Date Noted  . Headache 10/20/2018  . Insomnia 10/20/2018  . Anxiety 10/20/2018  . Dysuria 10/20/2018  . Encounter for screening colonoscopy   . Residual hemorrhoidal skin tags   . Internal hemorrhoids   . Polyp of ascending colon   . 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 Healthcare maintenance 09/23/2018  . Menopausal hot flushes 07/20/2015  . GERD (gastroesophageal reflux disease) 01/27/2015  . Hypertension 01/27/2015  . Hyperlipidemia 01/27/2015  . Bell palsy 08/04/2014  . Type 2 diabetes mellitus ([place]) 08/04/2014   Allergies as of 01/27/2019   No Known Allergies     Medication List       Accurate as of January 27, 2019 10:08 AM. If you have any questions, ask your nurse or doctor.        aspirin EC 81 MG tablet Take 1 tablet (81 mg total) by mouth daily.   atorvastatin 40 MG tablet Commonly known as: LIPITOR Take 1 tablet (40 mg total) by mouth daily at 6 PM.   azithromycin 250 MG tablet Commonly known as: Zithromax Take 1 tablet daily by mouth   bisacodyl 5 MG EC tablet Generic drug: bisacodyl Please  Take 2 tablets (65m) the day before your procedure between 1pm and 3pm   hydrochlorothiazide 12.5 MG tablet Commonly known as: HYDRODIURIL Take 1 tablet (12.5 mg total) by mouth 2 (two) times daily.   Lantus SoloStar 100 UNIT/ML Solostar Pen Generic drug: Insulin Glargine Inject 36 Units  into the skin daily at 10 pm. Increase by 4 units every 3 days if fasting blood glucose is > 180 mg/dl. What changed:   how much to take  additional instructions   metFORMIN 500 MG 24 hr tablet Commonly known as: GLUCOPHAGE-XR Take 2 tablets (1,000 mg total) by mouth daily with breakfast AND 1 tablet (500 mg total) daily before supper.   omeprazole 20 MG capsule Commonly known as: PRILOSEC Take 1 capsule (20 mg total) by mouth daily.   ondansetron 4 MG disintegrating tablet Commonly known as: Zofran ODT Take 1 tablet (4 mg total) by mouth every 6 (six) hours as needed for nausea or vomiting.   Ozempic (0.25 or 0.5 MG/DOSE) 2 MG/1.5ML Sopn Generic drug: Semaglutide(0.25 or 0.5MG/DOS) Inject 0.5 mg into the skin once a week.          Objective:   Physical Exam  No PE - telephonic visit      Assessment & Plan:  1. COVID-19 Mild symptoms in December appear to have cleared without any recognized sequela.   2. Diabetes mellitus without complication (HCC) Pt has not been checking her sugars nor following her diet well. Pt has been involved in the care of her mother aged 91with COVID-19. Plan to increase her metformmin to 10022mwith breakfast and 100069mith supper. Encouraged pt to check her blood sugar more frequently.  Has known FU appt with endocrinologist in a few weeks Labs: - metFORMIN (GLUCOPHAGE-XR) 500 MG 24 hr tablet; Take 2 tablets (1,000 mg total) by mouth daily with breakfast AND 2 tablets (1,000 mg total) daily before supper.  Dispense: 270 tablet; Refill: 3 - Comp Met (CMET); Future - CBC; Future - HgB A1c; Future - TSH; Future - UA/M w/rflx Culture, Routine; Future  3. Essential hypertension Appears to be stable at 131/89.   4. Polyp of ascending colon, unspecified type Previous history of polyp. Scheduled colonoscopy in December was postponed due to her COVID diagnosis. She is supposed to hear from her gastroenterologist for this appt to be rescheduled.     5. Gastroesophageal reflux disease, unspecified whether esophagitis present GERD with previous stricture was determined to not need additional intervention by gastroenterologist in December.   FU in 90 days with labs a week prior.

## 2019-03-09 ENCOUNTER — Ambulatory Visit: Payer: Medicaid Other

## 2019-03-09 ENCOUNTER — Other Ambulatory Visit: Payer: Self-pay

## 2019-03-18 ENCOUNTER — Telehealth: Payer: Self-pay | Admitting: Pharmacist

## 2019-03-18 NOTE — Telephone Encounter (Signed)
03/18/2019 12:14:50 PM - Lantus Solostar refill to provider -- Elmer Picker - Thursday, March 18, 2019 12:13 PM -- Taking [employer] refill request to [facility] for [place] to sign for Lantus Solostar Pen Max daily dose 36 units daily @ 10PM #3.

## 2019-04-05 ENCOUNTER — Telehealth: Payer: Self-pay | Admitting: Pharmacist

## 2019-04-05 NOTE — Telephone Encounter (Signed)
04/05/2019 12:22:14 PM - Ozempic renewal faxed to [facility] - Monday, April 05, 2019 12:21 PM --I have faxed renewal application to [employer] for [employer] 0.5mg  once weekly, #5 boxes.

## 2019-04-13 ENCOUNTER — Telehealth: Payer: Self-pay | Admitting: Pharmacist

## 2019-04-13 NOTE — Telephone Encounter (Signed)
04/13/2019 3:58:33 PM - Lantus Solostar refill to [facility] - Tuesday, April 13, 2019 3:57 PM --Faxed [employer] refill for Lantus Solostar Max 36 units daily at 10PM  # 3.

## 2019-04-22 ENCOUNTER — Ambulatory Visit: Payer: Medicaid Other | Admitting: Gerontology

## 2019-04-28 ENCOUNTER — Other Ambulatory Visit: Payer: Medicaid Other

## 2019-04-28 ENCOUNTER — Other Ambulatory Visit: Payer: Self-pay

## 2019-04-28 DIAGNOSIS — E119 Type 2 diabetes mellitus without complications: Secondary | ICD-10-CM

## 2019-05-03 LAB — TSH: TSH: 1.44 u[IU]/mL (ref 0.450–4.500)

## 2019-05-03 LAB — CBC
Hematocrit: 38.3 % (ref 34.0–46.6)
Hemoglobin: 13 g/dL (ref 11.1–15.9)
MCH: 27.1 pg (ref 26.6–33.0)
MCHC: 33.9 g/dL (ref 31.5–35.7)
MCV: 80 fL (ref 79–97)
Platelets: 372 10*3/uL (ref 150–450)
RBC: 4.8 x10E6/uL (ref 3.77–5.28)
RDW: 13.9 % (ref 11.7–15.4)
WBC: 7.5 10*3/uL (ref 3.4–10.8)

## 2019-05-03 LAB — UA/M W/RFLX CULTURE, ROUTINE

## 2019-05-03 LAB — COMPREHENSIVE METABOLIC PANEL
ALT: 22 IU/L (ref 0–32)
AST: 16 IU/L (ref 0–40)
Albumin/Globulin Ratio: 1.3 (ref 1.2–2.2)
Albumin: 4.2 g/dL (ref 3.8–4.9)
Alkaline Phosphatase: 148 IU/L — ABNORMAL HIGH (ref 39–117)
BUN/Creatinine Ratio: 19 (ref 9–23)
BUN: 13 mg/dL (ref 6–24)
Bilirubin Total: <0.2 mg/dL (ref 0.0–1.2)
CO2: 22 mmol/L (ref 20–29)
Calcium: 9.5 mg/dL (ref 8.7–10.2)
Chloride: 105 mmol/L (ref 96–106)
Creatinine, Ser: 0.67 mg/dL (ref 0.57–1.00)
GFR calc Af Amer: 114 mL/min/{1.73_m2} (ref >59)
GFR calc non Af Amer: 99 mL/min/{1.73_m2} (ref >59)
Globulin, Total: 3.3 g/dL (ref 1.5–4.5)
Glucose: 154 mg/dL — ABNORMAL HIGH (ref 65–99)
Potassium: 4 mmol/L (ref 3.5–5.2)
Sodium: 139 mmol/L (ref 134–144)
Total Protein: 7.5 g/dL (ref 6.0–8.5)

## 2019-05-03 LAB — HEMOGLOBIN A1C
Est. average glucose Bld gHb Est-mCnc: 209 mg/dL
Hgb A1c MFr Bld: 8.9 % — ABNORMAL HIGH (ref 4.8–5.6)

## 2019-05-05 ENCOUNTER — Other Ambulatory Visit: Payer: Self-pay

## 2019-05-05 ENCOUNTER — Encounter: Payer: Self-pay | Admitting: Gerontology

## 2019-05-05 ENCOUNTER — Ambulatory Visit: Payer: Self-pay | Admitting: Internal Medicine

## 2019-05-05 ENCOUNTER — Ambulatory Visit: Payer: Medicaid Other | Admitting: Gerontology

## 2019-05-05 VITALS — BP 123/85 | HR 90 | Temp 98.4°F | Ht 63.0 in | Wt 195.3 lb

## 2019-05-05 DIAGNOSIS — E1165 Type 2 diabetes mellitus with hyperglycemia: Secondary | ICD-10-CM

## 2019-05-05 NOTE — Progress Notes (Signed)
Established Patient Office Visit  Subjective:  Patient ID: Angelica [name], female    DOB: [birth year]  Age: 59 y.o. MRN: [id number]  CC:  Chief Complaint  Patient presents with  . Follow-up    HPI Angelica [name] presents for follow up of T2DM, and lab review. Her HgbA1c done 04/28/2019 decreased from 10.9% to 8.9%, she checks her blood glucose at least 3 times a week and it's usually less than 200 mgdl, and  she's compliant with her medications, adheres to ADA diet and exercises as tolerated.  Her blood glucose was checked during visit and it was 99 mg per DL.  She denies peripheral neuropathy, and performs daily foot check.  She complains of blurry vision and request ophthalmology appointment.  Overall she states that she is doing well and offers no further complaint.  Past Medical History:  Diagnosis Date  . Bell palsy 08/04/2014  . GERD (gastroesophageal reflux disease) 01/27/2015  . History of Bell's palsy June 2016  . Hyperlipidemia 01/27/2015  . Hypertension 01/27/2015  . Type 2 diabetes mellitus ([place]) 08/04/2014    Past Surgical History:  Procedure Laterality Date  . COLONOSCOPY WITH PROPOFOL N/A 10/07/2018   Procedure: COLONOSCOPY WITH PROPOFOL;  Surgeon: Virgel Manifold, MD;  Location: [facility] ENDOSCOPY;  Service: Gastroenterology;  Laterality: N/A;  . OOPHORECTOMY Right 2005    Family History  Problem Relation Age of Onset  . Cancer Brother        Colon Cancer  . Breast cancer Neg Hx     Social History   Socioeconomic History  . Marital status: Single    Spouse name: Not on file  . Number of children: 2  . Years of education: Not on file  . Highest education level: 11th grade  Occupational History  . Not on file  Tobacco Use  . Smoking status: Never Smoker  . Smokeless tobacco: Never Used  Substance and Sexual Activity  . Alcohol use: No  . Drug use: No  . Sexual activity: Not on file  Other Topics Concern  . 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 Not employed      On Food stamps   Social Determinants of Health   Financial Resource Strain:   . Difficulty of Paying Living Expenses:   Food Insecurity:   . Worried About Charity fundraiser in the Last Year:   . Arboriculturist in the Last Year:   Transportation Needs:   . Film/video editor (Medical):   Marland Kitchen Lack of Transportation (Non-Medical):   Physical Activity:   . Days of Exercise per Week:   . Minutes of Exercise per Session:   Stress:   . Feeling of Stress :   Social Connections:   . Frequency of Communication with Friends and Family:   . Frequency of Social Gatherings with Friends and Family:   . Attends Religious Services:   . Active Member of Clubs or Organizations:   . Attends Archivist Meetings:   Marland Kitchen Marital Status:   Intimate Partner Violence:   . Fear of Current or Ex-Partner:   . Emotionally Abused:   Marland Kitchen Physically Abused:   . Sexually Abused:     Outpatient Medications Prior to Visit  Medication Sig Dispense Refill  . aspirin EC 81 MG tablet Take 1 tablet (81 mg total) by mouth daily. 90 tablet 3  . atorvastatin (LIPITOR)  40 MG tablet Take 1 tablet (40 mg total) by mouth daily at 6 PM. 90 tablet 3  . hydrochlorothiazide (HYDRODIURIL) 12.5 MG tablet Take 1 tablet (12.5 mg total) by mouth 2 (two) times daily. 90 tablet 3  . Insulin Glargine (LANTUS SOLOSTAR) 100 UNIT/ML Solostar Pen Inject 36 Units into the skin daily at 10 pm. Increase by 4 units every 3 days if fasting blood glucose is > 180 mg/dl. (Patient taking differently: Inject 40 Units into the skin daily at 10 pm. ) 30 mL 3  . metFORMIN (GLUCOPHAGE-XR) 500 MG 24 hr tablet Take 2 tablets (1,000 mg total) by mouth daily with breakfast AND 2 tablets (1,000 mg total) daily before supper. 270 tablet 3  . omeprazole (PRILOSEC) 20 MG capsule Take 1 capsule (20 mg total) by mouth daily. 90 capsule 3  . Semaglutide (OZEMPIC) 0.25 or 0.5  MG/DOSE SOPN Inject 0.5 mg into the skin once a week.    Marland Kitchen azithromycin (ZITHROMAX) 250 MG tablet Take 1 tablet daily by mouth (Patient not taking: Reported on 05/05/2019) 4 tablet 0  . bisacodyl (BISACODYL) 5 MG EC tablet Please  Take 2 tablets (10mg ) the day before your procedure between 1pm and 3pm (Patient not taking: Reported on 12/15/2018) 2 tablet 0  . ondansetron (ZOFRAN ODT) 4 MG disintegrating tablet Take 1 tablet (4 mg total) by mouth every 6 (six) hours as needed for nausea or vomiting. (Patient not taking: Reported on 05/05/2019) 20 tablet 0   No facility-administered medications prior to visit.    No Known Allergies  ROS Review of Systems  Eyes: Positive for visual disturbance (.blurry vision).  Respiratory: Negative.   Cardiovascular: Negative.   Endocrine: Negative.   Genitourinary: Negative.   Neurological: Negative.   Psychiatric/Behavioral: Negative.       Objective:    Physical Exam  Constitutional: She is oriented to person, place, and time. She appears well-developed.  HENT:  Head: Normocephalic and atraumatic.  Eyes: Pupils are equal, round, and reactive to light. EOM are normal.  Cardiovascular: Normal rate and regular rhythm.  Pulmonary/Chest: Effort normal and breath sounds normal.  Neurological: She is alert and oriented to person, place, and time.  Psychiatric: She has a normal mood and affect. Her behavior is normal. Judgment and thought content normal.    BP 123/85 (BP Location: Left Arm, Patient Position: Sitting, Cuff Size: Large)   Pulse 90   Temp 98.4 F (36.9 C)   Ht 5\' 3"  (1.6 m)   Wt 195 lb 4.8 oz (88.6 kg)   LMP 03/11/2013 (Within Months)   SpO2 94%   BMI 34.60 kg/m  Wt Readings from Last 3 Encounters:  05/05/19 195 lb 4.8 oz (88.6 kg)  04/28/19 192 lb 1.6 oz (87.1 kg)  01/27/19 188 lb 1.6 oz (85.3 kg)     Health Maintenance Due  Topic Date Due  . TETANUS/TDAP  Never done  . OPHTHALMOLOGY EXAM  12/27/2011    There are no  preventive care reminders to display for this patient.  Lab Results  Component Value Date   TSH 1.440 04/28/2019   Lab Results  Component Value Date   WBC 7.5 04/28/2019   HGB 13.0 04/28/2019   HCT 38.3 04/28/2019   MCV 80 04/28/2019   PLT 372 04/28/2019   Lab Results  Component Value Date   NA 139 04/28/2019   K 4.0 04/28/2019   CO2 22 04/28/2019   GLUCOSE 154 (H) 04/28/2019   BUN 13 04/28/2019  CREATININE 0.67 04/28/2019   BILITOT <0.2 04/28/2019   ALKPHOS 148 (H) 04/28/2019   AST 16 04/28/2019   ALT 22 04/28/2019   PROT 7.5 04/28/2019   ALBUMIN 4.2 04/28/2019   CALCIUM 9.5 04/28/2019   ANIONGAP 10 01/02/2019   Lab Results  Component Value Date   CHOL 175 09/30/2018   Lab Results  Component Value Date   HDL 39 (L) 09/30/2018   Lab Results  Component Value Date   LDLCALC 112 (H) 09/30/2018   Lab Results  Component Value Date   TRIG 132 09/30/2018   Lab Results  Component Value Date   CHOLHDL 4.5 (H) 09/30/2018   Lab Results  Component Value Date   HGBA1C 8.9 (H) 04/28/2019      Assessment & Plan:   1. Type 2 diabetes mellitus with hyperglycemia, with long-term current use of insulin (HCC) -Her hemoglobin A1c was 8.9%, and her goal is less than 7%.  She was advised to continue on low carbohydrate/no concentrated sweet diet and exercise as tolerated.  She was advised to check her fasting blood glucose daily, record and bring log to follow up appointment. Her goal should be between 80 and 130 mg per DL. -She was advised to complete financial application for ambulatory referral to . - UA/M w/rflx Culture, Routine; Future - HgB A1c; Future - UA/M w/rflx Culture, Routine     Follow-up: Return in about 3 months (around 08/04/2019), or if symptoms worsen or fail to improve.    Johann Santone Jerold Coombe, NP

## 2019-05-05 NOTE — Patient Instructions (Signed)
Carbohydrate Counting for Diabetes Mellitus, Adult  Carbohydrate counting is a method of keeping track of how many carbohydrates you eat. Eating carbohydrates naturally increases the amount of sugar (glucose) in the blood. Counting how many carbohydrates you eat helps keep your blood glucose within normal limits, which helps you manage your diabetes (diabetes mellitus). It is important to know how many carbohydrates you can safely have in each meal. This is different for every person. A diet and nutrition specialist (registered dietitian) can help you make a meal plan and calculate how many carbohydrates you should have at each meal and snack. Carbohydrates are found in the following foods:  Grains, such as breads and cereals.  Dried beans and soy products.  Starchy vegetables, such as potatoes, peas, and corn.  Fruit and fruit juices.  Milk and yogurt.  Sweets and snack foods, such as cake, cookies, candy, chips, and soft drinks. How do I count carbohydrates? There are two ways to count carbohydrates in food. You can use either of the methods or a combination of both. Reading "Nutrition Facts" on packaged food The "Nutrition Facts" list is included on the labels of almost all packaged foods and beverages in the [place] It includes:  The serving size.  Information about nutrients in each serving, including the grams (g) of carbohydrate per serving. To use the "Nutrition Facts":  Decide how many servings you will have.  Multiply the number of servings by the number of carbohydrates per serving.  The resulting number is the total amount of carbohydrates that you will be having. Learning standard serving sizes of other foods When you eat carbohydrate foods that are not packaged or do not include "Nutrition Facts" on the label, you need to measure the servings in order to count the amount of carbohydrates:  Measure the foods that you will eat with a food scale or measuring cup, if  needed.  Decide how many standard-size servings you will eat.  Multiply the number of servings by 15. Most carbohydrate-rich foods have about 15 g of carbohydrates per serving. ? For example, if you eat 8 oz (170 g) of strawberries, you will have eaten 2 servings and 30 g of carbohydrates (2 servings x 15 g = 30 g).  For foods that have more than one food mixed, such as soups and casseroles, you must count the carbohydrates in each food that is included. The following list contains standard serving sizes of common carbohydrate-rich foods. Each of these servings has about 15 g of carbohydrates:   hamburger bun or  English muffin.   oz (15 mL) syrup.   oz (14 g) jelly.  1 slice of bread.  1 six-inch tortilla.  3 oz (85 g) cooked rice or pasta.  4 oz (113 g) cooked dried beans.  4 oz (113 g) starchy vegetable, such as peas, corn, or potatoes.  4 oz (113 g) hot cereal.  4 oz (113 g) mashed potatoes or  of a large baked potato.  4 oz (113 g) canned or frozen fruit.  4 oz (120 mL) fruit juice.  4-6 crackers.  6 chicken nuggets.  6 oz (170 g) unsweetened dry cereal.  6 oz (170 g) plain fat-free yogurt or yogurt sweetened with artificial sweeteners.  8 oz (240 mL) milk.  8 oz (170 g) fresh fruit or one small piece of fruit.  24 oz (680 g) popped popcorn. Example of carbohydrate counting Sample meal  3 oz (85 g) chicken breast.  6 oz (170 g)   brown rice.  4 oz (113 g) corn.  8 oz (240 mL) milk.  8 oz (170 g) strawberries with sugar-free whipped topping. Carbohydrate calculation 1. Identify the foods that contain carbohydrates: ? Rice. ? Corn. ? Milk. ? Strawberries. 2. Calculate how many servings you have of each food: ? 2 servings rice. ? 1 serving corn. ? 1 serving milk. ? 1 serving strawberries. 3. Multiply each number of servings by 15 g: ? 2 servings rice x 15 g = 30 g. ? 1 serving corn x 15 g = 15 g. ? 1 serving milk x 15 g = 15 g. ? 1  serving strawberries x 15 g = 15 g. 4. Add together all of the amounts to find the total grams of carbohydrates eaten: ? 30 g + 15 g + 15 g + 15 g = 75 g of carbohydrates total. Summary  Carbohydrate counting is a method of keeping track of how many carbohydrates you eat.  Eating carbohydrates naturally increases the amount of sugar (glucose) in the blood.  Counting how many carbohydrates you eat helps keep your blood glucose within normal limits, which helps you manage your diabetes.  A diet and nutrition specialist (registered dietitian) can help you make a meal plan and calculate how many carbohydrates you should have at each meal and snack. This information is not intended to replace advice given to you by your health care provider. Make sure you discuss any questions you have with your health care provider. Document Revised: 08/01/2016 Document Reviewed: 06/21/2015 Elsevier Patient Education  2020 Elsevier Inc.  

## 2019-05-06 LAB — UA/M W/RFLX CULTURE, ROUTINE
Bilirubin, UA: NEGATIVE
Glucose, UA: NEGATIVE
Ketones, UA: NEGATIVE
Leukocytes,UA: NEGATIVE
Nitrite, UA: NEGATIVE
Protein,UA: NEGATIVE
RBC, UA: NEGATIVE
Specific Gravity, UA: 1.02 (ref 1.005–1.030)
Urobilinogen, Ur: 0.2 mg/dL (ref 0.2–1.0)
pH, UA: 5 (ref 5.0–7.5)

## 2019-05-06 LAB — MICROSCOPIC EXAMINATION
Bacteria, UA: NONE SEEN
Casts: NONE SEEN /lpf
RBC, Urine: NONE SEEN /hpf (ref 0–2)

## 2019-05-11 ENCOUNTER — Other Ambulatory Visit: Payer: Self-pay

## 2019-05-11 ENCOUNTER — Ambulatory Visit: Payer: Medicaid Other

## 2019-05-11 NOTE — Progress Notes (Incomplete)
Follow up Diabetes/ Endocrine [facility]    Patient ID: Angelica [name], female   DOB: 1960-09-11, 59 y.o.   MRN: [id number] Assessment:  Angelica [name] is a 59 y.o. female who is seen in follow up for No primary diagnosis found. at the request of Tawni Millers, MD.  Assessment/Plan:  1. Diabetes mellitus Currently taking metformin 1000mg  BID, Lantus 36u daily, and Ozempic 5mg  once a week. Tolerates medications well with no GI distress, symptoms of hypoglycemia. Notes decreased appetite and weight loss of 3lbs with Ozempic. Some concern for blurry vision and nocturia. Nocturia remains consistent in frequency. No peripheral neuropathy or chest pain and SOB. Diet concerning for juice intake, though patient is working on reducing intake. A1c down to 8.9 in Apr 2021 from 10.9 in Dec 2020. UACR in Sept 2020 of 30. Patient appropriately on GLP-4 agonist for diabetes in setting of borderline microalbuminuria and HTN.  - Cont metformin 1000mg  BID, Lantus 36u daily, Ozempic 5mg  daily, atorvastatin 40mg  daily - Counseled patient on reducing juice intake  - Consider speaking to patient in more detail about food intake on f/u visit - UACR due Sept 2021 - A1c due  - Refer to [facility] for annual DR screening - f/u visit in 3 mo  Subjective:  CC: Diabete f/u HPI: Angelica [name] is a 59yo woman with PMHx of diabetes, HTN who presents to follow-up at [facility] for diabetes management. Current medication regimen includes metofrmin 1000mg  BID, Lantus 36u daily at night, Ozempic 5mg  once a week, atorvastatin 40mg  daily. Denies any abdominal pain, diarrhea, or upset stomach. Denies shakes, chills, any feelings of passing out. Notes that she is less hungry and loss about 3 lbs recently. Takes bg at home about 2x a week and checks it three times in that day. Recent home bg readings show 150s in the morning. Denies any SOB, CP, difficulty urinating, blood in urine, difficulty with balance, difficulty walking. Does  hae a history of constipation that presented with bright red blood in stool when straining to use the bathroom a couple weeks ago. Denies any current active bleeding from rectum. Does not currently have numbness/tingling in fingers or toes/legs. Reports that she is able to know when blood sugar levels are high, as she will get numbness/tingling in fingers. Goes to bathroom 1x a night, which has been pretty consistent. Frequency of bathroom use at night increases when blood sugars are high or when blood pressure med is taken later at night. Endorses blurry vision, and states that she last saw someone for her eyes about a year ago. Blurry vision comes and goes and has been happening ever since visit with the eye doctor.Can tell when blood sugars get high - tingling/numbness in fingers (but not current and does not appear in toes or legs). Has restless legs every night that disturbs her sleep, but is not currently taking any medications.   Patient reports "eats about everything" for her food intake, no counting carbs. Drinks primarily water. Does drink regular soda that is diluted with water about 2x a week. Drinks 1 cup of apple juice in the morning and for when she has constipation to help with bowel movements. But intake of apple juice fluctuates. Also drinks unsweetened tea  SH:  Recently lost her month 79mo ago due to COVID (pneumonia). Currently trying to take care of herself as best as possible. Has a grandchild who is turning 3 in June!  ROS: As per HPI  Review of Systems  Constitutional: Positive for appetite change.  Eyes: Positive for visual disturbance.  Respiratory: Negative for apnea and shortness of breath.   Cardiovascular: Negative for chest pain.  Gastrointestinal: Negative for abdominal pain, blood in stool and diarrhea.  Genitourinary: Negative for decreased urine volume, difficulty urinating and hematuria.  Neurological: Negative for syncope, light-headedness and numbness.    RAKSHA NAJAFI  has a past medical history of Bell palsy (08/04/2014), GERD (gastroesophageal reflux disease) (01/27/2015), History of Bell's palsy (June 2016), Hyperlipidemia (01/27/2015), Hypertension (01/27/2015), and Type 2 diabetes mellitus ([place]) (08/04/2014).  Family History, Social History, current Medications and allergies reviewed and updated in Epic.  Objective:    Last menstrual period 03/11/2013. Physical Exam not conducted given Telemedicine visist  Data : I have personally reviewed pertinent labs and imaging studies, if indicated,  with the patient in clinic today.   Lab Orders  No laboratory test(s) ordered today    HC Readings from Last 3 Encounters:  No data found for [facility]    Wt Readings from Last 3 Encounters:  05/05/19 195 lb 4.8 oz (88.6 kg)  04/28/19 192 lb 1.6 oz (87.1 kg)  01/27/19 188 lb 1.6 oz (85.3 kg)

## 2019-06-14 ENCOUNTER — Observation Stay
Admission: EM | Admit: 2019-06-14 | Discharge: 2019-06-15 | Disposition: A | Discharge status: Home / Self Care | Payer: Self-pay | Attending: Internal Medicine | Admitting: Internal Medicine

## 2019-06-14 ENCOUNTER — Other Ambulatory Visit: Payer: Self-pay

## 2019-06-14 ENCOUNTER — Emergency Department: Payer: Self-pay

## 2019-06-14 ENCOUNTER — Observation Stay: Payer: Self-pay

## 2019-06-14 ENCOUNTER — Encounter: Payer: Self-pay | Admitting: Emergency Medicine

## 2019-06-14 DIAGNOSIS — Z7982 Long term (current) use of aspirin: Secondary | ICD-10-CM | POA: Insufficient documentation

## 2019-06-14 DIAGNOSIS — K219 Gastro-esophageal reflux disease without esophagitis: Secondary | ICD-10-CM | POA: Insufficient documentation

## 2019-06-14 DIAGNOSIS — I119 Hypertensive heart disease without heart failure: Secondary | ICD-10-CM | POA: Insufficient documentation

## 2019-06-14 DIAGNOSIS — Z794 Long term (current) use of insulin: Secondary | ICD-10-CM | POA: Insufficient documentation

## 2019-06-14 DIAGNOSIS — Z79899 Other long term (current) drug therapy: Secondary | ICD-10-CM | POA: Insufficient documentation

## 2019-06-14 DIAGNOSIS — E1165 Type 2 diabetes mellitus with hyperglycemia: Secondary | ICD-10-CM | POA: Insufficient documentation

## 2019-06-14 DIAGNOSIS — E785 Hyperlipidemia, unspecified: Secondary | ICD-10-CM | POA: Insufficient documentation

## 2019-06-14 DIAGNOSIS — E119 Type 2 diabetes mellitus without complications: Secondary | ICD-10-CM

## 2019-06-14 DIAGNOSIS — Z20822 Contact with and (suspected) exposure to covid-19: Secondary | ICD-10-CM | POA: Insufficient documentation

## 2019-06-14 DIAGNOSIS — I6381 Other cerebral infarction due to occlusion or stenosis of small artery: Principal | ICD-10-CM | POA: Insufficient documentation

## 2019-06-14 DIAGNOSIS — I639 Cerebral infarction, unspecified: Secondary | ICD-10-CM | POA: Diagnosis present

## 2019-06-14 DIAGNOSIS — Z8616 Personal history of COVID-19: Secondary | ICD-10-CM | POA: Insufficient documentation

## 2019-06-14 DIAGNOSIS — I10 Essential (primary) hypertension: Secondary | ICD-10-CM | POA: Insufficient documentation

## 2019-06-14 DIAGNOSIS — I313 Pericardial effusion (noninflammatory): Secondary | ICD-10-CM | POA: Insufficient documentation

## 2019-06-14 LAB — COMPREHENSIVE METABOLIC PANEL
ALT: 23 U/L (ref 0–44)
AST: 18 U/L (ref 15–41)
Albumin: 3.8 g/dL (ref 3.5–5.0)
Alkaline Phosphatase: 113 U/L (ref 38–126)
Anion gap: 7 (ref 5–15)
BUN: 12 mg/dL (ref 6–20)
CO2: 26 mmol/L (ref 22–32)
Calcium: 9.2 mg/dL (ref 8.9–10.3)
Chloride: 106 mmol/L (ref 98–111)
Creatinine, Ser: 0.61 mg/dL (ref 0.44–1.00)
GFR calc Af Amer: >60 mL/min (ref >60)
GFR calc non Af Amer: >60 mL/min (ref >60)
Glucose, Bld: 141 mg/dL — ABNORMAL HIGH (ref 70–99)
Potassium: 3.8 mmol/L (ref 3.5–5.1)
Sodium: 139 mmol/L (ref 135–145)
Total Bilirubin: 0.6 mg/dL (ref 0.3–1.2)
Total Protein: 8 g/dL (ref 6.5–8.1)

## 2019-06-14 LAB — DIFFERENTIAL
Abs Immature Granulocytes: 0.03 10*3/uL (ref 0.00–0.07)
Basophils Absolute: 0 10*3/uL (ref 0.0–0.1)
Basophils Relative: 0 %
Eosinophils Absolute: 0.1 10*3/uL (ref 0.0–0.5)
Eosinophils Relative: 1 %
Immature Granulocytes: 0 %
Lymphocytes Relative: 38 %
Lymphs Abs: 2.6 10*3/uL (ref 0.7–4.0)
Monocytes Absolute: 0.5 10*3/uL (ref 0.1–1.0)
Monocytes Relative: 7 %
Neutro Abs: 3.7 10*3/uL (ref 1.7–7.7)
Neutrophils Relative %: 54 %

## 2019-06-14 LAB — CBC
HCT: 39.2 % (ref 36.0–46.0)
Hemoglobin: 12.9 g/dL (ref 12.0–15.0)
MCH: 26.5 pg (ref 26.0–34.0)
MCHC: 32.9 g/dL (ref 30.0–36.0)
MCV: 80.5 fL (ref 80.0–100.0)
Platelets: 338 10*3/uL (ref 150–400)
RBC: 4.87 MIL/uL (ref 3.87–5.11)
RDW: 14.1 % (ref 11.5–15.5)
WBC: 6.9 10*3/uL (ref 4.0–10.5)
nRBC: 0 % (ref 0.0–0.2)

## 2019-06-14 LAB — APTT: aPTT: 29 seconds (ref 24–36)

## 2019-06-14 LAB — PROTIME-INR
INR: 1 (ref 0.8–1.2)
Prothrombin Time: 13.1 seconds (ref 11.4–15.2)

## 2019-06-14 LAB — SARS CORONAVIRUS 2 BY RT PCR (HOSPITAL ORDER, PERFORMED IN ~~LOC~~ HOSPITAL LAB): SARS Coronavirus 2: NEGATIVE

## 2019-06-14 LAB — GLUCOSE, CAPILLARY
Glucose-Capillary: 142 mg/dL — ABNORMAL HIGH (ref 70–99)
Glucose-Capillary: 151 mg/dL — ABNORMAL HIGH (ref 70–99)
Glucose-Capillary: 75 mg/dL (ref 70–99)

## 2019-06-14 IMAGING — MR MR MRA HEAD W/O CM
1 series · 19 of 48 positions shown · non-contrast
Comparison: None.

CLINICAL DATA: Stroke follow-up. Left internal capsule infarct on
MRI.

EXAM:
MRA HEAD WITHOUT CONTRAST
TECHNIQUE: Angiographic images of the Circle of Willis were obtained using MRA
technique without intravenous contrast.

[Series 5: TOF · axial · 0.5mm · 0.41mm/px · z∈[-134,-44]mm · 19 of 205 slices shown]
[im 1/205]
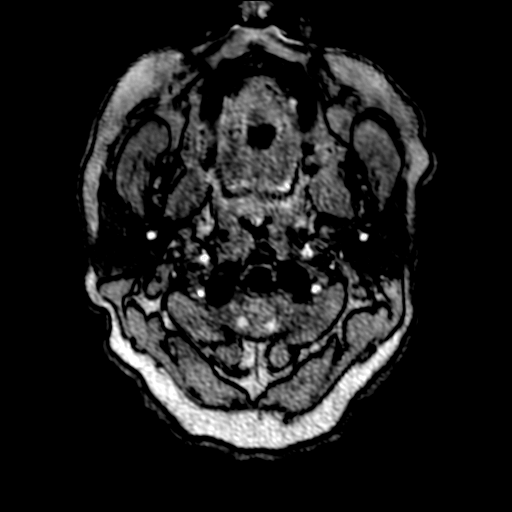
[im 5/205]
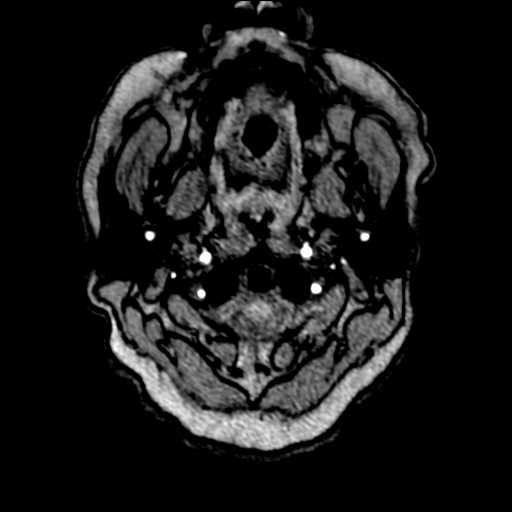
[im 9/205]
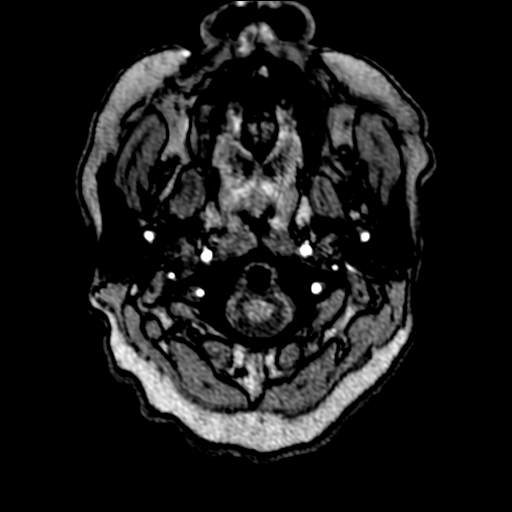
[im 14/205]
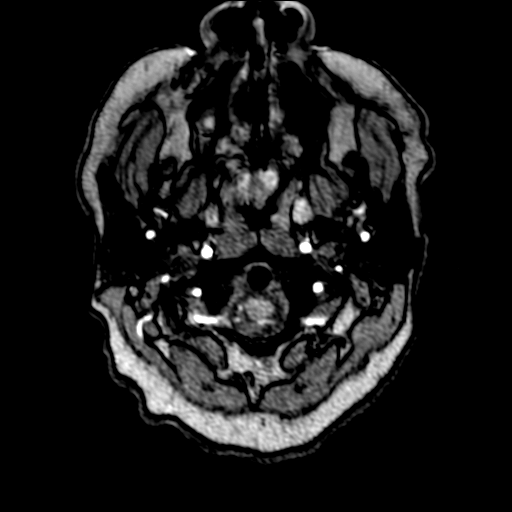
[im 18/205]
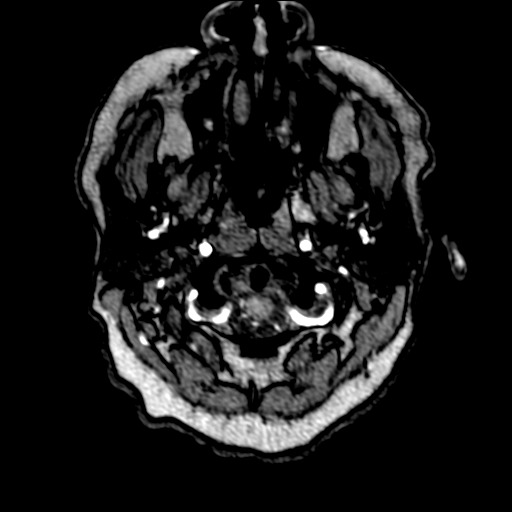
[im 22/205]
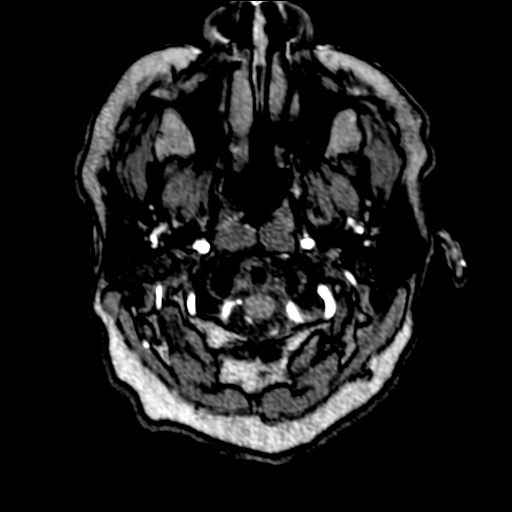
[im 27/205]
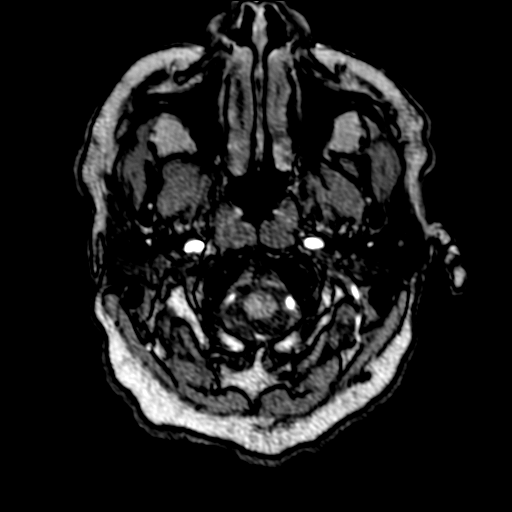
[im 31/205]
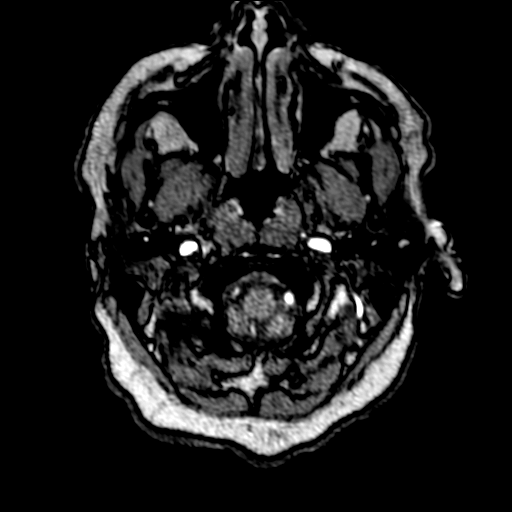
[im 35/205]
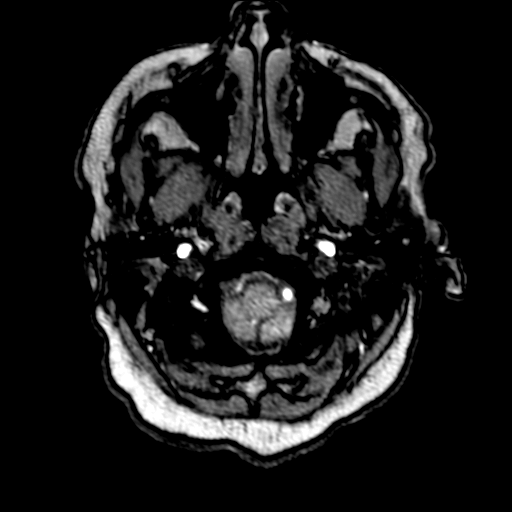
[im 40/205]
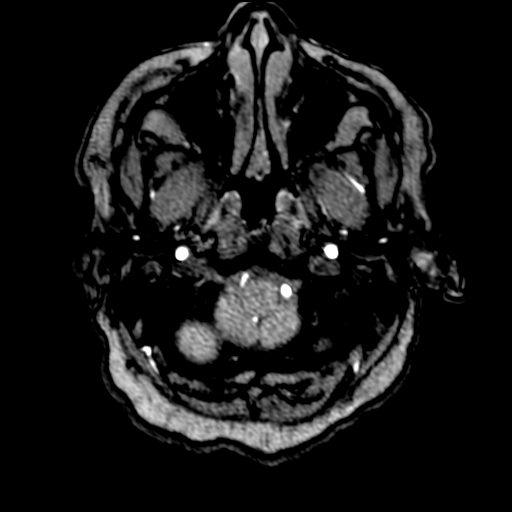
[im 44/205]
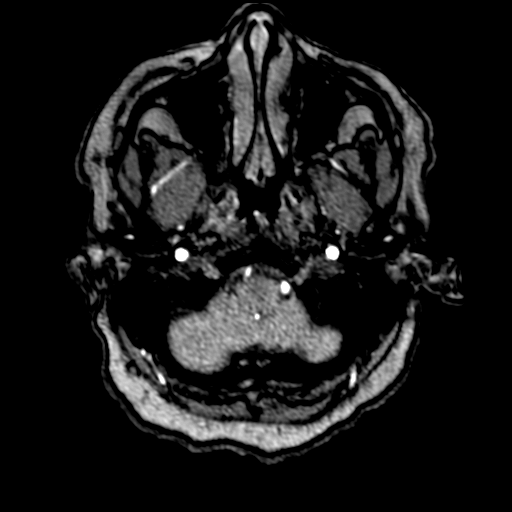
[im 66/205]
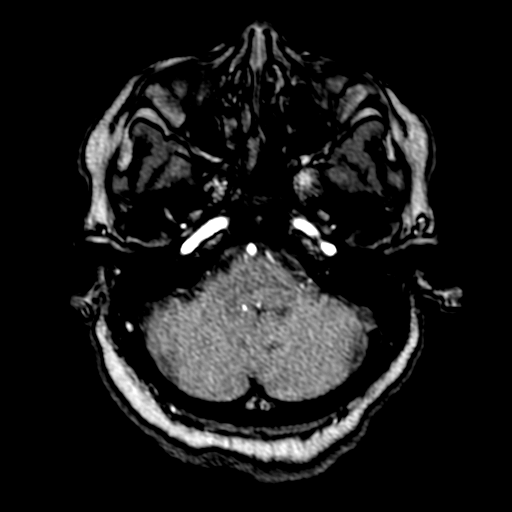
[im 92/205]
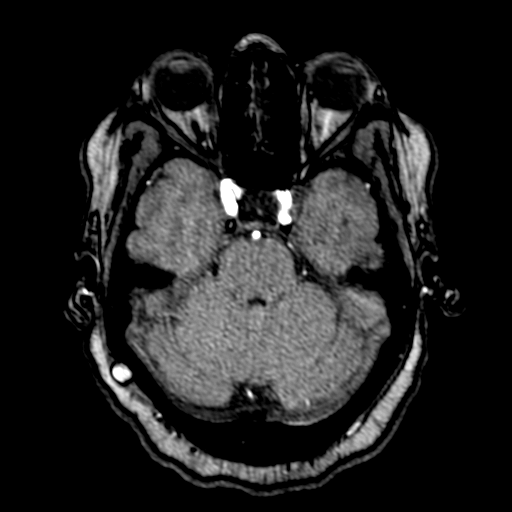
[im 105/205]
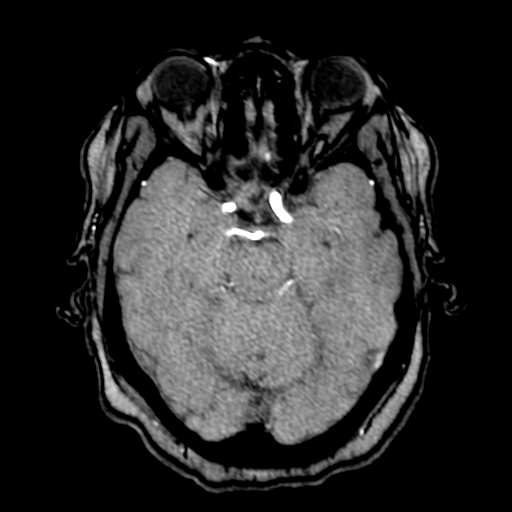
[im 118/205]
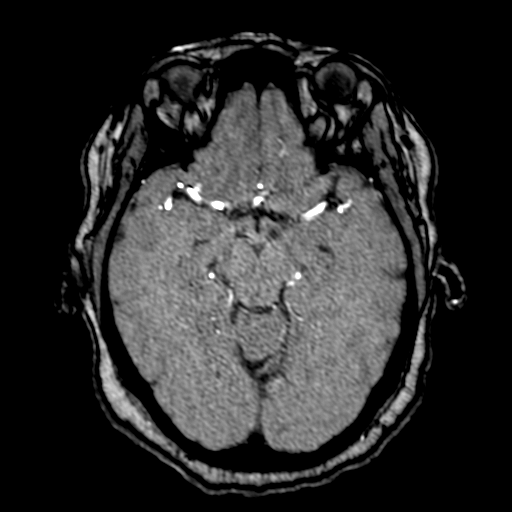
[im 144/205]
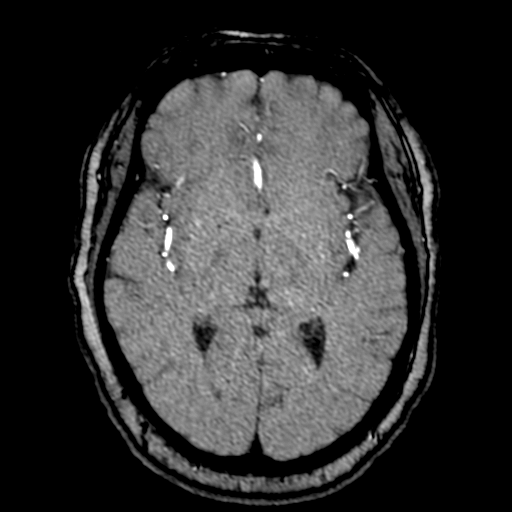
[im 170/205]
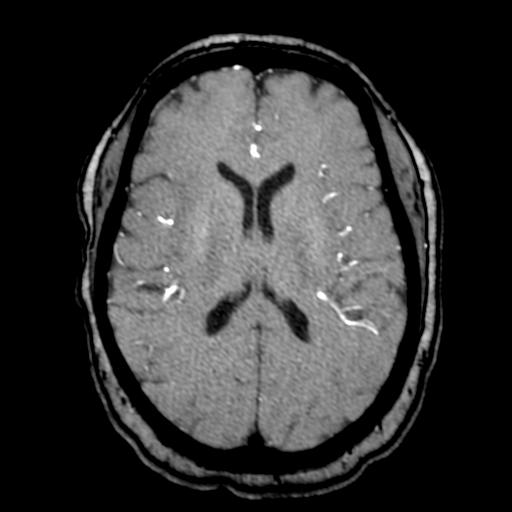
[im 174/205]
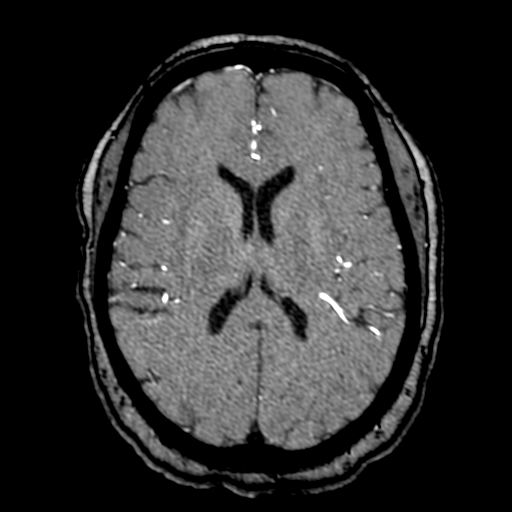
[im 196/205]
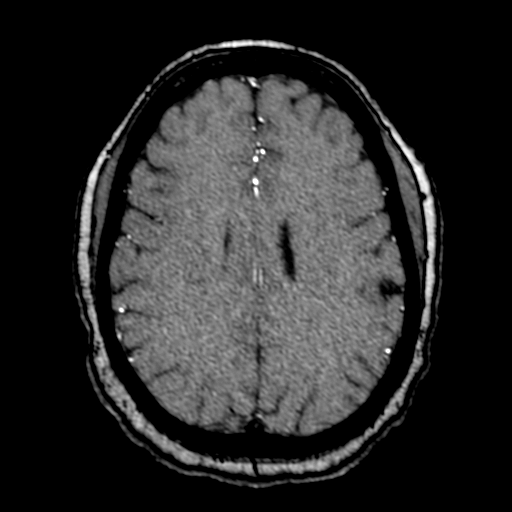

[19 of 48 positions shown; findings below may reference images not displayed]

FINDINGS: The visualized distal vertebral arteries are patent to the basilar.
The left vertebral artery is strongly dominant without stenosis. An
apparent 7 mm long segment of severe narrowing involving the
proximal right V4 segment may be artifactual signal loss secondary
to small vessel size based on the source images although an
underlying stenosis is also possible. Patent right PICA, bilateral
AICA, and bilateral SCA origins are visualized. The basilar artery
is widely patent. Both PCAs are patent without evidence of a
significant stenosis on the right. There is a severe mid left P2
stenosis.

The internal carotid arteries are widely patent from skull base to
carotid termini. ACAs and MCAs are patent without evidence of a
proximal branch occlusion or a significant proximal stenosis. No
aneurysm is identified.
IMPRESSION: 1. No large vessel occlusion.
2. Severe mid left P2 stenosis.
3. V4 segment artifact versus stenosis in the non-dominant right
vertebral artery.

## 2019-06-14 IMAGING — MR MR HEAD W/O CM
12 series · 48 of 48 positions shown · non-contrast
Comparison: Head CT [DATE]

CLINICAL DATA: Dizziness for 1 day. Numbness involving the lips as
well as right thumb, index finger, and long finger.

EXAM:
MRI HEAD WITHOUT CONTRAST
TECHNIQUE: Multiplanar, multiecho pulse sequences of the brain and surrounding
structures were obtained without intravenous contrast.

[Series 5: ax dwi_tracew · axial · 3.0mm · 0.71mm/px · z∈[-140,+13]mm · 5 of 52 slices shown]
[im 1/52]
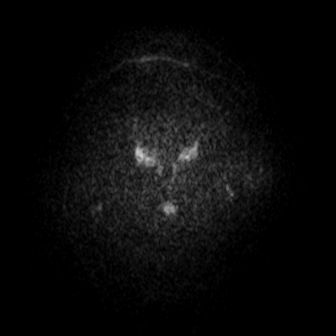
[im 13/52]
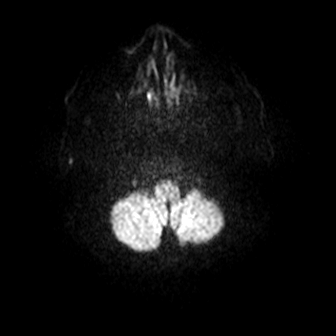
[im 26/52]
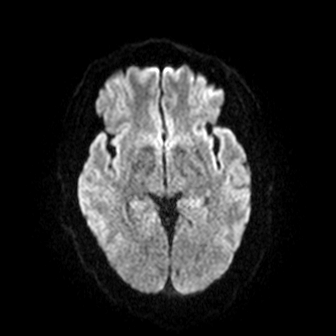
[im 39/52]
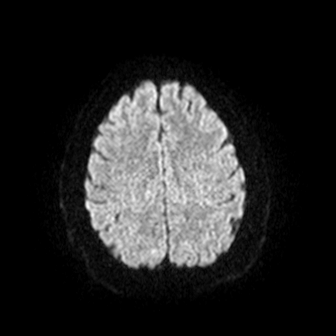
[im 52/52]
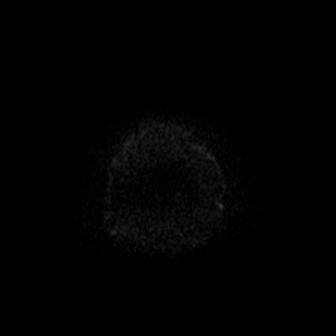

[Series 6: ax dwi_adc · axial · 3.0mm · 0.71mm/px · z∈[-140,+13]mm · 4 of 52 slices shown]
[im 1/52]
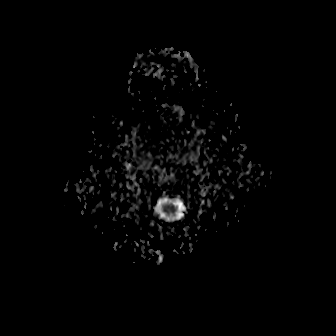
[im 18/52]
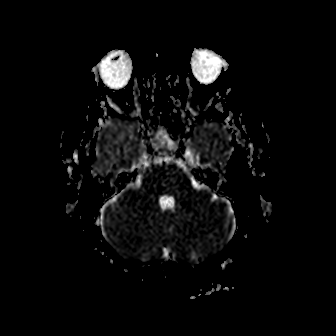
[im 35/52]
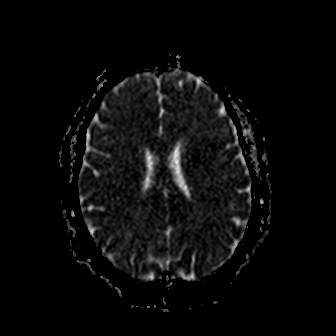
[im 52/52]
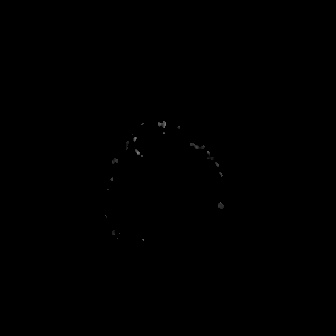

[Series 7: cor dwi_tracew · coronal · 5.0mm · 0.68mm/px · 3 of 36 slices shown]
[im 1/36]
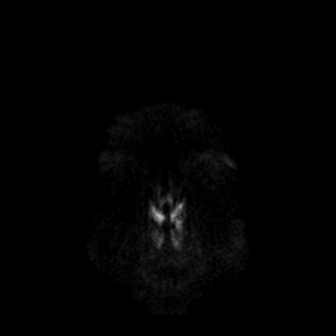
[im 18/36]
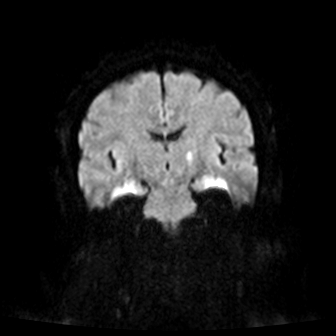
[im 36/36]
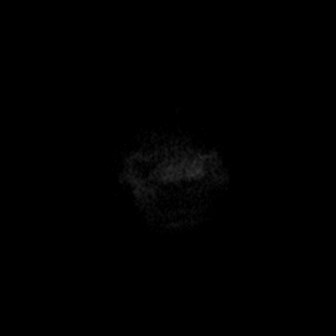

[Series 8: cor dwi_adc · coronal · 5.0mm · 0.68mm/px · 3 of 36 slices shown]
[im 1/36]
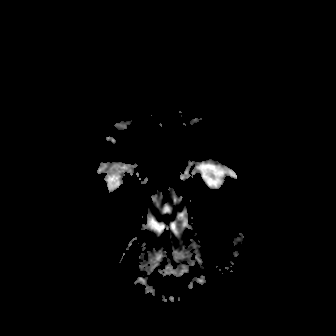
[im 18/36]
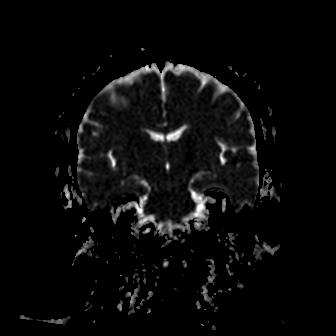
[im 36/36]
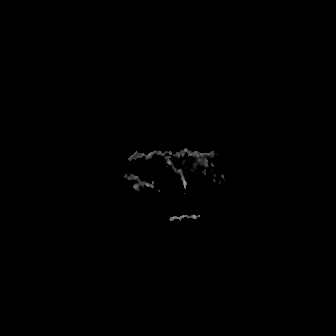

[Series 9: T1 · sagittal · 5.0mm · 0.94mm/px · 2 of 21 slices shown (1 of 2)]
[im 1/21]
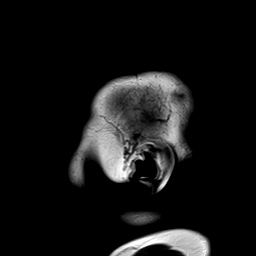
[im 21/21]
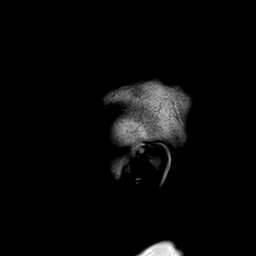

[Series 10: T2 · axial · 5.0mm · 0.45mm/px · z∈[-140,+15]mm · 2 of 27 slices shown (1 of 2)]
[im 1/27]
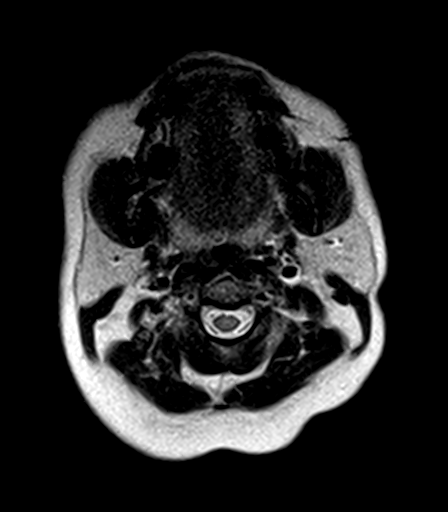
[im 27/27]
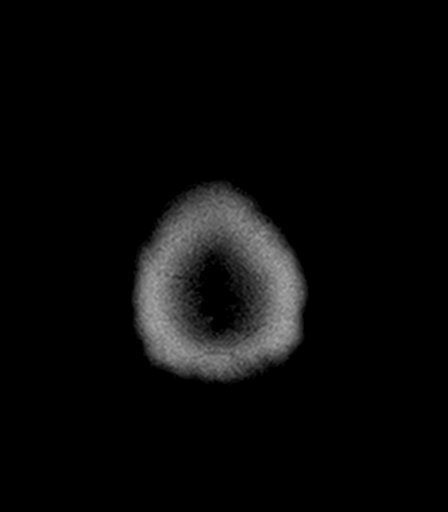

[Series 11: mag_images · axial · 3.0mm · 0.90mm/px · z∈[-142,+10]mm · 4 of 52 slices shown]
[im 1/52]
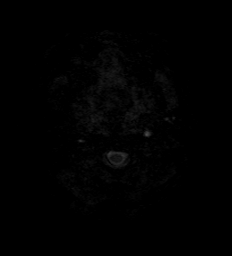
[im 18/52]
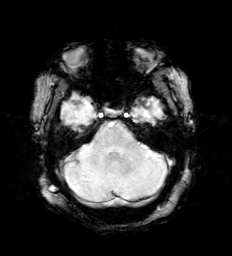
[im 35/52]
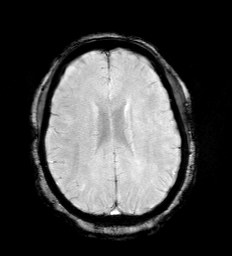
[im 52/52]
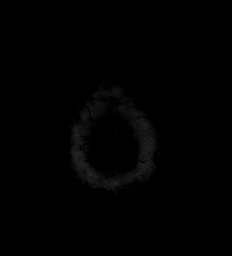

[Series 12: pha_images · axial · 3.0mm · 0.90mm/px · z∈[-142,+10]mm · 4 of 52 slices shown]
[im 1/52]
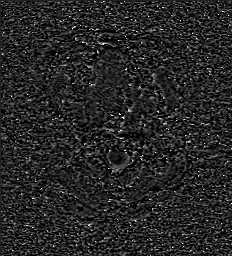
[im 18/52]
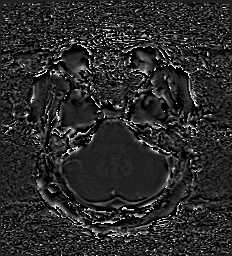
[im 35/52]
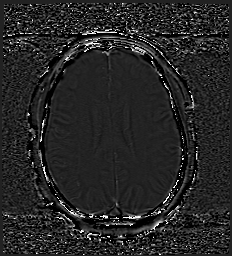
[im 52/52]
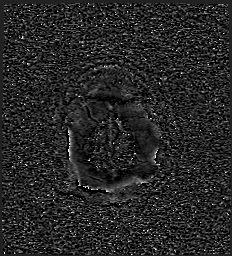

[Series 13: swi_images · axial · 3.0mm · 0.90mm/px · z∈[-142,+10]mm · 4 of 52 slices shown]
[im 1/52]
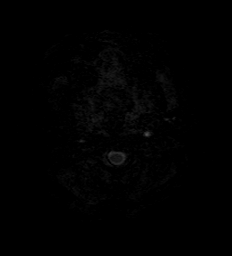
[im 18/52]
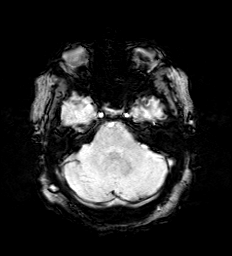
[im 35/52]
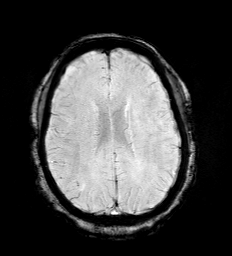
[im 52/52]
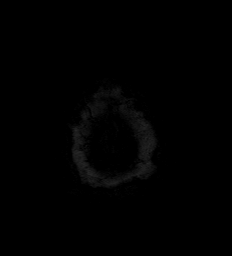

[Series 15: FLAIR · axial · 3.0mm · 0.53mm/px · z∈[-146,+15]mm · 4 of 55 slices shown]
[im 1/55]
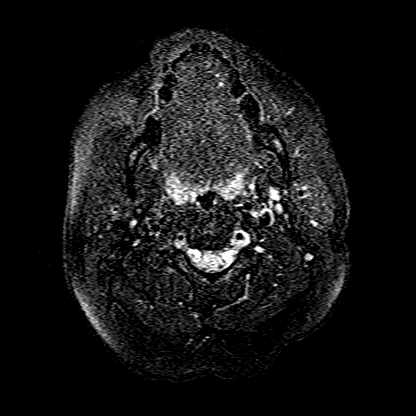
[im 19/55]
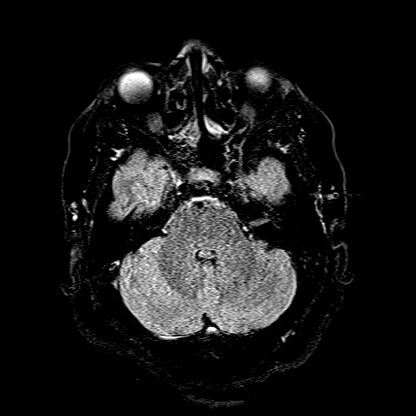
[im 37/55]
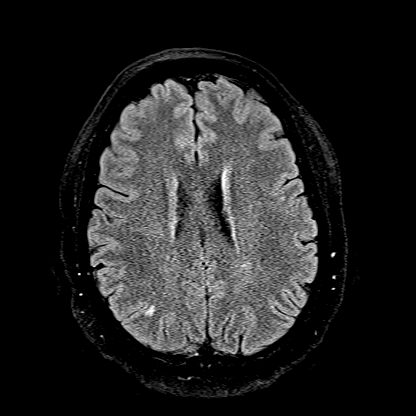
[im 55/55]
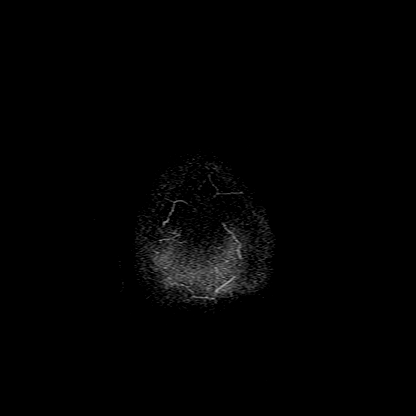

[Series 16: T1 · axial · 1.0mm · 0.98mm/px · z∈[-138,+4]mm · 11 of 144 slices shown (2 of 2)]
[im 1/144]
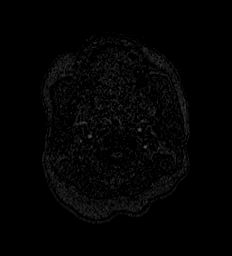
[im 15/144]
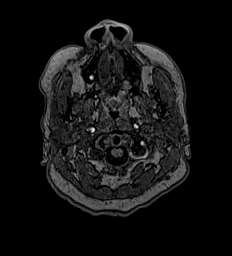
[im 29/144]
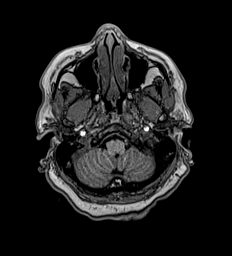
[im 43/144]
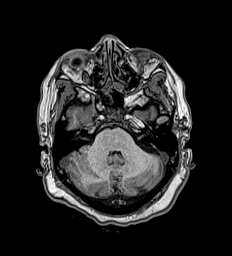
[im 58/144]
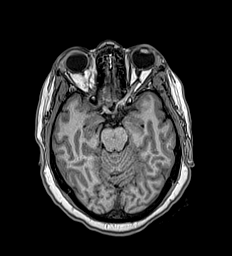
[im 72/144]
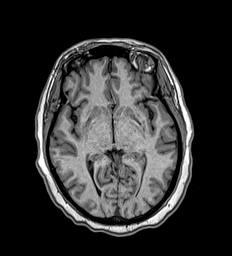
[im 86/144]
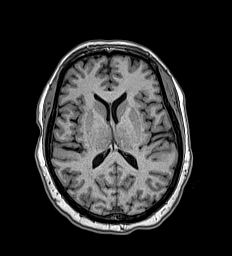
[im 101/144]
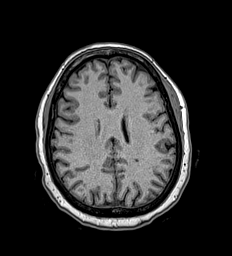
[im 115/144]
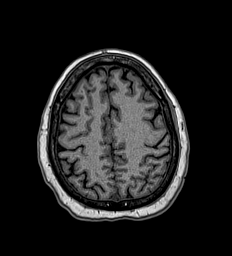
[im 129/144]
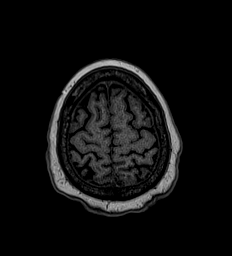
[im 144/144]
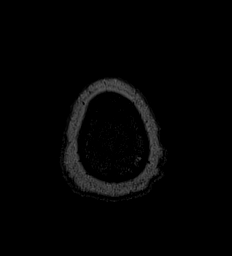

[Series 17: T2 · coronal · 5.0mm · 0.45mm/px · 2 of 29 slices shown (2 of 2)]
[im 1/29]
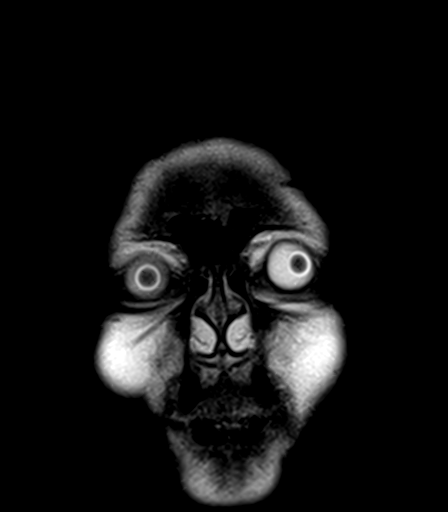
[im 29/29]
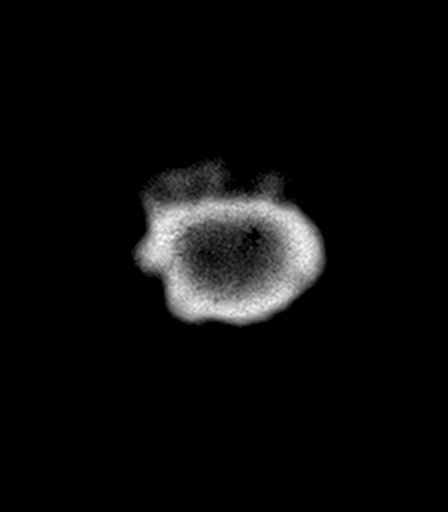

[48 of 48 positions shown; findings below may reference images not displayed]

FINDINGS: Brain: There is a subcentimeter acute infarct in the posterior limb
of the left internal capsule. A few small scattered foci of T2
hyperintensity elsewhere in the cerebral white matter are
nonspecific but compatible with minimal chronic small vessel
ischemic disease. The ventricles and sulci are normal. No
intracranial hemorrhage, mass, midline shift, or extra-axial fluid
collection is identified. There is a mildly expanded partially empty
sella.

Vascular: Major intracranial vascular flow voids are preserved.

Skull and upper cervical spine: Unremarkable bone marrow signal para

Sinuses/Orbits: Unremarkable orbits. Small right maxillary sinus
mucous retention cyst. Clear mastoid air cells.

Other: None.
IMPRESSION: Acute lacunar infarct in the posterior limb of the left internal
capsule.

## 2019-06-14 IMAGING — CT CT HEAD W/O CM
3 series · 15 of 45 positions shown, 18 images · non-contrast
Comparison: None.

CLINICAL DATA: Dizziness for 1 day

EXAM:
CT HEAD WITHOUT CONTRAST
TECHNIQUE: Contiguous axial images were obtained from the base of the skull
through the vertex without intravenous contrast.

[Series 3: head wo · axial · 0.41mm/px · z∈[-146,-31]mm · 9 of 28 slices shown, 12 images]
[im 3/28  brain]
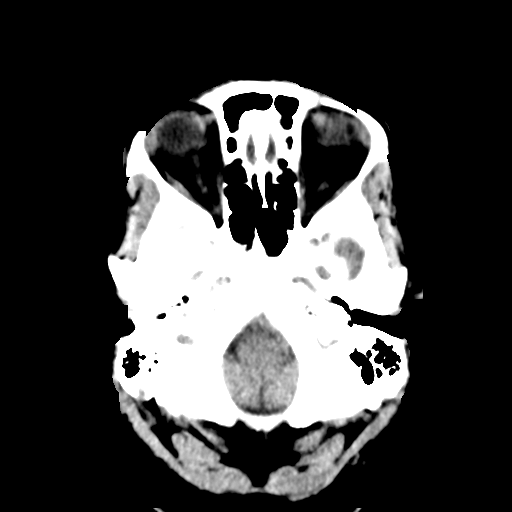
[im 3/28  bone]
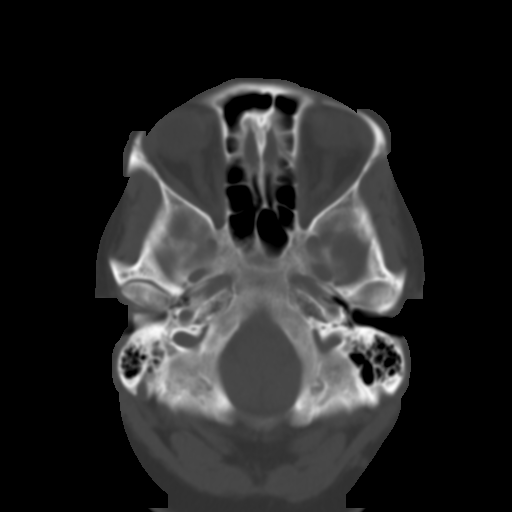
[im 6/28  brain]
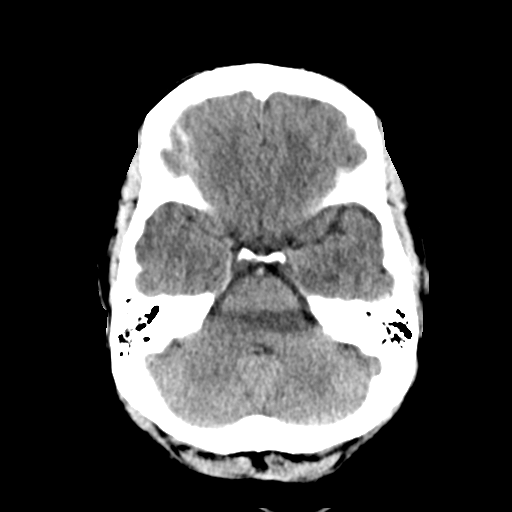
[im 9/28  brain]
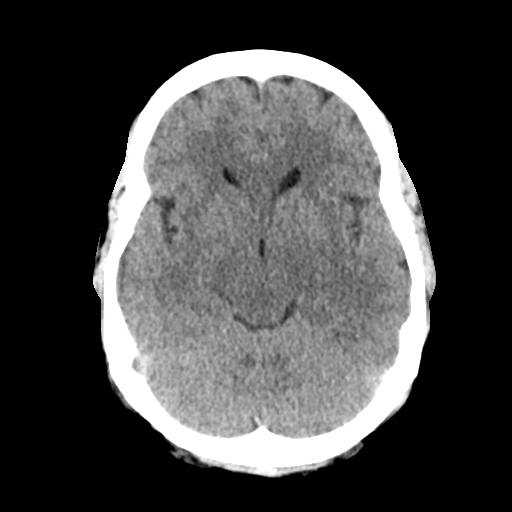
[im 12/28  brain]
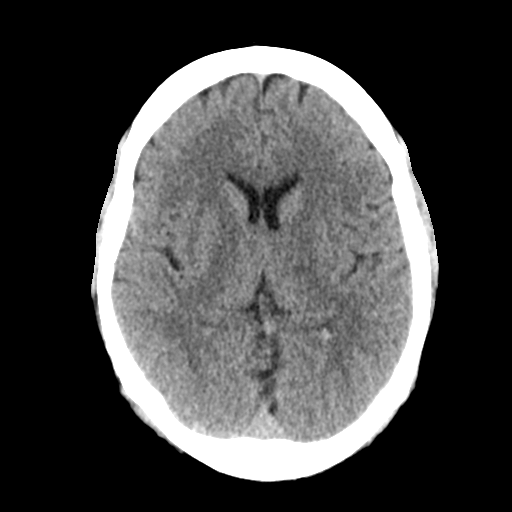
[im 15/28  brain]
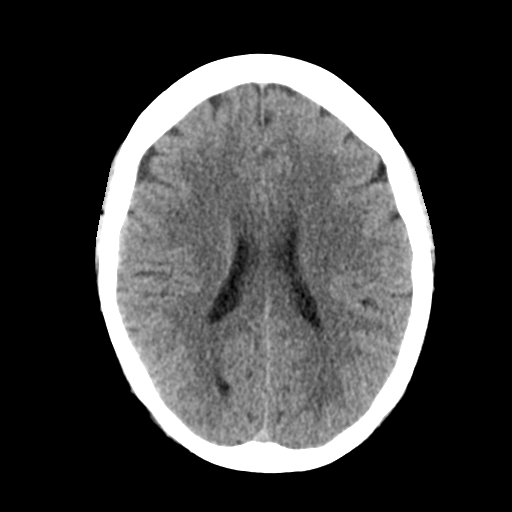
[im 15/28  bone]
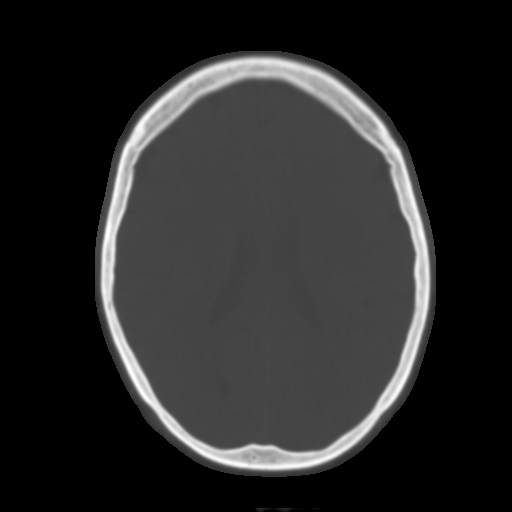
[im 17/28  brain]
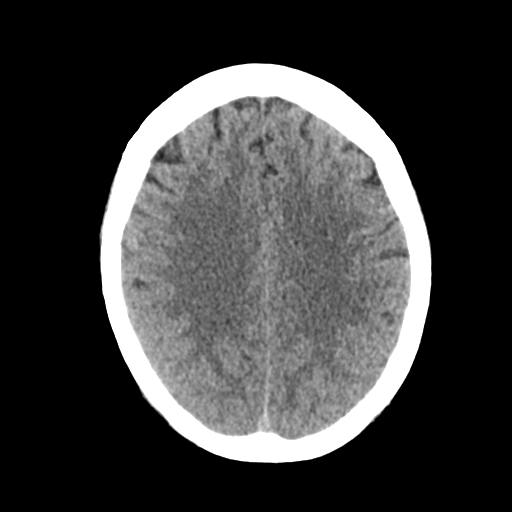
[im 20/28  brain]
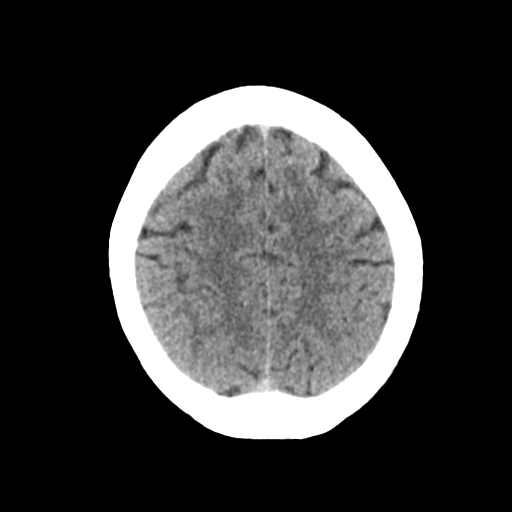
[im 23/28  brain]
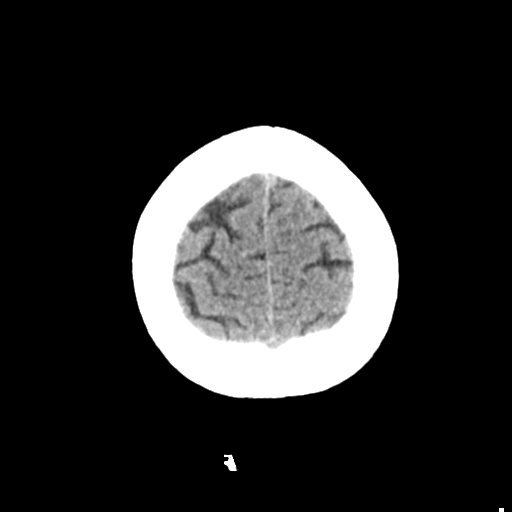
[im 26/28  brain]
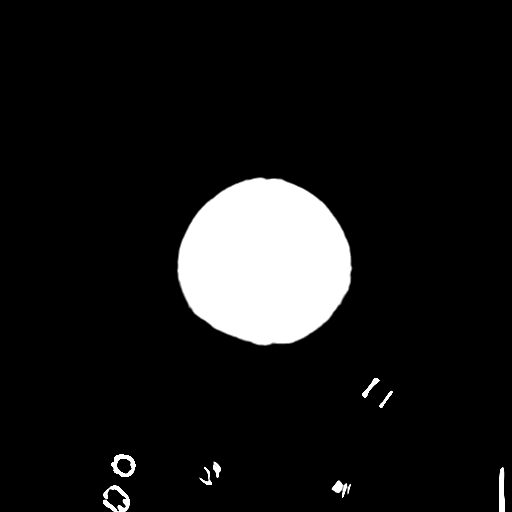
[im 26/28  bone]
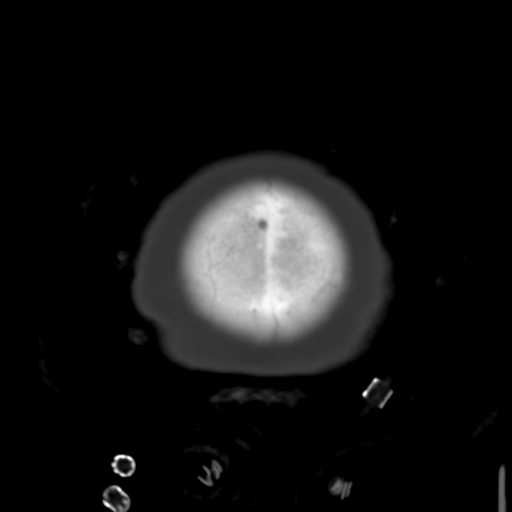

[Series 4: coronal soft tissue · coronal · 0.31mm/px · 3 of 64 slices shown]
[im 22/64  brain]
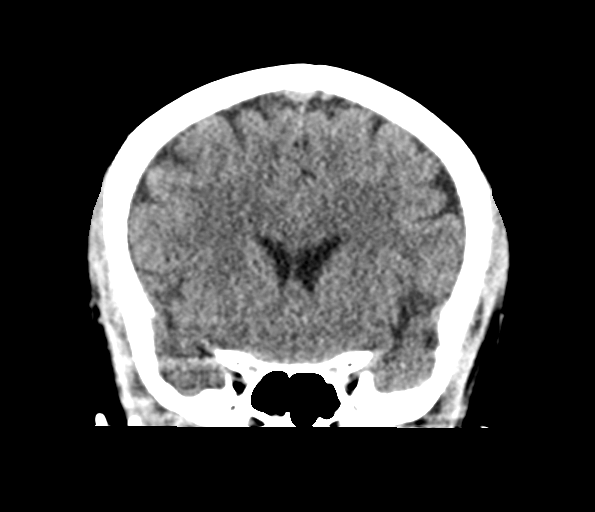
[im 29/64  brain]
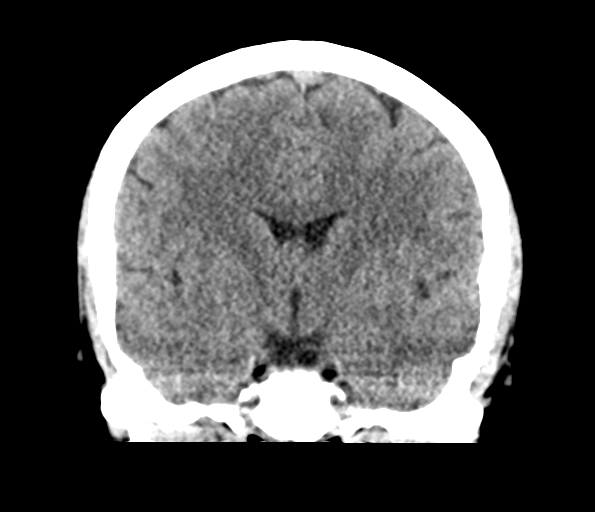
[im 36/64  brain]
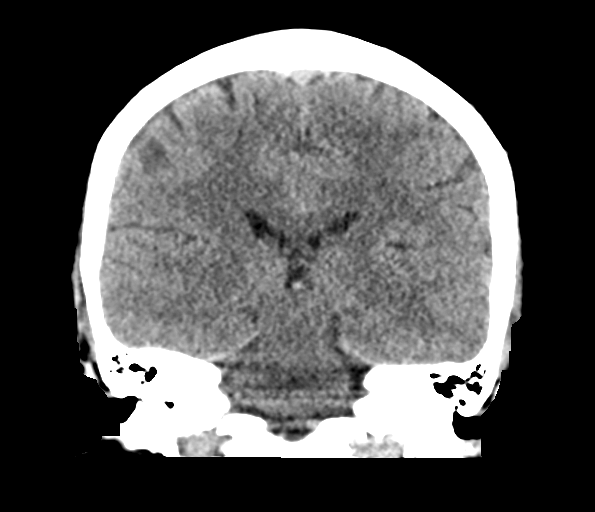

[Series 5: sagittal soft tissue · sagittal · 0.32mm/px · 3 of 54 slices shown]
[im 18/54  brain]
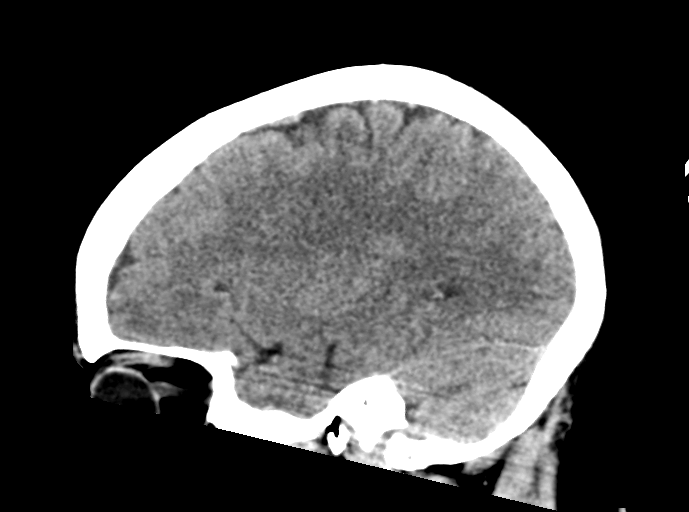
[im 27/54  brain]
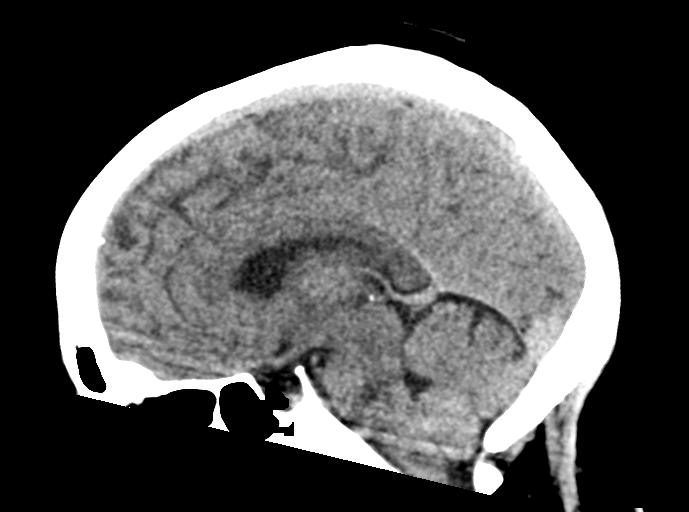
[im 36/54  brain]
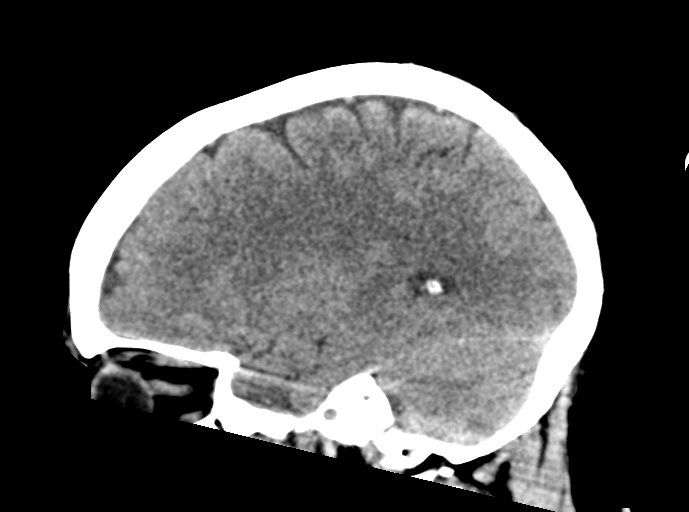

[15 of 45 positions shown; findings below may reference images not displayed]

FINDINGS: Brain: No evidence of acute infarction, hemorrhage, hydrocephalus,
extra-axial collection or mass lesion/mass effect.

Vascular: No hyperdense vessel or unexpected calcification.

Skull: Normal. Negative for fracture or focal lesion.

Sinuses/Orbits: No acute finding.

Other: None.
IMPRESSION: Normal head CT for age

## 2019-06-14 MED ORDER — HYDROCHLOROTHIAZIDE 25 MG PO TABS
12.5000 mg | ORAL_TABLET | Freq: Two times a day (BID) | ORAL | Status: DC
  Administered 2019-06-15: 12.5 mg via ORAL
  Filled 2019-06-14: qty 1

## 2019-06-14 MED ORDER — LORAZEPAM 2 MG PO TABS
2.0000 mg | ORAL_TABLET | Freq: Once | ORAL | Status: AC
  Administered 2019-06-14: 2 mg via ORAL
  Filled 2019-06-14: qty 1

## 2019-06-14 MED ORDER — INSULIN GLARGINE 100 UNIT/ML ~~LOC~~ SOLN
20.0000 [IU] | Freq: Every day | SUBCUTANEOUS | Status: DC
  Administered 2019-06-14: 20 [IU] via SUBCUTANEOUS
  Filled 2019-06-14 (×4): qty 0.2

## 2019-06-14 MED ORDER — ATORVASTATIN CALCIUM 20 MG PO TABS: 40.0000 mg | ORAL_TABLET | Freq: Every day | ORAL | Status: DC

## 2019-06-14 MED ORDER — STROKE: EARLY STAGES OF RECOVERY BOOK: Freq: Once | Status: AC

## 2019-06-14 MED ORDER — HYDRALAZINE HCL 20 MG/ML IJ SOLN: 5.0000 mg | INTRAMUSCULAR | Status: DC | PRN

## 2019-06-14 MED ORDER — SENNOSIDES-DOCUSATE SODIUM 8.6-50 MG PO TABS: 1.0000 | ORAL_TABLET | Freq: Every evening | ORAL | Status: DC | PRN

## 2019-06-14 MED ORDER — ASPIRIN EC 81 MG PO TBEC
81.0000 mg | DELAYED_RELEASE_TABLET | Freq: Every day | ORAL | Status: DC
  Administered 2019-06-14 – 2019-06-15 (×2): 81 mg via ORAL
  Filled 2019-06-14 (×2): qty 1

## 2019-06-14 MED ORDER — ONDANSETRON HCL 4 MG/2ML IJ SOLN: 4.0000 mg | Freq: Three times a day (TID) | INTRAMUSCULAR | Status: DC | PRN

## 2019-06-14 MED ORDER — SODIUM CHLORIDE 0.9% FLUSH: 3.0000 mL | Freq: Once | INTRAVENOUS | Status: DC

## 2019-06-14 MED ORDER — ENOXAPARIN SODIUM 40 MG/0.4ML ~~LOC~~ SOLN
40.0000 mg | SUBCUTANEOUS | Status: DC
  Administered 2019-06-14: 40 mg via SUBCUTANEOUS
  Filled 2019-06-14: qty 0.4

## 2019-06-14 MED ORDER — INSULIN ASPART 100 UNIT/ML ~~LOC~~ SOLN
0.0000 [IU] | SUBCUTANEOUS | Status: DC
  Administered 2019-06-14 – 2019-06-15 (×3): 1 [IU] via SUBCUTANEOUS
  Administered 2019-06-15: 01:00:00 2 [IU] via SUBCUTANEOUS
  Administered 2019-06-15: 1 [IU] via SUBCUTANEOUS
  Filled 2019-06-14 (×5): qty 1

## 2019-06-14 MED ORDER — ACETAMINOPHEN 325 MG PO TABS: 650.0000 mg | ORAL_TABLET | Freq: Four times a day (QID) | ORAL | Status: DC | PRN

## 2019-06-14 MED ORDER — PANTOPRAZOLE SODIUM 40 MG PO TBEC
40.0000 mg | DELAYED_RELEASE_TABLET | Freq: Every day | ORAL | Status: DC
  Administered 2019-06-14 – 2019-06-15 (×2): 40 mg via ORAL
  Filled 2019-06-14 (×2): qty 1

## 2019-06-14 NOTE — ED Notes (Signed)
Patient transferred to MRI 

## 2019-06-14 NOTE — ED Notes (Signed)
Visual Acuity Screening   Both eyes 20/25  Left eye 20/25   6.49m   Right eye 20/70  21.14m

## 2019-06-14 NOTE — ED Notes (Signed)
Patient gone to MRI.

## 2019-06-14 NOTE — H&P (Signed)
History and Physical    Angelica [name] DOB: 12/20/1960 DOA: 06/14/2019  Referring MD/NP/PA:   PCP: Tawni Millers, MD   Patient coming from:  The patient is coming from home.  At baseline, pt is independent for most of ADL.        Chief Complaint: Dizziness, right leg weakness, right hand numbness  HPI: Angelica [name] is a 59 y.o. female with medical history significant of hypertension, hyperlipidemia, diabetes mellitus, GERD, Bell's palsy, COVID-19 infection 12/28/2018, who presents with dizziness, right leg weakness, right hand numbness.  Patient states that her symptoms started yesterday at about 4;30 PM, including dizziness, right leg weakness and right hand numbness.  Patient has history of Bell's palsy with chronic right sided facial droop.  No vision change or hearing loss.  No difficulty speaking or swallowing.  Patient does not have chest pain, shortness breath, cough, fever or chills.  No nausea, vomiting, diarrhea, abdominal pain, symptoms of UTI.  ED Course: pt was found to have WBC 6.9, INR 1.0, pending COVID-19 PCR, electrolytes renal function okay, temperature normal, blood pressure 149/95, heart rate 83, oxygen saturation 98% on room air.  MRI of the brain showed acute lacunar infarct in the posterior limb of the left internal capsule. Pt is placed on MedSurg bed for observation.  Neurology, Dr. Doy Mince is consulted.  Review of Systems:   General: no fevers, chills, no body weight gain, has fatigue HEENT: no blurry vision, hearing changes or sore throat Respiratory: no dyspnea, coughing, wheezing CV: no chest pain, no palpitations GI: no nausea, vomiting, abdominal pain, diarrhea, constipation GU: no dysuria, burning on urination, increased urinary frequency, hematuria  Ext: no leg edema Neuro: no vision change or hearing loss.  Has right leg weakness, right hand numbness, right facial droop, dizziness Skin: no rash, no skin tear. MSK: No muscle spasm,  no deformity, no limitation of range of movement in spin Heme: No easy bruising.  Travel history: No recent long distant travel.  Allergy: No Known Allergies  Past Medical History:  Diagnosis Date  . Bell palsy 08/04/2014  . GERD (gastroesophageal reflux disease) 01/27/2015  . History of Bell's palsy June 2016  . Hyperlipidemia 01/27/2015  . Hypertension 01/27/2015  . Type 2 diabetes mellitus ([place]) 08/04/2014    Past Surgical History:  Procedure Laterality Date  . ABDOMINAL HYSTERECTOMY    . COLONOSCOPY WITH PROPOFOL N/A 10/07/2018   Procedure: COLONOSCOPY WITH PROPOFOL;  Surgeon: Virgel Manifold, MD;  Location: [facility] ENDOSCOPY;  Service: Gastroenterology;  Laterality: N/A;  . OOPHORECTOMY Right 2005    Social History:  reports that she has never smoked. She has never used smokeless tobacco. She reports that she does not drink alcohol or use drugs.  Family History:  Family History  Problem Relation Age of Onset  . Cancer Brother        Colon Cancer  . Breast cancer Neg Hx      Prior to Admission medications   Medication Sig Start Date End Date Taking? Authorizing Provider  aspirin EC 81 MG tablet Take 1 tablet (81 mg total) by mouth daily. 12/25/16   Tawni Millers, MD  atorvastatin (LIPITOR) 40 MG tablet Take 1 tablet (40 mg total) by mouth daily at 6 PM. 12/25/16   Tawni Millers, MD  hydrochlorothiazide (HYDRODIURIL) 12.5 MG tablet Take 1 tablet (12.5 mg total) by mouth 2 (two) times daily. 10/07/17 12/15/19  Gwyndolyn Saxon, FNP  Insulin Glargine (LANTUS SOLOSTAR) 100 UNIT/ML  Solostar Pen Inject 36 Units into the skin daily at 10 pm. Increase by 4 units every 3 days if fasting blood glucose is > 180 mg/dl. Patient taking differently: Inject 40 Units into the skin daily at 10 pm.  10/20/18   Iloabachie, Chioma E, NP  metFORMIN (GLUCOPHAGE-XR) 500 MG 24 hr tablet Take 2 tablets (1,000 mg total) by mouth daily with breakfast AND 2 tablets (1,000 mg total) daily before supper.  01/27/19 04/05/21  Tawni Millers, MD  omeprazole (PRILOSEC) 20 MG capsule Take 1 capsule (20 mg total) by mouth daily. 10/07/17   Gwyndolyn Saxon, FNP  Semaglutide (OZEMPIC) 0.25 or 0.5 MG/DOSE SOPN Inject 0.5 mg into the skin once a week.    [provider]    Physical Exam: Vitals:   06/14/19 1600 06/14/19 1615 06/14/19 1630 06/14/19 1645  BP: 140/81  115/79   Pulse: 82 87 78 93  Resp:      Temp:      TempSrc:      SpO2: 96% 97% 97% 97%  Weight:      Height:       General: Not in acute distress HEENT:       Eyes: PERRL, EOMI, no scleral icterus.       ENT: No discharge from the ears and nose, no pharynx injection, no tonsillar enlargement.        Neck: No JVD, no bruit, no mass felt. Heme: No neck lymph node enlargement. Cardiac: S1/S2, RRR, No murmurs, No gallops or rubs. Respiratory: No rales, wheezing, rhonchi or rubs. GI: Soft, nondistended, nontender, no rebound pain, no organomegaly, BS present. GU: No hematuria Ext: No pitting leg edema bilaterally. 2+DP/PT pulse bilaterally. Musculoskeletal: No joint deformities, No joint redness or warmth, no limitation of ROM in spin. Skin: No rashes.  Neuro: Alert, oriented X3, cranial nerves II-XII grossly intact except for right facial droop, moves all extremities normally. Muscle strength 4/5 in right leg, and 5/5 in all other extremities, sensation to light touch intact. Brachial reflex 2+ bilaterally.  Psych: Patient is not psychotic, no suicidal or hemocidal ideation.  Labs on Admission: I have personally reviewed following labs and imaging studies  CBC: Recent Labs  Lab 06/14/19 1006  WBC 6.9  NEUTROABS 3.7  HGB 12.9  HCT 39.2  MCV 80.5  PLT [id number]   Basic Metabolic Panel: Recent Labs  Lab 06/14/19 1006  NA 139  K 3.8  CL 106  CO2 26  GLUCOSE 141*  BUN 12  CREATININE 0.61  CALCIUM 9.2   GFR: Estimated Creatinine Clearance: 82.9 mL/min (by C-G formula based on SCr of 0.61 mg/dL). Liver Function  Tests: Recent Labs  Lab 06/14/19 1006  AST 18  ALT 23  ALKPHOS 113  BILITOT 0.6  PROT 8.0  ALBUMIN 3.8   No results for input(s): LIPASE, AMYLASE in the last 168 hours. No results for input(s): AMMONIA in the last 168 hours. Coagulation Profile: Recent Labs  Lab 06/14/19 1006  INR 1.0   Cardiac Enzymes: No results for input(s): CKTOTAL, CKMB, CKMBINDEX, TROPONINI in the last 168 hours. BNP (last 3 results) No results for input(s): PROBNP in the last 8760 hours. HbA1C: No results for input(s): HGBA1C in the last 72 hours. CBG: Recent Labs  Lab 06/14/19 1653  GLUCAP 75   Lipid Profile: No results for input(s): CHOL, HDL, LDLCALC, TRIG, CHOLHDL, LDLDIRECT in the last 72 hours. Thyroid Function Tests: No results for input(s): TSH, T4TOTAL, FREET4, T3FREE, THYROIDAB in the  last 72 hours. Anemia Panel: No results for input(s): VITAMINB12, FOLATE, FERRITIN, TIBC, IRON, RETICCTPCT in the last 72 hours. Urine analysis:    Component Value Date/Time   COLORURINE YELLOW (A) 10/05/2014 1300   APPEARANCEUR Clear 05/05/2019 1133   LABSPEC 1.038 (H) 10/05/2014 1300   LABSPEC 1.028 05/30/2013 1905   PHURINE 5.0 10/05/2014 1300   GLUCOSEU Negative 05/05/2019 1133   GLUCOSEU Negative 05/30/2013 1905   HGBUR 1+ (A) 10/05/2014 1300   BILIRUBINUR Negative 05/05/2019 1133   BILIRUBINUR Negative 05/30/2013 1905   KETONESUR 2+ (A) 10/05/2014 1300   PROTEINUR Negative 05/05/2019 1133   PROTEINUR NEGATIVE 10/05/2014 1300   NITRITE Negative 05/05/2019 1133   NITRITE NEGATIVE 10/05/2014 1300   LEUKOCYTESUR Negative 05/05/2019 1133   LEUKOCYTESUR Negative 05/30/2013 1905   Sepsis Labs: @LABRCNTIP (procalcitonin:4,lacticidven:4) )No results found for this or any previous visit (from the past 240 hour(s)).   Radiological Exams on Admission: CT HEAD WO CONTRAST  Result Date: 06/14/2019 CLINICAL DATA:  Dizziness for 1 day EXAM: CT HEAD WITHOUT CONTRAST TECHNIQUE: Contiguous axial images  were obtained from the base of the skull through the vertex without intravenous contrast. COMPARISON:  None. FINDINGS: Brain: No evidence of acute infarction, hemorrhage, hydrocephalus, extra-axial collection or mass lesion/mass effect. Vascular: No hyperdense vessel or unexpected calcification. Skull: Normal. Negative for fracture or focal lesion. Sinuses/Orbits: No acute finding. Other: None. IMPRESSION: Normal head CT for age Electronically Signed   By: Inez Catalina M.D.   On: 06/14/2019 10:23   MR BRAIN WO CONTRAST  Result Date: 06/14/2019 CLINICAL DATA:  Dizziness for 1 day. Numbness involving the lips as well as right thumb, index finger, and long finger. EXAM: MRI HEAD WITHOUT CONTRAST TECHNIQUE: Multiplanar, multiecho pulse sequences of the brain and surrounding structures were obtained without intravenous contrast. COMPARISON:  Head CT 06/14/2019 FINDINGS: Brain: There is a subcentimeter acute infarct in the posterior limb of the left internal capsule. A few small scattered foci of T2 hyperintensity elsewhere in the cerebral white matter are nonspecific but compatible with minimal chronic small vessel ischemic disease. The ventricles and sulci are normal. No intracranial hemorrhage, mass, midline shift, or extra-axial fluid collection is identified. There is a mildly expanded partially empty sella. Vascular: Major intracranial vascular flow voids are preserved. Skull and upper cervical spine: Unremarkable bone marrow signal para Sinuses/Orbits: Unremarkable orbits. Small right maxillary sinus mucous retention cyst. Clear mastoid air cells. Other: None. 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 Electronically Signed   By: Logan Bores M.D.   On: 06/14/2019 14:55     EKG: Independently reviewed.  Sinus rhythm, QTC 446, LAE, low voltage, LAD, poor R wave progression  Assessment/Plan Principal Problem:   Stroke [facility]) Active Problems:   Diabetes mellitus  without complication (HCC)   GERD (gastroesophageal reflux disease)   Hypertension   Hyperlipidemia   Stroke [facility]): MRI of the brain showed acute lacunar infarct in the posterior limb of the left internal capsule. Neurology, Dr. Doy Mince is consulted.   -Placed on MedSurg bed for observation - will follow up Neurology's Recs.  - Obtain MRA  - Check carotid dopplers  - Continue ASA and lipitor - fasting lipid panel and HbA1c  - 2D transthoracic echocardiography  - swallowing screen. If fails, will get SLP - Check UDS  - PT/OT consult  Diabetes mellitus without complication ([place]): Most recent A1c 8.9, poorly controled. Patient is taking Ozempic, Metformin, Lantus at home -will decrease Lantus dose from  40 to 20 units daily -SSI  GERD (gastroesophageal reflux disease) -Protonix  Hypertension -Continue HCTZ (start tomorrow) -IV hydralazine as needed for SBP > 180  Hyperlipidemia -lipitor     DVT ppx: SQ Lovenox Code Status: Full code Family Communication: not done, no family member is at bed side.    Disposition Plan:  Anticipate discharge back to previous environment Consults called: Dr. Doy Mince of neurology Admission status: Med-surg bed for obs    Status is: Observation  The patient remains OBS appropriate and will d/c before 2 midnights.  Dispo: The patient is from: Home              Anticipated d/c is to: Home              Anticipated d/c date is: 1 day              Patient currently is not medically stable to d/c.           Date of Service 06/14/2019    Ivor Costa [facility]   If 7PM-7AM, please contact night-coverage [email or web address] 06/14/2019, 5:53 PM

## 2019-06-14 NOTE — ED Triage Notes (Signed)
C/O dizziness.  Noticed symptoms yesterday after church.  States symptoms worse when standing up.  Also c/o feeling lip numbness and right thumb, index finger and middle finger tingling.  States symptoms started yesterday at around 1430.

## 2019-06-14 NOTE — ED Provider Notes (Signed)
[facility] Emergency Department Provider Note   ____________________________________________    I have reviewed the triage vital signs and the nursing notes.   HISTORY  Chief Complaint Dizziness and Numbness     HPI Angelica [name] is a 59 y.o. female with a history of Bell's palsy, diabetes, hypertension, hyperlipidemia who presents with complaints of feeling off balance and numbness in the lips, and right leg feeling " off".  Patient reports symptoms started yesterday.  She does report that she has been having headaches over the last month as well.  No fevers or chills.  No neck pain.  No nausea vomiting or diaphoresis.    Past Medical History:  Diagnosis Date  . Bell palsy 08/04/2014  . GERD (gastroesophageal reflux disease) 01/27/2015  . History of Bell's palsy June 2016  . Hyperlipidemia 01/27/2015  . Hypertension 01/27/2015  . Type 2 diabetes mellitus ([place]) 08/04/2014    Patient Active Problem List   Diagnosis Date Noted  . Headache 10/20/2018  . Insomnia 10/20/2018  . Anxiety 10/20/2018  . Dysuria 10/20/2018  . Encounter for screening colonoscopy   . Residual hemorrhoidal skin tags   . Internal hemorrhoids   . Polyp of ascending colon   . 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 Healthcare maintenance 09/23/2018  . Menopausal hot flushes 07/20/2015  . GERD (gastroesophageal reflux disease) 01/27/2015  . Hypertension 01/27/2015  . Hyperlipidemia 01/27/2015  . Bell palsy 08/04/2014  . Type 2 diabetes mellitus ([place]) 08/04/2014    Past Surgical History:  Procedure Laterality Date  . ABDOMINAL HYSTERECTOMY    . COLONOSCOPY WITH PROPOFOL N/A 10/07/2018   Procedure: COLONOSCOPY WITH PROPOFOL;  Surgeon: Virgel Manifold, MD;  Location: [facility] ENDOSCOPY;  Service: Gastroenterology;  Laterality: N/A;  . OOPHORECTOMY Right 2005    Prior to Admission medications   Medication Sig Start Date End Date Taking? Authorizing Provider  aspirin EC 81 MG tablet  Take 1 tablet (81 mg total) by mouth daily. 12/25/16   Tawni Millers, MD  atorvastatin (LIPITOR) 40 MG tablet Take 1 tablet (40 mg total) by mouth daily at 6 PM. 12/25/16   Tawni Millers, MD  hydrochlorothiazide (HYDRODIURIL) 12.5 MG tablet Take 1 tablet (12.5 mg total) by mouth 2 (two) times daily. 10/07/17 12/15/19  Gwyndolyn Saxon, FNP  Insulin Glargine (LANTUS SOLOSTAR) 100 UNIT/ML Solostar Pen Inject 36 Units into the skin daily at 10 pm. Increase by 4 units every 3 days if fasting blood glucose is > 180 mg/dl. Patient taking differently: Inject 40 Units into the skin daily at 10 pm.  10/20/18   Iloabachie, Chioma E, NP  metFORMIN (GLUCOPHAGE-XR) 500 MG 24 hr tablet Take 2 tablets (1,000 mg total) by mouth daily with breakfast AND 2 tablets (1,000 mg total) daily before supper. 01/27/19 04/05/21  Tawni Millers, MD  omeprazole (PRILOSEC) 20 MG capsule Take 1 capsule (20 mg total) by mouth daily. 10/07/17   Gwyndolyn Saxon, FNP  Semaglutide (OZEMPIC) 0.25 or 0.5 MG/DOSE SOPN Inject 0.5 mg into the skin once a week.    [provider]     Allergies Patient has no known allergies.  Family History  Problem Relation Age of Onset  . Cancer Brother        Colon Cancer  . Breast cancer Neg Hx     Social History Social History   Tobacco Use  . Smoking status: Never Smoker  . Smokeless tobacco: Never Used  Substance Use Topics  .  Alcohol use: No  . Drug use: No    Review of Systems  Constitutional: No fever/chills Eyes: No visual changes.  ENT: No neck pain Cardiovascular: Denies chest pain. Respiratory: Denies shortness of breath. Gastrointestinal: No abdominal pain.  Genitourinary: Negative for dysuria. Musculoskeletal: Negative for back pain. Skin: Negative for rash. Neurological: As above   ____________________________________________   PHYSICAL EXAM:  VITAL SIGNS: ED Triage Vitals  Enc Vitals Group     BP 06/14/19 1002 (!) 149/95     Pulse Rate  06/14/19 1002 83     Resp 06/14/19 1002 16     Temp 06/14/19 1002 98 F (36.7 C)     Temp Source 06/14/19 1002 Oral     SpO2 06/14/19 1002 98 %     Weight 06/14/19 1003 88.6 kg (195 lb 5.2 oz)     Height 06/14/19 1003 1.6 m (5\' 3" )     Head Circumference --      Peak Flow --      Pain Score 06/14/19 1002 0     Pain Loc --      Pain Edu? --      Excl. in [place]? --     Constitutional: Alert and oriented.  Eyes: Conjunctivae are normal.  PERRLA Nose: No congestion/rhinnorhea. Mouth/Throat: Mucous membranes are moist.   Neck:  Painless ROM Cardiovascular: Normal rate, regular rhythm. Grossly normal heart sounds.  Good peripheral circulation. Respiratory: Normal respiratory effort.  No retractions. Lungs CTAB. Gastrointestinal: Soft and nontender. No distention.  No CVA tenderness.  Musculoskeletal: No lower extremity tenderness nor edema.  Warm and well perfused.  Normal strength in all extremities Neurologic:  Normal speech and language. No gross focal neurologic deficits are appreciated.  Cranial nerves II to XII normal Skin:  Skin is warm, dry and intact. No rash noted. Psychiatric: Mood and affect are normal. Speech and behavior are normal.  ____________________________________________   LABS (all labs ordered are listed, but only abnormal results are displayed)  Labs Reviewed  COMPREHENSIVE METABOLIC PANEL - Abnormal; Notable for the following components:      Result Value   Glucose, Bld 141 (*)    All other components within normal limits  PROTIME-INR  APTT  CBC  DIFFERENTIAL  CBG MONITORING, ED  POC URINE PREG, ED   ____________________________________________  EKG  ED ECG REPORT I, Lavonia Drafts, the attending physician, personally viewed and interpreted this ECG.  Date: 06/14/2019  Rhythm: normal sinus rhythm QRS Axis: normal Intervals: normal ST/T Wave abnormalities: normal Narrative Interpretation: no evidence of acute  ischemia  ____________________________________________  RADIOLOGY  CT head unremarkable MRI brain ____________________________________________   PROCEDURES  Procedure(s) performed: No  Procedures   Critical Care performed: No ____________________________________________   INITIAL IMPRESSION / ASSESSMENT AND PLAN / ED COURSE  Pertinent labs & imaging results that were available during my care of the patient were reviewed by me and considered in my medical decision making (see chart for details).  Patient presents with sensation of feeling off balance and describing that her right leg feels "off ".  She reports symptoms started yesterday.  Differential includes radiculopathy/vertigo, stroke.  Lab work today is quite reassuring, EKG is unremarkable.  Sent for CT head which is normal.  We will obtain MRI brain to rule out CVA  MRI brain does not fact show a acute lacunar infarct, will perform swallow study give aspirin p.o., have consulted the hospitalist for admission    ____________________________________________   FINAL CLINICAL IMPRESSION(S) / ED  DIAGNOSES  Final diagnoses:  Lacunar infarct, acute [facility])        Note:  This document was prepared using Dragon voice recognition software and may include unintentional dictation errors.   Lavonia Drafts, MD 06/14/19 1505

## 2019-06-15 ENCOUNTER — Observation Stay: Payer: Self-pay

## 2019-06-15 ENCOUNTER — Other Ambulatory Visit: Payer: Self-pay

## 2019-06-15 ENCOUNTER — Observation Stay (HOSPITAL_BASED_OUTPATIENT_CLINIC_OR_DEPARTMENT_OTHER)
Admit: 2019-06-15 | Discharge: 2019-06-15 | Disposition: A | Discharge status: Home / Self Care | Payer: Self-pay | Attending: Internal Medicine | Admitting: Internal Medicine

## 2019-06-15 DIAGNOSIS — I6389 Other cerebral infarction: Secondary | ICD-10-CM

## 2019-06-15 DIAGNOSIS — I6381 Other cerebral infarction due to occlusion or stenosis of small artery: Secondary | ICD-10-CM

## 2019-06-15 LAB — HIV ANTIBODY (ROUTINE TESTING W REFLEX): HIV Screen 4th Generation wRfx: NONREACTIVE

## 2019-06-15 LAB — GLUCOSE, CAPILLARY
Glucose-Capillary: 134 mg/dL — ABNORMAL HIGH (ref 70–99)
Glucose-Capillary: 129 mg/dL — ABNORMAL HIGH (ref 70–99)
Glucose-Capillary: 124 mg/dL — ABNORMAL HIGH (ref 70–99)
Glucose-Capillary: 143 mg/dL — ABNORMAL HIGH (ref 70–99)

## 2019-06-15 LAB — LIPID PANEL
Cholesterol: 177 mg/dL (ref 0–200)
HDL: 35 mg/dL — ABNORMAL LOW (ref >40)
LDL Cholesterol: 117 mg/dL — ABNORMAL HIGH (ref 0–99)
Total CHOL/HDL Ratio: 5.1 RATIO
Triglycerides: 127 mg/dL (ref <150)
VLDL: 25 mg/dL (ref 0–40)

## 2019-06-15 LAB — ECHOCARDIOGRAM COMPLETE
Height: 62 in
Weight: 3192.26 oz

## 2019-06-15 LAB — HEMOGLOBIN A1C
Hgb A1c MFr Bld: 8.1 % — ABNORMAL HIGH (ref 4.8–5.6)
Mean Plasma Glucose: 185.77 mg/dL

## 2019-06-15 IMAGING — US US CAROTID DUPLEX BILAT
1 series · 13 of 24 positions shown · non-contrast
Comparison: None.

CLINICAL DATA: 58-year-old female with a history of stroke symptoms

EXAM:
BILATERAL CAROTID DUPLEX ULTRASOUND
TECHNIQUE: Gray scale imaging, color Doppler and duplex ultrasound were
performed of bilateral carotid and vertebral arteries in the neck.

[Series 1: us carotid bilateral · 13 of 60 slices shown]
[im 1/60]
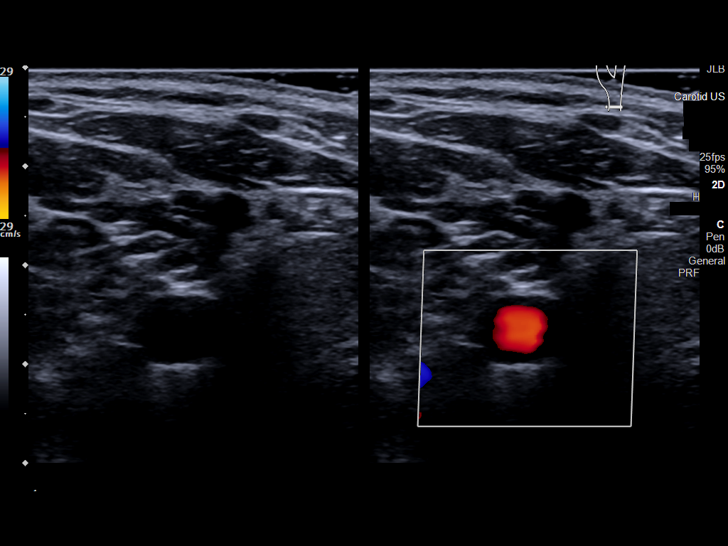
[im 6/60]
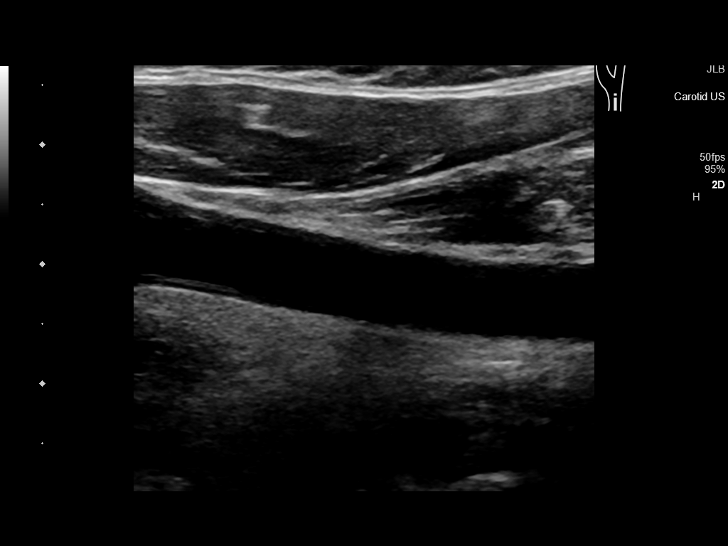
[im 11/60]
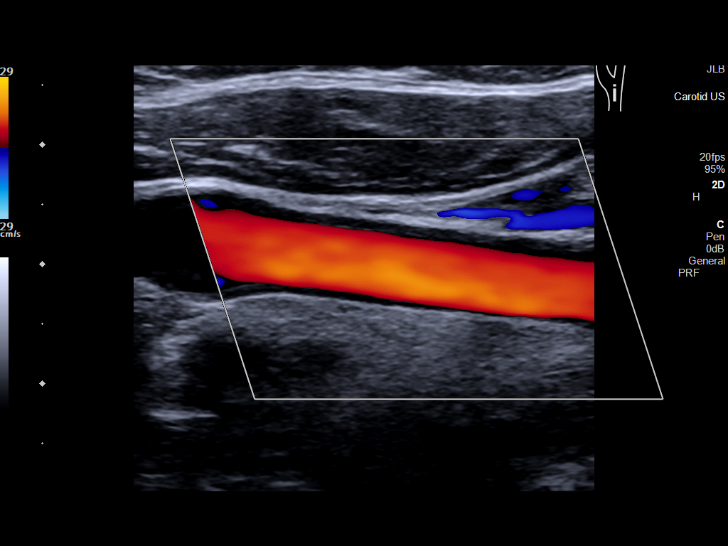
[im 16/60]
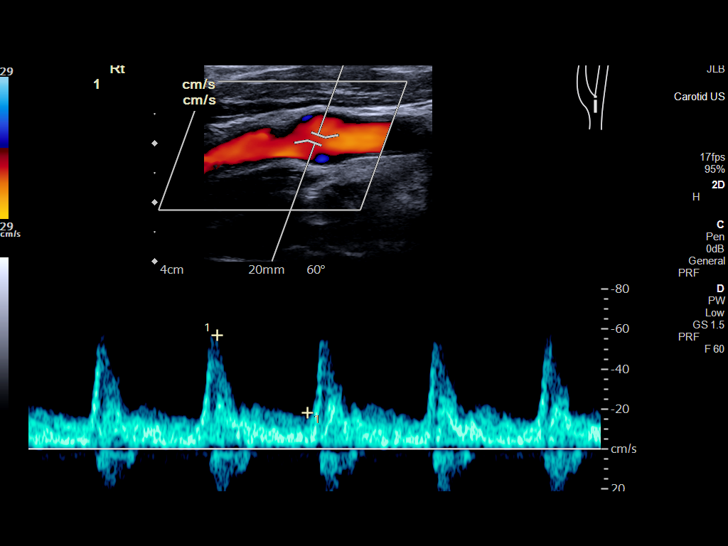
[im 21/60]
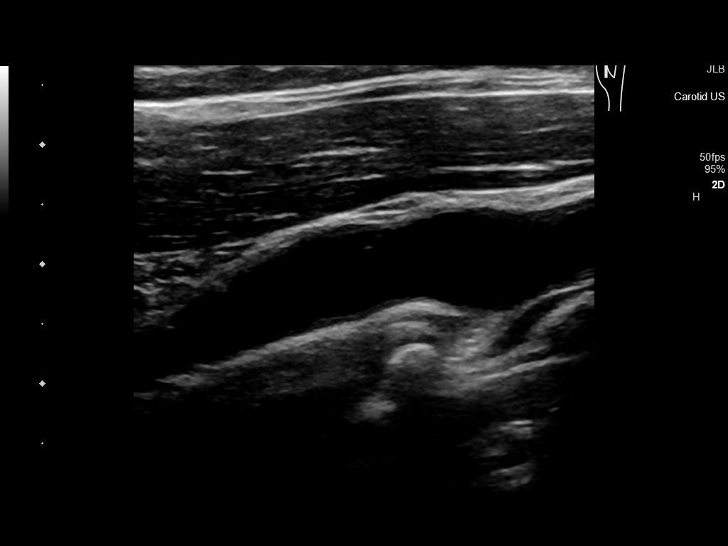
[im 26/60]
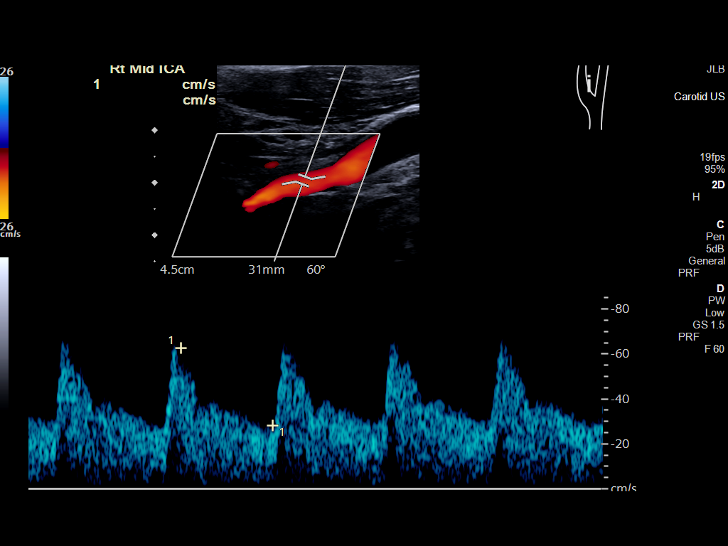
[im 31/60]
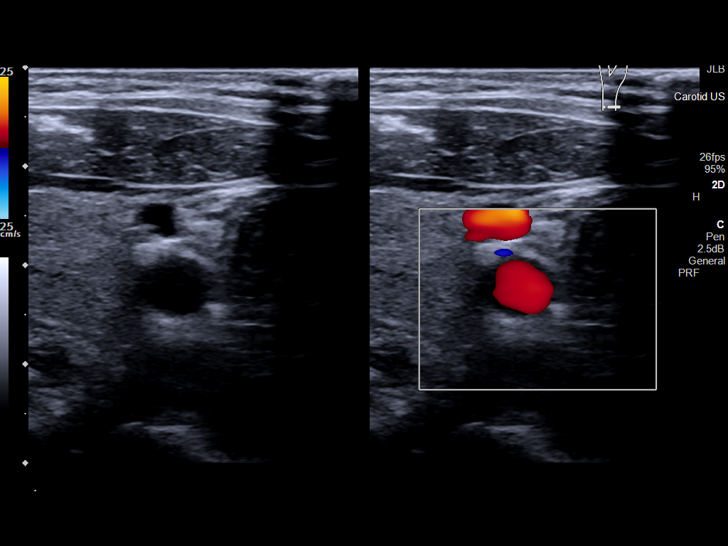
[im 34/60]
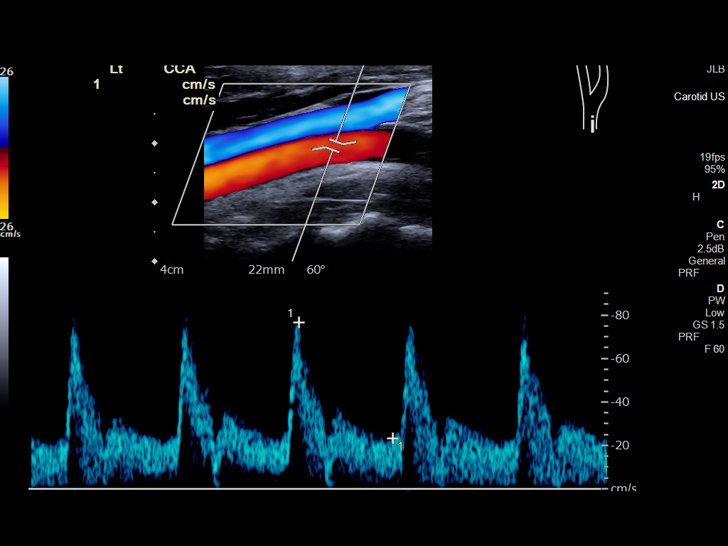
[im 39/60]
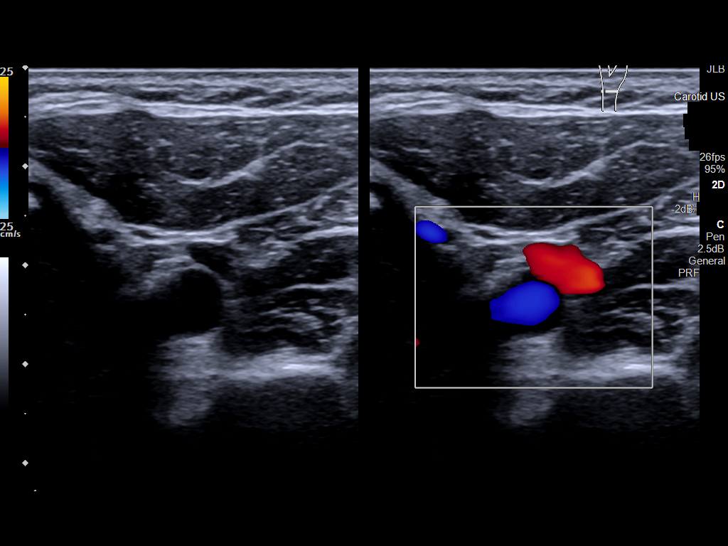
[im 44/60]
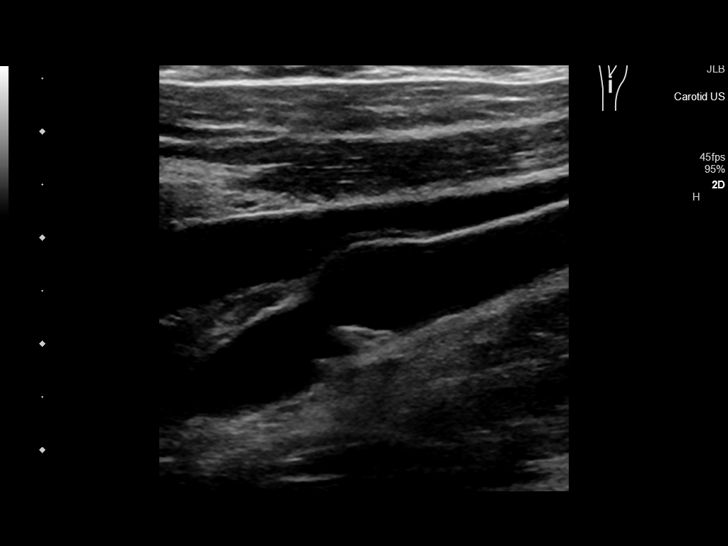
[im 49/60]
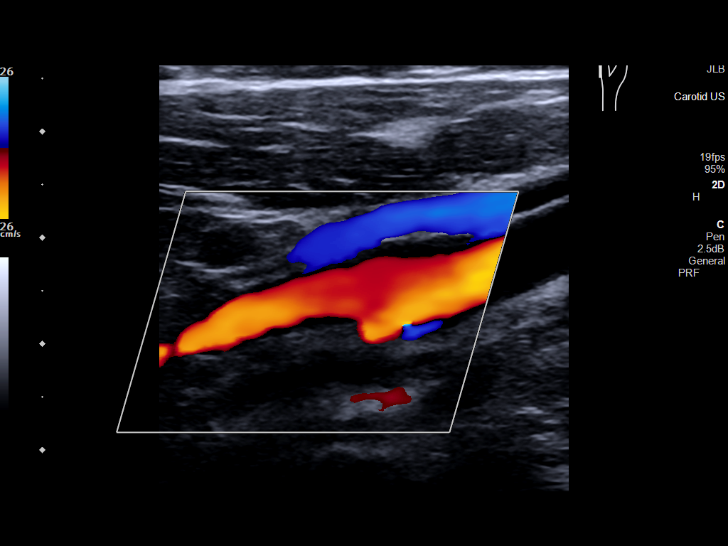
[im 54/60]
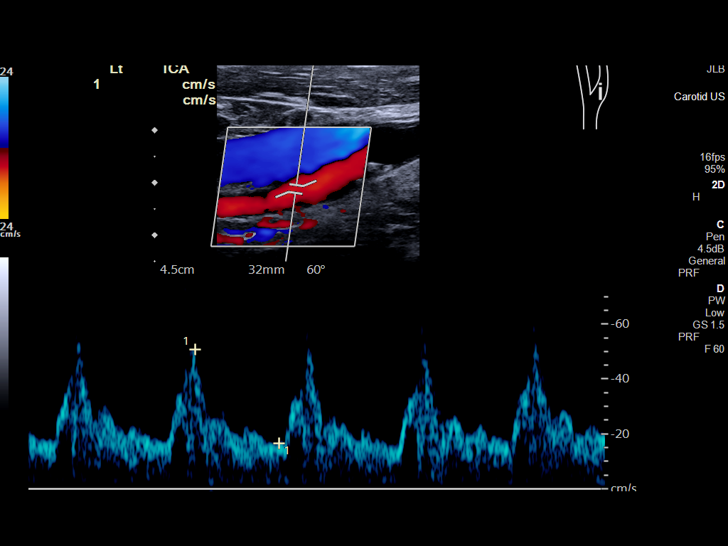
[im 60/60]
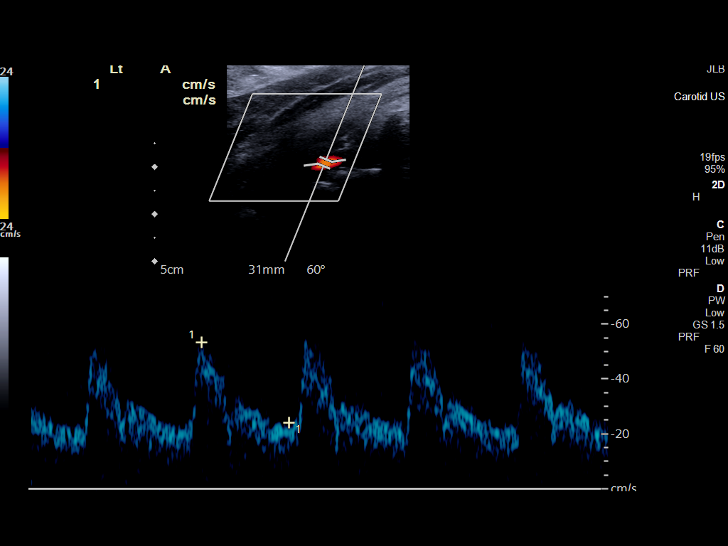

[13 of 24 positions shown; findings below may reference images not displayed]

FINDINGS: Criteria: Quantification of carotid stenosis is based on velocity
parameters that correlate the residual internal carotid diameter
with NASCET-based stenosis levels, using the diameter of the distal
internal carotid lumen as the denominator for stenosis measurement.

The following velocity measurements were obtained:

RIGHT

ICA:  Systolic 71 cm/sec, Diastolic 37 cm/sec

CCA:  77 cm/sec

SYSTOLIC ICA/CCA RATIO:

ECA:  83 cm/sec

LEFT

ICA:  Systolic 67 cm/sec, Diastolic 29 cm/sec

CCA:  90 cm/sec

SYSTOLIC ICA/CCA RATIO:

ECA:  72 cm/sec

Right Brachial SBP: Not acquired

Left Brachial SBP: Not acquired

RIGHT CAROTID ARTERY: No significant calcifications of the right
common carotid artery. Intermediate waveform maintained.
Heterogeneous and partially calcified plaque at the right carotid
bifurcation. No significant lumen shadowing. Low resistance waveform
of the right ICA. No significant tortuosity.

RIGHT VERTEBRAL ARTERY: Antegrade flow with low resistance waveform.

LEFT CAROTID ARTERY: No significant calcifications of the left
common carotid artery. Intermediate waveform maintained.
Heterogeneous and partially calcified plaque at the left carotid
bifurcation without significant lumen shadowing. Low resistance
waveform of the left ICA. No significant tortuosity.

LEFT VERTEBRAL ARTERY:  Antegrade flow with low resistance waveform.
IMPRESSION: Color duplex indicates minimal heterogeneous and calcified plaque,
with no hemodynamically significant stenosis by duplex criteria in
the extracranial cerebrovascular circulation.

## 2019-06-15 MED ORDER — CLOPIDOGREL BISULFATE 75 MG PO TABS
75.0000 mg | ORAL_TABLET | Freq: Every day | ORAL | 0 refills | Status: DC
Start: 2019-06-15 — End: 2019-11-02

## 2019-06-15 NOTE — Discharge Summary (Signed)
Physician Discharge Summary  Angelica [name] [id number] DOB: 04-10-1960 DOA: 06/14/2019  PCP: Tawni Millers, MD  Admit date: 06/14/2019 Discharge date: 06/15/2019  Admitted From: Home Disposition:  Home  Recommendations for Outpatient Follow-up:  1. Follow up with PCP in 1-2 weeks 2. Please obtain BMP/CBC in one week 3. Please follow up on the following pending results:None  Home Health:Yes Equipment/Devices: None Discharge Condition: Stable CODE STATUS: Full Diet recommendation: Heart Healthy / Carb Modified    Brief/Interim Summary: Angelica [name] is a 59 y.o. female with medical history significant of hypertension, hyperlipidemia, diabetes mellitus, GERD, Bell's palsy, COVID-19 infection 12/28/2018, who presents with dizziness, right leg weakness, right hand numbness.  Patient states that her symptoms started yesterday at about 4;30 PM, including dizziness, right leg weakness and right hand numbness.  Patient has history of Bell's palsy with chronic right sided facial droop.  No vision change or hearing loss.  No difficulty speaking or swallowing.  MRI was positive for an acute lacunar infarct in the posterior limb of the left internal capsule.  Neurology was consulted.  She was out of window for TPA  Underwent stroke work-up which shows echo with normal EF, grade 1 diastolic dysfunction, ultrasound carotid without any significant stenosis.  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  Lipid panel with LDL 117 with goal less than 70.  Her ASCVD risk was 38.3% which can be reduced to 2% with risk management. She was advised to take dual antiplatelets with aspirin and Plavix for 3 days and after that she will continue with Plavix only.  She needs to follow-up with neurology as an outpatient.  She did not had any residual deficit.  Neuro exam was normal except mildly decreased sensations on right to digits.  She has uncontrolled diabetes with A1c of 8.1.  She needs a close follow-up with  PCP for better control of her diabetes to improved her risk factors for any future CVA.  She will continue with rest of her home meds and follow-up with her primary care physician.   Discharge Diagnoses:  Principal Problem:   Stroke [facility]) Active Problems:   Diabetes mellitus without complication (HCC)   GERD (gastroesophageal reflux disease)   Hypertension   Hyperlipidemia   Lacunar infarct, acute [facility])  Discharge Instructions  Discharge Instructions    Diet - low sodium heart healthy   Complete by: As directed    Discharge instructions   Complete by: As directed    It was pleasure taking care of you. Please take aspirin and Plavix both for 3 weeks and then stop taking aspirin, you will continue with Plavix and Lipitor and follow-up with neurology and your primary care physician.   Increase activity slowly   Complete by: As directed      Allergies as of 06/15/2019   No Known Allergies     Medication List    TAKE these medications   aspirin EC 81 MG tablet Take 1 tablet (81 mg total) by mouth daily.   atorvastatin 40 MG tablet Commonly known as: LIPITOR Take 1 tablet (40 mg total) by mouth daily at 6 PM.   clopidogrel 75 MG tablet Commonly known as: PLAVIX Take 1 tablet (75 mg total) by mouth daily.   hydrochlorothiazide 12.5 MG tablet Commonly known as: HYDRODIURIL Take 1 tablet (12.5 mg total) by mouth 2 (two) times daily.   Lantus SoloStar 100 UNIT/ML Solostar Pen Generic drug: insulin glargine Inject 36 Units into the skin daily at 10  pm. Increase by 4 units every 3 days if fasting blood glucose is > 180 mg/dl. What changed:   how much to take  additional instructions   metFORMIN 500 MG 24 hr tablet Commonly known as: GLUCOPHAGE-XR Take 2 tablets (1,000 mg total) by mouth daily with breakfast AND 2 tablets (1,000 mg total) daily before supper.   omeprazole 20 MG capsule Commonly known as: PRILOSEC Take 1 capsule (20 mg total) by mouth daily.    Ozempic (0.25 or 0.5 MG/DOSE) 2 MG/1.5ML Sopn Generic drug: Semaglutide(0.25 or 0.5MG /DOS) Inject 0.5 mg into the skin once a week.      Follow-up Information    Tawni Millers, MD. Schedule an appointment as soon as possible for a visit.   Specialty: Internal Medicine Contact information: [address] [place] [address] [phone]          No Known Allergies  Consultations:  Neurology  Procedures/Studies: CT HEAD WO CONTRAST  Result Date: 06/14/2019 CLINICAL DATA:  Dizziness for 1 day EXAM: CT HEAD WITHOUT CONTRAST TECHNIQUE: Contiguous axial images were obtained from the base of the skull through the vertex without intravenous contrast. COMPARISON:  None. FINDINGS: Brain: No evidence of acute infarction, hemorrhage, hydrocephalus, extra-axial collection or mass lesion/mass effect. Vascular: No hyperdense vessel or unexpected calcification. Skull: Normal. Negative for fracture or focal lesion. Sinuses/Orbits: No acute finding. Other: None. IMPRESSION: Normal head CT for age Electronically Signed   By: Inez Catalina M.D.   On: 06/14/2019 10:23   MR ANGIO HEAD WO CONTRAST  Result Date: 06/14/2019 CLINICAL DATA:  Stroke follow-up. Left internal capsule infarct on MRI. EXAM: MRA HEAD WITHOUT CONTRAST TECHNIQUE: Angiographic images of the Circle of Willis were obtained using MRA technique without intravenous contrast. COMPARISON:  None. FINDINGS: The visualized distal vertebral arteries are patent to the basilar. The left vertebral artery is strongly dominant without stenosis. An apparent 7 mm long segment of severe narrowing involving the proximal right V4 segment may be artifactual signal loss secondary to small vessel size based on the source images although an underlying stenosis is also possible. Patent right PICA, bilateral AICA, and bilateral SCA origins are visualized. The basilar artery is widely patent. Both PCAs are patent without evidence of a significant  stenosis on the right. There is a severe mid left P2 stenosis. The internal carotid arteries are widely patent from skull base to carotid termini. ACAs and MCAs are patent without evidence of a proximal branch occlusion or a significant proximal stenosis. No aneurysm is identified. IMPRESSION: 1. No large vessel occlusion. 2. Severe mid left P2 stenosis. 3. V4 segment artifact versus stenosis in the non-dominant right vertebral artery. Electronically Signed   By: Logan Bores M.D.   On: 06/14/2019 19:33   MR BRAIN WO CONTRAST  Result Date: 06/14/2019 CLINICAL DATA:  Dizziness for 1 day. Numbness involving the lips as well as right thumb, index finger, and long finger. EXAM: MRI HEAD WITHOUT CONTRAST TECHNIQUE: Multiplanar, multiecho pulse sequences of the brain and surrounding structures were obtained without intravenous contrast. COMPARISON:  Head CT 06/14/2019 FINDINGS: Brain: There is a subcentimeter acute infarct in the posterior limb of the left internal capsule. A few small scattered foci of T2 hyperintensity elsewhere in the cerebral white matter are nonspecific but compatible with minimal chronic small vessel ischemic disease. The ventricles and sulci are normal. No intracranial hemorrhage, mass, midline shift, or extra-axial fluid collection is identified. There is a mildly expanded partially empty sella. Vascular: Major  intracranial vascular flow voids are preserved. Skull and upper cervical spine: Unremarkable bone marrow signal para Sinuses/Orbits: Unremarkable orbits. Small right maxillary sinus mucous retention cyst. Clear mastoid air cells. Other: None. 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 Electronically Signed   By: Logan Bores M.D.   On: 06/14/2019 14:55   US Carotid Bilateral (at [facility] and [facility] only)  Result Date: 06/15/2019 CLINICAL DATA:  59 year old female with a history of stroke symptoms EXAM: BILATERAL CAROTID DUPLEX ULTRASOUND TECHNIQUE:  Pearline Cables scale imaging, color Doppler and duplex ultrasound were performed of bilateral carotid and vertebral arteries in the neck. COMPARISON:  None. FINDINGS: Criteria: Quantification of carotid stenosis is based on velocity parameters that correlate the residual internal carotid diameter with NASCET-based stenosis levels, using the diameter of the distal internal carotid lumen as the denominator for stenosis measurement. The following velocity measurements were obtained: RIGHT ICA:  Systolic 71 cm/sec, Diastolic 37 cm/sec CCA:  77 cm/sec SYSTOLIC ICA/CCA RATIO:  0.9 ECA:  83 cm/sec LEFT ICA:  Systolic 67 cm/sec, Diastolic 29 cm/sec CCA:  90 cm/sec SYSTOLIC ICA/CCA RATIO:  0.7 ECA:  72 cm/sec Right Brachial SBP: Not acquired Left Brachial SBP: Not acquired RIGHT CAROTID ARTERY: No significant calcifications of the right common carotid artery. Intermediate waveform maintained. Heterogeneous and partially calcified plaque at the right carotid bifurcation. No significant lumen shadowing. Low resistance waveform of the right ICA. No significant tortuosity. RIGHT VERTEBRAL ARTERY: Antegrade flow with low resistance waveform. LEFT CAROTID ARTERY: No significant calcifications of the left common carotid artery. Intermediate waveform maintained. Heterogeneous and partially calcified plaque at the left carotid bifurcation without significant lumen shadowing. Low resistance waveform of the left ICA. No significant tortuosity. LEFT VERTEBRAL ARTERY:  Antegrade flow with low resistance waveform. IMPRESSION: Color duplex indicates minimal heterogeneous and calcified plaque, with no hemodynamically significant stenosis by duplex criteria in the extracranial cerebrovascular circulation. Signed, Dulcy Fanny. Dellia Nims, RPVI [facility] Electronically Signed   By: Corrie Mckusick D.O.   On: 06/15/2019 14:43   ECHOCARDIOGRAM COMPLETE  Result Date: 06/15/2019    ECHOCARDIOGRAM  REPORT   Patient Name:   Angelica [name] Date of Exam: 06/15/2019 Medical Rec #:  [id number]       Height:       62.0 in Accession #:    [id number]      Weight:       199.5 lb Date of Birth:  05/05/1960      BSA:          1.910 m Patient Age:    71 years        BP:           150/73 mmHg Patient Gender: F               HR:           96 bpm. Exam Location:  [facility] Procedure: 2D Echo, Color Doppler and Cardiac Doppler Indications:     I163.9 Stroke  History:         Patient has no prior history of Echocardiogram examinations.                  Risk Factors:Hypertension, Diabetes and Dyslipidemia. Pt tested                  positive for COVID-19 on 12/28/2018.  Sonographer:     Charmayne Sheer RDCS (AE) Referring Phys:  [phone] [facility]  Alyssamae Klinck Diagnosing Phys: Harrell Gave End MD  Sonographer Comments: Suboptimal apical window and no subcostal window. Image acquisition challenging due to patient body habitus. IMPRESSIONS  1. Left ventricular ejection fraction, by estimation, is 60 to 65%. The left ventricle has normal function. Left ventricular endocardial border not optimally defined to evaluate regional wall motion. There is mild left ventricular hypertrophy. Left ventricular diastolic parameters are consistent with Grade I diastolic dysfunction (impaired relaxation).  2. Right ventricular systolic function is normal. The right ventricular size is normal. Mildly increased right ventricular wall thickness.  3. There is a small to moderate pericardial effusion. The pericardial effusion is anterior to the right ventricle. There is no evidence of cardiac tamponade.  4. The mitral valve is normal in structure. Trivial mitral valve regurgitation. No evidence of mitral stenosis.  5. The aortic valve is tricuspid. Aortic valve regurgitation is not visualized. No aortic stenosis is present. FINDINGS  Left Ventricle: Left ventricular ejection fraction, by estimation, is 60 to 65%. The left ventricle has normal function. Left ventricular  endocardial border not optimally defined to evaluate regional wall motion. The left ventricular internal cavity size was normal in size. There is mild left ventricular hypertrophy. Left ventricular diastolic parameters are consistent with Grade I diastolic dysfunction (impaired relaxation). Right Ventricle: The right ventricular size is normal. Mildly increased right ventricular wall thickness. Right ventricular systolic function is normal. Left Atrium: Left atrial size was normal in size. Right Atrium: Right atrial size was normal in size. Pericardium: There is a small to moderate pericardial effusion. The pericardial effusion is anterior to the right ventricle. There is no evidence of cardiac tamponade. Mitral Valve: The mitral valve is normal in structure. Trivial mitral valve regurgitation. No evidence of mitral valve stenosis. MV peak gradient, 3.5 mmHg. The mean mitral valve gradient is 2.0 mmHg. Tricuspid Valve: The tricuspid valve is normal in structure. Tricuspid valve regurgitation is not demonstrated. Aortic Valve: The aortic valve is tricuspid. Aortic valve regurgitation is not visualized. No aortic stenosis is present. Aortic valve mean gradient measures 4.0 mmHg. Aortic valve peak gradient measures 7.7 mmHg. Aortic valve area, by VTI measures 1.26 cm. Pulmonic Valve: The pulmonic valve was not well visualized. Pulmonic valve regurgitation is trivial. No evidence of pulmonic stenosis. Aorta: The aortic root is normal in size and structure. Pulmonary Artery: The pulmonary artery is not well seen. Venous: The inferior vena cava was not well visualized. IAS/Shunts: The interatrial septum was not well visualized.  LEFT VENTRICLE PLAX 2D LVIDd:         3.65 cm  Diastology LVIDs:         2.42 cm  LV e' lateral:   4.57 cm/s LV PW:         1.18 cm  LV E/e' lateral: 13.5 LV IVS:        1.09 cm  LV e' medial:    4.79 cm/s LVOT diam:     1.70 cm  LV E/e' medial:  12.9 LV SV:         27 LV SV Index:   14 LVOT  Area:     2.27 cm  LEFT ATRIUM           Index       RIGHT ATRIUM          Index LA diam:      3.20 cm 1.68 cm/m  RA Area:     9.66 cm LA Vol (A2C): 22.4 ml 11.73 ml/m RA Volume:  17.50 ml 9.16 ml/m LA Vol (A4C): 23.2 ml 12.15 ml/m  AORTIC VALVE                    PULMONIC VALVE AV Area (Vmax):    1.30 cm     PV Vmax:       0.68 m/s AV Area (Vmean):   1.06 cm     PV Vmean:      42.800 cm/s AV Area (VTI):     1.26 cm     PV VTI:        0.095 m AV Vmax:           139.00 cm/s  PV Peak grad:  1.9 mmHg AV Vmean:          100.000 cm/s PV Mean grad:  1.0 mmHg AV VTI:            0.216 m AV Peak Grad:      7.7 mmHg AV Mean Grad:      4.0 mmHg LVOT Vmax:         79.60 cm/s LVOT Vmean:        46.900 cm/s LVOT VTI:          0.120 m LVOT/AV VTI ratio: 0.56  AORTA Ao Root diam: 2.80 cm MITRAL VALVE MV Area (PHT): 4.99 cm    SHUNTS MV Peak grad:  3.5 mmHg    Systemic VTI:  0.12 m MV Mean grad:  2.0 mmHg    Systemic Diam: 1.70 cm MV Vmax:       0.94 m/s MV Vmean:      67.6 cm/s MV Decel Time: 152 msec MV E velocity: 61.70 cm/s MV A velocity: 75.80 cm/s MV E/A ratio:  0.81 Christopher End MD Electronically signed by Nelva Bush MD Signature Date/Time: 06/15/2019/11:53:22 AM    Final      Subjective: She has no new complaints.  She continues to have mild tingling in right 2 digits.  She was accompanied by her son in the room.  Discharge Exam: Vitals:   06/15/19 0748 06/15/19 1432  BP: (!) 150/73 109/75  Pulse: 97 84  Resp: 17 16  Temp: 98.8 F (37.1 C) 98.3 F (36.8 C)  SpO2: 99% 96%   Vitals:   06/14/19 2004 06/14/19 2005 06/15/19 0748 06/15/19 1432  BP:  (!) 141/93 (!) 150/73 109/75  Pulse:  94 97 84  Resp:  18 17 16   Temp:  98.6 F (37 C) 98.8 F (37.1 C) 98.3 F (36.8 C)  TempSrc:  Oral Oral   SpO2:  99% 99% 96%  Weight: 90.5 kg     Height: 5\' 2"  (1.575 m)       General: Pt is alert, awake, not in acute distress Cardiovascular: RRR, S1/S2 +, no rubs, no gallops Respiratory: CTA  bilaterally, no wheezing, no rhonchi Abdominal: Soft, NT, ND, bowel sounds + Extremities: no edema, no cyanosis   The results of significant diagnostics from this hospitalization (including imaging, microbiology, ancillary and laboratory) are listed below for reference.    Microbiology: Recent Results (from the past 240 hour(s))  SARS Coronavirus 2 by RT PCR (hospital order, performed in [facility] hospital lab) Nasopharyngeal Nasopharyngeal Swab     Status: None   Collection Time: 06/14/19  5:10 PM   Specimen: Nasopharyngeal Swab  Result Value Ref Range Status   SARS Coronavirus 2 NEGATIVE NEGATIVE Final    Comment: (NOTE) SARS-CoV-2 target nucleic acids are NOT DETECTED. The SARS-CoV-2 RNA  is generally detectable in upper and lower respiratory specimens during the acute phase of infection. The lowest concentration of SARS-CoV-2 viral copies this assay can detect is 250 copies / mL. A negative result does not preclude SARS-CoV-2 infection and should not be used as the sole basis for treatment or other patient management decisions.  A negative result may occur with improper specimen collection / handling, submission of specimen other than nasopharyngeal swab, presence of viral mutation(s) within the areas targeted by this assay, and inadequate number of viral copies (<250 copies / mL). A negative result must be combined with clinical observations, patient history, and epidemiological information. Fact Sheet for Patients:   StrictlyIdeas.no Fact Sheet for Healthcare Providers: BankingDealers.co.za This test is not yet approved or cleared  by the [place] FDA and has been authorized for detection and/or diagnosis of SARS-CoV-2 by FDA under an Emergency Use Authorization (EUA).  This EUA will remain in effect (meaning this test can be used) for the duration of the COVID-19 declaration under Section 564(b)(1) of the Act, 21  U.S.C. section 360bbb-3(b)(1), unless the authorization is terminated or revoked sooner. Performed at [facility], [address] [place], [place] [address]      Labs: BNP (last 3 results) No results for input(s): BNP in the last 8760 hours. Basic Metabolic Panel: Recent Labs  Lab 06/14/19 1006  NA 139  K 3.8  CL 106  CO2 26  GLUCOSE 141*  BUN 12  CREATININE 0.61  CALCIUM 9.2   Liver Function Tests: Recent Labs  Lab 06/14/19 1006  AST 18  ALT 23  ALKPHOS 113  BILITOT 0.6  PROT 8.0  ALBUMIN 3.8   No results for input(s): LIPASE, AMYLASE in the last 168 hours. No results for input(s): AMMONIA in the last 168 hours. CBC: Recent Labs  Lab 06/14/19 1006  WBC 6.9  NEUTROABS 3.7  HGB 12.9  HCT 39.2  MCV 80.5  PLT 338   Cardiac Enzymes: No results for input(s): CKTOTAL, CKMB, CKMBINDEX, TROPONINI in the last 168 hours. BNP: Invalid input(s): POCBNP CBG: Recent Labs  Lab 06/14/19 2032 06/14/19 2347 06/15/19 0308 06/15/19 0746 06/15/19 1206  GLUCAP 142* 151* 134* 129* 124*   D-Dimer No results for input(s): DDIMER in the last 72 hours. Hgb A1c Recent Labs    06/15/19 0535  HGBA1C 8.1*   Lipid Profile Recent Labs    06/15/19 0535  CHOL 177  HDL 35*  LDLCALC 117*  TRIG 127  CHOLHDL 5.1   Thyroid function studies No results for input(s): TSH, T4TOTAL, T3FREE, THYROIDAB in the last 72 hours.  Invalid input(s): FREET3 Anemia work up No results for input(s): VITAMINB12, FOLATE, FERRITIN, TIBC, IRON, RETICCTPCT in the last 72 hours. Urinalysis    Component Value Date/Time   COLORURINE YELLOW (A) 10/05/2014 1300   APPEARANCEUR Clear 05/05/2019 1133   LABSPEC 1.038 (H) 10/05/2014 1300   LABSPEC 1.028 05/30/2013 1905   PHURINE 5.0 10/05/2014 1300   GLUCOSEU Negative 05/05/2019 1133   GLUCOSEU Negative 05/30/2013 1905   HGBUR 1+ (A) 10/05/2014 1300   BILIRUBINUR Negative 05/05/2019 1133   BILIRUBINUR Negative 05/30/2013 1905    KETONESUR 2+ (A) 10/05/2014 1300   PROTEINUR Negative 05/05/2019 1133   PROTEINUR NEGATIVE 10/05/2014 1300   NITRITE Negative 05/05/2019 1133   NITRITE NEGATIVE 10/05/2014 1300   LEUKOCYTESUR Negative 05/05/2019 1133   LEUKOCYTESUR Negative 05/30/2013 1905   Sepsis Labs Invalid input(s): PROCALCITONIN,  WBC,  LACTICIDVEN Microbiology Recent Results (from the past  240 hour(s))  SARS Coronavirus 2 by RT PCR (hospital order, performed in [facility] hospital lab) Nasopharyngeal Nasopharyngeal Swab     Status: None   Collection Time: 06/14/19  5:10 PM   Specimen: Nasopharyngeal Swab  Result Value Ref Range Status   SARS Coronavirus 2 NEGATIVE NEGATIVE Final    Comment: (NOTE) SARS-CoV-2 target nucleic acids are NOT DETECTED. The SARS-CoV-2 RNA is generally detectable in upper and lower respiratory specimens during the acute phase of infection. The lowest concentration of SARS-CoV-2 viral copies this assay can detect is 250 copies / mL. A negative result does not preclude SARS-CoV-2 infection and should not be used as the sole basis for treatment or other patient management decisions.  A negative result may occur with improper specimen collection / handling, submission of specimen other than nasopharyngeal swab, presence of viral mutation(s) within the areas targeted by this assay, and inadequate number of viral copies (<250 copies / mL). A negative result must be combined with clinical observations, patient history, and epidemiological information. Fact Sheet for Patients:   StrictlyIdeas.no Fact Sheet for Healthcare Providers: BankingDealers.co.za This test is not yet approved or cleared  by the [place] FDA and has been authorized for detection and/or diagnosis of SARS-CoV-2 by FDA under an Emergency Use Authorization (EUA).  This EUA will remain in effect (meaning this test can be used) for the duration of the COVID-19  declaration under Section 564(b)(1) of the Act, 21 U.S.C. section 360bbb-3(b)(1), unless the authorization is terminated or revoked sooner. Performed at [facility], [address] [place], [place] [address]     Time coordinating discharge: Over 30 minutes  SIGNED:  Lorella Nimrod, MD  [facility] 06/15/2019, 3:05 PM  If 7PM-7AM, please contact night-coverage [email or web address]  This record has been created using Systems analyst. Errors have been sought and corrected,but may not always be located. Such creation errors do not reflect on the standard of care.

## 2019-06-15 NOTE — Consult Note (Signed)
Requesting Physician: Reesa Chew    Chief Complaint: Right sided numbness and weakness  I have been asked by Dr. Reesa Chew to see this patient in consultation for Acute infarct.  HPI: Angelica [name] is an 59 y.o. female with medical history significant of hypertension, hyperlipidemia, diabetes mellitus, Bell's palsy, COVID-19 infection 12/28/2018, who presented on yesterday with complaints of right leg weakness, right hand numbness.  Symptoms started on 5/23.  Did no resolve overnight therefore patient presented for evaluation.  Initial NIHSS of 2.  Takes ASA 3-4 times per week.    Date last known well: 06/13/2019 Time last known well: Time: 16:30 tPA Given: No: Outside time window  Past Medical History:  Diagnosis Date  . Bell palsy 08/04/2014  . GERD (gastroesophageal reflux disease) 01/27/2015  . History of Bell's palsy June 2016  . Hyperlipidemia 01/27/2015  . Hypertension 01/27/2015  . Type 2 diabetes mellitus ([place]) 08/04/2014    Past Surgical History:  Procedure Laterality Date  . ABDOMINAL HYSTERECTOMY    . COLONOSCOPY WITH PROPOFOL N/A 10/07/2018   Procedure: COLONOSCOPY WITH PROPOFOL;  Surgeon: Virgel Manifold, MD;  Location: [facility] ENDOSCOPY;  Service: Gastroenterology;  Laterality: N/A;  . OOPHORECTOMY Right 2005    Family History  Problem Relation Age of Onset  . Cancer Brother        Colon Cancer  . Breast cancer Neg Hx    Social History:  reports that she has never smoked. She has never used smokeless tobacco. She reports that she does not drink alcohol or use drugs.  Allergies: No Known Allergies  Medications:  I have reviewed the patient's current medications. Prior to Admission:  Medications Prior to Admission  Medication Sig Dispense Refill Last Dose  . aspirin EC 81 MG tablet Take 1 tablet (81 mg total) by mouth daily. 90 tablet 3 06/13/2019 at 0730  . atorvastatin (LIPITOR) 40 MG tablet Take 1 tablet (40 mg total) by mouth daily at 6 PM. 90 tablet 3 06/13/2019 at 1800   . hydrochlorothiazide (HYDRODIURIL) 12.5 MG tablet Take 1 tablet (12.5 mg total) by mouth 2 (two) times daily. 90 tablet 3 06/14/2019 at 0830  . metFORMIN (GLUCOPHAGE-XR) 500 MG 24 hr tablet Take 2 tablets (1,000 mg total) by mouth daily with breakfast AND 2 tablets (1,000 mg total) daily before supper. 270 tablet 3 06/13/2019 at 1700  . omeprazole (PRILOSEC) 20 MG capsule Take 1 capsule (20 mg total) by mouth daily. 90 capsule 3 06/13/2019 at 2000  . Semaglutide (OZEMPIC) 0.25 or 0.5 MG/DOSE SOPN Inject 0.5 mg into the skin once a week.   06/14/2019 at 0830  . Insulin Glargine (LANTUS SOLOSTAR) 100 UNIT/ML Solostar Pen Inject 36 Units into the skin daily at 10 pm. Increase by 4 units every 3 days if fasting blood glucose is > 180 mg/dl. (Patient taking differently: Inject 40 Units into the skin daily at 10 pm. ) 30 mL 3 06/12/2019 at 2200   Scheduled: . aspirin EC  81 mg Oral Daily  . atorvastatin  40 mg Oral q1800  . enoxaparin (LOVENOX) injection  40 mg Subcutaneous Q24H  . hydrochlorothiazide  12.5 mg Oral BID  . insulin aspart  0-9 Units Subcutaneous Q4H  . insulin glargine  20 Units Subcutaneous Q2200  . pantoprazole  40 mg Oral Daily  . sodium chloride flush  3 mL Intravenous Once    ROS: History obtained from the patient  General ROS: negative for - chills, fatigue, fever, night sweats, weight gain or  weight loss Psychological ROS: negative for - behavioral disorder, hallucinations, memory difficulties, mood swings or suicidal ideation Ophthalmic ROS: negative for - blurry vision, double vision, eye pain or loss of vision ENT ROS: negative for - epistaxis, nasal discharge, oral lesions, sore throat, tinnitus or vertigo Allergy and Immunology ROS: negative for - hives or itchy/watery eyes Hematological and Lymphatic ROS: negative for - bleeding problems, bruising or swollen lymph nodes Endocrine ROS: negative for - galactorrhea, hair pattern changes, polydipsia/polyuria or temperature  intolerance Respiratory ROS: negative for - cough, hemoptysis, shortness of breath or wheezing Cardiovascular ROS: negative for - chest pain, dyspnea on exertion, edema or irregular heartbeat Gastrointestinal ROS: negative for - abdominal pain, diarrhea, hematemesis, nausea/vomiting or stool incontinence Genito-Urinary ROS: negative for - dysuria, hematuria, incontinence or urinary frequency/urgency Musculoskeletal ROS: negative for - joint swelling or muscular weakness Neurological ROS: as noted in HPI Dermatological ROS: negative for rash and skin lesion changes  Physical Examination: Blood pressure (!) 150/73, pulse 97, temperature 98.8 F (37.1 C), resp. rate 17, height 5\' 2"  (1.575 m), weight 90.5 kg, last menstrual period 03/11/2013, SpO2 99 %.  HEENT-  Normocephalic, no lesions, without obvious abnormality.  Normal external eye and conjunctiva.  Normal TM's bilaterally.  Normal auditory canals and external ears. Normal external nose, mucus membranes and septum.  Normal pharynx. Cardiovascular- S1, S2 normal, pulses palpable throughout   Lungs- chest clear, no wheezing, rales, normal symmetric air entry Abdomen- soft, non-tender; bowel sounds normal; no masses,  no organomegaly Extremities- no edema Lymph-no adenopathy palpable Musculoskeletal-no joint tenderness, deformity or swelling Skin-warm and dry, no hyperpigmentation, vitiligo, or suspicious lesions  Neurological Examination   Mental Status: Alert, oriented, thought content appropriate.  Speech fluent without evidence of aphasia.  Mild dysarthria.  Able to follow 3 step commands without difficulty. Cranial Nerves: II: Visual fields grossly normal, pupils equal, round, reactive to light and accommodation III,IV, VI: ptosis not present, extra-ocular motions intact bilaterally V,VII: decrease in right NLF, facial light touch sensation normal bilaterally VIII: hearing normal bilaterally IX,X: gag reflex present XI: bilateral  shoulder shrug XII: midline tongue extension Motor: Right : Upper extremity   5/5    Left:     Upper extremity   5/5  Lower extremity   4/5     Lower extremity   5/5 Tone and bulk:normal tone throughout; no atrophy noted Sensory: Pinprick and light touch decreased in the right fingertips Deep Tendon Reflexes: Symmetric throughout Plantars: Right: mute   Left: mute Cerebellar: Normal finger-to-nose and normal heel-to-shin testing bilaterally Gait: not tested due to safety concerns    Laboratory Studies:  Basic Metabolic Panel: Recent Labs  Lab 06/14/19 1006  NA 139  K 3.8  CL 106  CO2 26  GLUCOSE 141*  BUN 12  CREATININE 0.61  CALCIUM 9.2    Liver Function Tests: Recent Labs  Lab 06/14/19 1006  AST 18  ALT 23  ALKPHOS 113  BILITOT 0.6  PROT 8.0  ALBUMIN 3.8   No results for input(s): LIPASE, AMYLASE in the last 168 hours. No results for input(s): AMMONIA in the last 168 hours.  CBC: Recent Labs  Lab 06/14/19 1006  WBC 6.9  NEUTROABS 3.7  HGB 12.9  HCT 39.2  MCV 80.5  PLT 338    Cardiac Enzymes: No results for input(s): CKTOTAL, CKMB, CKMBINDEX, TROPONINI in the last 168 hours.  BNP: Invalid input(s): POCBNP  CBG: Recent Labs  Lab 06/14/19 1653 06/14/19 2032 06/14/19 2347 06/15/19 0308 06/15/19  [place]    Microbiology: Results for orders placed or performed during the hospital encounter of 06/14/19  SARS Coronavirus 2 by RT PCR (hospital order, performed in [facility] hospital lab) Nasopharyngeal Nasopharyngeal Swab     Status: None   Collection Time: 06/14/19  5:10 PM   Specimen: Nasopharyngeal Swab  Result Value Ref Range Status   SARS Coronavirus 2 NEGATIVE NEGATIVE Final    Comment: (NOTE) SARS-CoV-2 target nucleic acids are NOT DETECTED. The SARS-CoV-2 RNA is generally detectable in upper and lower respiratory specimens during the acute phase of infection. The lowest concentration of SARS-CoV-2 viral  copies this assay can detect is 250 copies / mL. A negative result does not preclude SARS-CoV-2 infection and should not be used as the sole basis for treatment or other patient management decisions.  A negative result may occur with improper specimen collection / handling, submission of specimen other than nasopharyngeal swab, presence of viral mutation(s) within the areas targeted by this assay, and inadequate number of viral copies (<250 copies / mL). A negative result must be combined with clinical observations, patient history, and epidemiological information. Fact Sheet for Patients:   StrictlyIdeas.no Fact Sheet for Healthcare Providers: BankingDealers.co.za This test is not yet approved or cleared  by the [place] FDA and has been authorized for detection and/or diagnosis of SARS-CoV-2 by FDA under an Emergency Use Authorization (EUA).  This EUA will remain in effect (meaning this test can be used) for the duration of the COVID-19 declaration under Section 564(b)(1) of the Act, 21 U.S.C. section 360bbb-3(b)(1), unless the authorization is terminated or revoked sooner. Performed at [facility], [address] [place], [place] [address]     Coagulation Studies: Recent Labs    06/14/19 1006  LABPROT 13.1  INR 1.0    Urinalysis: No results for input(s): COLORURINE, LABSPEC, PHURINE, GLUCOSEU, HGBUR, BILIRUBINUR, KETONESUR, PROTEINUR, UROBILINOGEN, NITRITE, LEUKOCYTESUR in the last 168 hours.  Invalid input(s): APPERANCEUR  Lipid Panel:    Component Value Date/Time   CHOL 177 06/15/2019 0535   CHOL 175 09/30/2018 1038   CHOL 213 10/30/2017 1101   TRIG 127 06/15/2019 0535   TRIG 142 10/30/2017 1101   HDL 35 (L) 06/15/2019 0535   HDL 39 (L) 09/30/2018 1038   HDL 42 10/30/2017 1101   CHOLHDL 5.1 06/15/2019 0535   VLDL 25 06/15/2019 0535   LDLCALC 117 (H) 06/15/2019 0535   LDLCALC 112 (H) 09/30/2018 1038    LDLCALC 143 10/30/2017 1101    HgbA1C:  Lab Results  Component Value Date   HGBA1C 8.1 (H) 06/15/2019    Urine Drug Screen:  No results found for: LABOPIA, COCAINSCRNUR, LABBENZ, AMPHETMU, THCU, LABBARB  Alcohol Level: No results for input(s): ETH in the last 168 hours.  Other results: EKG: normal sinus rhythm at 83 bpm.  Imaging: CT HEAD WO CONTRAST  Result Date: 06/14/2019 CLINICAL DATA:  Dizziness for 1 day EXAM: CT HEAD WITHOUT CONTRAST TECHNIQUE: Contiguous axial images were obtained from the base of the skull through the vertex without intravenous contrast. COMPARISON:  None. FINDINGS: Brain: No evidence of acute infarction, hemorrhage, hydrocephalus, extra-axial collection or mass lesion/mass effect. Vascular: No hyperdense vessel or unexpected calcification. Skull: Normal. Negative for fracture or focal lesion. Sinuses/Orbits: No acute finding. Other: None. IMPRESSION: Normal head CT for age Electronically Signed   By: Inez Catalina M.D.   On: 06/14/2019 10:23   MR ANGIO HEAD WO CONTRAST  Result Date:  06/14/2019 CLINICAL DATA:  Stroke follow-up. Left internal capsule infarct on MRI. EXAM: MRA HEAD WITHOUT CONTRAST TECHNIQUE: Angiographic images of the Circle of Willis were obtained using MRA technique without intravenous contrast. COMPARISON:  None. FINDINGS: The visualized distal vertebral arteries are patent to the basilar. The left vertebral artery is strongly dominant without stenosis. An apparent 7 mm long segment of severe narrowing involving the proximal right V4 segment may be artifactual signal loss secondary to small vessel size based on the source images although an underlying stenosis is also possible. Patent right PICA, bilateral AICA, and bilateral SCA origins are visualized. The basilar artery is widely patent. Both PCAs are patent without evidence of a significant stenosis on the right. There is a severe mid left P2 stenosis. The internal carotid arteries are widely  patent from skull base to carotid termini. ACAs and MCAs are patent without evidence of a proximal branch occlusion or a significant proximal stenosis. No aneurysm is identified. IMPRESSION: 1. No large vessel occlusion. 2. Severe mid left P2 stenosis. 3. V4 segment artifact versus stenosis in the non-dominant right vertebral artery. Electronically Signed   By: Logan Bores M.D.   On: 06/14/2019 19:33   MR BRAIN WO CONTRAST  Result Date: 06/14/2019 CLINICAL DATA:  Dizziness for 1 day. Numbness involving the lips as well as right thumb, index finger, and long finger. EXAM: MRI HEAD WITHOUT CONTRAST TECHNIQUE: Multiplanar, multiecho pulse sequences of the brain and surrounding structures were obtained without intravenous contrast. COMPARISON:  Head CT 06/14/2019 FINDINGS: Brain: There is a subcentimeter acute infarct in the posterior limb of the left internal capsule. A few small scattered foci of T2 hyperintensity elsewhere in the cerebral white matter are nonspecific but compatible with minimal chronic small vessel ischemic disease. The ventricles and sulci are normal. No intracranial hemorrhage, mass, midline shift, or extra-axial fluid collection is identified. There is a mildly expanded partially empty sella. Vascular: Major intracranial vascular flow voids are preserved. Skull and upper cervical spine: Unremarkable bone marrow signal para Sinuses/Orbits: Unremarkable orbits. Small right maxillary sinus mucous retention cyst. Clear mastoid air cells. Other: None. 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 Electronically Signed   By: Logan Bores M.D.   On: 06/14/2019 14:55    Assessment: 59 y.o. female with medical history significant of hypertension, hyperlipidemia, diabetes mellitus, Bell's palsy, COVID-19 infection 12/28/2018, who presented with dizziness, right leg weakness, right hand numbness.  Patient outside time window for tPA.  MRI of the brain personally  reviewed and reveals an acute left internal capsular infarct.  Etiology likely small vessel disease.  Patient with multiple vascular risk factors.  MRA shows severe left P2 stenosis.   Echocardiogram and carotid dopplers are pending.  A1c 8.1, LDL 117.    Stroke Risk Factors - diabetes mellitus, hyperlipidemia and hypertension  Plan: 1. Aggressive lipid management with target LDL<70. 2. Blood sugar management with target A1c<7.0.  Patient is requesting a nutrition consult 3. PT consult, OT consult, Speech consult 4. Echocardiogram pending 5. Carotid dopplers pending 6. Prophylactic therapy-Dual antiplatelet therapy with ASA 81mg  and Plavix 75mg  for three weeks with change to Plavix 75mg  daily alone as monotherapy after that time. 7. NPO until RN stroke swallow screen 8. Telemetry monitoring 9. Frequent neuro checks 10. BP control with target BP<140/80  Alexis Goodell, MD Neurology [phone] 06/15/2019, 10:36 AM

## 2019-06-15 NOTE — TOC Transition Note (Signed)
Transition of Care [facility]) - CM/SW Discharge Note   Patient Details  Name: Angelica [name] MRN: [id number] Date of Birth: February 24, 1960  [facility] CM/SW Contact:  Shelbie Hutching, RN Phone Number: 06/15/2019, 4:05 PM   Clinical Narrative:    Patient is medically cleared for discharge home.  [facility] has agreed to accept the patient for home health PT with a letter of guarantee.     Final next level of care: Home w Home Health Services Barriers to Discharge: Barriers Resolved   Patient Goals and CMS Choice   CMS Medicare.gov Compare Post Acute Care list provided to:: Patient Choice offered to / list presented to : Patient  Discharge Placement                       Discharge Plan and Services   Discharge Planning Services: CM Consult Post Acute Care Choice: Home Health          DME Arranged: [place] DME Agency: Other - Comment([facility])       HH Arranged: PT [place] Agency: [place] ([place]) Date [place]: 06/15/19 Time [place]: [id number] Representative spoke with at [place]: [place] ([place]) Interventions     Readmission Risk Interventions No flowsheet data found.

## 2019-06-15 NOTE — Plan of Care (Signed)
Problem: Education: Goal: Knowledge of General Education information will improve Description: Including pain rating scale, medication(s)/side effects and non-pharmacologic comfort measures Outcome: Progressing   Problem: Health Behavior/Discharge Planning: Goal: Ability to manage health-related needs will improve Outcome: Progressing   Problem: Clinical Measurements: Goal: Ability to maintain clinical measurements within normal limits will improve Outcome: Progressing Goal: Will remain free from infection Outcome: Progressing Goal: Diagnostic test results will improve Outcome: Progressing Goal: Respiratory complications will improve Outcome: Progressing Goal: Cardiovascular complication will be avoided Outcome: Progressing   Problem: Activity: Goal: Risk for activity intolerance will decrease Outcome: Progressing   Problem: Nutrition: Goal: Adequate nutrition will be maintained Outcome: Progressing   Problem: Coping: Goal: Level of anxiety will decrease Outcome: Progressing   Problem: Elimination: Goal: Will not experience complications related to bowel motility Outcome: Progressing Goal: Will not experience complications related to urinary retention Outcome: Progressing   Problem: Pain Managment: Goal: General experience of comfort will improve Outcome: Progressing   Problem: Safety: Goal: Ability to remain free from injury will improve Outcome: Progressing   Problem: Skin Integrity: Goal: Risk for impaired skin integrity will decrease Outcome: Progressing   Problem: Education: Goal: Knowledge of disease or condition will improve Outcome: Progressing Goal: Knowledge of secondary prevention will improve Outcome: Progressing Goal: Knowledge of patient specific risk factors addressed and post discharge goals established will improve Outcome: Progressing Goal: Individualized Educational Video(s) Outcome: Progressing   Problem: Coping: Goal: Will verbalize  positive feelings about self Outcome: Progressing Goal: Will identify appropriate support needs Outcome: Progressing   Problem: Health Behavior/Discharge Planning: Goal: Ability to manage health-related needs will improve Outcome: Progressing   Problem: Self-Care: Goal: Ability to participate in self-care as condition permits will improve Outcome: Progressing Goal: Verbalization of feelings and concerns over difficulty with self-care will improve Outcome: Progressing Goal: Ability to communicate needs accurately will improve Outcome: Progressing   Problem: Nutrition: Goal: Risk of aspiration will decrease Outcome: Progressing

## 2019-06-15 NOTE — Evaluation (Signed)
Occupational Therapy Evaluation Patient Details Name: Angelica [name] MRN: [id number] DOB: 08/10/1960 Today's Date: 06/15/2019    History of Present Illness Pt is a 59 y/o F with PMH: HTN, HLD, DM, GERD, Bell's Palsy (x28yrs), and COVID 12/28/18. Pt presented d/t new onset dizziness, R LE weakness, and R hand numbess. Pt found to have small lacunar infarct on MRI.   Clinical Impression   Pt was seen for OT evaluation this date. 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 Pt lives in [facility] with ramped entrance with her sister that she cares for. Currently pt demonstrates impairments as described below (See OT problem list) which functionally limit her ability to perform ADL/self-care tasks. Pt currently requires extended time-MOD I to perform all fine motor tasks d/t decreased sensation/FMC of R hand (specifically: thumb, second and third digit).  Pt would benefit from skilled OT to address noted impairments and functional limitations (see below for any additional details) in order to maximize coordination and independence with fine motor ADL tasks. Upon hospital discharge, recommend OPOT to maximize pt return to functional independence during meaningful occupations of daily life.      Follow Up Recommendations  Outpatient OT    Equipment Recommendations  None recommended by OT    Recommendations for Other Services       Precautions / Restrictions Precautions Precautions: Fall Restrictions Weight Bearing Restrictions: No Other Position/Activity Restrictions: monitor BP, +Orthostatic with sup to sit (126/73 sup, 96/79 sitting-does c/o dizziness, therex performed to incr circulation and then 136/91, 140/97 standing).      Mobility Bed Mobility Overal bed mobility: Modified Independent                Transfers Overall transfer level: Needs assistance Equipment used: None Transfers: Sit  to/from Stand Sit to Stand: Supervision         General transfer comment: some extended time d/t c/o R LE numbness.    Balance Overall balance assessment: Needs assistance Sitting-balance support: Feet supported Sitting balance-Leahy Scale: Normal     Standing balance support: No upper extremity supported Standing balance-Leahy Scale: Good Standing balance comment: no gross LOB, but some decreased stability with fxl mobility noted in R LE (appears to use circumduction to compensate)                           ADL either performed or assessed with clinical judgement   ADL Overall ADL's : Modified independent                                       General ADL Comments: Pt requires extended time and does demo difficulty to perform fine motor tasks such as removing/replacing cap of toothpaste and threading socks, but ultimately performs all aspects of self care at I to MOD I level (supervision provided d/t nature of evaluation).     Vision Patient Visual Report: No change from baseline Additional Comments: pt reports some decreased vision with Bell's Palsy 3YA, but no new changes on this admission     Perception     Praxis      Pertinent Vitals/Pain Pain Assessment: No/denies pain     Hand Dominance Right   Extremity/Trunk Assessment Upper Extremity Assessment Upper Extremity Assessment: Overall WFL for tasks assessed;RUE deficits/detail;LUE deficits/detail  RUE Deficits / Details: pads of thumb, 2nd and 3rd digit feel numb, decreased sensation, decreased detection of stimuli. ROM and strength WFL. No detected dysdiadochokinesia. RUE Sensation: decreased light touch;decreased proprioception LUE Deficits / Details: WFL   Lower Extremity Assessment Lower Extremity Assessment: Defer to PT evaluation       Communication Communication Communication: No difficulties   Cognition Arousal/Alertness: Awake/alert Behavior During Therapy: WFL for  tasks assessed/performed Overall Cognitive Status: Within Functional Limits for tasks assessed                                     General Comments       Exercises Other Exercises Other Exercises: OT facilitates education re: fall prevention including monitoring BP and noting if dizzy with position change. Pt with good reception. Other Exercises: OT faciliates pt participation in overhead UE raises x20 alternating and ankle pumps x20 while seated EOB to assist with BP in prep to stand.   Shoulder Instructions      Home Living Family/patient expects to be discharged to:: Private residence Living Arrangements: Other relatives(pt's older sister with dementia lives with her and pt acts as caregiver) Available Help at Discharge: Family(2 sons and another sister that live nearby and can help some/check on pt some) Type of Home: House Home Access: Ramped entrance     Home Layout: One level     Bathroom Shower/Tub: Teacher, early years/pre: Standard     Home Equipment: Bedside commode;Shower seat   Additional Comments: above listed equipment is pt's sister's equipment      Prior Functioning/Environment Level of Independence: Independent        Comments: Pt performing all I/ADLs I'ly including driving, getting groceries, acting as sister's caregiver, etc.        OT Problem List: Decreased coordination;Impaired sensation      OT Treatment/Interventions: Self-care/ADL training;Therapeutic exercise;DME and/or AE instruction;Therapeutic activities;Patient/family education;Balance training;Neuromuscular education    OT Goals(Current goals can be found in the care plan section) Acute Rehab OT Goals Patient Stated Goal: to go home OT Goal Formulation: With patient Time For Goal Achievement: 06/29/19 Potential to Achieve Goals: Good  OT Frequency: Min 1X/week   Barriers to D/C:            Co-evaluation              AM-PAC OT "6 Clicks"  Daily Activity     Outcome Measure Help from another person eating meals?: None Help from another person taking care of personal grooming?: None Help from another person toileting, which includes using toliet, bedpan, or urinal?: A Little Help from another person bathing (including washing, rinsing, drying)?: None Help from another person to put on and taking off regular upper body clothing?: None Help from another person to put on and taking off regular lower body clothing?: A Little 6 Click Score: 22   End of Session Equipment Utilized During Treatment: Gait belt Nurse Communication: Mobility status;Other (comment)(BP-noted in precuation section)  Activity Tolerance: Patient tolerated treatment well Patient left: in chair;with call bell/phone within reach;with chair alarm set;with family/visitor present  OT Visit Diagnosis: Unsteadiness on feet (R26.81)                Time: [id number] OT Time Calculation (min): 38 min Charges:  OT General Charges $OT Visit: 1 Visit OT Evaluation $OT Eval Low Complexity: 1 Low OT Treatments $Self Care/Home Management :  8-22 mins $Therapeutic Activity: 8-22 mins  Gerrianne Scale, [place], OTR/L ascom [phone] 06/15/19, 12:26 PM

## 2019-06-15 NOTE — Progress Notes (Signed)
PT Cancellation Note  Patient Details Name: Angelica [name] MRN: [id number] DOB: Jun 11, 1960   Cancelled Treatment:    Reason Eval/Treat Not Completed: Other (comment).  PT consult received.  Chart reviewed.  Upon therapist arriving to pt's room, OT present for OT evaluation (pt not available for PT session).  Will re-attempt PT evaluation at a later time.  Leitha Bleak, PT 06/15/19, 9:58 AM

## 2019-06-15 NOTE — TOC Initial Note (Signed)
Transition of Care [facility]) - Initial/Assessment Note    Patient Details  Name: Angelica [name] MRN: [id number] Date of Birth: [birth year]  [facility] CM/SW Contact:    Shelbie Hutching, RN Phone Number: 06/15/2019, 1:24 PM  Clinical Narrative:                 Patient placed under observation for stroke.  Patient reports that she lives a home and cares for her older sister, who has dementia.  Patient is independent at baseline and requires no assistive devices.  PT has worked with patient and is recommending home health PT and OT.  Patient agrees with having home health, [facility] is providing charity care this week and will accept patient for PT and OT.  Patient needs a walker but has no money to buy one, [facility] is providing patient with a rolling walker, it has been delivered to the patient's room.  Patient's son will provide transportation at discharge.   Patient is current with [facility].    Expected Discharge Plan: [place] Barriers to Discharge: Continued Medical Work up   Patient Goals and CMS Choice   CMS Medicare.gov Compare Post Acute Care list provided to:: Patient Choice offered to / list presented to : Patient  Expected Discharge Plan and Services Expected Discharge Plan: [place]   Discharge Planning Services: CM Consult Post Acute Care Choice: [place] arrangements for the past 2 months: Single Family Home                 DME Arranged: Walker rolling DME Agency: Other - Comment([facility])       HH Arranged: PT, OT [place] Agency: [place] ([place]) Date [place]: 06/15/19 Time [place]: [place] Representative spoke with at [place]: [place] Arrangements/Services Living arrangements for the past 2 months: [place] with:: Siblings Patient language and need for interpreter  reviewed:: Yes Do you feel safe going back to the place where you live?: Yes      Need for Family Participation in Patient Care: Yes (Comment)(son) Care giver support system in place?: Yes (comment)(son and sister)   Criminal Activity/Legal Involvement Pertinent to Current Situation/Hospitalization: No - Comment as needed  Activities of Daily Living Home Assistive Devices/Equipment: Eyeglasses ADL Screening (condition at time of admission) Patient's cognitive ability adequate to safely complete daily activities?: Yes Is the patient deaf or have difficulty hearing?: No Does the patient have difficulty seeing, even when wearing glasses/contacts?: No Does the patient have difficulty concentrating, remembering, or making decisions?: No Patient able to express need for assistance with ADLs?: Yes Does the patient have difficulty dressing or bathing?: No Independently performs ADLs?: Yes (appropriate for developmental age) Does the patient have difficulty walking or climbing stairs?: No Weakness of Legs: None Weakness of Arms/Hands: None  Permission Sought/Granted Permission sought to share information with : Case Manager, Other (comment) Permission granted to share information with : Yes, Verbal Permission Granted     Permission granted to share info w AGENCY: [facility]        Emotional Assessment   Attitude/Demeanor/Rapport: Engaged Affect (typically observed): Accepting Orientation: : Oriented to Self, Oriented to Place, Oriented to  Time, Oriented to Situation Alcohol / Substance Use: Not Applicable Psych Involvement: No (comment)  Admission diagnosis:  Stroke [facility]) [I63.9] Lacunar infarct, acute ([place]) [I63.81] Patient  Active Problem List   Diagnosis Date Noted  . Stroke ([place]) 06/14/2019  . Headache 10/20/2018  . Insomnia 10/20/2018  . Anxiety 10/20/2018  . Dysuria 10/20/2018  . Encounter for screening colonoscopy   . Residual hemorrhoidal skin tags   .  Internal hemorrhoids   . Polyp of ascending colon   . 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 Healthcare maintenance 09/23/2018  . Menopausal hot flushes 07/20/2015  . GERD (gastroesophageal reflux disease) 01/27/2015  . Hypertension 01/27/2015  . Hyperlipidemia 01/27/2015  . Bell palsy 08/04/2014  . Diabetes mellitus without complication ([place]) [id number]   PCP:  Tawni Millers, MD Pharmacy:   [facility] [place], [address] [place] [address] Phone: [phone] Fax: [phone]     Social Determinants of Health (SDOH) Interventions    Readmission Risk Interventions No flowsheet data found.

## 2019-06-15 NOTE — Progress Notes (Signed)
SLP Cancellation Note  Patient Details Name: Angelica [name] MRN: [id number] DOB: February 19, 1960   Cancelled treatment:       Reason Eval/Treat Not Completed: SLP screened, no needs identified, will sign off(chart reviewed; consulted pt/family, then NSG). Pt denied any difficulty swallowing and is currently on a regular diet; tolerates swallowing pills w/ water per NSG. Pt conversed at conversational level w/out deficits noted; pt and family denied any current/new speech-language deficits. Pt stated the R corner of her lips were "tingly". Pt has old h/o Bell's Palsy affecting the R orofacial and eye; no new changes per pt. No further skilled ST services indicated as pt appears at her baseline. Pt agreed. NSG to reconsult if any new needs arise while admitted.     Orinda Kenner, MS, CCC-SLP Riya Huxford 06/15/2019, 9:16 AM

## 2019-06-15 NOTE — Evaluation (Signed)
Physical Therapy Evaluation Patient Details Name: Angelica [name] MRN: [id number] DOB: 24-Aug-1960 Today's Date: 06/15/2019   History of Present Illness  Pt is a 59 y.o. female presenting to hospital 5/24 feeling off balance, numbness in lips, R leg feeling off/weakness, R hand numbness, and dizziness.  Imaging showing acute lacunar infarct in the posterior limb of the left internal capsule.  PMH includes Bell's palsy (chronic R facial droop), DM, htn, HLD, (+) COVID-19 12/28/2018.  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  Impaired R LE coordination and strength noted.  Currently pt is CGA to SBA with transfers and CGA with ambulation.  Trialed pt with no AD, then with SPC, then with RW.  Improved balance, gait pattern/technique, and R knee stability feeling noted with use of RW (vc's required for gait technique).  Dizziness noted with movement/activities but with cueing for gaze stabilization (focusing on object while ambulating) decreased dizziness noted.  Pt would benefit from skilled PT to address noted impairments and functional limitations (see below for any additional details).  Upon hospital discharge, pt would benefit from [place].    Follow Up Recommendations Home health PT    Equipment Recommendations  Rolling walker with 5" wheels;3in1 (PT)    Recommendations for Other Services OT consult     Precautions / Restrictions Precautions Precautions: Fall Restrictions Weight Bearing Restrictions: No Other Position/Activity Restrictions: Dizziness with movement/activity      Mobility  Bed Mobility             General bed mobility comments: Deferred (pt in recliner beginning/end of session)  Transfers Overall transfer level: Needs assistance Equipment used: None Transfers: Sit to/from Stand Sit to Stand: Min guard;Supervision         General transfer  comment: x1 trial standing from recliner; x2 trials standing from bed; mild increased effort to stand with vc's for UE placement with transfers using RW  Ambulation/Gait Ambulation/Gait assistance: Min guard Gait Distance (Feet): (2 feet no AD; 4 feet with SPC; 125 feet with RW) Assistive device: None;Straight cane;Rolling walker (2 wheeled)   Gait velocity: decreased   General Gait Details: pt demonstrating difficulty advancing R LE with no AD (decreased foot clearance and heelstrike) and reporting feeling sensation of R knee may buckle; trialed SPC (pt demonstrating minimum improvement in foot clearance and mild improvement in R knee stability feeling); pt requiring vc's for sequencing and walker use (initially step to gait pattern progressing to partial step through gait pattern)--improved R foot clearance, R heel strike, and R knee flexion during R LE advancement noted and pt reporting improved R knee stability feeling  Stairs            Wheelchair Mobility    Modified Rankin (Stroke Patients Only)       Balance Overall balance assessment: Needs assistance Sitting-balance support: No upper extremity supported;Feet supported Sitting balance-Leahy Scale: Normal Sitting balance - Comments: steady sitting reaching outside BOS   Standing balance support: No upper extremity supported Standing balance-Leahy Scale: Good Standing balance comment: able to stand reaching within BOS but appearing with decreased balance attempting to ambulate without UE support                             Pertinent Vitals/Pain Pain Assessment: No/denies pain  HR 96-113 bpm during sessions activities.  O2 sats WFL on room  air during sessions activities.    Home Living Family/patient expects to be discharged to:: Private residence Living Arrangements: Other relatives(pt lives with her sister who has dementia--pt reports she is her sister's caretaker (no physical assist required)) Available  Help at Discharge: Family Type of Home: House Home Access: Ramped entrance     Home Layout: One level Home Equipment: Bedside commode;Shower seat;Grab bars - toilet(BSC and shower seat per OT eval) Additional Comments: above listed equipment is from pt's mom and sister    Prior Function Level of Independence: Independent         Comments: Pt reports no falls in past 6 months.     Hand Dominance   Dominant Hand: Right(per OT eval)    Extremity/Trunk Assessment   Upper Extremity Assessment Upper Extremity Assessment: Defer to OT evaluation RUE Deficits / Details: per OT eval "pads of thumb, 2nd and 3rd digit feel numb, decreased sensation, decreased detection of stimuli. ROM and strength WFL. No detected dysdiadochokinesia." RUE Sensation: decreased light touch;decreased proprioception(per OT eval) LUE Deficits / Details: WFL    Lower Extremity Assessment Lower Extremity Assessment: RLE deficits/detail;LLE deficits/detail(intact B LE light touch, tone, proprioception) RLE Deficits / Details: hip flexion 4+/5; knee flexion and extension 4/5; DF and inversion/eversion 4/5; PF at least 3/5 RLE Coordination: decreased gross motor(decreased quality R LE heel to shin coordination) LLE Deficits / Details: 5/5 hip flexion, knee flexion/extension, and DF/PF MMT    Cervical / Trunk Assessment Cervical / Trunk Assessment: Normal  Communication   Communication: No difficulties  Cognition Arousal/Alertness: Awake/alert Behavior During Therapy: WFL for tasks assessed/performed Overall Cognitive Status: Within Functional Limits for tasks assessed                                        General Comments General comments (skin integrity, edema, etc.): R knee anterior lateral swelling noted (pt reports R knee and lower leg was swollen 3 weeks ago but is better now).  Nursing cleared pt for participation in physical therapy.  Pt agreeable to PT session.    Exercises  Treatment:  Gait training (no AD vs SPC vs RW trial during session; vc's for gait technique--see above for details); gaze stabilization with ambulation   Assessment/Plan    PT Assessment Patient needs continued PT services  PT Problem List Decreased strength;Decreased activity tolerance;Decreased balance;Decreased mobility;Decreased coordination;Decreased knowledge of use of DME;Decreased knowledge of precautions       PT Treatment Interventions DME instruction;Gait training;Functional mobility training;Therapeutic activities;Therapeutic exercise;Balance training;Neuromuscular re-education;Patient/family education    PT Goals (Current goals can be found in the Care Plan section)  Acute Rehab PT Goals Patient Stated Goal: improve walking PT Goal Formulation: With patient Time For Goal Achievement: 06/29/19 Potential to Achieve Goals: Good    Frequency 7X/week   Barriers to discharge        Co-evaluation               AM-PAC PT "6 Clicks" Mobility  Outcome Measure Help needed turning from your back to your side while in a flat bed without using bedrails?: None Help needed moving from lying on your back to sitting on the side of a flat bed without using bedrails?: None Help needed moving to and from a bed to a chair (including a wheelchair)?: A Little Help needed standing up from a chair using your arms (e.g., wheelchair or bedside chair)?: A Little  Help needed to walk in hospital room?: A Little Help needed climbing 3-5 steps with a railing? : A Little 6 Click Score: 20    End of Session Equipment Utilized During Treatment: Gait belt Activity Tolerance: Patient tolerated treatment well Patient left: in chair;with call bell/phone within reach;with chair alarm set Nurse Communication: Mobility status;Precautions;Other (comment)(via white board) PT Visit Diagnosis: Other abnormalities of gait and mobility (R26.89);Hemiplegia and hemiparesis;Difficulty in walking, not  elsewhere classified (R26.2) Hemiplegia - Right/Left: Right Hemiplegia - dominant/non-dominant: Dominant Hemiplegia - caused by: Cerebral infarction    [id number] PT Time Calculation (min) (ACUTE ONLY): 45 min   Charges:   PT Evaluation $PT Eval Low Complexity: 1 Low PT Treatments $Gait Training: 23-37 mins       Leitha Bleak, PT 06/15/19, 12:17 PM

## 2019-06-15 NOTE — Progress Notes (Signed)
*  PRELIMINARY RESULTS* Echocardiogram 2D Echocardiogram has been performed.  Wallie Char Cedar Ditullio 06/15/2019, 9:47 AM

## 2019-06-16 ENCOUNTER — Other Ambulatory Visit: Payer: Self-pay | Admitting: Internal Medicine

## 2019-06-16 DIAGNOSIS — E7800 Pure hypercholesterolemia, unspecified: Secondary | ICD-10-CM

## 2019-06-17 ENCOUNTER — Other Ambulatory Visit: Payer: Self-pay

## 2019-06-17 ENCOUNTER — Ambulatory Visit: Payer: Medicaid Other | Admitting: Gerontology

## 2019-06-17 VITALS — BP 126/86 | HR 97 | Temp 97.8°F | Ht 63.0 in | Wt 175.5 lb

## 2019-06-17 DIAGNOSIS — E7800 Pure hypercholesterolemia, unspecified: Secondary | ICD-10-CM

## 2019-06-17 DIAGNOSIS — I10 Essential (primary) hypertension: Secondary | ICD-10-CM

## 2019-06-17 DIAGNOSIS — E119 Type 2 diabetes mellitus without complications: Secondary | ICD-10-CM

## 2019-06-17 DIAGNOSIS — K219 Gastro-esophageal reflux disease without esophagitis: Secondary | ICD-10-CM

## 2019-06-17 DIAGNOSIS — I639 Cerebral infarction, unspecified: Secondary | ICD-10-CM

## 2019-06-17 DIAGNOSIS — Z09 Encounter for follow-up examination after completed treatment for conditions other than malignant neoplasm: Secondary | ICD-10-CM

## 2019-06-17 MED ORDER — HYDROCHLOROTHIAZIDE 25 MG PO TABS: 25.0000 mg | ORAL_TABLET | Freq: Every day | ORAL | 0 refills | Status: DC

## 2019-06-17 MED ORDER — ASPIRIN EC 81 MG PO TBEC: 81.0000 mg | DELAYED_RELEASE_TABLET | Freq: Every day | ORAL | 0 refills | Status: DC

## 2019-06-17 MED ORDER — ATORVASTATIN CALCIUM 40 MG PO TABS: 40.0000 mg | ORAL_TABLET | Freq: Every day | ORAL | 0 refills | Status: DC

## 2019-06-17 MED ORDER — OMEPRAZOLE 20 MG PO CPDR: 20.0000 mg | DELAYED_RELEASE_CAPSULE | Freq: Every day | ORAL | 0 refills | Status: DC

## 2019-06-17 NOTE — Patient Instructions (Signed)
Carbohydrate Counting for Diabetes Mellitus, Adult  Carbohydrate counting is a method of keeping track of how many carbohydrates you eat. Eating carbohydrates naturally increases the amount of sugar (glucose) in the blood. Counting how many carbohydrates you eat helps keep your blood glucose within normal limits, which helps you manage your diabetes (diabetes mellitus). It is important to know how many carbohydrates you can safely have in each meal. This is different for every person. A diet and nutrition specialist (registered dietitian) can help you make a meal plan and calculate how many carbohydrates you should have at each meal and snack. Carbohydrates are found in the following foods:  Grains, such as breads and cereals.  Dried beans and soy products.  Starchy vegetables, such as potatoes, peas, and corn.  Fruit and fruit juices.  Milk and yogurt.  Sweets and snack foods, such as cake, cookies, candy, chips, and soft drinks. How do I count carbohydrates? There are two ways to count carbohydrates in food. You can use either of the methods or a combination of both. Reading "Nutrition Facts" on packaged food The "Nutrition Facts" list is included on the labels of almost all packaged foods and beverages in the [place] It includes:  The serving size.  Information about nutrients in each serving, including the grams (g) of carbohydrate per serving. To use the "Nutrition Facts":  Decide how many servings you will have.  Multiply the number of servings by the number of carbohydrates per serving.  The resulting number is the total amount of carbohydrates that you will be having. Learning standard serving sizes of other foods When you eat carbohydrate foods that are not packaged or do not include "Nutrition Facts" on the label, you need to measure the servings in order to count the amount of carbohydrates:  Measure the foods that you will eat with a food scale or measuring cup, if needed.   Decide how many standard-size servings you will eat.  Multiply the number of servings by 15. Most carbohydrate-rich foods have about 15 g of carbohydrates per serving. ? For example, if you eat 8 oz (170 g) of strawberries, you will have eaten 2 servings and 30 g of carbohydrates (2 servings x 15 g = 30 g).  For foods that have more than one food mixed, such as soups and casseroles, you must count the carbohydrates in each food that is included. The following list contains standard serving sizes of common carbohydrate-rich foods. Each of these servings has about 15 g of carbohydrates:   hamburger bun or  English muffin.   oz (15 mL) syrup.   oz (14 g) jelly.  1 slice of bread.  1 six-inch tortilla.  3 oz (85 g) cooked rice or pasta.  4 oz (113 g) cooked dried beans.  4 oz (113 g) starchy vegetable, such as peas, corn, or potatoes.  4 oz (113 g) hot cereal.  4 oz (113 g) mashed potatoes or  of a large baked potato.  4 oz (113 g) canned or frozen fruit.  4 oz (120 mL) fruit juice.  4-6 crackers.  6 chicken nuggets.  6 oz (170 g) unsweetened dry cereal.  6 oz (170 g) plain fat-free yogurt or yogurt sweetened with artificial sweeteners.  8 oz (240 mL) milk.  8 oz (170 g) fresh fruit or one small piece of fruit.  24 oz (680 g) popped popcorn. Example of carbohydrate counting Sample meal  3 oz (85 g) chicken breast.  6 oz (170 g)   brown rice.  4 oz (113 g) corn.  8 oz (240 mL) milk.  8 oz (170 g) strawberries with sugar-free whipped topping. Carbohydrate calculation 1. Identify the foods that contain carbohydrates: ? Rice. ? Corn. ? Milk. ? Strawberries. 2. Calculate how many servings you have of each food: ? 2 servings rice. ? 1 serving corn. ? 1 serving milk. ? 1 serving strawberries. 3. Multiply each number of servings by 15 g: ? 2 servings rice x 15 g = 30 g. ? 1 serving corn x 15 g = 15 g. ? 1 serving milk x 15 g = 15 g. ? 1 serving  strawberries x 15 g = 15 g. 4. Add together all of the amounts to find the total grams of carbohydrates eaten: ? 30 g + 15 g + 15 g + 15 g = 75 g of carbohydrates total. Summary  Carbohydrate counting is a method of keeping track of how many carbohydrates you eat.  Eating carbohydrates naturally increases the amount of sugar (glucose) in the blood.  Counting how many carbohydrates you eat helps keep your blood glucose within normal limits, which helps you manage your diabetes.  A diet and nutrition specialist (registered dietitian) can help you make a meal plan and calculate how many carbohydrates you should have at each meal and snack. This information is not intended to replace advice given to you by your health care provider. Make sure you discuss any questions you have with your health care provider. Document Revised: 08/01/2016 Document Reviewed: 06/21/2015 Elsevier Patient Education  2020 Elsevier Inc. DASH Eating Plan DASH stands for "Dietary Approaches to Stop Hypertension." The DASH eating plan is a healthy eating plan that has been shown to reduce high blood pressure (hypertension). It may also reduce your risk for type 2 diabetes, heart disease, and stroke. The DASH eating plan may also help with weight loss. What are tips for following this plan?  General guidelines  Avoid eating more than 2,300 mg (milligrams) of salt (sodium) a day. If you have hypertension, you may need to reduce your sodium intake to 1,500 mg a day.  Limit alcohol intake to no more than 1 drink a day for nonpregnant women and 2 drinks a day for men. One drink equals 12 oz of beer, 5 oz of wine, or 1 oz of hard liquor.  Work with your health care provider to maintain a healthy body weight or to lose weight. Ask what an ideal weight is for you.  Get at least 30 minutes of exercise that causes your heart to beat faster (aerobic exercise) most days of the week. Activities may include walking, swimming, or  biking.  Work with your health care provider or diet and nutrition specialist (dietitian) to adjust your eating plan to your individual calorie needs. Reading food labels   Check food labels for the amount of sodium per serving. Choose foods with less than 5 percent of the Daily Value of sodium. Generally, foods with less than 300 mg of sodium per serving fit into this eating plan.  To find whole grains, look for the word "whole" as the first word in the ingredient list. Shopping  Buy products labeled as "low-sodium" or "no salt added."  Buy fresh foods. Avoid canned foods and premade or frozen meals. Cooking  Avoid adding salt when cooking. Use salt-free seasonings or herbs instead of table salt or sea salt. Check with your health care provider or pharmacist before using salt substitutes.  Do not   fry foods. Cook foods using healthy methods such as baking, boiling, grilling, and broiling instead.  Cook with heart-healthy oils, such as olive, canola, soybean, or sunflower oil. Meal planning  Eat a balanced diet that includes: ? 5 or more servings of fruits and vegetables each day. At each meal, try to fill half of your plate with fruits and vegetables. ? Up to 6-8 servings of whole grains each day. ? Less than 6 oz of lean meat, poultry, or fish each day. A 3-oz serving of meat is about the same size as a deck of cards. One egg equals 1 oz. ? 2 servings of low-fat dairy each day. ? A serving of nuts, seeds, or beans 5 times each week. ? Heart-healthy fats. Healthy fats called Omega-3 fatty acids are found in foods such as flaxseeds and coldwater fish, like sardines, salmon, and mackerel.  Limit how much you eat of the following: ? Canned or prepackaged foods. ? Food that is high in trans fat, such as fried foods. ? Food that is high in saturated fat, such as fatty meat. ? Sweets, desserts, sugary drinks, and other foods with added sugar. ? Full-fat dairy products.  Do not salt  foods before eating.  Try to eat at least 2 vegetarian meals each week.  Eat more home-cooked food and less restaurant, buffet, and fast food.  When eating at a restaurant, ask that your food be prepared with less salt or no salt, if possible. What foods are recommended? The items listed may not be a complete list. Talk with your dietitian about what dietary choices are best for you. Grains Whole-grain or whole-wheat bread. Whole-grain or whole-wheat pasta. Brown rice. Oatmeal. Quinoa. Bulgur. Whole-grain and low-sodium cereals. Pita bread. Low-fat, low-sodium crackers. Whole-wheat flour tortillas. Vegetables Fresh or frozen vegetables (raw, steamed, roasted, or grilled). Low-sodium or reduced-sodium tomato and vegetable juice. Low-sodium or reduced-sodium tomato sauce and tomato paste. Low-sodium or reduced-sodium canned vegetables. Fruits All fresh, dried, or frozen fruit. Canned fruit in natural juice (without added sugar). Meat and other protein foods Skinless chicken or turkey. Ground chicken or turkey. Pork with fat trimmed off. Fish and seafood. Egg whites. Dried beans, peas, or lentils. Unsalted nuts, nut butters, and seeds. Unsalted canned beans. Lean cuts of beef with fat trimmed off. Low-sodium, lean deli meat. Dairy Low-fat (1%) or fat-free (skim) milk. Fat-free, low-fat, or reduced-fat cheeses. Nonfat, low-sodium ricotta or cottage cheese. Low-fat or nonfat yogurt. Low-fat, low-sodium cheese. Fats and oils Soft margarine without trans fats. Vegetable oil. Low-fat, reduced-fat, or light mayonnaise and salad dressings (reduced-sodium). Canola, safflower, olive, soybean, and sunflower oils. Avocado. Seasoning and other foods Herbs. Spices. Seasoning mixes without salt. Unsalted popcorn and pretzels. Fat-free sweets. What foods are not recommended? The items listed may not be a complete list. Talk with your dietitian about what dietary choices are best for you. Grains Baked goods  made with fat, such as croissants, muffins, or some breads. Dry pasta or rice meal packs. Vegetables Creamed or fried vegetables. Vegetables in a cheese sauce. Regular canned vegetables (not low-sodium or reduced-sodium). Regular canned tomato sauce and paste (not low-sodium or reduced-sodium). Regular tomato and vegetable juice (not low-sodium or reduced-sodium). Pickles. Olives. Fruits Canned fruit in a light or heavy syrup. Fried fruit. Fruit in cream or butter sauce. Meat and other protein foods Fatty cuts of meat. Ribs. Fried meat. Bacon. Sausage. Bologna and other processed lunch meats. Salami. Fatback. Hotdogs. Bratwurst. Salted nuts and seeds. Canned beans with added salt.   Canned or smoked fish. Whole eggs or egg yolks. Chicken or turkey with skin. Dairy Whole or 2% milk, cream, and half-and-half. Whole or full-fat cream cheese. Whole-fat or sweetened yogurt. Full-fat cheese. Nondairy creamers. Whipped toppings. Processed cheese and cheese spreads. Fats and oils Butter. Stick margarine. Lard. Shortening. Ghee. Bacon fat. Tropical oils, such as coconut, palm kernel, or palm oil. Seasoning and other foods Salted popcorn and pretzels. Onion salt, garlic salt, seasoned salt, table salt, and sea salt. Worcestershire sauce. Tartar sauce. Barbecue sauce. Teriyaki sauce. Soy sauce, including reduced-sodium. Steak sauce. Canned and packaged gravies. Fish sauce. Oyster sauce. Cocktail sauce. Horseradish that you find on the shelf. Ketchup. Mustard. Meat flavorings and tenderizers. Bouillon cubes. Hot sauce and Tabasco sauce. Premade or packaged marinades. Premade or packaged taco seasonings. Relishes. Regular salad dressings. Where to find more information:  National Heart, Lung, and Blood Institute: www.nhlbi.nih.gov  American Heart Association: www.heart.org Summary  The DASH eating plan is a healthy eating plan that has been shown to reduce high blood pressure (hypertension). It may also reduce  your risk for type 2 diabetes, heart disease, and stroke.  With the DASH eating plan, you should limit salt (sodium) intake to 2,300 mg a day. If you have hypertension, you may need to reduce your sodium intake to 1,500 mg a day.  When on the DASH eating plan, aim to eat more fresh fruits and vegetables, whole grains, lean proteins, low-fat dairy, and heart-healthy fats.  Work with your health care provider or diet and nutrition specialist (dietitian) to adjust your eating plan to your individual calorie needs. This information is not intended to replace advice given to you by your health care provider. Make sure you discuss any questions you have with your health care provider. Document Revised: 12/20/2016 Document Reviewed: 01/01/2016 Elsevier Patient Education  2020 Elsevier Inc.  

## 2019-06-17 NOTE — Progress Notes (Signed)
Established Patient Office Visit  Subjective:  Patient ID: Angelica [name], female    DOB: September 24, 1960  Age: 59 y.o. MRN: [id number]  CC:  Chief Complaint  Patient presents with  . Diabetes  . hospital followup    stroke  . Hypertension  . Hyperlipidemia  . Dizziness    HPI Angelica [name] presents for follow up after hospital discharge and medication refill. 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 Currently, she walks with a cane, states that she continues to experience intermittent dizziness when she changes position quickly from sitting to standing. She denies vertigo, nausea , tinnitus and fall. She also has residual weakness to right leg, numbness and tingling to her right 3 fingers and mouth. She states that she will follow up with [facility] Neurology on on 06/28/2019 and yet to start Physical Therapy. Her HgbA1c done on 06/15/2019 was 8.1%, she states that she checks her blood glucose bid, and her fasting reading this morning was 99 mg/dl. She denies hypo/hyperglycemic symptoms. Overall, she states that she's improving and offers no further complaint.  Past Medical History:  Diagnosis Date  . Bell palsy 08/04/2014  . GERD (gastroesophageal reflux disease) 01/27/2015  . History of Bell's palsy June 2016  . Hyperlipidemia 01/27/2015  . Hypertension 01/27/2015  . Type 2 diabetes mellitus ([place]) 08/04/2014    Past Surgical History:  Procedure Laterality Date  . ABDOMINAL HYSTERECTOMY    . COLONOSCOPY WITH PROPOFOL N/A 10/07/2018   Procedure: COLONOSCOPY WITH PROPOFOL;  Surgeon: Virgel Manifold, MD;  Location: [facility] ENDOSCOPY;  Service: Gastroenterology;  Laterality: N/A;  . OOPHORECTOMY Right 2005    Family History  Problem Relation Age of Onset  . Cancer Brother        Colon Cancer  . Breast cancer Neg Hx     Social History   Socioeconomic History  . Marital status: Single   Spouse name: Not on file  . Number of children: 2  . Years of education: Not on file  . Highest education level: 11th grade  Occupational History  . Not on file  Tobacco Use  . Smoking status: Never Smoker  . Smokeless tobacco: Never Used  Substance and Sexual Activity  . Alcohol use: No  . Drug use: No  . Sexual activity: Yes    Birth control/protection: Post-menopausal  Other Topics Concern  . 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 Not employed      On Food stamps   Social Determinants of Health   Financial Resource Strain:   . Difficulty of Paying Living Expenses:   Food Insecurity:   . Worried About Charity fundraiser in the Last Year:   . Arboriculturist in the Last Year:   Transportation Needs:   . Film/video editor (Medical):   Marland Kitchen Lack of Transportation (Non-Medical):   Physical Activity:   . Days of Exercise per Week:   . Minutes of Exercise per Session:   Stress:   . Feeling of Stress :   Social Connections:   . Frequency of Communication with Friends and Family:   . Frequency of Social Gatherings with Friends and Family:   . Attends Religious Services:   . Active Member of Clubs or Organizations:   . Attends Archivist Meetings:   Marland Kitchen Marital  Status:   Intimate Partner Violence:   . Fear of Current or Ex-Partner:   . Emotionally Abused:   Marland Kitchen Physically Abused:   . Sexually Abused:     Outpatient Medications Prior to Visit  Medication Sig Dispense Refill  . clopidogrel (PLAVIX) 75 MG tablet Take 1 tablet (75 mg total) by mouth daily. 90 tablet 0  . Insulin Glargine (LANTUS SOLOSTAR) 100 UNIT/ML Solostar Pen Inject 36 Units into the skin daily at 10 pm. Increase by 4 units every 3 days if fasting blood glucose is > 180 mg/dl. (Patient taking differently: Inject 40 Units into the skin daily at 10 pm. ) 30 mL 3  . metFORMIN (GLUCOPHAGE-XR) 500 MG 24 hr tablet Take 2 tablets (1,000  mg total) by mouth daily with breakfast AND 2 tablets (1,000 mg total) daily before supper. 270 tablet 3  . Semaglutide (OZEMPIC) 0.25 or 0.5 MG/DOSE SOPN Inject 0.5 mg into the skin once a week.    Marland Kitchen aspirin EC 81 MG tablet Take 1 tablet (81 mg total) by mouth daily. 90 tablet 3  . atorvastatin (LIPITOR) 40 MG tablet Take 1 tablet (40 mg total) by mouth daily at 6 PM. 90 tablet 3  . hydrochlorothiazide (HYDRODIURIL) 12.5 MG tablet Take 1 tablet (12.5 mg total) by mouth 2 (two) times daily. 90 tablet 3  . omeprazole (PRILOSEC) 20 MG capsule Take 1 capsule (20 mg total) by mouth daily. 90 capsule 3   No facility-administered medications prior to visit.    No Known Allergies  ROS Review of Systems  Constitutional: Negative.   Eyes: Negative.   Respiratory: Negative.   Cardiovascular: Negative.   Endocrine: Negative.   Neurological: Positive for dizziness and weakness.  Hematological: Negative.   Psychiatric/Behavioral: Negative.       Objective:    Physical Exam  HENT:  Head: Normocephalic and atraumatic.  Eyes: Pupils are equal, round, and reactive to light. EOM are normal.  Cardiovascular: Normal rate and regular rhythm.  Pulmonary/Chest: Effort normal and breath sounds normal.  Musculoskeletal:     Comments: Decreased muscle strength to right arm and leg  Neurological: She is alert. No sensory deficit. GCS eye subscore is 4. GCS verbal subscore is 5. GCS motor subscore is 6.  Psychiatric: She has a normal mood and affect. Her behavior is normal. Judgment and thought content normal.    BP 126/86 (BP Location: Left Arm, Patient Position: Sitting)   Pulse 97   Temp 97.8 F (36.6 C)   Ht 5\' 3"  (1.6 m)   Wt 175 lb 8 oz (79.6 kg)   LMP 03/11/2013 (Within Months)   SpO2 94%   BMI 31.09 kg/m  Wt Readings from Last 3 Encounters:  06/17/19 175 lb 8 oz (79.6 kg)  06/14/19 199 lb 8.3 oz (90.5 kg)  05/05/19 195 lb 4.8 oz (88.6 kg)   She was encouraged to continue on her  weight loss regimen.  Health Maintenance Due  Topic Date Due  . COVID-19 Vaccine (1) Never done  . TETANUS/TDAP  Never done    There are no preventive care reminders to display for this patient.  Lab Results  Component Value Date   TSH 1.440 04/28/2019   Lab Results  Component Value Date   WBC 6.9 06/14/2019   HGB 12.9 06/14/2019   HCT 39.2 06/14/2019   MCV 80.5 06/14/2019   PLT 338 06/14/2019   Lab Results  Component Value Date   NA 139 06/14/2019   K 3.8  06/14/2019   CO2 26 06/14/2019   GLUCOSE 141 (H) 06/14/2019   BUN 12 06/14/2019   CREATININE 0.61 06/14/2019   BILITOT 0.6 06/14/2019   ALKPHOS 113 06/14/2019   AST 18 06/14/2019   ALT 23 06/14/2019   PROT 8.0 06/14/2019   ALBUMIN 3.8 06/14/2019   CALCIUM 9.2 06/14/2019   ANIONGAP 7 06/14/2019   Lab Results  Component Value Date   CHOL 177 06/15/2019   Lab Results  Component Value Date   HDL 35 (L) 06/15/2019   Lab Results  Component Value Date   LDLCALC 117 (H) 06/15/2019   Lab Results  Component Value Date   TRIG 127 06/15/2019   Lab Results  Component Value Date   CHOLHDL 5.1 06/15/2019   Lab Results  Component Value Date   HGBA1C 8.1 (H) 06/15/2019      Assessment & Plan:    1. Hypercholesterolemia - She will continue on current treatment regimen , advised to continue on low fat/low cholesterol diet. - atorvastatin (LIPITOR) 40 MG tablet; Take 1 tablet (40 mg total) by mouth daily at 6 PM.  Dispense: 90 tablet; Refill: 0  2. Hypertension, unspecified type - She will continue on current treatment regimen, advised to continue on DASH diet and exercise as tolerated. - aspirin EC 81 MG tablet; Take 1 tablet (81 mg total) by mouth daily.  Dispense: 90 tablet; Refill: 0 - hydrochlorothiazide (HYDRODIURIL) 25 MG tablet; Take 1 tablet (25 mg total) by mouth daily. Take 1 tablet 25 mg by mouth daily  Dispense: 90 tablet; Refill: 0  3. Gastroesophageal reflux disease, unspecified whether  esophagitis present - Her GERD is under control, she will continue on current treatment regimen. -Avoid spicy, fatty and fried food -Avoid sodas and sour juices -Avoid heavy meals -Avoid eating 4 hours before bedtime -Elevate head of bed at night - omeprazole (PRILOSEC) 20 MG capsule; Take 1 capsule (20 mg total) by mouth daily.  Dispense: 90 capsule; Refill: 0  4. Diabetes mellitus without complication ([place]) - Her HgbA1c was 8.1%, her goal should be less than 7%. She will continue on her current treatment regimen, low carb/non concentrated sweet diet.  5. Cerebrovascular accident (CVA), unspecified mechanism ([place]) - She was advised to follow up with [facility] Neurology team, and Physical Therapy. She was advised to go to the ED and notify clinic for worsening Dizziness and Weakness.  6. Hospital discharge follow-up - She will continue all her discharge  Medications and instructions, was advised to notify clinic.    Follow-up: Return in about 1 month (around 07/20/2019), or if symptoms worsen or fail to improve.    Aurelie Dicenzo Jerold Coombe, NP

## 2019-06-22 ENCOUNTER — Other Ambulatory Visit: Payer: Self-pay

## 2019-06-22 MED ORDER — PANTOPRAZOLE SODIUM 40 MG PO TBEC
40.0000 mg | DELAYED_RELEASE_TABLET | Freq: Every day | ORAL | 2 refills | Status: DC
Start: 2019-06-22 — End: 2019-11-16

## 2019-06-23 ENCOUNTER — Telehealth: Payer: Self-pay | Admitting: Gerontology

## 2019-06-23 ENCOUNTER — Other Ambulatory Visit: Payer: Self-pay | Admitting: Gerontology

## 2019-06-23 DIAGNOSIS — K5900 Constipation, unspecified: Secondary | ICD-10-CM

## 2019-06-23 MED ORDER — DOCUSATE SODIUM 100 MG PO CAPS: 100.0000 mg | ORAL_CAPSULE | Freq: Two times a day (BID) | ORAL | 0 refills | Status: DC

## 2019-06-23 NOTE — Telephone Encounter (Signed)
Angelica [name] called stating that she has not moved her bowel in one week, and experiencing constant headache. She states that she took 2 tabs of Aleve with no relief. She reports intermittent dizziness, numbness to lower lips, and denies weakness, vision changes, loss of balance. She was advised to take colace 100 mg bid, increase water intake and dietary fiber. She will take 2 tabs of tylenol every 8-12 hours for headache and was advised to go to the ED with worsening symptoms.

## 2019-07-19 ENCOUNTER — Telehealth: Payer: Self-pay | Admitting: Pharmacist

## 2019-07-19 NOTE — Telephone Encounter (Signed)
07/19/2019 11:00:58 AM - Ozempic refill to [facility]  -- Elmer Picker - Monday, July 19, 2019 11:00 AM --[facility] refill request for [employer] 0.5mg  once weekly #5 boxes to [facility].

## 2019-07-19 NOTE — Telephone Encounter (Signed)
07/19/2019 8:45:49 AM - Lantus Solostar renewal to pt & dr  -- Elmer Picker - Monday, July 19, 2019 8:44 AM --Mailing [employer] renewal to patient and sending to [facility] for Lantus Solostar.

## 2019-07-20 ENCOUNTER — Encounter: Payer: Self-pay | Admitting: Gerontology

## 2019-07-20 ENCOUNTER — Other Ambulatory Visit: Payer: Self-pay

## 2019-07-20 ENCOUNTER — Telehealth: Payer: Self-pay | Admitting: Pharmacist

## 2019-07-20 ENCOUNTER — Ambulatory Visit: Payer: Medicaid Other | Admitting: Gerontology

## 2019-07-20 VITALS — BP 111/78 | HR 79 | Ht 63.0 in | Wt 195.9 lb

## 2019-07-20 DIAGNOSIS — G4700 Insomnia, unspecified: Secondary | ICD-10-CM

## 2019-07-20 DIAGNOSIS — F419 Anxiety disorder, unspecified: Secondary | ICD-10-CM

## 2019-07-20 DIAGNOSIS — R29898 Other symptoms and signs involving the musculoskeletal system: Secondary | ICD-10-CM | POA: Insufficient documentation

## 2019-07-20 DIAGNOSIS — I10 Essential (primary) hypertension: Secondary | ICD-10-CM

## 2019-07-20 DIAGNOSIS — E1165 Type 2 diabetes mellitus with hyperglycemia: Secondary | ICD-10-CM

## 2019-07-20 MED ORDER — HYDROCHLOROTHIAZIDE 12.5 MG PO TABS: 12.5000 mg | ORAL_TABLET | Freq: Every day | ORAL | 0 refills | Status: DC

## 2019-07-20 MED ORDER — LANTUS SOLOSTAR 100 UNIT/ML ~~LOC~~ SOPN: 38.0000 [IU] | PEN_INJECTOR | Freq: Every day | SUBCUTANEOUS | 3 refills | Status: DC

## 2019-07-20 NOTE — Progress Notes (Signed)
Established Patient Office Visit  Subjective:  Patient ID: Angelica [name], female    DOB: 07-24-1960  Age: 59 y.o. MRN: [id number]  CC:  Chief Complaint  Patient presents with  . lab follow up    HPI Angelica [name] presents for follow up of type 2 diabetes,constipation, numbness. Her last HgbA1c done on 06/28/2019 at [facility] was 7.8%, Currently on oral medications Metformin 1000 mg bid, Lantus insulin 38 units at bedtime, and Ozempic 0.5 mg weekly. She checks her blood glucose every other day and states that it was 163 mg/dl. It was 136 mg/dl when checked during visit. She denies hypoglycemia/hyperglycemia, and she is compliant with ADA diet, exercises as tolerated and performs daily foot checks. She has not followed up with Podiatry, denies peripheral neuropathy or eye disease. UTD with dental and eye exams. Currently on statin Lipitor 40 mg daily. She also states that her constipation and numbness to her fingers has resolved. She states that she continues to experience intermittent weakness to her right lower leg and it feels like it's giving out sometimes. She completed her Physical Therapy and states that her Therapist recommended more PT sessions.  She followed up with [facility] internal medicine on 6/7/2021by Dr Dorena Dew T.F. She was treated for headache localized to her forehead and was started on 40 mg Propranolol daily for prophylaxis and continue on hydrochlorothiazide 12.5 mg daily. She states that her headache has improved 80% , but continues to experience intermittent dizziness, but was taking 25 mg HCTZ.  She also states that she's been anxious and gets angry easily. She states that her mood fluctuates, denies suicidal nor homicidal ideation. She also c/o experiencing difficulty falling and staying asleep. Overall, she states that she's doing well and offers no further complaint.  Past Medical History:  Diagnosis Date  . Bell palsy 08/04/2014  . GERD (gastroesophageal reflux disease)  01/27/2015  . History of Bell's palsy June 2016  . Hyperlipidemia 01/27/2015  . Hypertension 01/27/2015  . Type 2 diabetes mellitus ([place]) 08/04/2014    Past Surgical History:  Procedure Laterality Date  . ABDOMINAL HYSTERECTOMY    . COLONOSCOPY WITH PROPOFOL N/A 10/07/2018   Procedure: COLONOSCOPY WITH PROPOFOL;  Surgeon: Virgel Manifold, MD;  Location: [facility] ENDOSCOPY;  Service: Gastroenterology;  Laterality: N/A;  . OOPHORECTOMY Right 2005    Family History  Problem Relation Age of Onset  . Cancer Brother        Colon Cancer  . Breast cancer Neg Hx     Social History   Socioeconomic History  . Marital status: Single    Spouse name: Not on file  . Number of children: 2  . Years of education: Not on file  . Highest education level: 11th grade  Occupational History  . Not on file  Tobacco Use  . Smoking status: Never Smoker  . Smokeless tobacco: Never Used  Vaping Use  . Vaping Use: Never used  Substance and Sexual Activity  . Alcohol use: No  . Drug use: No  . Sexual activity: Yes    Birth control/protection: Post-menopausal  Other Topics Concern  . 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 Not employed      On Food stamps   Social Determinants of Health   Financial Resource Strain:   . Difficulty of Paying Living Expenses:   Food Insecurity:   . Worried About Charity fundraiser in the  Last Year:   . [place] in the Last Year:   Transportation Needs:   . Film/video editor (Medical):   Marland Kitchen Lack of Transportation (Non-Medical):   Physical Activity:   . Days of Exercise per Week:   . Minutes of Exercise per Session:   Stress:   . Feeling of Stress :   Social Connections:   . Frequency of Communication with Friends and Family:   . Frequency of Social Gatherings with Friends and Family:   . Attends Religious Services:   . Active Member of Clubs or Organizations:   . Attends Theatre manager Meetings:   Marland Kitchen Marital Status:   Intimate Partner Violence:   . Fear of Current or Ex-Partner:   . Emotionally Abused:   Marland Kitchen Physically Abused:   . Sexually Abused:     Outpatient Medications Prior to Visit  Medication Sig Dispense Refill  . atorvastatin (LIPITOR) 40 MG tablet Take 1 tablet (40 mg total) by mouth daily at 6 PM. 90 tablet 0  . clopidogrel (PLAVIX) 75 MG tablet Take 1 tablet (75 mg total) by mouth daily. 90 tablet 0  . docusate sodium (COLACE) 100 MG capsule Take 1 capsule (100 mg total) by mouth 2 (two) times daily. 30 capsule 0  . metFORMIN (GLUCOPHAGE-XR) 500 MG 24 hr tablet Take 2 tablets (1,000 mg total) by mouth daily with breakfast AND 2 tablets (1,000 mg total) daily before supper. 270 tablet 3  . pantoprazole (PROTONIX) 40 MG tablet Take 1 tablet (40 mg total) by mouth daily. 40 tablet 2  . Semaglutide (OZEMPIC) 0.25 or 0.5 MG/DOSE SOPN Inject 0.5 mg into the skin once a week.    . hydrochlorothiazide (HYDRODIURIL) 25 MG tablet Take 1 tablet (25 mg total) by mouth daily. Take 1 tablet 25 mg by mouth daily 90 tablet 0  . Insulin Glargine (LANTUS SOLOSTAR) 100 UNIT/ML Solostar Pen Inject 36 Units into the skin daily at 10 pm. Increase by 4 units every 3 days if fasting blood glucose is > 180 mg/dl. (Patient taking differently: Inject 40 Units into the skin daily at 10 pm. ) 30 mL 3  . aspirin EC 81 MG tablet Take 1 tablet (81 mg total) by mouth daily. (Patient not taking: Reported on 07/20/2019) 90 tablet 0   No facility-administered medications prior to visit.    No Known Allergies  ROS Review of Systems  Constitutional: Negative.   Eyes: Negative.   Respiratory: Negative.   Cardiovascular: Negative.   Endocrine: Negative.   Neurological: Positive for weakness (right leg).  Psychiatric/Behavioral: Positive for sleep disturbance. The patient is nervous/anxious.       Objective:    Physical Exam Constitutional:      Appearance: Normal  appearance.  HENT:     Head: Normocephalic and atraumatic.  Eyes:     Extraocular Movements: Extraocular movements intact.     Conjunctiva/sclera: Conjunctivae normal.     Pupils: Pupils are equal, round, and reactive to light.  Cardiovascular:     Rate and Rhythm: Normal rate and regular rhythm.     Pulses: Normal pulses.     Heart sounds: Normal heart sounds.  Pulmonary:     Breath sounds: Normal breath sounds.  Musculoskeletal:        General: Normal range of motion.  Skin:    General: Skin is warm and dry.  Neurological:     General: No focal deficit present.     Mental Status: She is  alert and oriented to person, place, and time. Mental status is at baseline.  Psychiatric:        Mood and Affect: Mood normal.        Behavior: Behavior normal.        Thought Content: Thought content normal.     BP 111/78 (BP Location: Left Arm, Patient Position: Sitting)   Pulse 79   Ht 5\' 3"  (1.6 m)   Wt 195 lb 14.4 oz (88.9 kg)   LMP 03/11/2013 (Within Months)   SpO2 96%   BMI 34.70 kg/m  Wt Readings from Last 3 Encounters:  07/20/19 195 lb 14.4 oz (88.9 kg)  06/17/19 175 lb 8 oz (79.6 kg)  06/14/19 199 lb 8.3 oz (90.5 kg)   She gained 20 pounds in one month, and was encouraged to continue on her weight loss regimen.  Health Maintenance Due  Topic Date Due  . COVID-19 Vaccine (1) Never done  . TETANUS/TDAP  Never done      Lab Results  Component Value Date   TSH 1.440 04/28/2019   Lab Results  Component Value Date   WBC 6.9 06/14/2019   HGB 12.9 06/14/2019   HCT 39.2 06/14/2019   MCV 80.5 06/14/2019   PLT 338 06/14/2019   Lab Results  Component Value Date   NA 139 06/14/2019   K 3.8 06/14/2019   CO2 26 06/14/2019   GLUCOSE 141 (H) 06/14/2019   BUN 12 06/14/2019   CREATININE 0.61 06/14/2019   BILITOT 0.6 06/14/2019   ALKPHOS 113 06/14/2019   AST 18 06/14/2019   ALT 23 06/14/2019   PROT 8.0 06/14/2019   ALBUMIN 3.8 06/14/2019   CALCIUM 9.2 06/14/2019    ANIONGAP 7 06/14/2019   Lab Results  Component Value Date   CHOL 177 06/15/2019   Lab Results  Component Value Date   HDL 35 (L) 06/15/2019   Lab Results  Component Value Date   LDLCALC 117 (H) 06/15/2019   Lab Results  Component Value Date   TRIG 127 06/15/2019   Lab Results  Component Value Date   CHOLHDL 5.1 06/15/2019   Lab Results  Component Value Date   HGBA1C 8.1 (H) 06/15/2019      Assessment & Plan:    1. Type 2 diabetes mellitus with hyperglycemia, with long-term current use of insulin (HCC)  - insulin glargine (LANTUS SOLOSTAR) 100 UNIT/ML Solostar Pen; Inject 38 Units into the skin daily at 10 pm. Increase by 4 units every 3 days if fasting blood glucose is > 180 mg/dl.  Dispense: 30 mL; Refill: 3 -She was advised to check her fasting blood glucose daily, her reading should be between 80-130 mg/dl. She was advised to record and bring log to follow up appointment. She was encouraged to continue on low carb/non concentrated sweet diet and exercise as tolerated.   2. Anxiety She will follow up with Ms. Simpson for mental health evaluation, she was advised to notify Crisis help line or go to the ED for worsening symptoms.  3. Insomnia, unspecified type -She will follow up with Ms. Simpson for mental health evaluation, - She was advised to take otc 3 mg Melatonin.  4. Hypertension, unspecified type  - hydrochlorothiazide (HYDRODIURIL) 12.5 MG tablet; Take 1 tablet (12.5 mg total) by mouth daily.  Dispense: 90 tablet; Refill: 0 -Low salt DASH diet -Take medications regularly on time -Exercise regularly as tolerated -Check blood pressure at least once a week at home or a nearby pharmacy and  record and bring log to follow up appointment. -Goal is less than 140/90 and normal blood pressure is 120/80   1. Type 2 diabetes mellitus with hyperglycemia, with long-term current use of insulin (HCC)  - insulin glargine (LANTUS SOLOSTAR) 100 UNIT/ML Solostar Pen;  Inject 38 Units into the skin daily at 10 pm. Increase by 4 units every 3 days if fasting blood glucose is > 180 mg/dl.  Dispense: 30 mL; Refill: 3   5. Weakness of right leg - She was advised to ambulate with cane and will be referred to [facility] for Physical Therapy. She was advised to notify clinic for worsening symptoms.    Follow-up: Return in about 6 weeks (around 08/31/2019), or if symptoms worsen or fail to improve.    Camala Talwar Jerold Coombe, NP

## 2019-07-20 NOTE — Patient Instructions (Signed)
Carbohydrate Counting for Diabetes Mellitus, Adult  Carbohydrate counting is a method of keeping track of how many carbohydrates you eat. Eating carbohydrates naturally increases the amount of sugar (glucose) in the blood. Counting how many carbohydrates you eat helps keep your blood glucose within normal limits, which helps you manage your diabetes (diabetes mellitus). It is important to know how many carbohydrates you can safely have in each meal. This is different for every person. A diet and nutrition specialist (registered dietitian) can help you make a meal plan and calculate how many carbohydrates you should have at each meal and snack. Carbohydrates are found in the following foods:  Grains, such as breads and cereals.  Dried beans and soy products.  Starchy vegetables, such as potatoes, peas, and corn.  Fruit and fruit juices.  Milk and yogurt.  Sweets and snack foods, such as cake, cookies, candy, chips, and soft drinks. How do I count carbohydrates? There are two ways to count carbohydrates in food. You can use either of the methods or a combination of both. Reading "Nutrition Facts" on packaged food The "Nutrition Facts" list is included on the labels of almost all packaged foods and beverages in the [place] It includes:  The serving size.  Information about nutrients in each serving, including the grams (g) of carbohydrate per serving. To use the "Nutrition Facts":  Decide how many servings you will have.  Multiply the number of servings by the number of carbohydrates per serving.  The resulting number is the total amount of carbohydrates that you will be having. Learning standard serving sizes of other foods When you eat carbohydrate foods that are not packaged or do not include "Nutrition Facts" on the label, you need to measure the servings in order to count the amount of carbohydrates:  Measure the foods that you will eat with a food scale or measuring cup, if needed.   Decide how many standard-size servings you will eat.  Multiply the number of servings by 15. Most carbohydrate-rich foods have about 15 g of carbohydrates per serving. ? For example, if you eat 8 oz (170 g) of strawberries, you will have eaten 2 servings and 30 g of carbohydrates (2 servings x 15 g = 30 g).  For foods that have more than one food mixed, such as soups and casseroles, you must count the carbohydrates in each food that is included. The following list contains standard serving sizes of common carbohydrate-rich foods. Each of these servings has about 15 g of carbohydrates:   hamburger bun or  English muffin.   oz (15 mL) syrup.   oz (14 g) jelly.  1 slice of bread.  1 six-inch tortilla.  3 oz (85 g) cooked rice or pasta.  4 oz (113 g) cooked dried beans.  4 oz (113 g) starchy vegetable, such as peas, corn, or potatoes.  4 oz (113 g) hot cereal.  4 oz (113 g) mashed potatoes or  of a large baked potato.  4 oz (113 g) canned or frozen fruit.  4 oz (120 mL) fruit juice.  4-6 crackers.  6 chicken nuggets.  6 oz (170 g) unsweetened dry cereal.  6 oz (170 g) plain fat-free yogurt or yogurt sweetened with artificial sweeteners.  8 oz (240 mL) milk.  8 oz (170 g) fresh fruit or one small piece of fruit.  24 oz (680 g) popped popcorn. Example of carbohydrate counting Sample meal  3 oz (85 g) chicken breast.  6 oz (170 g)   brown rice.  4 oz (113 g) corn.  8 oz (240 mL) milk.  8 oz (170 g) strawberries with sugar-free whipped topping. Carbohydrate calculation 1. Identify the foods that contain carbohydrates: ? Rice. ? Corn. ? Milk. ? Strawberries. 2. Calculate how many servings you have of each food: ? 2 servings rice. ? 1 serving corn. ? 1 serving milk. ? 1 serving strawberries. 3. Multiply each number of servings by 15 g: ? 2 servings rice x 15 g = 30 g. ? 1 serving corn x 15 g = 15 g. ? 1 serving milk x 15 g = 15 g. ? 1 serving  strawberries x 15 g = 15 g. 4. Add together all of the amounts to find the total grams of carbohydrates eaten: ? 30 g + 15 g + 15 g + 15 g = 75 g of carbohydrates total. Summary  Carbohydrate counting is a method of keeping track of how many carbohydrates you eat.  Eating carbohydrates naturally increases the amount of sugar (glucose) in the blood.  Counting how many carbohydrates you eat helps keep your blood glucose within normal limits, which helps you manage your diabetes.  A diet and nutrition specialist (registered dietitian) can help you make a meal plan and calculate how many carbohydrates you should have at each meal and snack. This information is not intended to replace advice given to you by your health care provider. Make sure you discuss any questions you have with your health care provider. Document Revised: 08/01/2016 Document Reviewed: 06/21/2015 Elsevier Patient Education  2020 Elsevier Inc. DASH Eating Plan DASH stands for "Dietary Approaches to Stop Hypertension." The DASH eating plan is a healthy eating plan that has been shown to reduce high blood pressure (hypertension). It may also reduce your risk for type 2 diabetes, heart disease, and stroke. The DASH eating plan may also help with weight loss. What are tips for following this plan?  General guidelines  Avoid eating more than 2,300 mg (milligrams) of salt (sodium) a day. If you have hypertension, you may need to reduce your sodium intake to 1,500 mg a day.  Limit alcohol intake to no more than 1 drink a day for nonpregnant women and 2 drinks a day for men. One drink equals 12 oz of beer, 5 oz of wine, or 1 oz of hard liquor.  Work with your health care provider to maintain a healthy body weight or to lose weight. Ask what an ideal weight is for you.  Get at least 30 minutes of exercise that causes your heart to beat faster (aerobic exercise) most days of the week. Activities may include walking, swimming, or  biking.  Work with your health care provider or diet and nutrition specialist (dietitian) to adjust your eating plan to your individual calorie needs. Reading food labels   Check food labels for the amount of sodium per serving. Choose foods with less than 5 percent of the Daily Value of sodium. Generally, foods with less than 300 mg of sodium per serving fit into this eating plan.  To find whole grains, look for the word "whole" as the first word in the ingredient list. Shopping  Buy products labeled as "low-sodium" or "no salt added."  Buy fresh foods. Avoid canned foods and premade or frozen meals. Cooking  Avoid adding salt when cooking. Use salt-free seasonings or herbs instead of table salt or sea salt. Check with your health care provider or pharmacist before using salt substitutes.  Do not   fry foods. Cook foods using healthy methods such as baking, boiling, grilling, and broiling instead.  Cook with heart-healthy oils, such as olive, canola, soybean, or sunflower oil. Meal planning  Eat a balanced diet that includes: ? 5 or more servings of fruits and vegetables each day. At each meal, try to fill half of your plate with fruits and vegetables. ? Up to 6-8 servings of whole grains each day. ? Less than 6 oz of lean meat, poultry, or fish each day. A 3-oz serving of meat is about the same size as a deck of cards. One egg equals 1 oz. ? 2 servings of low-fat dairy each day. ? A serving of nuts, seeds, or beans 5 times each week. ? Heart-healthy fats. Healthy fats called Omega-3 fatty acids are found in foods such as flaxseeds and coldwater fish, like sardines, salmon, and mackerel.  Limit how much you eat of the following: ? Canned or prepackaged foods. ? Food that is high in trans fat, such as fried foods. ? Food that is high in saturated fat, such as fatty meat. ? Sweets, desserts, sugary drinks, and other foods with added sugar. ? Full-fat dairy products.  Do not salt  foods before eating.  Try to eat at least 2 vegetarian meals each week.  Eat more home-cooked food and less restaurant, buffet, and fast food.  When eating at a restaurant, ask that your food be prepared with less salt or no salt, if possible. What foods are recommended? The items listed may not be a complete list. Talk with your dietitian about what dietary choices are best for you. Grains Whole-grain or whole-wheat bread. Whole-grain or whole-wheat pasta. Brown rice. Oatmeal. Quinoa. Bulgur. Whole-grain and low-sodium cereals. Pita bread. Low-fat, low-sodium crackers. Whole-wheat flour tortillas. Vegetables Fresh or frozen vegetables (raw, steamed, roasted, or grilled). Low-sodium or reduced-sodium tomato and vegetable juice. Low-sodium or reduced-sodium tomato sauce and tomato paste. Low-sodium or reduced-sodium canned vegetables. Fruits All fresh, dried, or frozen fruit. Canned fruit in natural juice (without added sugar). Meat and other protein foods Skinless chicken or turkey. Ground chicken or turkey. Pork with fat trimmed off. Fish and seafood. Egg whites. Dried beans, peas, or lentils. Unsalted nuts, nut butters, and seeds. Unsalted canned beans. Lean cuts of beef with fat trimmed off. Low-sodium, lean deli meat. Dairy Low-fat (1%) or fat-free (skim) milk. Fat-free, low-fat, or reduced-fat cheeses. Nonfat, low-sodium ricotta or cottage cheese. Low-fat or nonfat yogurt. Low-fat, low-sodium cheese. Fats and oils Soft margarine without trans fats. Vegetable oil. Low-fat, reduced-fat, or light mayonnaise and salad dressings (reduced-sodium). Canola, safflower, olive, soybean, and sunflower oils. Avocado. Seasoning and other foods Herbs. Spices. Seasoning mixes without salt. Unsalted popcorn and pretzels. Fat-free sweets. What foods are not recommended? The items listed may not be a complete list. Talk with your dietitian about what dietary choices are best for you. Grains Baked goods  made with fat, such as croissants, muffins, or some breads. Dry pasta or rice meal packs. Vegetables Creamed or fried vegetables. Vegetables in a cheese sauce. Regular canned vegetables (not low-sodium or reduced-sodium). Regular canned tomato sauce and paste (not low-sodium or reduced-sodium). Regular tomato and vegetable juice (not low-sodium or reduced-sodium). Pickles. Olives. Fruits Canned fruit in a light or heavy syrup. Fried fruit. Fruit in cream or butter sauce. Meat and other protein foods Fatty cuts of meat. Ribs. Fried meat. Bacon. Sausage. Bologna and other processed lunch meats. Salami. Fatback. Hotdogs. Bratwurst. Salted nuts and seeds. Canned beans with added salt.   Canned or smoked fish. Whole eggs or egg yolks. Chicken or turkey with skin. Dairy Whole or 2% milk, cream, and half-and-half. Whole or full-fat cream cheese. Whole-fat or sweetened yogurt. Full-fat cheese. Nondairy creamers. Whipped toppings. Processed cheese and cheese spreads. Fats and oils Butter. Stick margarine. Lard. Shortening. Ghee. Bacon fat. Tropical oils, such as coconut, palm kernel, or palm oil. Seasoning and other foods Salted popcorn and pretzels. Onion salt, garlic salt, seasoned salt, table salt, and sea salt. Worcestershire sauce. Tartar sauce. Barbecue sauce. Teriyaki sauce. Soy sauce, including reduced-sodium. Steak sauce. Canned and packaged gravies. Fish sauce. Oyster sauce. Cocktail sauce. Horseradish that you find on the shelf. Ketchup. Mustard. Meat flavorings and tenderizers. Bouillon cubes. Hot sauce and Tabasco sauce. Premade or packaged marinades. Premade or packaged taco seasonings. Relishes. Regular salad dressings. Where to find more information:  National Heart, Lung, and Blood Institute: www.nhlbi.nih.gov  American Heart Association: www.heart.org Summary  The DASH eating plan is a healthy eating plan that has been shown to reduce high blood pressure (hypertension). It may also reduce  your risk for type 2 diabetes, heart disease, and stroke.  With the DASH eating plan, you should limit salt (sodium) intake to 2,300 mg a day. If you have hypertension, you may need to reduce your sodium intake to 1,500 mg a day.  When on the DASH eating plan, aim to eat more fresh fruits and vegetables, whole grains, lean proteins, low-fat dairy, and heart-healthy fats.  Work with your health care provider or diet and nutrition specialist (dietitian) to adjust your eating plan to your individual calorie needs. This information is not intended to replace advice given to you by your health care provider. Make sure you discuss any questions you have with your health care provider. Document Revised: 12/20/2016 Document Reviewed: 01/01/2016 Elsevier Patient Education  2020 Elsevier Inc.  

## 2019-07-20 NOTE — Telephone Encounter (Signed)
07/20/2019 2:35:57 PM - Lantus Solostar dose increase to provider  -- Elmer Picker - Tuesday, July 20, 2019 2:33 PM --Received a pharmacy printout from [facility] for Lantus Solostar Inject 38 units under the skin daily at 10pm-Increase by 4 units every 3 days if fasting blood sugar is >180.  Previous 32 units daily. I have put corrected form in [facility] folder for Dr. Mable Fill to sign.

## 2019-07-27 ENCOUNTER — Institutional Professional Consult (permissible substitution): Payer: Medicaid Other | Admitting: Licensed Clinical Social Worker

## 2019-07-28 ENCOUNTER — Other Ambulatory Visit: Payer: Medicaid Other

## 2019-07-28 ENCOUNTER — Other Ambulatory Visit: Payer: Self-pay

## 2019-07-28 DIAGNOSIS — E1165 Type 2 diabetes mellitus with hyperglycemia: Secondary | ICD-10-CM

## 2019-07-29 LAB — HEMOGLOBIN A1C
Est. average glucose Bld gHb Est-mCnc: 186 mg/dL
Hgb A1c MFr Bld: 8.1 % — ABNORMAL HIGH (ref 4.8–5.6)

## 2019-07-30 ENCOUNTER — Telehealth: Payer: Self-pay | Admitting: Pharmacist

## 2019-07-30 NOTE — Telephone Encounter (Signed)
07/30/2019 2:36:48 PM - Ozempic refill faxed to [place] - Friday, July 30, 2019 2:35 PM --[employer] refill for [employer] 0.5mg  once weekly  #5 boxes.

## 2019-07-30 NOTE — Telephone Encounter (Signed)
07/30/2019 2:46:49 PM - Lantus Solostar pending  -- Elmer Picker - Friday, July 30, 2019 2:45 PM --Lantus Solostar pending patient to return paperwork mailed to her 07/19/19--time for patient to renew with [employer] by the end of July. I have received the patient portion back signed.

## 2019-08-04 ENCOUNTER — Other Ambulatory Visit: Payer: Self-pay

## 2019-08-04 ENCOUNTER — Encounter: Payer: Self-pay | Admitting: Gerontology

## 2019-08-04 ENCOUNTER — Telehealth: Payer: Self-pay | Admitting: Pharmacist

## 2019-08-04 ENCOUNTER — Ambulatory Visit: Payer: Medicaid Other | Admitting: Gerontology

## 2019-08-04 VITALS — BP 136/86 | HR 84 | Ht 63.0 in | Wt 195.0 lb

## 2019-08-04 DIAGNOSIS — E119 Type 2 diabetes mellitus without complications: Secondary | ICD-10-CM

## 2019-08-04 DIAGNOSIS — E7800 Pure hypercholesterolemia, unspecified: Secondary | ICD-10-CM

## 2019-08-04 DIAGNOSIS — K0889 Other specified disorders of teeth and supporting structures: Secondary | ICD-10-CM | POA: Insufficient documentation

## 2019-08-04 DIAGNOSIS — F419 Anxiety disorder, unspecified: Secondary | ICD-10-CM

## 2019-08-04 DIAGNOSIS — I10 Essential (primary) hypertension: Secondary | ICD-10-CM

## 2019-08-04 NOTE — Progress Notes (Signed)
Established Patient Office Visit  Subjective:  Patient ID: Angelica [name], female    DOB: 1960/12/26  Age: 59 y.o. MRN: [id number]  CC:  Chief Complaint  Patient presents with  . Diabetes    HPI [name] presents for follow up of hypertension. Her HCTZ was decreased to 12.5 mg due to dizziness. She also takes 40 mg Propranolol for Headache prophylaxis and she states that her dizziness has resolved. Currently, she c/o having a loose lower mandibular wisdom tooth and will follow up with the Dentist on Monday. She states that she's compliant with her medications and continues to make healthy life style modifications. Sh reports that she will follow up with Physical Therapy at [facility] clinic for right lower extremity weakness when their is availability. Overall, she reports that she's doing well and offers no further complaint.  Past Medical History:  Diagnosis Date  . Bell palsy 08/04/2014  . GERD (gastroesophageal reflux disease) 01/27/2015  . History of Bell's palsy June 2016  . Hyperlipidemia 01/27/2015  . Hypertension 01/27/2015  . Type 2 diabetes mellitus ([place]) 08/04/2014    Past Surgical History:  Procedure Laterality Date  . ABDOMINAL HYSTERECTOMY    . COLONOSCOPY WITH PROPOFOL N/A 10/07/2018   Procedure: COLONOSCOPY WITH PROPOFOL;  Surgeon: Virgel Manifold, MD;  Location: [facility] ENDOSCOPY;  Service: Gastroenterology;  Laterality: N/A;  . OOPHORECTOMY Right 2005    Family History  Problem Relation Age of Onset  . Cancer Brother        Colon Cancer  . Breast cancer Neg Hx     Social History   Socioeconomic History  . Marital status: Single    Spouse name: Not on file  . Number of children: 2  . Years of education: Not on file  . Highest education level: 11th grade  Occupational History  . Not on file  Tobacco Use  . Smoking status: Never Smoker  . Smokeless tobacco: Never Used  Vaping Use  . Vaping Use: Never used  Substance and Sexual Activity  . Alcohol  use: No  . Drug use: No  . Sexual activity: Yes    Birth control/protection: Post-menopausal  Other Topics Concern  . 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 Not employed      On Food stamps   Social Determinants of Health   Financial Resource Strain:   . Difficulty of Paying Living Expenses:   Food Insecurity:   . Worried About Charity fundraiser in the Last Year:   . Arboriculturist in the Last Year:   Transportation Needs:   . Film/video editor (Medical):   Marland Kitchen Lack of Transportation (Non-Medical):   Physical Activity:   . Days of Exercise per Week:   . Minutes of Exercise per Session:   Stress:   . Feeling of Stress :   Social Connections:   . Frequency of Communication with Friends and Family:   . Frequency of Social Gatherings with Friends and Family:   . Attends Religious Services:   . Active Member of Clubs or Organizations:   . Attends Archivist Meetings:   Marland Kitchen Marital Status:   Intimate Partner Violence:   . Fear of Current or Ex-Partner:   . Emotionally Abused:   Marland Kitchen Physically Abused:   . Sexually Abused:     Outpatient Medications Prior to Visit  Medication Sig Dispense Refill  .  atorvastatin (LIPITOR) 40 MG tablet Take 1 tablet (40 mg total) by mouth daily at 6 PM. 90 tablet 0  . clopidogrel (PLAVIX) 75 MG tablet Take 1 tablet (75 mg total) by mouth daily. 90 tablet 0  . docusate sodium (COLACE) 100 MG capsule Take 1 capsule (100 mg total) by mouth 2 (two) times daily. 30 capsule 0  . hydrochlorothiazide (HYDRODIURIL) 12.5 MG tablet Take 1 tablet (12.5 mg total) by mouth daily. 90 tablet 0  . insulin glargine (LANTUS SOLOSTAR) 100 UNIT/ML Solostar Pen Inject 38 Units into the skin daily at 10 pm. Increase by 4 units every 3 days if fasting blood glucose is > 180 mg/dl. 30 mL 3  . metFORMIN (GLUCOPHAGE-XR) 500 MG 24 hr tablet Take 2 tablets (1,000 mg total) by mouth daily with  breakfast AND 2 tablets (1,000 mg total) daily before supper. 270 tablet 3  . Semaglutide (OZEMPIC) 0.25 or 0.5 MG/DOSE SOPN Inject 0.5 mg into the skin once a week.    Marland Kitchen aspirin EC 81 MG tablet Take 1 tablet (81 mg total) by mouth daily. 90 tablet 0  . pantoprazole (PROTONIX) 40 MG tablet Take 1 tablet (40 mg total) by mouth daily. 40 tablet 2   No facility-administered medications prior to visit.    No Known Allergies  ROS Review of Systems  Constitutional: Negative.   HENT: Positive for dental problem.   Respiratory: Negative.   Cardiovascular: Negative.   Gastrointestinal: Negative.   Endocrine: Negative.   Neurological: Negative.   Psychiatric/Behavioral: Negative.       Objective:    Physical Exam HENT:     Head: Normocephalic and atraumatic.     Mouth/Throat:     Comments: Loose left lower mandibular tooth. Eyes:     Extraocular Movements: Extraocular movements intact.     Pupils: Pupils are equal, round, and reactive to light.  Cardiovascular:     Rate and Rhythm: Normal rate and regular rhythm.     Pulses: Normal pulses.     Heart sounds: Normal heart sounds.  Pulmonary:     Effort: Pulmonary effort is normal.     Breath sounds: Normal breath sounds.  Skin:    General: Skin is warm.  Neurological:     General: No focal deficit present.     Mental Status: She is alert and oriented to person, place, and time. Mental status is at baseline.     Motor: No weakness.  Psychiatric:        Mood and Affect: Mood normal.        Behavior: Behavior normal.        Thought Content: Thought content normal.        Judgment: Judgment normal.     BP 136/86 (BP Location: Left Arm, Patient Position: Sitting)   Pulse 84   Ht 5\' 3"  (1.6 m)   Wt 195 lb (88.5 kg)   LMP 03/11/2013 (Within Months)   SpO2 95%   BMI 34.54 kg/m  Wt Readings from Last 3 Encounters:  08/04/19 195 lb (88.5 kg)  07/20/19 195 lb 14.4 oz (88.9 kg)  06/17/19 175 lb 8 oz (79.6 kg)   She was  encouraged to continue on her weight loss regimen.  Health Maintenance Due  Topic Date Due  . COVID-19 Vaccine (1) Never done  . TETANUS/TDAP  Never done  . URINE MICROALBUMIN  09/30/2019    There are no preventive care reminders to display for this patient.  Lab Results  Component  Value Date   TSH 1.440 04/28/2019   Lab Results  Component Value Date   WBC 6.9 06/14/2019   HGB 12.9 06/14/2019   HCT 39.2 06/14/2019   MCV 80.5 06/14/2019   PLT 338 06/14/2019   Lab Results  Component Value Date   NA 139 06/14/2019   K 3.8 06/14/2019   CO2 26 06/14/2019   GLUCOSE 141 (H) 06/14/2019   BUN 12 06/14/2019   CREATININE 0.61 06/14/2019   BILITOT 0.6 06/14/2019   ALKPHOS 113 06/14/2019   AST 18 06/14/2019   ALT 23 06/14/2019   PROT 8.0 06/14/2019   ALBUMIN 3.8 06/14/2019   CALCIUM 9.2 06/14/2019   ANIONGAP 7 06/14/2019   Lab Results  Component Value Date   CHOL 177 06/15/2019   Lab Results  Component Value Date   HDL 35 (L) 06/15/2019   Lab Results  Component Value Date   LDLCALC 117 (H) 06/15/2019   Lab Results  Component Value Date   TRIG 127 06/15/2019   Lab Results  Component Value Date   CHOLHDL 5.1 06/15/2019   Lab Results  Component Value Date   HGBA1C 8.1 (H) 07/28/2019      Assessment & Plan:    1. Anxiety - She will follow up with Ms. Jene Every for mental health evaluation.  2. Essential hypertension - Her blood pressure is under control, she will continue on current treatment regimen. - She was advised to continue on DASH diet and exercise as tolerated.  3. Diabetes mellitus without complication ([place]) - She will continue on current treatment regimen, her HgbA1c was 8.1% and her goal should be less than 7%. She was advised to continue on low carb/non concentrated sweet diet. - HgB A1c; Future  4. Hypercholesterolemia - She will continue on current treatment regimen and continue on low fat/cholesterol diet and exercise as tolerated. -  Lipid panel; Future  5. Tooth loose - She will follow up with Dentist on 08/09/2019.    Follow-up: Return in about 3 months (around 11/02/2019), or if symptoms worsen or fail to improve.    Masashi Snowdon Jerold Coombe, NP

## 2019-08-04 NOTE — Patient Instructions (Signed)
DASH Eating Plan DASH stands for "Dietary Approaches to Stop Hypertension." The DASH eating plan is a healthy eating plan that has been shown to reduce high blood pressure (hypertension). It may also reduce your risk for type 2 diabetes, heart disease, and stroke. The DASH eating plan may also help with weight loss. What are tips for following this plan?  General guidelines  Avoid eating more than 2,300 mg (milligrams) of salt (sodium) a day. If you have hypertension, you may need to reduce your sodium intake to 1,500 mg a day.  Limit alcohol intake to no more than 1 drink a day for nonpregnant women and 2 drinks a day for men. One drink equals 12 oz of beer, 5 oz of wine, or 1 oz of hard liquor.  Work with your health care provider to maintain a healthy body weight or to lose weight. Ask what an ideal weight is for you.  Get at least 30 minutes of exercise that causes your heart to beat faster (aerobic exercise) most days of the week. Activities may include walking, swimming, or biking.  Work with your health care provider or diet and nutrition specialist (dietitian) to adjust your eating plan to your individual calorie needs. Reading food labels   Check food labels for the amount of sodium per serving. Choose foods with less than 5 percent of the Daily Value of sodium. Generally, foods with less than 300 mg of sodium per serving fit into this eating plan.  To find whole grains, look for the word "whole" as the first word in the ingredient list. Shopping  Buy products labeled as "low-sodium" or "no salt added."  Buy fresh foods. Avoid canned foods and premade or frozen meals. Cooking  Avoid adding salt when cooking. Use salt-free seasonings or herbs instead of table salt or sea salt. Check with your health care provider or pharmacist before using salt substitutes.  Do not fry foods. Cook foods using healthy methods such as baking, boiling, grilling, and broiling instead.  Cook with  heart-healthy oils, such as olive, canola, soybean, or sunflower oil. Meal planning  Eat a balanced diet that includes: ? 5 or more servings of fruits and vegetables each day. At each meal, try to fill half of your plate with fruits and vegetables. ? Up to 6-8 servings of whole grains each day. ? Less than 6 oz of lean meat, poultry, or fish each day. A 3-oz serving of meat is about the same size as a deck of cards. One egg equals 1 oz. ? 2 servings of low-fat dairy each day. ? A serving of nuts, seeds, or beans 5 times each week. ? Heart-healthy fats. Healthy fats called Omega-3 fatty acids are found in foods such as flaxseeds and coldwater fish, like sardines, salmon, and mackerel.  Limit how much you eat of the following: ? Canned or prepackaged foods. ? Food that is high in trans fat, such as fried foods. ? Food that is high in saturated fat, such as fatty meat. ? Sweets, desserts, sugary drinks, and other foods with added sugar. ? Full-fat dairy products.  Do not salt foods before eating.  Try to eat at least 2 vegetarian meals each week.  Eat more home-cooked food and less restaurant, buffet, and fast food.  When eating at a restaurant, ask that your food be prepared with less salt or no salt, if possible. What foods are recommended? The items listed may not be a complete list. Talk with your dietitian about   what dietary choices are best for you. Grains Whole-grain or whole-wheat bread. Whole-grain or whole-wheat pasta. Brown rice. Oatmeal. Quinoa. Bulgur. Whole-grain and low-sodium cereals. Pita bread. Low-fat, low-sodium crackers. Whole-wheat flour tortillas. Vegetables Fresh or frozen vegetables (raw, steamed, roasted, or grilled). Low-sodium or reduced-sodium tomato and vegetable juice. Low-sodium or reduced-sodium tomato sauce and tomato paste. Low-sodium or reduced-sodium canned vegetables. Fruits All fresh, dried, or frozen fruit. Canned fruit in natural juice (without  added sugar). Meat and other protein foods Skinless chicken or turkey. Ground chicken or turkey. Pork with fat trimmed off. Fish and seafood. Egg whites. Dried beans, peas, or lentils. Unsalted nuts, nut butters, and seeds. Unsalted canned beans. Lean cuts of beef with fat trimmed off. Low-sodium, lean deli meat. Dairy Low-fat (1%) or fat-free (skim) milk. Fat-free, low-fat, or reduced-fat cheeses. Nonfat, low-sodium ricotta or cottage cheese. Low-fat or nonfat yogurt. Low-fat, low-sodium cheese. Fats and oils Soft margarine without trans fats. Vegetable oil. Low-fat, reduced-fat, or light mayonnaise and salad dressings (reduced-sodium). Canola, safflower, olive, soybean, and sunflower oils. Avocado. Seasoning and other foods Herbs. Spices. Seasoning mixes without salt. Unsalted popcorn and pretzels. Fat-free sweets. What foods are not recommended? The items listed may not be a complete list. Talk with your dietitian about what dietary choices are best for you. Grains Baked goods made with fat, such as croissants, muffins, or some breads. Dry pasta or rice meal packs. Vegetables Creamed or fried vegetables. Vegetables in a cheese sauce. Regular canned vegetables (not low-sodium or reduced-sodium). Regular canned tomato sauce and paste (not low-sodium or reduced-sodium). Regular tomato and vegetable juice (not low-sodium or reduced-sodium). Pickles. Olives. Fruits Canned fruit in a light or heavy syrup. Fried fruit. Fruit in cream or butter sauce. Meat and other protein foods Fatty cuts of meat. Ribs. Fried meat. Bacon. Sausage. Bologna and other processed lunch meats. Salami. Fatback. Hotdogs. Bratwurst. Salted nuts and seeds. Canned beans with added salt. Canned or smoked fish. Whole eggs or egg yolks. Chicken or turkey with skin. Dairy Whole or 2% milk, cream, and half-and-half. Whole or full-fat cream cheese. Whole-fat or sweetened yogurt. Full-fat cheese. Nondairy creamers. Whipped toppings.  Processed cheese and cheese spreads. Fats and oils Butter. Stick margarine. Lard. Shortening. Ghee. Bacon fat. Tropical oils, such as coconut, palm kernel, or palm oil. Seasoning and other foods Salted popcorn and pretzels. Onion salt, garlic salt, seasoned salt, table salt, and sea salt. Worcestershire sauce. Tartar sauce. Barbecue sauce. Teriyaki sauce. Soy sauce, including reduced-sodium. Steak sauce. Canned and packaged gravies. Fish sauce. Oyster sauce. Cocktail sauce. Horseradish that you find on the shelf. Ketchup. Mustard. Meat flavorings and tenderizers. Bouillon cubes. Hot sauce and Tabasco sauce. Premade or packaged marinades. Premade or packaged taco seasonings. Relishes. Regular salad dressings. Where to find more information:  National Heart, Lung, and Blood Institute: www.nhlbi.nih.gov  American Heart Association: www.heart.org Summary  The DASH eating plan is a healthy eating plan that has been shown to reduce high blood pressure (hypertension). It may also reduce your risk for type 2 diabetes, heart disease, and stroke.  With the DASH eating plan, you should limit salt (sodium) intake to 2,300 mg a day. If you have hypertension, you may need to reduce your sodium intake to 1,500 mg a day.  When on the DASH eating plan, aim to eat more fresh fruits and vegetables, whole grains, lean proteins, low-fat dairy, and heart-healthy fats.  Work with your health care provider or diet and nutrition specialist (dietitian) to adjust your eating plan to your   individual calorie needs. This information is not intended to replace advice given to you by your health care provider. Make sure you discuss any questions you have with your health care provider. Document Revised: 12/20/2016 Document Reviewed: 01/01/2016 Elsevier Patient Education  2020 [employer] Inc.  

## 2019-08-04 NOTE — Telephone Encounter (Signed)
08/04/2019 12:29:46 PM - Lantus Solostar renewal & dose ^ to [facility] - Wednesday, August 04, 2019 12:27 PM --Faxed [employer] renewal & dose increase for Lantus Solostar Inject max 42 units daily #3.

## 2019-08-12 ENCOUNTER — Institutional Professional Consult (permissible substitution): Payer: Medicaid Other | Admitting: Licensed Clinical Social Worker

## 2019-08-19 ENCOUNTER — Institutional Professional Consult (permissible substitution): Payer: Medicaid Other | Admitting: Licensed Clinical Social Worker

## 2019-08-23 ENCOUNTER — Ambulatory Visit: Payer: Self-pay | Admitting: Licensed Clinical Social Worker

## 2019-08-23 DIAGNOSIS — F419 Anxiety disorder, unspecified: Secondary | ICD-10-CM

## 2019-08-24 DIAGNOSIS — Z23 Encounter for immunization: Secondary | ICD-10-CM | POA: Diagnosis not present

## 2019-08-27 DIAGNOSIS — R519 Headache, unspecified: Secondary | ICD-10-CM | POA: Diagnosis not present

## 2019-08-27 DIAGNOSIS — G8929 Other chronic pain: Secondary | ICD-10-CM | POA: Diagnosis not present

## 2019-08-31 ENCOUNTER — Encounter: Payer: Self-pay | Admitting: Gerontology

## 2019-08-31 ENCOUNTER — Other Ambulatory Visit: Payer: Self-pay

## 2019-08-31 ENCOUNTER — Ambulatory Visit: Payer: Medicaid Other | Admitting: Licensed Clinical Social Worker

## 2019-08-31 ENCOUNTER — Ambulatory Visit: Payer: Medicaid Other | Admitting: Gerontology

## 2019-08-31 VITALS — BP 126/83 | HR 98 | Wt 195.8 lb

## 2019-08-31 DIAGNOSIS — F341 Dysthymic disorder: Secondary | ICD-10-CM

## 2019-08-31 DIAGNOSIS — E119 Type 2 diabetes mellitus without complications: Secondary | ICD-10-CM

## 2019-08-31 DIAGNOSIS — F4321 Adjustment disorder with depressed mood: Secondary | ICD-10-CM

## 2019-08-31 DIAGNOSIS — F411 Generalized anxiety disorder: Secondary | ICD-10-CM

## 2019-08-31 DIAGNOSIS — I10 Essential (primary) hypertension: Secondary | ICD-10-CM

## 2019-08-31 NOTE — [facility] Specialist Note (Signed)
Clinician opened note in error.

## 2019-08-31 NOTE — Patient Instructions (Signed)
DASH Eating Plan DASH stands for "Dietary Approaches to Stop Hypertension." The DASH eating plan is a healthy eating plan that has been shown to reduce high blood pressure (hypertension). It may also reduce your risk for type 2 diabetes, heart disease, and stroke. The DASH eating plan may also help with weight loss. What are tips for following this plan?  General guidelines  Avoid eating more than 2,300 mg (milligrams) of salt (sodium) a day. If you have hypertension, you may need to reduce your sodium intake to 1,500 mg a day.  Limit alcohol intake to no more than 1 drink a day for nonpregnant women and 2 drinks a day for men. One drink equals 12 oz of beer, 5 oz of wine, or 1 oz of hard liquor.  Work with your health care provider to maintain a healthy body weight or to lose weight. Ask what an ideal weight is for you.  Get at least 30 minutes of exercise that causes your heart to beat faster (aerobic exercise) most days of the week. Activities may include walking, swimming, or biking.  Work with your health care provider or diet and nutrition specialist (dietitian) to adjust your eating plan to your individual calorie needs. Reading food labels   Check food labels for the amount of sodium per serving. Choose foods with less than 5 percent of the Daily Value of sodium. Generally, foods with less than 300 mg of sodium per serving fit into this eating plan.  To find whole grains, look for the word "whole" as the first word in the ingredient list. Shopping  Buy products labeled as "low-sodium" or "no salt added."  Buy fresh foods. Avoid canned foods and premade or frozen meals. Cooking  Avoid adding salt when cooking. Use salt-free seasonings or herbs instead of table salt or sea salt. Check with your health care provider or pharmacist before using salt substitutes.  Do not fry foods. Cook foods using healthy methods such as baking, boiling, grilling, and broiling instead.  Cook with  heart-healthy oils, such as olive, canola, soybean, or sunflower oil. Meal planning  Eat a balanced diet that includes: ? 5 or more servings of fruits and vegetables each day. At each meal, try to fill half of your plate with fruits and vegetables. ? Up to 6-8 servings of whole grains each day. ? Less than 6 oz of lean meat, poultry, or fish each day. A 3-oz serving of meat is about the same size as a deck of cards. One egg equals 1 oz. ? 2 servings of low-fat dairy each day. ? A serving of nuts, seeds, or beans 5 times each week. ? Heart-healthy fats. Healthy fats called Omega-3 fatty acids are found in foods such as flaxseeds and coldwater fish, like sardines, salmon, and mackerel.  Limit how much you eat of the following: ? Canned or prepackaged foods. ? Food that is high in trans fat, such as fried foods. ? Food that is high in saturated fat, such as fatty meat. ? Sweets, desserts, sugary drinks, and other foods with added sugar. ? Full-fat dairy products.  Do not salt foods before eating.  Try to eat at least 2 vegetarian meals each week.  Eat more home-cooked food and less restaurant, buffet, and fast food.  When eating at a restaurant, ask that your food be prepared with less salt or no salt, if possible. What foods are recommended? The items listed may not be a complete list. Talk with your dietitian about   what dietary choices are best for you. Grains Whole-grain or whole-wheat bread. Whole-grain or whole-wheat pasta. Brown rice. Oatmeal. Quinoa. Bulgur. Whole-grain and low-sodium cereals. Pita bread. Low-fat, low-sodium crackers. Whole-wheat flour tortillas. Vegetables Fresh or frozen vegetables (raw, steamed, roasted, or grilled). Low-sodium or reduced-sodium tomato and vegetable juice. Low-sodium or reduced-sodium tomato sauce and tomato paste. Low-sodium or reduced-sodium canned vegetables. Fruits All fresh, dried, or frozen fruit. Canned fruit in natural juice (without  added sugar). Meat and other protein foods Skinless chicken or turkey. Ground chicken or turkey. Pork with fat trimmed off. Fish and seafood. Egg whites. Dried beans, peas, or lentils. Unsalted nuts, nut butters, and seeds. Unsalted canned beans. Lean cuts of beef with fat trimmed off. Low-sodium, lean deli meat. Dairy Low-fat (1%) or fat-free (skim) milk. Fat-free, low-fat, or reduced-fat cheeses. Nonfat, low-sodium ricotta or cottage cheese. Low-fat or nonfat yogurt. Low-fat, low-sodium cheese. Fats and oils Soft margarine without trans fats. Vegetable oil. Low-fat, reduced-fat, or light mayonnaise and salad dressings (reduced-sodium). Canola, safflower, olive, soybean, and sunflower oils. Avocado. Seasoning and other foods Herbs. Spices. Seasoning mixes without salt. Unsalted popcorn and pretzels. Fat-free sweets. What foods are not recommended? The items listed may not be a complete list. Talk with your dietitian about what dietary choices are best for you. Grains Baked goods made with fat, such as croissants, muffins, or some breads. Dry pasta or rice meal packs. Vegetables Creamed or fried vegetables. Vegetables in a cheese sauce. Regular canned vegetables (not low-sodium or reduced-sodium). Regular canned tomato sauce and paste (not low-sodium or reduced-sodium). Regular tomato and vegetable juice (not low-sodium or reduced-sodium). Pickles. Olives. Fruits Canned fruit in a light or heavy syrup. Fried fruit. Fruit in cream or butter sauce. Meat and other protein foods Fatty cuts of meat. Ribs. Fried meat. Bacon. Sausage. Bologna and other processed lunch meats. Salami. Fatback. Hotdogs. Bratwurst. Salted nuts and seeds. Canned beans with added salt. Canned or smoked fish. Whole eggs or egg yolks. Chicken or turkey with skin. Dairy Whole or 2% milk, cream, and half-and-half. Whole or full-fat cream cheese. Whole-fat or sweetened yogurt. Full-fat cheese. Nondairy creamers. Whipped toppings.  Processed cheese and cheese spreads. Fats and oils Butter. Stick margarine. Lard. Shortening. Ghee. Bacon fat. Tropical oils, such as coconut, palm kernel, or palm oil. Seasoning and other foods Salted popcorn and pretzels. Onion salt, garlic salt, seasoned salt, table salt, and sea salt. Worcestershire sauce. Tartar sauce. Barbecue sauce. Teriyaki sauce. Soy sauce, including reduced-sodium. Steak sauce. Canned and packaged gravies. Fish sauce. Oyster sauce. Cocktail sauce. Horseradish that you find on the shelf. Ketchup. Mustard. Meat flavorings and tenderizers. Bouillon cubes. Hot sauce and Tabasco sauce. Premade or packaged marinades. Premade or packaged taco seasonings. Relishes. Regular salad dressings. Where to find more information:  National Heart, Lung, and Blood Institute: www.nhlbi.nih.gov  American Heart Association: www.heart.org Summary  The DASH eating plan is a healthy eating plan that has been shown to reduce high blood pressure (hypertension). It may also reduce your risk for type 2 diabetes, heart disease, and stroke.  With the DASH eating plan, you should limit salt (sodium) intake to 2,300 mg a day. If you have hypertension, you may need to reduce your sodium intake to 1,500 mg a day.  When on the DASH eating plan, aim to eat more fresh fruits and vegetables, whole grains, lean proteins, low-fat dairy, and heart-healthy fats.  Work with your health care provider or diet and nutrition specialist (dietitian) to adjust your eating plan to your   individual calorie needs. This information is not intended to replace advice given to you by your health care provider. Make sure you discuss any questions you have with your health care provider. Document Revised: 12/20/2016 Document Reviewed: 01/01/2016 Elsevier Patient Education  2020 [employer] Inc. Carbohydrate Counting for Diabetes Mellitus, Adult  Carbohydrate counting is a method of keeping track of how many carbohydrates you  eat. Eating carbohydrates naturally increases the amount of sugar (glucose) in the blood. Counting how many carbohydrates you eat helps keep your blood glucose within normal limits, which helps you manage your diabetes (diabetes mellitus). It is important to know how many carbohydrates you can safely have in each meal. This is different for every person. A diet and nutrition specialist (registered dietitian) can help you make a meal plan and calculate how many carbohydrates you should have at each meal and snack. Carbohydrates are found in the following foods:  Grains, such as breads and cereals.  Dried beans and soy products.  Starchy vegetables, such as potatoes, peas, and corn.  Fruit and fruit juices.  Milk and yogurt.  Sweets and snack foods, such as cake, cookies, candy, chips, and soft drinks. How do I count carbohydrates? There are two ways to count carbohydrates in food. You can use either of the methods or a combination of both. Reading "Nutrition Facts" on packaged food The "Nutrition Facts" list is included on the labels of almost all packaged foods and beverages in the [place] It includes:  The serving size.  Information about nutrients in each serving, including the grams (g) of carbohydrate per serving. To use the "Nutrition Facts":  Decide how many servings you will have.  Multiply the number of servings by the number of carbohydrates per serving.  The resulting number is the total amount of carbohydrates that you will be having. Learning standard serving sizes of other foods When you eat carbohydrate foods that are not packaged or do not include "Nutrition Facts" on the label, you need to measure the servings in order to count the amount of carbohydrates:  Measure the foods that you will eat with a food scale or measuring cup, if needed.  Decide how many standard-size servings you will eat.  Multiply the number of servings by 15. Most carbohydrate-rich foods have  about 15 g of carbohydrates per serving. ? For example, if you eat 8 oz (170 g) of strawberries, you will have eaten 2 servings and 30 g of carbohydrates (2 servings x 15 g = 30 g).  For foods that have more than one food mixed, such as soups and casseroles, you must count the carbohydrates in each food that is included. The following list contains standard serving sizes of common carbohydrate-rich foods. Each of these servings has about 15 g of carbohydrates:   hamburger bun or  English muffin.   oz (15 mL) syrup.   oz (14 g) jelly.  1 slice of bread.  1 six-inch tortilla.  3 oz (85 g) cooked rice or pasta.  4 oz (113 g) cooked dried beans.  4 oz (113 g) starchy vegetable, such as peas, corn, or potatoes.  4 oz (113 g) hot cereal.  4 oz (113 g) mashed potatoes or  of a large baked potato.  4 oz (113 g) canned or frozen fruit.  4 oz (120 mL) fruit juice.  4-6 crackers.  6 chicken nuggets.  6 oz (170 g) unsweetened dry cereal.  6 oz (170 g) plain fat-free yogurt or yogurt sweetened with   artificial sweeteners.  8 oz (240 mL) milk.  8 oz (170 g) fresh fruit or one small piece of fruit.  24 oz (680 g) popped popcorn. Example of carbohydrate counting Sample meal  3 oz (85 g) chicken breast.  6 oz (170 g) brown rice.  4 oz (113 g) corn.  8 oz (240 mL) milk.  8 oz (170 g) strawberries with sugar-free whipped topping. Carbohydrate calculation 1. Identify the foods that contain carbohydrates: ? Rice. ? Corn. ? Milk. ? Strawberries. 2. Calculate how many servings you have of each food: ? 2 servings rice. ? 1 serving corn. ? 1 serving milk. ? 1 serving strawberries. 3. Multiply each number of servings by 15 g: ? 2 servings rice x 15 g = 30 g. ? 1 serving corn x 15 g = 15 g. ? 1 serving milk x 15 g = 15 g. ? 1 serving strawberries x 15 g = 15 g. 4. Add together all of the amounts to find the total grams of carbohydrates eaten: ? 30 g + 15 g + 15 g + 15  g = 75 g of carbohydrates total. Summary  Carbohydrate counting is a method of keeping track of how many carbohydrates you eat.  Eating carbohydrates naturally increases the amount of sugar (glucose) in the blood.  Counting how many carbohydrates you eat helps keep your blood glucose within normal limits, which helps you manage your diabetes.  A diet and nutrition specialist (registered dietitian) can help you make a meal plan and calculate how many carbohydrates you should have at each meal and snack. This information is not intended to replace advice given to you by your health care provider. Make sure you discuss any questions you have with your health care provider. Document Revised: 08/01/2016 Document Reviewed: 06/21/2015 Elsevier Patient Education  2020 [employer].  

## 2019-08-31 NOTE — BH Specialist Note (Signed)
Integrated Behavioral Health Comprehensive Clinical Assessment Update in Office  MRN: [id number] Name: Angelica [name]  Type of Service: Integrated Behavioral Health-Individual Interpretor: No. Interpretor Name and Language: N/A  PRESENTING CONCERNS: Angelica [name] is a 59 y.o. female accompanied by herself. Angelica [name] was referred to [facility] clinician for mental health assessment.  Previous mental health services Have you ever been treated for a mental health problem? Yes If "Yes", when were you treated and whom did you see? Angelica Hy, LCSW 10/29/2019 for two to three sessions in October of 2020.  Have you ever been hospitalized for mental health treatment? No Have you ever been treated for any of the following? Past Psychiatric History/Hospitalization(s): Anxiety: Yes Yes Angelica [name] reports that she has been dealing with symptoms of depression for the last two years. She explains that she has not felt like herself in awhile. She explains that she has problems with starting things but is unable to finish what she starts. Her symptoms of anxiety include: feeling nervous, anxious, or on edge over half the days, not being able to stop or control her worrying, worrying too much about different things, difficulty relaxing, becoming easily annoyed or irritable, and feeling afraid as if something awful might happen.  Bipolar Disorder: No Depression: Yes Angelica [name] describes experiencing crying spells, mood swings, racing thoughts, excessive worrying, insomnia (difficulty falling asleep), fatigue, difficulty concentrating, loss of interest in previously enjoyed activities, and poor appetite. She denies suicidal and homicidal thoughts. There is no access to a fire arm in the home.  Mania: No Psychosis: No Schizophrenia: No Personality Disorder: No Hospitalization for psychiatric illness: No History of Electroconvulsive Shock Therapy: No   Prior Suicide Attempts:  No Have you ever had thoughts of harming yourself or others or attempted suicide? No plan to harm self or others  Medical history  has a past medical history of Bell palsy (08/04/2014), GERD (gastroesophageal reflux disease) (01/27/2015), History of Bell's palsy (June 2016), Hyperlipidemia (01/27/2015), Hypertension (01/27/2015), and Type 2 diabetes mellitus ([place]) (08/04/2014). Primary Care Physician: Angelica Millers, MD Date of last physical exam:  Allergies: No Known Allergies Current medications:  Outpatient Encounter Medications as of 08/31/2019  Medication Sig  . atorvastatin (LIPITOR) 40 MG tablet Take 1 tablet (40 mg total) by mouth daily at 6 PM.  . clopidogrel (PLAVIX) 75 MG tablet Take 1 tablet (75 mg total) by mouth daily.  Marland Kitchen docusate sodium (COLACE) 100 MG capsule Take 1 capsule (100 mg total) by mouth 2 (two) times daily.  . hydrochlorothiazide (HYDRODIURIL) 12.5 MG tablet Take 1 tablet (12.5 mg total) by mouth daily.  . insulin glargine (LANTUS SOLOSTAR) 100 UNIT/ML Solostar Pen Inject 38 Units into the skin daily at 10 pm. Increase by 4 units every 3 days if fasting blood glucose is > 180 mg/dl.  . metFORMIN (GLUCOPHAGE-XR) 500 MG 24 hr tablet Take 2 tablets (1,000 mg total) by mouth daily with breakfast AND 2 tablets (1,000 mg total) daily before supper.  . pantoprazole (PROTONIX) 40 MG tablet Take 1 tablet (40 mg total) by mouth daily.  . Semaglutide (OZEMPIC) 0.25 or 0.5 MG/DOSE SOPN Inject 0.5 mg into the skin once a week.   No facility-administered encounter medications on file as of 08/31/2019.   Have you ever had any serious medication reactions? No Is there any history of mental health problems or substance abuse in your family? No Has anyone in your family been hospitalized for mental health treatment? No  Social/family history  Who lives in your current household? Older sister  What is your family of origin, childhood history?  Where were you born? [name] was born in [place], [place].  Where did you grow up? [place], [place].  How many different homes have you lived in? Several.  Describe your childhood: Angelica [name] describes her childhood has been okay. She explains that although her parents worked all the time that they were loving. She denies any history of abuse or neglect. She notes that due to being the youngest sibling that a few of her older siblings would look out for her. Her mom had her at the age of 3.  Do you have siblings, step/half siblings? Yes- Angelica [name] is the oldest of 8 siblings. There of her siblings have passed away. What are their names, relation, sex, age? See above. Are your parents separated or divorced? No What are your social supports? Angelica [name] has the support of her niece, sister, boyfriend, a couple of friends, and a few of her fellow church members.   Education How many grades have you completed? 11th grade Did you have any problems in school? 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 She is unemployed because her 27 year old mom needed someone to care for her. She worked at a group home from 2011 until 2014 when her mom became ill and she stepped in to be her primary care giver.   Sleep Usual bedtime is around 7:30 and 8:30 or 9:00 pm.  Sleeping arrangements: by herself. Problems with snoring: No Obstructive sleep apnea is not a concern. Problems with nightmares: No Problems with night terrors: No Problems with sleepwalking: No  Trauma/Abuse history Have you ever experienced or been exposed to any form of abuse? No Have you ever experienced or been exposed to something traumatic? No  Substance use Do you use alcohol, nicotine or caffeine? No alcohol use How old were you when you first tasted alcohol?  Have you ever used illicit drugs or abused prescription medications? 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 She completed a rehab program at the [facility] in [facility] in 2009. She then went to an [facility] for two in half years.   Mental status General appearance/Behavior: Casual Eye contact: Good Motor behavior: Normal Speech: Normal Level of consciousness: Alert Mood: Anxious Affect: Appropriate Anxiety level: Minimal Thought process: Coherent Thought content: WNL Perception: Normal Judgment: Good Insight: Present  Diagnosis No diagnosis found.  GOALS ADDRESSED: Patient will reduce symptoms of: anxiety, depression, insomnia, stress and grief over loss of mom on February 9th of 2021 and lost uncle in the last month and increase knowledge and/or ability of: coping skills, healthy habits, self-management skills and stress reduction and also: Increase healthy adjustment to current life circumstances              INTERVENTIONS: Interventions utilized: Updated Biopsyhosocial assessment Standardized Assessments completed: GAD-7 and PHQ 9 GAD 7 13 PHQ 9 16  ASSESSMENT/OUTCOME:  Porchia Sinkler is a 59 year old African American female who presents today for an updated mental health assessment. Tzipporah has a history of both anxiety and depression. Her depression has worsened due to her mom passing away on February 9th of 2021 and suffering a stroke on May 24th. She has not previously been prescribed psychotropic medications in the past.  She has not previously been hospitalized for mental illness. Her doctor at [facility] who has been treating her for migraines would like to put her on Venlafaxine and wanted to have it okayed by the psychiatric consultant before doing so.   Shatika is an established patient at [facility]. She has a history of stroke, hypertension, GERD, polyp of ascending colon, diabetes, bell palsy, weakness of the right leg, hyperlipidemia, menopausal, glycosuria, and migraines. She has never had adverse reactions to medications. She has no  history of allergies.   Emmanuelle lives in a house she owns with her older sister. She is primary caregiver to her older sister. She is unemployed. She has a boyfriend for approximately 21 years. She has the support of her niece, sister, boyfriend, a couple of friends, and a few of her fellow church members. She is a Engineer, manufacturing. She denies a history of substance abuse and mental illness in her family. Her sister carries a diagnosis of dementia.   PLAN: Case consultation with Dr. Octavia Heir, MD, psychiatric consultant on Tuesday August 17th who recommended starting the patient on Nortriptyline 25 mg at bedtime to help with anxiety, depression, insomnia, and migraines.  Scheduled next visit:  One to two weeks or earlier if needed.  [place] Work

## 2019-08-31 NOTE — Progress Notes (Signed)
Established Patient Office Visit  Subjective:  Patient ID: Angelica [name], female    DOB: Nov 05, 1960  Age: 59 y.o. MRN: [id number]  CC:  Chief Complaint  Patient presents with  . Hypertension  . Diabetes    HPI Angelica [name] presents for  follow up of hypertension and Type 2 diabetes mellitus. She states that she's compliant with her medication and continues to make healthy lifestyle changes. Her HgbA1c done on 07/28/2019 was 8.1%,she reports that she checked her blood glucose 3 days ago and it was 135 mg/dl. Her blood glucose checked during visit was 159 mg/dl. She denies hypoglycemic/hyperglycemic symptoms and peripheral neuropathy. She reports that she experienced generalized body ache , diarrhea, fever, lost of taste after receiving the first Covid vaccine dose on 08/24/2019. Currently she states that her symptoms has resolved. Her HCTZ was decreased to 12.5 mg due to dizziness, currently, she states that her dizziness has resolved and was tapered off 40 mg Propranolol for Headache prophylaxis. She was seen at [facility] on 08/24/2019 by Dr A.Aundra Dubin for chronic intractable headache, had Ophthalmology exam done on 08/27/2019 and will haveTemporal Biopsy scheduled for 09/03/2019 at [facility]. She continues with out patient Physical Therapy on Tuesdays at [facility] for right lower extremity weakness. Overall, she states that she's doing well and offers no further complaint.  Past Medical History:  Diagnosis Date  . Bell palsy 08/04/2014  . GERD (gastroesophageal reflux disease) 01/27/2015  . History of Bell's palsy June 2016  . Hyperlipidemia 01/27/2015  . Hypertension 01/27/2015  . Type 2 diabetes mellitus ([place]) 08/04/2014    Past Surgical History:  Procedure Laterality Date  . ABDOMINAL HYSTERECTOMY    . COLONOSCOPY WITH PROPOFOL N/A 10/07/2018   Procedure: COLONOSCOPY WITH PROPOFOL;  Surgeon: Virgel Manifold, MD;  Location: [facility] ENDOSCOPY;  Service: Gastroenterology;   Laterality: N/A;  . OOPHORECTOMY Right 2005    Family History  Problem Relation Age of Onset  . Cancer Brother        Colon Cancer  . Breast cancer Neg Hx     Social History   Socioeconomic History  . Marital status: Single    Spouse name: Not on file  . Number of children: 2  . Years of education: Not on file  . Highest education level: 11th grade  Occupational History  . Not on file  Tobacco Use  . Smoking status: Never Smoker  . Smokeless tobacco: Never Used  Vaping Use  . Vaping Use: Never used  Substance and Sexual Activity  . Alcohol use: No  . Drug use: No  . Sexual activity: Yes    Birth control/protection: Post-menopausal  Other Topics Concern  . 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 Not employed      On Food stamps   Social Determinants of Health   Financial Resource Strain:   . Difficulty of Paying Living Expenses:   Food Insecurity:   . Worried About Charity fundraiser in the Last Year:   . Arboriculturist in the Last Year:   Transportation Needs:   . Film/video editor (Medical):   Marland Kitchen Lack of Transportation (Non-Medical):   Physical Activity:   . Days of Exercise per Week:   . Minutes of Exercise per Session:   Stress:   . Feeling of Stress :   Social Connections:   . Frequency of Communication  with Friends and Family:   . Frequency of Social Gatherings with Friends and Family:   . Attends Religious Services:   . Active Member of Clubs or Organizations:   . Attends Archivist Meetings:   Marland Kitchen Marital Status:   Intimate Partner Violence:   . Fear of Current or Ex-Partner:   . Emotionally Abused:   Marland Kitchen Physically Abused:   . Sexually Abused:     Outpatient Medications Prior to Visit  Medication Sig Dispense Refill  . atorvastatin (LIPITOR) 40 MG tablet Take 1 tablet (40 mg total) by mouth daily at 6 PM. 90 tablet 0  . clopidogrel (PLAVIX) 75 MG tablet Take 1  tablet (75 mg total) by mouth daily. 90 tablet 0  . hydrochlorothiazide (HYDRODIURIL) 12.5 MG tablet Take 1 tablet (12.5 mg total) by mouth daily. 90 tablet 0  . insulin glargine (LANTUS SOLOSTAR) 100 UNIT/ML Solostar Pen Inject 38 Units into the skin daily at 10 pm. Increase by 4 units every 3 days if fasting blood glucose is > 180 mg/dl. 30 mL 3  . metFORMIN (GLUCOPHAGE-XR) 500 MG 24 hr tablet Take 2 tablets (1,000 mg total) by mouth daily with breakfast AND 2 tablets (1,000 mg total) daily before supper. 270 tablet 3  . pantoprazole (PROTONIX) 40 MG tablet Take 1 tablet (40 mg total) by mouth daily. 40 tablet 2  . Semaglutide (OZEMPIC) 0.25 or 0.5 MG/DOSE SOPN Inject 0.5 mg into the skin once a week.    . docusate sodium (COLACE) 100 MG capsule Take 1 capsule (100 mg total) by mouth 2 (two) times daily. 30 capsule 0   No facility-administered medications prior to visit.    No Known Allergies  ROS Review of Systems  Constitutional: Negative.   Eyes: Negative.   Respiratory: Negative.   Cardiovascular: Negative.   Endocrine: Negative.   Neurological: Positive for weakness (right lower extremity). Negative for dizziness.  Psychiatric/Behavioral: Negative.       Objective:    Physical Exam HENT:     Head: Normocephalic and atraumatic.  Cardiovascular:     Rate and Rhythm: Normal rate and regular rhythm.     Pulses: Normal pulses.     Heart sounds: Normal heart sounds.  Pulmonary:     Effort: Pulmonary effort is normal.     Breath sounds: Normal breath sounds.  Skin:    General: Skin is warm.  Neurological:     General: No focal deficit present.     Mental Status: She is alert and oriented to person, place, and time. Mental status is at baseline.  Psychiatric:        Mood and Affect: Mood normal.        Behavior: Behavior normal.        Thought Content: Thought content normal.        Judgment: Judgment normal.     BP 126/83 (BP Location: Left Arm, Patient Position:  Sitting)   Pulse 98   Wt 195 lb 12.8 oz (88.8 kg)   LMP 03/11/2013 (Within Months)   SpO2 99%   BMI 34.68 kg/m  Wt Readings from Last 3 Encounters:  08/31/19 195 lb 12.8 oz (88.8 kg)  08/04/19 195 lb (88.5 kg)  07/20/19 195 lb 14.4 oz (88.9 kg)   She was encouraged to continue on her weight loss regimen.  Health Maintenance Due  Topic Date Due  . TETANUS/TDAP  Never done  . INFLUENZA VACCINE  08/22/2019  . URINE MICROALBUMIN  09/30/2019  There are no preventive care reminders to display for this patient.  Lab Results  Component Value Date   TSH 1.440 04/28/2019   Lab Results  Component Value Date   WBC 6.9 06/14/2019   HGB 12.9 06/14/2019   HCT 39.2 06/14/2019   MCV 80.5 06/14/2019   PLT 338 06/14/2019   Lab Results  Component Value Date   NA 139 06/14/2019   K 3.8 06/14/2019   CO2 26 06/14/2019   GLUCOSE 141 (H) 06/14/2019   BUN 12 06/14/2019   CREATININE 0.61 06/14/2019   BILITOT 0.6 06/14/2019   ALKPHOS 113 06/14/2019   AST 18 06/14/2019   ALT 23 06/14/2019   PROT 8.0 06/14/2019   ALBUMIN 3.8 06/14/2019   CALCIUM 9.2 06/14/2019   ANIONGAP 7 06/14/2019   Lab Results  Component Value Date   CHOL 177 06/15/2019   Lab Results  Component Value Date   HDL 35 (L) 06/15/2019   Lab Results  Component Value Date   LDLCALC 117 (H) 06/15/2019   Lab Results  Component Value Date   TRIG 127 06/15/2019   Lab Results  Component Value Date   CHOLHDL 5.1 06/15/2019   Lab Results  Component Value Date   HGBA1C 8.1 (H) 07/28/2019      Assessment & Plan:    1. Essential hypertension - Her blood pressure is under control, she will continue on current treatment regimen, and was advised to continue on DASH diet and exercise as tolerated. Check blood pressure twice weekly, record and bring log to follow up appointment.  2. Diabetes mellitus without complication ([place]) - Her HgbA1c was 8.1%, her goal should be less than 7%. She will continue on current  treatment regimen, advised to check fasting blood glucose daily, record and bring log to follow up appointment. Her fasting blood glucose should be between 80-130 mg/dl. She was advised to continue on low carb/non concentrated sweet diet. - Urine Microalbumin w/creat. ratio; Future    Follow-up: Return in about 2 months (around 11/04/2019), or if symptoms worsen or fail to improve.    Taziah Difatta Jerold Coombe, NP

## 2019-09-03 DIAGNOSIS — R700 Elevated erythrocyte sedimentation rate: Secondary | ICD-10-CM | POA: Diagnosis not present

## 2019-09-03 DIAGNOSIS — R519 Headache, unspecified: Secondary | ICD-10-CM | POA: Diagnosis not present

## 2019-09-06 DIAGNOSIS — M316 Other giant cell arteritis: Secondary | ICD-10-CM | POA: Diagnosis not present

## 2019-09-07 ENCOUNTER — Other Ambulatory Visit: Payer: Self-pay | Admitting: Gerontology

## 2019-09-07 ENCOUNTER — Ambulatory Visit: Payer: Medicaid Other

## 2019-09-07 DIAGNOSIS — R899 Unspecified abnormal finding in specimens from other organs, systems and tissues: Secondary | ICD-10-CM

## 2019-09-07 DIAGNOSIS — F341 Dysthymic disorder: Secondary | ICD-10-CM

## 2019-09-07 MED ORDER — NORTRIPTYLINE HCL 25 MG PO CAPS: 25.0000 mg | ORAL_CAPSULE | Freq: Every day | ORAL | 0 refills | Status: DC

## 2019-09-10 ENCOUNTER — Telehealth: Payer: Self-pay

## 2019-09-10 NOTE — Telephone Encounter (Signed)
Patient had called to reschedule her colonoscopy with the front desk. However, when I called her back, she did not pick up my call. I left her a voicemail to call me back. I will call her back on Monday if she doesn't return my call today.

## 2019-09-15 ENCOUNTER — Other Ambulatory Visit: Payer: Self-pay

## 2019-09-15 ENCOUNTER — Other Ambulatory Visit: Payer: Medicaid Other

## 2019-09-15 DIAGNOSIS — Z1211 Encounter for screening for malignant neoplasm of colon: Secondary | ICD-10-CM

## 2019-09-15 DIAGNOSIS — E119 Type 2 diabetes mellitus without complications: Secondary | ICD-10-CM

## 2019-09-15 DIAGNOSIS — R899 Unspecified abnormal finding in specimens from other organs, systems and tissues: Secondary | ICD-10-CM

## 2019-09-15 DIAGNOSIS — E7800 Pure hypercholesterolemia, unspecified: Secondary | ICD-10-CM

## 2019-09-15 NOTE — Telephone Encounter (Signed)
Called patient again and was able to reschedule her colonoscopy to 10/13/2019 at [facility]. Patient stated that she was a selfpay and that Dr. Bonna Gains had mentioned that we were able to give her a free prep. I told her that we only had Clenpiq. I explained how she needed to take it and that it would be left at the front desk for pick up. Patient also wanted me to leave her instructions at the front desk with her Clenpiq. I told her that I would. I explained her instructions and how to take the Clenpiq. Patient understood and had no further questions.

## 2019-09-16 LAB — URINALYSIS
Bilirubin, UA: NEGATIVE
Glucose, UA: NEGATIVE
Ketones, UA: NEGATIVE
Leukocytes,UA: NEGATIVE
Nitrite, UA: NEGATIVE
Protein,UA: NEGATIVE
RBC, UA: NEGATIVE
Specific Gravity, UA: 1.018 (ref 1.005–1.030)
Urobilinogen, Ur: 0.2 mg/dL (ref 0.2–1.0)
pH, UA: 5 (ref 5.0–7.5)

## 2019-09-16 LAB — HEMOGLOBIN A1C
Est. average glucose Bld gHb Est-mCnc: 171 mg/dL
Hgb A1c MFr Bld: 7.6 % — ABNORMAL HIGH (ref 4.8–5.6)

## 2019-09-16 LAB — MICROALBUMIN / CREATININE URINE RATIO
Creatinine, Urine: 68 mg/dL
Microalb/Creat Ratio: 6 mg/g creat (ref 0–29)
Microalbumin, Urine: 3.9 ug/mL

## 2019-09-16 LAB — LIPID PANEL
Chol/HDL Ratio: 3.4 ratio (ref 0.0–4.4)
Cholesterol, Total: 142 mg/dL (ref 100–199)
HDL: 42 mg/dL (ref >39)
LDL Chol Calc (NIH): 85 mg/dL (ref 0–99)
Triglycerides: 75 mg/dL (ref 0–149)
VLDL Cholesterol Cal: 15 mg/dL (ref 5–40)

## 2019-09-16 LAB — SEDIMENTATION RATE: Sed Rate: 60 mm/hr — ABNORMAL HIGH (ref 0–40)

## 2019-09-16 LAB — C-REACTIVE PROTEIN: CRP: 22 mg/L — ABNORMAL HIGH (ref 0–10)

## 2019-09-16 LAB — GAMMA GT: GGT: 31 IU/L (ref 0–60)

## 2019-09-21 ENCOUNTER — Ambulatory Visit: Payer: Self-pay | Admitting: Licensed Clinical Social Worker

## 2019-09-21 ENCOUNTER — Other Ambulatory Visit: Payer: Self-pay

## 2019-09-21 ENCOUNTER — Ambulatory Visit: Payer: Medicaid Other | Admitting: Licensed Clinical Social Worker

## 2019-09-21 DIAGNOSIS — F341 Dysthymic disorder: Secondary | ICD-10-CM

## 2019-09-21 DIAGNOSIS — F411 Generalized anxiety disorder: Secondary | ICD-10-CM

## 2019-09-22 DIAGNOSIS — Z23 Encounter for immunization: Secondary | ICD-10-CM | POA: Diagnosis not present

## 2019-09-22 NOTE — BH Specialist Note (Signed)
Integrated Behavioral Health Follow Up Visit  MRN: [id number] Name: Angelica [name]   Type of Service: [place] Interpretor:No. Interpretor Name and Language: None  SUBJECTIVE: Angelica [name] is a 59 y.o. female accompanied by self Patient was referred by Angelica [name] for mental health. Patient reports the following symptoms/concerns: The Patient reports she is under a lot of stress caring for her sister. She states she has to ration her sister's cigarettes to her because she will smoke them all. She also has to regulate her sister's meals because she will overeat to the point of having a negative impact on her health. Angelica [name] says she feels guilty about regulating food even though she knows it is in sister's best interest, and when family questions or comments about this it compounds these feelings. The patient reports she is still experiencing sleep issues and the Nortriptyline 25 mg is not helping yet. She states she can not sleep in her bed and usually gets up and goes to the couch, eventually falling asleep when the sun comes up. The patient is having 4-5 dreams a week that are disturbing to her. She describes them as being about people from the past drug use time in her life, running off a cliff, and about various family memebers. She further reports feeling run down and tired. The patient stated her boyfriend and her are planning to see a comedy show and hopes this will help. The patient denies any SI or HI and denies any intent to harm herself or others.   Duration of problem:; Severity of problem: moderate  OBJECTIVE: Mood: NA and Affect: Appropriate Risk of harm to self or others: No plan to harm self or others Patient denies SI or HI thoughts  LIFE CONTEXT: Family and Social: see above School/Work: see above Self-Care: see above Life Changes: see above  GOALS ADDRESSED: Patient will: 1.  Reduce symptoms of: agitation, anxiety and  depression  2.  Increase knowledge and/or ability of: coping skills and healthy habits  3.  Demonstrate ability to: Increase healthy adjustment to current life circumstances  INTERVENTIONS: Interventions utilized:  Supportive Counseling was utilized by the clinician during todays session.The clinician processed with the patient how she was doing since the last session. The clinician discussed with the patient the Nortriptyline 25 mg that was previously prescribed during the last session and the effects she noticed. 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 The clinician used reflective listening to offer a safe space for the patient to vent her frustrations as a caregiver. The clinician measured the patients symptoms on a numerical scale.  Standardized Assessments completed: GAD-7 and PHQ 9  Gad-7 18  PHQ-9 15  ASSESSMENT: Patient currently experiencing see above  Patient may benefit from see above  PLAN: 1. Follow up with behavioral health clinician on : 10/05/2019 at 12:00 pm  2. Behavioral recommendations: 3. Referral(s): [place] (In Clinic) "From scale of 1-10, how likely are you to follow plan?":  Angelica [name], Student-Social Work

## 2019-09-23 ENCOUNTER — Ambulatory Visit: Payer: Medicaid Other | Admitting: Gerontology

## 2019-09-28 ENCOUNTER — Other Ambulatory Visit: Payer: Self-pay

## 2019-09-28 ENCOUNTER — Encounter: Payer: Self-pay | Admitting: Gerontology

## 2019-09-28 ENCOUNTER — Ambulatory Visit: Payer: Medicaid Other | Admitting: Gerontology

## 2019-09-28 VITALS — BP 121/83 | HR 86 | Temp 97.9°F | Resp 18 | Ht 63.0 in | Wt 195.2 lb

## 2019-09-28 DIAGNOSIS — R29898 Other symptoms and signs involving the musculoskeletal system: Secondary | ICD-10-CM

## 2019-09-28 DIAGNOSIS — R899 Unspecified abnormal finding in specimens from other organs, systems and tissues: Secondary | ICD-10-CM | POA: Insufficient documentation

## 2019-09-28 DIAGNOSIS — I10 Essential (primary) hypertension: Secondary | ICD-10-CM

## 2019-09-28 DIAGNOSIS — E119 Type 2 diabetes mellitus without complications: Secondary | ICD-10-CM

## 2019-09-28 DIAGNOSIS — I6381 Other cerebral infarction due to occlusion or stenosis of small artery: Secondary | ICD-10-CM

## 2019-09-28 MED ORDER — HYDROCHLOROTHIAZIDE 12.5 MG PO TABS: 12.5000 mg | ORAL_TABLET | Freq: Every day | ORAL | 0 refills | Status: DC

## 2019-09-28 NOTE — Patient Instructions (Signed)
Carbohydrate Counting for Diabetes Mellitus, Adult  Carbohydrate counting is a method of keeping track of how many carbohydrates you eat. Eating carbohydrates naturally increases the amount of sugar (glucose) in the blood. Counting how many carbohydrates you eat helps keep your blood glucose within normal limits, which helps you manage your diabetes (diabetes mellitus). It is important to know how many carbohydrates you can safely have in each meal. This is different for every person. A diet and nutrition specialist (registered dietitian) can help you make a meal plan and calculate how many carbohydrates you should have at each meal and snack. Carbohydrates are found in the following foods:  Grains, such as breads and cereals.  Dried beans and soy products.  Starchy vegetables, such as potatoes, peas, and corn.  Fruit and fruit juices.  Milk and yogurt.  Sweets and snack foods, such as cake, cookies, candy, chips, and soft drinks. How do I count carbohydrates? There are two ways to count carbohydrates in food. You can use either of the methods or a combination of both. Reading "Nutrition Facts" on packaged food The "Nutrition Facts" list is included on the labels of almost all packaged foods and beverages in the [place] It includes:  The serving size.  Information about nutrients in each serving, including the grams (g) of carbohydrate per serving. To use the "Nutrition Facts":  Decide how many servings you will have.  Multiply the number of servings by the number of carbohydrates per serving.  The resulting number is the total amount of carbohydrates that you will be having. Learning standard serving sizes of other foods When you eat carbohydrate foods that are not packaged or do not include "Nutrition Facts" on the label, you need to measure the servings in order to count the amount of carbohydrates:  Measure the foods that you will eat with a food scale or measuring cup, if needed.   Decide how many standard-size servings you will eat.  Multiply the number of servings by 15. Most carbohydrate-rich foods have about 15 g of carbohydrates per serving. ? For example, if you eat 8 oz (170 g) of strawberries, you will have eaten 2 servings and 30 g of carbohydrates (2 servings x 15 g = 30 g).  For foods that have more than one food mixed, such as soups and casseroles, you must count the carbohydrates in each food that is included. The following list contains standard serving sizes of common carbohydrate-rich foods. Each of these servings has about 15 g of carbohydrates:   hamburger bun or  English muffin.   oz (15 mL) syrup.   oz (14 g) jelly.  1 slice of bread.  1 six-inch tortilla.  3 oz (85 g) cooked rice or pasta.  4 oz (113 g) cooked dried beans.  4 oz (113 g) starchy vegetable, such as peas, corn, or potatoes.  4 oz (113 g) hot cereal.  4 oz (113 g) mashed potatoes or  of a large baked potato.  4 oz (113 g) canned or frozen fruit.  4 oz (120 mL) fruit juice.  4-6 crackers.  6 chicken nuggets.  6 oz (170 g) unsweetened dry cereal.  6 oz (170 g) plain fat-free yogurt or yogurt sweetened with artificial sweeteners.  8 oz (240 mL) milk.  8 oz (170 g) fresh fruit or one small piece of fruit.  24 oz (680 g) popped popcorn. Example of carbohydrate counting Sample meal  3 oz (85 g) chicken breast.  6 oz (170 g)   brown rice.  4 oz (113 g) corn.  8 oz (240 mL) milk.  8 oz (170 g) strawberries with sugar-free whipped topping. Carbohydrate calculation 1. Identify the foods that contain carbohydrates: ? Rice. ? Corn. ? Milk. ? Strawberries. 2. Calculate how many servings you have of each food: ? 2 servings rice. ? 1 serving corn. ? 1 serving milk. ? 1 serving strawberries. 3. Multiply each number of servings by 15 g: ? 2 servings rice x 15 g = 30 g. ? 1 serving corn x 15 g = 15 g. ? 1 serving milk x 15 g = 15 g. ? 1 serving  strawberries x 15 g = 15 g. 4. Add together all of the amounts to find the total grams of carbohydrates eaten: ? 30 g + 15 g + 15 g + 15 g = 75 g of carbohydrates total. Summary  Carbohydrate counting is a method of keeping track of how many carbohydrates you eat.  Eating carbohydrates naturally increases the amount of sugar (glucose) in the blood.  Counting how many carbohydrates you eat helps keep your blood glucose within normal limits, which helps you manage your diabetes.  A diet and nutrition specialist (registered dietitian) can help you make a meal plan and calculate how many carbohydrates you should have at each meal and snack. This information is not intended to replace advice given to you by your health care provider. Make sure you discuss any questions you have with your health care provider. Document Revised: 08/01/2016 Document Reviewed: 06/21/2015 Elsevier Patient Education  2020 Elsevier Inc. DASH Eating Plan DASH stands for "Dietary Approaches to Stop Hypertension." The DASH eating plan is a healthy eating plan that has been shown to reduce high blood pressure (hypertension). It may also reduce your risk for type 2 diabetes, heart disease, and stroke. The DASH eating plan may also help with weight loss. What are tips for following this plan?  General guidelines  Avoid eating more than 2,300 mg (milligrams) of salt (sodium) a day. If you have hypertension, you may need to reduce your sodium intake to 1,500 mg a day.  Limit alcohol intake to no more than 1 drink a day for nonpregnant women and 2 drinks a day for men. One drink equals 12 oz of beer, 5 oz of wine, or 1 oz of hard liquor.  Work with your health care provider to maintain a healthy body weight or to lose weight. Ask what an ideal weight is for you.  Get at least 30 minutes of exercise that causes your heart to beat faster (aerobic exercise) most days of the week. Activities may include walking, swimming, or  biking.  Work with your health care provider or diet and nutrition specialist (dietitian) to adjust your eating plan to your individual calorie needs. Reading food labels   Check food labels for the amount of sodium per serving. Choose foods with less than 5 percent of the Daily Value of sodium. Generally, foods with less than 300 mg of sodium per serving fit into this eating plan.  To find whole grains, look for the word "whole" as the first word in the ingredient list. Shopping  Buy products labeled as "low-sodium" or "no salt added."  Buy fresh foods. Avoid canned foods and premade or frozen meals. Cooking  Avoid adding salt when cooking. Use salt-free seasonings or herbs instead of table salt or sea salt. Check with your health care provider or pharmacist before using salt substitutes.  Do not   fry foods. Cook foods using healthy methods such as baking, boiling, grilling, and broiling instead.  Cook with heart-healthy oils, such as olive, canola, soybean, or sunflower oil. Meal planning  Eat a balanced diet that includes: ? 5 or more servings of fruits and vegetables each day. At each meal, try to fill half of your plate with fruits and vegetables. ? Up to 6-8 servings of whole grains each day. ? Less than 6 oz of lean meat, poultry, or fish each day. A 3-oz serving of meat is about the same size as a deck of cards. One egg equals 1 oz. ? 2 servings of low-fat dairy each day. ? A serving of nuts, seeds, or beans 5 times each week. ? Heart-healthy fats. Healthy fats called Omega-3 fatty acids are found in foods such as flaxseeds and coldwater fish, like sardines, salmon, and mackerel.  Limit how much you eat of the following: ? Canned or prepackaged foods. ? Food that is high in trans fat, such as fried foods. ? Food that is high in saturated fat, such as fatty meat. ? Sweets, desserts, sugary drinks, and other foods with added sugar. ? Full-fat dairy products.  Do not salt  foods before eating.  Try to eat at least 2 vegetarian meals each week.  Eat more home-cooked food and less restaurant, buffet, and fast food.  When eating at a restaurant, ask that your food be prepared with less salt or no salt, if possible. What foods are recommended? The items listed may not be a complete list. Talk with your dietitian about what dietary choices are best for you. Grains Whole-grain or whole-wheat bread. Whole-grain or whole-wheat pasta. Brown rice. Oatmeal. Quinoa. Bulgur. Whole-grain and low-sodium cereals. Pita bread. Low-fat, low-sodium crackers. Whole-wheat flour tortillas. Vegetables Fresh or frozen vegetables (raw, steamed, roasted, or grilled). Low-sodium or reduced-sodium tomato and vegetable juice. Low-sodium or reduced-sodium tomato sauce and tomato paste. Low-sodium or reduced-sodium canned vegetables. Fruits All fresh, dried, or frozen fruit. Canned fruit in natural juice (without added sugar). Meat and other protein foods Skinless chicken or turkey. Ground chicken or turkey. Pork with fat trimmed off. Fish and seafood. Egg whites. Dried beans, peas, or lentils. Unsalted nuts, nut butters, and seeds. Unsalted canned beans. Lean cuts of beef with fat trimmed off. Low-sodium, lean deli meat. Dairy Low-fat (1%) or fat-free (skim) milk. Fat-free, low-fat, or reduced-fat cheeses. Nonfat, low-sodium ricotta or cottage cheese. Low-fat or nonfat yogurt. Low-fat, low-sodium cheese. Fats and oils Soft margarine without trans fats. Vegetable oil. Low-fat, reduced-fat, or light mayonnaise and salad dressings (reduced-sodium). Canola, safflower, olive, soybean, and sunflower oils. Avocado. Seasoning and other foods Herbs. Spices. Seasoning mixes without salt. Unsalted popcorn and pretzels. Fat-free sweets. What foods are not recommended? The items listed may not be a complete list. Talk with your dietitian about what dietary choices are best for you. Grains Baked goods  made with fat, such as croissants, muffins, or some breads. Dry pasta or rice meal packs. Vegetables Creamed or fried vegetables. Vegetables in a cheese sauce. Regular canned vegetables (not low-sodium or reduced-sodium). Regular canned tomato sauce and paste (not low-sodium or reduced-sodium). Regular tomato and vegetable juice (not low-sodium or reduced-sodium). Pickles. Olives. Fruits Canned fruit in a light or heavy syrup. Fried fruit. Fruit in cream or butter sauce. Meat and other protein foods Fatty cuts of meat. Ribs. Fried meat. Bacon. Sausage. Bologna and other processed lunch meats. Salami. Fatback. Hotdogs. Bratwurst. Salted nuts and seeds. Canned beans with added salt.   Canned or smoked fish. Whole eggs or egg yolks. Chicken or turkey with skin. Dairy Whole or 2% milk, cream, and half-and-half. Whole or full-fat cream cheese. Whole-fat or sweetened yogurt. Full-fat cheese. Nondairy creamers. Whipped toppings. Processed cheese and cheese spreads. Fats and oils Butter. Stick margarine. Lard. Shortening. Ghee. Bacon fat. Tropical oils, such as coconut, palm kernel, or palm oil. Seasoning and other foods Salted popcorn and pretzels. Onion salt, garlic salt, seasoned salt, table salt, and sea salt. Worcestershire sauce. Tartar sauce. Barbecue sauce. Teriyaki sauce. Soy sauce, including reduced-sodium. Steak sauce. Canned and packaged gravies. Fish sauce. Oyster sauce. Cocktail sauce. Horseradish that you find on the shelf. Ketchup. Mustard. Meat flavorings and tenderizers. Bouillon cubes. Hot sauce and Tabasco sauce. Premade or packaged marinades. Premade or packaged taco seasonings. Relishes. Regular salad dressings. Where to find more information:  National Heart, Lung, and Blood Institute: www.nhlbi.nih.gov  American Heart Association: www.heart.org Summary  The DASH eating plan is a healthy eating plan that has been shown to reduce high blood pressure (hypertension). It may also reduce  your risk for type 2 diabetes, heart disease, and stroke.  With the DASH eating plan, you should limit salt (sodium) intake to 2,300 mg a day. If you have hypertension, you may need to reduce your sodium intake to 1,500 mg a day.  When on the DASH eating plan, aim to eat more fresh fruits and vegetables, whole grains, lean proteins, low-fat dairy, and heart-healthy fats.  Work with your health care provider or diet and nutrition specialist (dietitian) to adjust your eating plan to your individual calorie needs. This information is not intended to replace advice given to you by your health care provider. Make sure you discuss any questions you have with your health care provider. Document Revised: 12/20/2016 Document Reviewed: 01/01/2016 Elsevier Patient Education  2020 Elsevier Inc.  

## 2019-09-28 NOTE — Progress Notes (Signed)
Established Patient Office Visit  Subjective:  Patient ID: Angelica [name], female    DOB: 17-Nov-1960  Age: 59 y.o. MRN: [id number]  CC:  Chief Complaint  Patient presents with   Hypertension   Diabetes    HPI [name] presents for follow up of type 2 diabetes mellitus, hypertension and lab review. Her HgbA1c done on 09/15/2019 decreased from 8.1% to 7.6%. She checks her blood glucose daily and she states that her fasting readings are usually less than 130 mg/dl, but she forgot to check it this morning. Her blood glucose was checked during visit and it was 106 mg/dl. She states that she's compliant with her medication regimen and adheres to ADA diet. Her Lab done on 09/15/2019, C reactive protein was 22 mg/l, Sed rate was 60 mm/hr and GGT was 31. Her Urinalysis and Urine Microalbumin w/creat.ratio was wnl. She reports that Temporal artery biopsy done at [facility] was negative and her headache has resolved. She continues to take Clopidogrel 75 mg daily for Lacunar infarct and wants to know when she can stop taking medication. She denies hematuria, hematochezia , bruises and active bleeding. She also reports that her right knee buckled while walking from her mail box to the house and she fell. She denies pain and edema to right knee. Overall, she states that she's doing well and offers no further complaint.  Past Medical History:  Diagnosis Date   Bell palsy 08/04/2014   GERD (gastroesophageal reflux disease) 01/27/2015   History of Bell's palsy June 2016   Hyperlipidemia 01/27/2015   Hypertension 01/27/2015   Type 2 diabetes mellitus (Angelica [name]) 08/04/2014    Past Surgical History:  Procedure Laterality Date   ABDOMINAL HYSTERECTOMY     COLONOSCOPY WITH PROPOFOL N/A 10/07/2018   Procedure: COLONOSCOPY WITH PROPOFOL;  Surgeon: Virgel Manifold, MD;  Location: [facility] ENDOSCOPY;  Service: Gastroenterology;  Laterality: N/A;   OOPHORECTOMY Right 2005    Family History  Problem Relation  Age of Onset   Cancer Brother        Colon Cancer   Breast cancer Neg Hx     Social History   Socioeconomic History   Marital status: Single    Spouse name: Not on file   Number of children: 2   Years of education: Not on file   Highest education level: 11th grade  Occupational History   Occupation: unemployed  Tobacco Use   Smoking status: Never Smoker   Smokeless tobacco: Never Used  Scientific laboratory technician Use: Never used  Substance and Sexual Activity   Alcohol use: Yes    Comment: "maybe 2 drinks a month"   Drug use: No   Sexual activity: Yes    Birth control/protection: Post-menopausal  Other Topics Concern   Not on file  Social History Narrative   Rents house, does rely on someone else to help out financially. Not employed      On Food stamps   Social Determinants of Health   Financial Resource Strain:    Difficulty of Paying Living Expenses: Not on file  Food Insecurity:    Worried About Charity fundraiser in the Last Year: Not on file   [employer] of Food in the Last Year: Not on file  Transportation Needs:    Lack of Transportation (Medical): Not on file   Lack of Transportation (Non-Medical): Not on file  Physical Activity:    Days of Exercise per Week: Not on file   Minutes  of Exercise per Session: Not on file  Stress:    Feeling of Stress : Not on file  Social Connections:    Frequency of Communication with Friends and Family: Not on file   Frequency of Social Gatherings with Friends and Family: Not on file   Attends Religious Services: Not on file   Active Member of Clubs or Organizations: Not on file   Attends Archivist Meetings: Not on file   Marital Status: Not on file  Intimate Partner Violence:    Fear of Current or Ex-Partner: Not on file   Emotionally Abused: Not on file   Physically Abused: Not on file   Sexually Abused: Not on file    Outpatient Medications Prior to Visit  Medication Sig  Dispense Refill   atorvastatin (LIPITOR) 40 MG tablet Take 1 tablet (40 mg total) by mouth daily at 6 PM. 90 tablet 0   clopidogrel (PLAVIX) 75 MG tablet Take 1 tablet (75 mg total) by mouth daily. 90 tablet 0   insulin glargine (LANTUS SOLOSTAR) 100 UNIT/ML Solostar Pen Inject 38 Units into the skin daily at 10 pm. Increase by 4 units every 3 days if fasting blood glucose is > 180 mg/dl. 30 mL 3   metFORMIN (GLUCOPHAGE-XR) 500 MG 24 hr tablet Take 2 tablets (1,000 mg total) by mouth daily with breakfast AND 2 tablets (1,000 mg total) daily before supper. 270 tablet 3   Semaglutide (OZEMPIC) 0.25 or 0.5 MG/DOSE SOPN Inject 0.5 mg into the skin once a week.     hydrochlorothiazide (HYDRODIURIL) 12.5 MG tablet Take 1 tablet (12.5 mg total) by mouth daily. 90 tablet 0   docusate sodium (COLACE) 100 MG capsule Take 1 capsule (100 mg total) by mouth 2 (two) times daily. 30 capsule 0   nortriptyline (PAMELOR) 25 MG capsule Take 1 capsule (25 mg total) by mouth at bedtime. 30 capsule 0   pantoprazole (PROTONIX) 40 MG tablet Take 1 tablet (40 mg total) by mouth daily. 40 tablet 2   No facility-administered medications prior to visit.    No Known Allergies  ROS Review of Systems  Constitutional: Negative.   Eyes: Negative.   Respiratory: Negative.   Cardiovascular: Negative.   Endocrine: Negative.   Skin: Negative.   Neurological: Positive for weakness (right lower extremity).  Hematological: Negative.   Psychiatric/Behavioral: Negative.       Objective:    Physical Exam HENT:     Head: Normocephalic and atraumatic.  Cardiovascular:     Rate and Rhythm: Normal rate and regular rhythm.     Pulses: Normal pulses.     Heart sounds: Normal heart sounds.  Pulmonary:     Effort: Pulmonary effort is normal.     Breath sounds: Normal breath sounds.  Musculoskeletal:        General: Normal range of motion.  Skin:    General: Skin is warm and dry.  Neurological:     General: No  focal deficit present.     Mental Status: She is alert and oriented to person, place, and time. Mental status is at baseline.  Psychiatric:        Mood and Affect: Mood normal.        Behavior: Behavior normal.        Thought Content: Thought content normal.        Judgment: Judgment normal.     BP 121/83 (BP Location: Left Arm, Patient Position: Sitting, Cuff Size: Large)    Pulse 86  Temp 97.9 F (36.6 C) (Temporal)    Resp 18    Ht '5\' 3"'  (1.6 m)    Wt 195 lb 3.2 oz (88.5 kg)    LMP 03/11/2013 (Within Months)    SpO2 95%    BMI 34.58 kg/m  Wt Readings from Last 3 Encounters:  09/28/19 195 lb 3.2 oz (88.5 kg)  08/31/19 195 lb 12.8 oz (88.8 kg)  08/04/19 195 lb (88.5 kg)   She was encouraged to continue on her weight loss regimen.  Health Maintenance Due  Topic Date Due   TETANUS/TDAP  Never done   INFLUENZA VACCINE  08/22/2019    There are no preventive care reminders to display for this patient.  Lab Results  Component Value Date   TSH 1.440 04/28/2019   Lab Results  Component Value Date   WBC 6.9 06/14/2019   HGB 12.9 06/14/2019   HCT 39.2 06/14/2019   MCV 80.5 06/14/2019   PLT 338 06/14/2019   Lab Results  Component Value Date   NA 139 06/14/2019   K 3.8 06/14/2019   CO2 26 06/14/2019   GLUCOSE 141 (H) 06/14/2019   BUN 12 06/14/2019   CREATININE 0.61 06/14/2019   BILITOT 0.6 06/14/2019   ALKPHOS 113 06/14/2019   AST 18 06/14/2019   ALT 23 06/14/2019   PROT 8.0 06/14/2019   ALBUMIN 3.8 06/14/2019   CALCIUM 9.2 06/14/2019   ANIONGAP 7 06/14/2019   Lab Results  Component Value Date   CHOL 142 09/15/2019   Lab Results  Component Value Date   HDL 42 09/15/2019   Lab Results  Component Value Date   LDLCALC 85 09/15/2019   Lab Results  Component Value Date   TRIG 75 09/15/2019   Lab Results  Component Value Date   CHOLHDL 3.4 09/15/2019   Lab Results  Component Value Date   HGBA1C 7.6 (H) 09/15/2019      Assessment & Plan:    1.  Hypertension, unspecified type -Her blood pressure is under control, she will continue on current treatment regimen, DASH diet and exercise as tolerated. - hydrochlorothiazide (HYDRODIURIL) 12.5 MG tablet; Take 1 tablet (12.5 mg total) by mouth daily.  Dispense: 90 tablet; Refill: 0 - Comp Met (CMET); Future  2. Diabetes mellitus without complication ([place]) - Her HgbA1c was 7.6% and her goal should be less than 7%. She was advised to check her blood glucose at least daily and fasting reading should be between 80-130 mg/dl. She will continue on low carb/ non concentrated sweet diet. - HgB A1c; Future - Comp Met (CMET); Future  3. Lacunar infarct, acute ([place]) - She will continue on Clopidogrel 75 mg daily until - Ambulatory referral to Hematology / Oncology for Coag work up.  4. Weakness of right leg - She will follow up with [facility], Dr Vickki Hearing  5. Abnormal laboratory test result -Will recheck C reactive Protein and Sed rate in 3 months, if still elevated will refer to Rheumatology. - Sedimentation rate; Future    Follow-up: Return in about 3 months (around 12/29/2019), or if symptoms worsen or fail to improve.    Kindle Strohmeier Jerold Coombe, NP

## 2019-09-30 ENCOUNTER — Telehealth: Payer: Self-pay | Admitting: Gerontology

## 2019-10-05 ENCOUNTER — Other Ambulatory Visit: Payer: Self-pay

## 2019-10-05 ENCOUNTER — Ambulatory Visit: Payer: Medicaid Other | Admitting: Licensed Clinical Social Worker

## 2019-10-05 DIAGNOSIS — F411 Generalized anxiety disorder: Secondary | ICD-10-CM

## 2019-10-05 DIAGNOSIS — F4321 Adjustment disorder with depressed mood: Secondary | ICD-10-CM

## 2019-10-06 NOTE — BH Specialist Note (Signed)
Integrated Behavioral Health Follow Up Visit  MRN: [id number] Name: Angelica [name]    Type of Service: [place] Interpretor:No. Interpretor Name and Language: None  SUBJECTIVE: Angelica [name] is a 59 y.o. female accompanied by Herself Patient was referred by Carlyon Shadow, NP  for mental health Patient reports the following symptoms/concerns: The patient reports that she has "been doing pretty good" since her last visit. She states that today is her 13th anniversary of her sobriety. She discussed her journey to recovery after a lengthy addiction to crack cocaine and how she utilized her faith along with her family's support to succeed. She stated she plans on going out to eat to celebrate her big day with family. She reports she is still struggling to fall asleep and often has to move to the couch and cannot fall asleep until the sun starts to rise. Further the patient shared that she is still experiencing some disturbing dreams about her previous experiences, acquaintances, and her children. She states that although the have lessened some they still occur about twice a week. The patient discussed going to visit her parent [name] on Thursday. She stated that while she cried all the way there she did feel relieved after getting somethings off her chest and ended up leaving in laughter. The patient denies suicidal or homicidal thoughts.  Duration of problem: ; Severity of problem: moderate  OBJECTIVE: Mood: Euthymic and Affect: Appropriate Risk of harm to self or others: No plan to harm self or others  LIFE CONTEXT: Family and Social: see above School/Work: see above Self-Care: see above Life Changes: see above  GOALS ADDRESSED: Patient will: 1.  Reduce symptoms of: anxiety and stress  2.  Increase knowledge and/or ability of: coping skills, healthy habits and stress reduction  3.  Demonstrate ability to: Increase healthy adjustment to current  life circumstances  INTERVENTIONS: Interventions utilized:  Brief CBT was utilized by the clinician to build rapport. Clinician asked the patient some clarifying questions regarding her history with drug addiction that were unclear from her assessment. Clinician utilized supportive counseling and processed with patient how she has been doing since her last follow-up visit. The clinican processed with the patient her feelings of grief over the recent loss of her mother and offered her praise for utilizing her coping skills on Thursday when she visited the [name]. Clinician encouraged the patient to continue to share her story of recovery with others. Clinician measured the patients anxiety and depression on a numerical scale. Standardized Assessments completed: GAD-7 and PHQ 9  Gad-7 14 PHQ 9 12  ASSESSMENT: Patient currently experiencing see above  Patient may benefit from see above.  PLAN: 1. Follow up with behavioral health clinician on : 10/19/2019 at 12:00 pm  2. Behavioral recommendations:  3. Referral(s): [facility] (In Clinic) 4. "From scale of 1-10, how likely are you to follow plan?":   Lesli Albee, Student-Social Work

## 2019-10-07 ENCOUNTER — Encounter: Payer: Self-pay | Admitting: Internal Medicine

## 2019-10-07 ENCOUNTER — Other Ambulatory Visit: Payer: Self-pay

## 2019-10-07 ENCOUNTER — Inpatient Hospital Stay: Payer: Self-pay | Attending: Internal Medicine | Admitting: Internal Medicine

## 2019-10-07 ENCOUNTER — Inpatient Hospital Stay: Payer: Medicaid Other

## 2019-10-07 DIAGNOSIS — I6381 Other cerebral infarction due to occlusion or stenosis of small artery: Secondary | ICD-10-CM | POA: Insufficient documentation

## 2019-10-07 DIAGNOSIS — Z7902 Long term (current) use of antithrombotics/antiplatelets: Secondary | ICD-10-CM | POA: Insufficient documentation

## 2019-10-07 DIAGNOSIS — Z794 Long term (current) use of insulin: Secondary | ICD-10-CM | POA: Insufficient documentation

## 2019-10-07 DIAGNOSIS — I10 Essential (primary) hypertension: Secondary | ICD-10-CM | POA: Insufficient documentation

## 2019-10-07 DIAGNOSIS — E1165 Type 2 diabetes mellitus with hyperglycemia: Secondary | ICD-10-CM | POA: Insufficient documentation

## 2019-10-07 DIAGNOSIS — D6869 Other thrombophilia: Secondary | ICD-10-CM | POA: Insufficient documentation

## 2019-10-07 DIAGNOSIS — D6859 Other primary thrombophilia: Secondary | ICD-10-CM

## 2019-10-07 DIAGNOSIS — Z79899 Other long term (current) drug therapy: Secondary | ICD-10-CM | POA: Insufficient documentation

## 2019-10-07 NOTE — Assessment & Plan Note (Addendum)
#  Arterial blood clot/lacunar stroke-left-sided; with right-sided weakness.  Likely secondary to poorly controlled diabetes/hypertension/vasculopathy.  Unlikely any primary hypercoagulable state. Clinically no concerns for antiphospholipid antibody syndrome [no prior blood clots/miscarriages] I would not recommend any further work-up.  Continue antiplatelet therapy as per PCP/neurology.  # Diabetes-hemoglobin A1c 8; poorly controlled recommend close follow-up with PCP regarding tighter blood glucose control.   #Hypertension- recommend close follow-up with PCP  # Thank you, Ms. Gerarda Gunther, NP for allowing me to participate in the care of your pleasant patient. Please do not hesitate to contact me with questions or concerns in the interim.  # DISPOSITION: # follow up as needed-Dr.B

## 2019-10-07 NOTE — Progress Notes (Signed)
[facility] NOTE  Patient Care Team: Tawni Millers, MD as PCP - General (Internal Medicine) Rico Junker, RN as Registered Nurse Theodore Demark, RN as Registered Nurse  CHIEF COMPLAINTS/PURPOSE OF CONSULTATION: Hypercoagulable state  #  Oncology History   No history exists.     HISTORY OF PRESENTING ILLNESS:  Angelica [name] 59 y.o.  female with a history of hypertension hyperlipidemia diabetes poorlycontrolled [July 2021-hemoglobin A1c 8.1] Bell's palsy; history of Covid infection December 2020-was recently discharged in the hospital in July after being treated for right-sided stroke.  Patient noted to have acute lacunar infarct in the posterior limb of the left internal capsule; with right-sided leg weakness/right upper extremity numbness.  Patient was outside the window for TPA.  Patient was discharged on Plavix  Patient has been referred to [place] for further consideration of hypercoagulable state resulting in stroke.  Denies any prior history of DVT/PE. Denies any family history of blood clots.   Review of Systems  Constitutional: Negative for chills, diaphoresis, fever, malaise/fatigue and weight loss.  HENT: Negative for nosebleeds and sore throat.   Eyes: Negative for double vision.  Respiratory: Negative for cough, hemoptysis, sputum production, shortness of breath and wheezing.   Cardiovascular: Negative for chest pain, palpitations, orthopnea and leg swelling.  Gastrointestinal: Negative for abdominal pain, blood in stool, constipation, diarrhea, heartburn, melena, nausea and vomiting.  Genitourinary: Negative for dysuria, frequency and urgency.  Musculoskeletal: Positive for back pain and joint pain.  Skin: Negative.  Negative for itching and rash.  Neurological: Positive for focal weakness. Negative for dizziness, tingling, weakness and headaches.  Endo/Heme/Allergies: Does not bruise/bleed easily.  Psychiatric/Behavioral: Negative for  depression. The patient is not nervous/anxious and does not have insomnia.      MEDICAL HISTORY:  Past Medical History:  Diagnosis Date  . Bell palsy 08/04/2014  . GERD (gastroesophageal reflux disease) 01/27/2015  . History of Bell's palsy June 2016  . Hyperlipidemia 01/27/2015  . Hypertension 01/27/2015  . Type 2 diabetes mellitus ([place]) 08/04/2014    SURGICAL HISTORY: Past Surgical History:  Procedure Laterality Date  . ABDOMINAL HYSTERECTOMY    . COLONOSCOPY WITH PROPOFOL N/A 10/07/2018   Procedure: COLONOSCOPY WITH PROPOFOL;  Surgeon: Virgel Manifold, MD;  Location: [facility] ENDOSCOPY;  Service: Gastroenterology;  Laterality: N/A;  . OOPHORECTOMY Right 2005    SOCIAL HISTORY: Social History   Socioeconomic History  . Marital status: Single    Spouse name: Not on file  . Number of children: 2  . Years of education: Not on file  . Highest education level: 11th grade  Occupational History  . Occupation: unemployed  Tobacco Use  . Smoking status: Never Smoker  . Smokeless tobacco: Never Used  Vaping Use  . Vaping Use: Never used  Substance and Sexual Activity  . Alcohol use: Yes    Comment: "maybe 2 drinks a month"  . Drug use: No  . Sexual activity: Yes    Birth control/protection: Post-menopausal  Other Topics Concern  . 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 Not employed      On Food stamps   Social Determinants of Health   Financial Resource Strain: High Risk  . Difficulty of Paying Living Expenses: Very hard  Food Insecurity: No Food Insecurity  . Worried About Charity fundraiser in the Last Year: Never true  . Ran Out of Food in the  Last Year: Never true  Transportation Needs: Unmet Transportation Needs  . Lack of Transportation (Medical): Yes  . Lack of Transportation (Non-Medical): Yes  Physical Activity: Insufficiently Active  . Days of Exercise per Week: 3 days  . Minutes of Exercise  per Session: 10 min  Stress: Stress Concern Present  . Feeling of Stress : Very much  Social Connections: Moderately Isolated  . Frequency of Communication with Friends and Family: More than three times a week  . Frequency of Social Gatherings with Friends and Family: More than three times a week  . Attends Religious Services: More than 4 times per year  . Active Member of Clubs or Organizations: No  . Attends Archivist Meetings: Never  . Marital Status: Never married  Intimate Partner Violence:   . Fear of Current or Ex-Partner: Not on file  . Emotionally Abused: Not on file  . Physically Abused: Not on file  . Sexually Abused: Not on file    FAMILY HISTORY: Family History  Problem Relation Age of Onset  . Cancer Brother        Colon Cancer  . Breast cancer Neg Hx     ALLERGIES:  has No Known Allergies.  MEDICATIONS:  Current Outpatient Medications  Medication Sig Dispense Refill  . atorvastatin (LIPITOR) 40 MG tablet Take 1 tablet (40 mg total) by mouth daily at 6 PM. 90 tablet 0  . clopidogrel (PLAVIX) 75 MG tablet Take 1 tablet (75 mg total) by mouth daily. 90 tablet 0  . hydrochlorothiazide (HYDRODIURIL) 12.5 MG tablet Take 1 tablet (12.5 mg total) by mouth daily. 90 tablet 0  . insulin glargine (LANTUS SOLOSTAR) 100 UNIT/ML Solostar Pen Inject 38 Units into the skin daily at 10 pm. Increase by 4 units every 3 days if fasting blood glucose is > 180 mg/dl. 30 mL 3  . metFORMIN (GLUCOPHAGE-XR) 500 MG 24 hr tablet Take 2 tablets (1,000 mg total) by mouth daily with breakfast AND 2 tablets (1,000 mg total) daily before supper. 270 tablet 3  . nortriptyline (PAMELOR) 25 MG capsule Take 1 capsule (25 mg total) by mouth at bedtime. 30 capsule 0  . pantoprazole (PROTONIX) 40 MG tablet Take 1 tablet (40 mg total) by mouth daily. 40 tablet 2  . Semaglutide (OZEMPIC) 0.25 or 0.5 MG/DOSE SOPN Inject 0.5 mg into the skin once a week.    . docusate sodium (COLACE) 100 MG  capsule Take 1 capsule (100 mg total) by mouth 2 (two) times daily. (Patient not taking: Reported on 10/07/2019) 30 capsule 0   No current facility-administered medications for this visit.      Marland Kitchen  PHYSICAL EXAMINATION: ECOG PERFORMANCE STATUS: 0 - Asymptomatic  Vitals:   10/07/19 1140  BP: (!) 131/92  Pulse: 83  Resp: 16  Temp: (!) 96.7 F (35.9 C)  SpO2: 100%   Filed Weights   10/07/19 1140  Weight: 196 lb 6.4 oz (89.1 kg)    Physical Exam HENT:     Head: Normocephalic and atraumatic.     Mouth/Throat:     Pharynx: No oropharyngeal exudate.  Eyes:     Pupils: Pupils are equal, round, and reactive to light.  Cardiovascular:     Rate and Rhythm: Normal rate and regular rhythm.  Pulmonary:     Effort: Pulmonary effort is normal. No respiratory distress.     Breath sounds: Normal breath sounds. No wheezing.  Abdominal:     General: Bowel sounds are normal. There is no  distension.     Palpations: Abdomen is soft. There is no mass.     Tenderness: There is no abdominal tenderness. There is no guarding or rebound.  Musculoskeletal:        General: No tenderness. Normal range of motion.     Cervical back: Normal range of motion and neck supple.  Skin:    General: Skin is warm.  Neurological:     Mental Status: She is alert and oriented to person, place, and time.  Psychiatric:        Mood and Affect: Affect normal.      LABORATORY DATA:  I have reviewed the data as listed Lab Results  Component Value Date   WBC 6.9 06/14/2019   HGB 12.9 06/14/2019   HCT 39.2 06/14/2019   MCV 80.5 06/14/2019   PLT 338 06/14/2019   Recent Labs    01/20/19 1116 04/28/19 1007 06/14/19 1006  NA 139 139 139  K 4.0 4.0 3.8  CL 100 105 106  CO2 25 22 26   GLUCOSE 268* 154* 141*  BUN 10 13 12   CREATININE 0.61 0.67 0.61  CALCIUM 9.9 9.5 9.2  GFRNONAA 102 99 >60  GFRAA 117 114 >60  PROT 7.8 7.5 8.0  ALBUMIN 4.1 4.2 3.8  AST 14 16 18   ALT 24 22 23   ALKPHOS 151* 148* 113   BILITOT 0.4 <0.2 0.6    RADIOGRAPHIC STUDIES: I have personally reviewed the radiological images as listed and agreed with the findings in the report. No results found.  ASSESSMENT & PLAN:   Hypercoagulable state ([place]) #Arterial blood clot/lacunar stroke-left-sided; with right-sided weakness.  Likely secondary to poorly controlled diabetes/hypertension/vasculopathy.  Unlikely any primary hypercoagulable state. Clinically no concerns for antiphospholipid antibody syndrome [no prior blood clots/miscarriages] I would not recommend any further work-up.  Continue antiplatelet therapy as per PCP/neurology.  # Diabetes-hemoglobin A1c 8; poorly controlled recommend close follow-up with PCP regarding tighter blood glucose control.   #Hypertension- recommend close follow-up with PCP  # Thank you, Ms. Gerarda Gunther, NP for allowing me to participate in the care of your pleasant patient. Please do not hesitate to contact me with questions or concerns in the interim.  # DISPOSITION: # follow up as needed-Dr.B   All questions were answered. The patient knows to call the clinic with any problems, questions or concerns.    Cammie Sickle, MD 10/07/2019 12:23 PM

## 2019-10-11 ENCOUNTER — Other Ambulatory Visit: Payer: Self-pay

## 2019-10-11 ENCOUNTER — Other Ambulatory Visit
Admission: RE | Admit: 2019-10-11 | Discharge: 2019-10-11 | Disposition: A | Discharge status: Home / Self Care | Payer: Medicaid Other | Source: Ambulatory Visit | Attending: Gastroenterology | Admitting: Gastroenterology

## 2019-10-11 DIAGNOSIS — Z20822 Contact with and (suspected) exposure to covid-19: Secondary | ICD-10-CM | POA: Diagnosis not present

## 2019-10-11 DIAGNOSIS — Z01812 Encounter for preprocedural laboratory examination: Secondary | ICD-10-CM | POA: Insufficient documentation

## 2019-10-12 ENCOUNTER — Telehealth: Payer: Self-pay | Admitting: Pharmacist

## 2019-10-12 LAB — SARS CORONAVIRUS 2 (TAT 6-24 HRS): SARS Coronavirus 2: NEGATIVE

## 2019-10-12 NOTE — Telephone Encounter (Signed)
10/12/2019 12:02:15 PM - [employer] refill to dr for Lantus Solostar  -- Elmer Picker - Tuesday, October 12, 2019 12:00 PM --Sending [employer] refill request for Lantus Solostar Inject Max 42 units under the skin daily at 10PM # 3 boxes for Dr. Mable Fill to sign & return.

## 2019-10-13 ENCOUNTER — Encounter
Admission: RE | Disposition: A | Discharge status: Home / Self Care | Payer: Self-pay | Source: Home / Self Care | Attending: Gastroenterology

## 2019-10-13 ENCOUNTER — Encounter: Payer: Self-pay | Admitting: Registered Nurse

## 2019-10-13 ENCOUNTER — Ambulatory Visit
Admission: RE | Admit: 2019-10-13 | Discharge: 2019-10-13 | Disposition: A | Discharge status: Home / Self Care | Payer: Medicaid Other | Attending: Gastroenterology | Admitting: Gastroenterology

## 2019-10-13 ENCOUNTER — Telehealth: Payer: Self-pay

## 2019-10-13 ENCOUNTER — Other Ambulatory Visit: Payer: Self-pay

## 2019-10-13 SURGERY — COLONOSCOPY WITH PROPOFOL
Anesthesia: General

## 2019-10-13 MED ORDER — LIDOCAINE HCL (PF) 2 % IJ SOLN
INTRAMUSCULAR | Status: AC
  Filled 2019-10-13: qty 5

## 2019-10-13 MED ORDER — PROPOFOL 500 MG/50ML IV EMUL
INTRAVENOUS | Status: AC
  Filled 2019-10-13: qty 50

## 2019-10-13 MED ORDER — FENTANYL CITRATE (PF) 100 MCG/2ML IJ SOLN
INTRAMUSCULAR | Status: AC
  Filled 2019-10-13: qty 2

## 2019-10-13 NOTE — Telephone Encounter (Signed)
Patient is needing blood thinner clearance before I could reschedule her colonoscopy. Blood thinner form was sent to Dr. Mable Fill.

## 2019-10-13 NOTE — OR Nursing (Signed)
Spoke with Dr. Bonna Gains regarding patient not stopping her Plavix, last dose was yesterday. Procedure is canceled and pt was instructed to call office to reschedule.

## 2019-10-19 ENCOUNTER — Other Ambulatory Visit: Payer: Self-pay | Admitting: Gerontology

## 2019-10-19 ENCOUNTER — Ambulatory Visit: Payer: Medicaid Other | Admitting: Specialist

## 2019-10-19 ENCOUNTER — Other Ambulatory Visit: Payer: Self-pay

## 2019-10-19 ENCOUNTER — Ambulatory Visit: Payer: Medicaid Other | Admitting: Licensed Clinical Social Worker

## 2019-10-19 ENCOUNTER — Ambulatory Visit: Payer: Medicaid Other | Admitting: Gerontology

## 2019-10-19 DIAGNOSIS — I6381 Other cerebral infarction due to occlusion or stenosis of small artery: Secondary | ICD-10-CM

## 2019-10-19 DIAGNOSIS — M25561 Pain in right knee: Secondary | ICD-10-CM

## 2019-10-19 NOTE — Progress Notes (Signed)
  Subjective:     Patient ID: Angelica [name], female   DOB: Nov 02, 1960, 59 y.o.   MRN: [id number]  HPI  59 yo female with several yr hx of R knee pain. She states the knee gives out at times. She is unable to take NSAID's because she is on anticoagulants. Xrays from 2020 show on report:  mild arthritis; I think they look good for her age.    Review of Systems     Objective:   Physical Exam   On inspection she has a slight degree genu valgum of her R knee. She also has tattoo on lateral side of leg. Her gait is deliberate with short steps. She volunteers she does have pain. She is able to heel/toe walk. She declines to do a squat. Pointing to the area of maximum tenderness she points anteriorly medially.     She has full active extension. Her "Q" is normal. She has no p/f crepidiss.   In the supine position, she has at least sonivial hypertrophy if not a slight effusion. She has full extension but will only flex to 90 degrees.  In full extension and 30 degrees of flexion, there is no m/l laxity. Lachman is neg.    Assessment:         Plan:    I will order an MRI of R knee to r/o derangement

## 2019-10-21 NOTE — Telephone Encounter (Signed)
Called pt to ask social determinant questions.Patient's results showed high stress, financial resource strain, transportation being able to pick up her medication from medical management.Housing did not seem to be a major concern.

## 2019-10-22 NOTE — Telephone Encounter (Signed)
Called [facility] and asked about patient's blood thinner request and I was told that they were going to send the request to Dr. Mable Fill and that we would hear back from them soon.

## 2019-10-26 ENCOUNTER — Ambulatory Visit: Payer: Medicaid Other | Admitting: Licensed Clinical Social Worker

## 2019-10-26 ENCOUNTER — Telehealth: Payer: Self-pay | Admitting: Licensed Clinical Social Worker

## 2019-10-26 NOTE — Telephone Encounter (Signed)
Called to reschedule patient for Angelica [name], due to a minor scheduling mishap.  Patient was rescheduled.  Also inquired about the status of [name] referral for utilities.

## 2019-10-27 ENCOUNTER — Other Ambulatory Visit: Payer: Medicaid Other

## 2019-11-01 ENCOUNTER — Other Ambulatory Visit: Payer: Self-pay | Admitting: Gerontology

## 2019-11-02 ENCOUNTER — Telehealth: Payer: Self-pay | Admitting: Pharmacist

## 2019-11-02 NOTE — Telephone Encounter (Signed)
11/02/2019 3:42:15 PM - Lantus Solostar refill to [facility] - Tuesday, November 02, 2019 3:41 PM --Faxed [employer] refill request for Lantus Solostar Max 42 units under the skin daily at 10PM # 3 boxes.

## 2019-11-03 ENCOUNTER — Ambulatory Visit: Payer: Medicaid Other | Admitting: Licensed Clinical Social Worker

## 2019-11-03 ENCOUNTER — Other Ambulatory Visit: Payer: Medicaid Other

## 2019-11-03 ENCOUNTER — Ambulatory Visit: Payer: Medicaid Other | Admitting: Gerontology

## 2019-11-03 ENCOUNTER — Other Ambulatory Visit: Payer: Self-pay

## 2019-11-03 ENCOUNTER — Ambulatory Visit: Payer: Self-pay | Admitting: Licensed Clinical Social Worker

## 2019-11-03 DIAGNOSIS — F4321 Adjustment disorder with depressed mood: Secondary | ICD-10-CM

## 2019-11-03 DIAGNOSIS — F411 Generalized anxiety disorder: Secondary | ICD-10-CM

## 2019-11-03 DIAGNOSIS — I10 Essential (primary) hypertension: Secondary | ICD-10-CM

## 2019-11-03 DIAGNOSIS — E119 Type 2 diabetes mellitus without complications: Secondary | ICD-10-CM

## 2019-11-03 DIAGNOSIS — R899 Unspecified abnormal finding in specimens from other organs, systems and tissues: Secondary | ICD-10-CM

## 2019-11-03 NOTE — BH Specialist Note (Signed)
Integrated Behavioral Health Follow Up Visit  MRN: [id number] Name: Angelica [name]  Type of Service: [place] Interpretor:No. Interpretor Name and Language: none   SUBJECTIVE: Angelica [name] is a 59 y.o. female accompanied by herself Patient was referred by Carlyon Shadow, NP for mental health. Patient reports the following symptoms/concerns: The patient reports that she is going to provide care for an elderly person in [place] so that her brother and sister-in-law can go on a vacation. She reports that this will also provide her rest from her obligations here. She notes that she is feeling agitated with her boyfriend and told him they need a break from each other for a couple of weeks. The patient notes that she expects her boyfriend to do everything her way and to her standards and when he doesn't she becomes upset. She discussed the upcoming holidays and how she is preparing to make a large meal for her family. She states she has been reminiscing about holiday preparations with her mother and how it is the first year not having her there. The patient stated, "overall I am doing really well, and I am feeling better." The patient denied suicidal or homicidal thoughts.   Duration of problem:; Severity of problem: moderate  OBJECTIVE: Mood: Euthymic and Affect: Appropriate Risk of harm to self or others: No plan to harm self or others  LIFE CONTEXT: Family and Social: see above  School/Work: see above  Self-Care: see above Life Changes:see above  GOALS ADDRESSED: Patient will: 1.  Reduce symptoms of: agitation, anxiety and depression  2.  Increase knowledge and/or ability of: coping skills and self-management skills  3.  Demonstrate ability to: Increase healthy adjustment to current life circumstances  INTERVENTIONS: Interventions utilized:  Supportive Counseling was utilized by the clinician during today's follow up session. Clinician  processed with the patient how she has been doing since the last follow up session. The clinician utilized reflective listening and processed with the patient her frustrations and feelings toward her current life circumstances. The clinician explained that it is normal to still grieve the loss of her mother and that the holidays can be a time to celebrate the memories and traditions her mother passed on to her family. The clinician measured the patients anxiety and depression on a numerical scale. Standardized Assessments completed: GAD-7 and PHQ 9  GAD-7   11 PHQ-9   2  ASSESSMENT: Patient currently experiencing see above  Patient may benefit from see above  PLAN: 1. Follow up with behavioral health clinician on : 11/30/2019 at 12:00 pm  2. Behavioral recommendations:  3. Referral(s): [facility] (In Clinic) 4. "From scale of 1-10, how likely are you to follow plan?":   Lesli Albee, Student-Social Work

## 2019-11-04 ENCOUNTER — Ambulatory Visit: Payer: Medicaid Other | Admitting: Gerontology

## 2019-11-04 LAB — COMPREHENSIVE METABOLIC PANEL
ALT: 24 IU/L (ref 0–32)
AST: 15 IU/L (ref 0–40)
Albumin/Globulin Ratio: 1.2 (ref 1.2–2.2)
Albumin: 4 g/dL (ref 3.8–4.9)
Alkaline Phosphatase: 165 IU/L — ABNORMAL HIGH (ref 44–121)
BUN/Creatinine Ratio: 18 (ref 9–23)
BUN: 12 mg/dL (ref 6–24)
Bilirubin Total: <0.2 mg/dL (ref 0.0–1.2)
CO2: 25 mmol/L (ref 20–29)
Calcium: 9.6 mg/dL (ref 8.7–10.2)
Chloride: 100 mmol/L (ref 96–106)
Creatinine, Ser: 0.66 mg/dL (ref 0.57–1.00)
GFR calc Af Amer: 114 mL/min/{1.73_m2} (ref >59)
GFR calc non Af Amer: 99 mL/min/{1.73_m2} (ref >59)
Globulin, Total: 3.4 g/dL (ref 1.5–4.5)
Glucose: 310 mg/dL — ABNORMAL HIGH (ref 65–99)
Potassium: 4.1 mmol/L (ref 3.5–5.2)
Sodium: 139 mmol/L (ref 134–144)
Total Protein: 7.4 g/dL (ref 6.0–8.5)

## 2019-11-04 LAB — HEMOGLOBIN A1C
Est. average glucose Bld gHb Est-mCnc: 203 mg/dL
Hgb A1c MFr Bld: 8.7 % — ABNORMAL HIGH (ref 4.8–5.6)

## 2019-11-04 LAB — SEDIMENTATION RATE: Sed Rate: 45 mm/hr — ABNORMAL HIGH (ref 0–40)

## 2019-11-05 ENCOUNTER — Ambulatory Visit
Admission: RE | Admit: 2019-11-05 | Discharge: 2019-11-05 | Disposition: A | Discharge status: Home / Self Care | Payer: Self-pay | Source: Ambulatory Visit | Attending: Specialist | Admitting: Specialist

## 2019-11-05 ENCOUNTER — Other Ambulatory Visit: Payer: Self-pay

## 2019-11-05 DIAGNOSIS — M25561 Pain in right knee: Secondary | ICD-10-CM | POA: Insufficient documentation

## 2019-11-05 IMAGING — MR MR KNEE*R* W/O CM
6 series · 40 of 40 positions shown · non-contrast
Comparison: X-ray [DATE]

CLINICAL DATA: Medial knee pain and swelling.  No known injury

EXAM:
MRI OF THE RIGHT KNEE WITHOUT CONTRAST
TECHNIQUE: Multiplanar, multisequence MR imaging of the knee was performed. No
intravenous contrast was administered.

[Series 8: T2 fat-sat · axial · right · 4.0mm · 0.50mm/px · z∈[-47,+78]mm · 6 of 26 slices shown (1 of 3)]
[im 1/26]
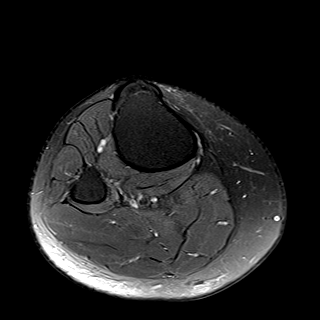
[im 6/26]
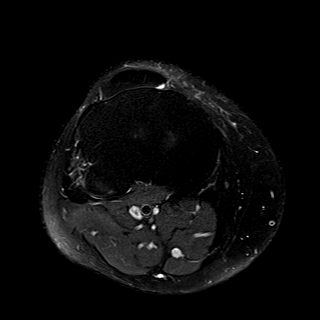
[im 11/26]
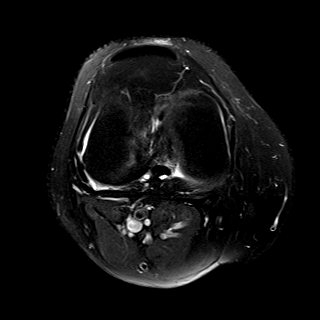
[im 16/26]
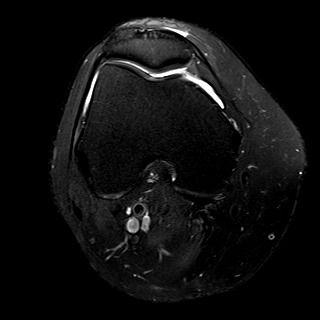
[im 21/26]
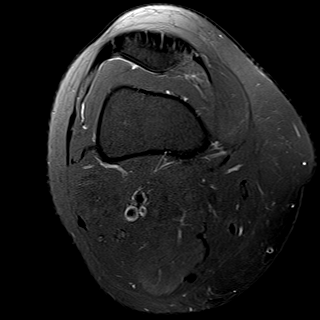
[im 26/26]
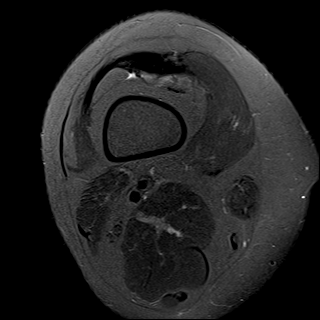

[Series 9: T2 fat-sat · coronal · right · 4.0mm · 0.59mm/px · 6 of 29 slices shown (2 of 3)]
[im 1/29]
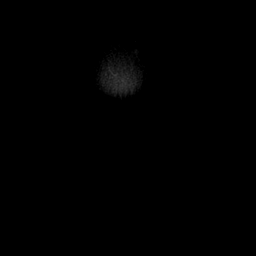
[im 6/29]
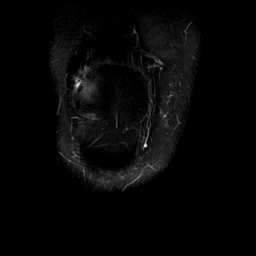
[im 12/29]
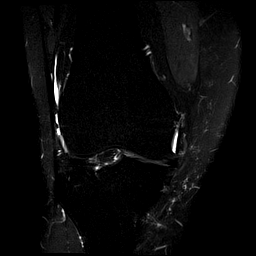
[im 17/29]
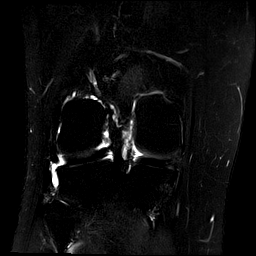
[im 23/29]
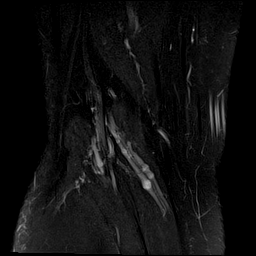
[im 29/29]
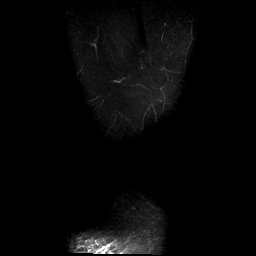

[Series 10: T1 · coronal · right · 4.0mm · 0.59mm/px · 6 of 30 slices shown]
[im 1/30]
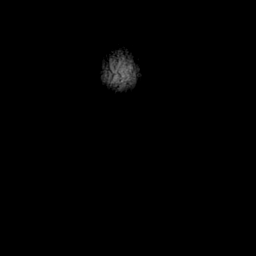
[im 6/30]
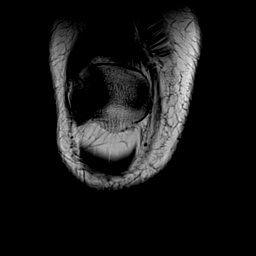
[im 12/30]
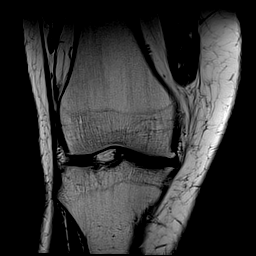
[im 18/30]
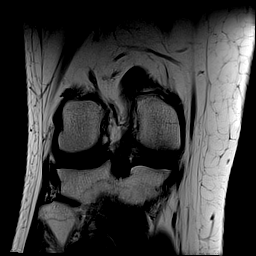
[im 24/30]
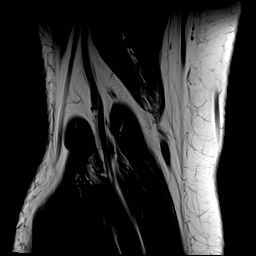
[im 30/30]
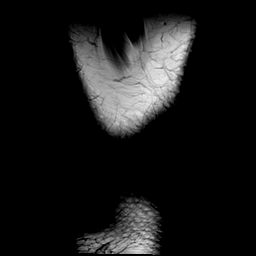

[Series 11: PD fat-sat · coronal · right · 4.0mm · 0.59mm/px · 6 of 30 slices shown (1 of 2)]
[im 1/30]
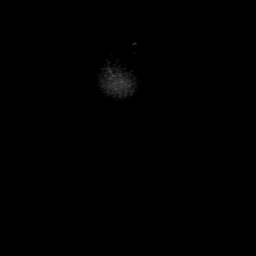
[im 6/30]
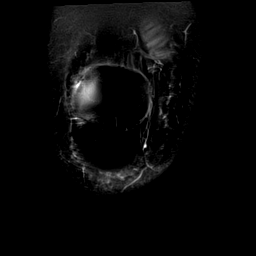
[im 12/30]
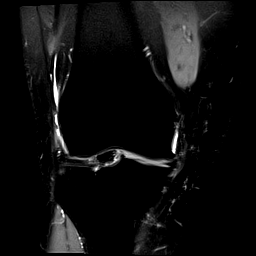
[im 18/30]
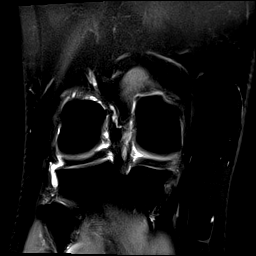
[im 24/30]
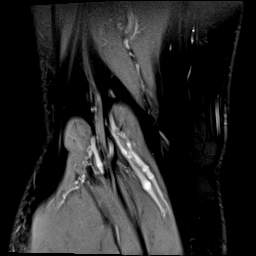
[im 30/30]
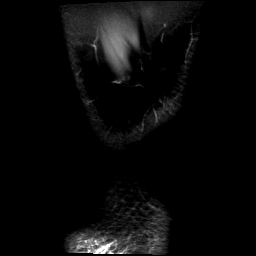

[Series 12: PD fat-sat · sagittal · right · 3.0mm · 0.59mm/px · 8 of 36 slices shown (2 of 2)]
[im 1/36]
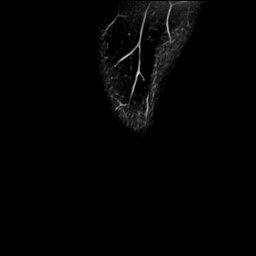
[im 6/36]
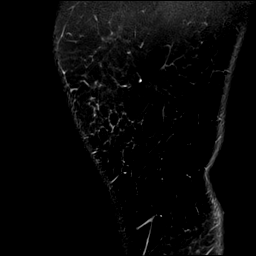
[im 11/36]
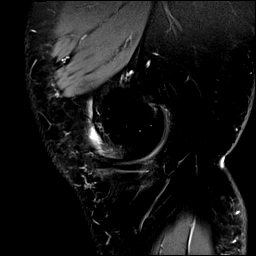
[im 16/36]
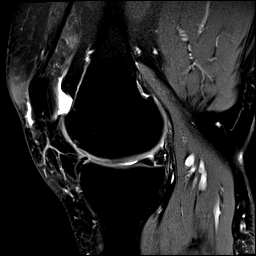
[im 21/36]
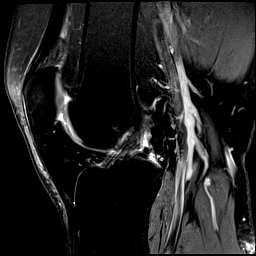
[im 26/36]
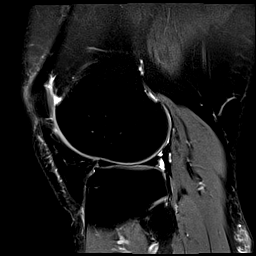
[im 31/36]
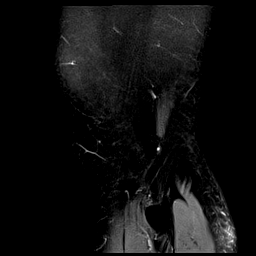
[im 36/36]
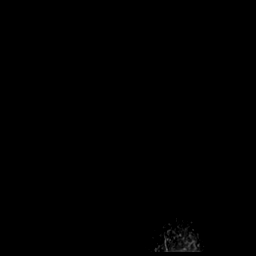

[Series 13: T2 fat-sat · sagittal · right · 3.0mm · 0.59mm/px · 8 of 36 slices shown (3 of 3)]
[im 1/36]
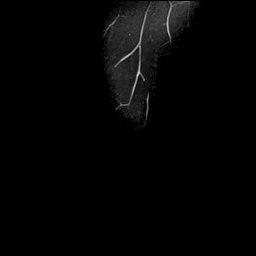
[im 6/36]
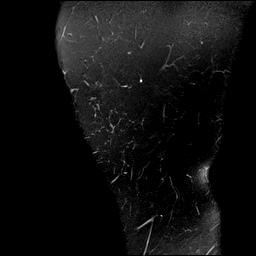
[im 11/36]
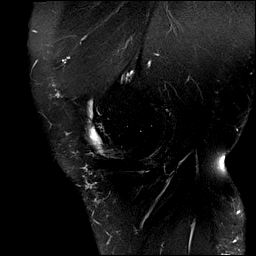
[im 16/36]
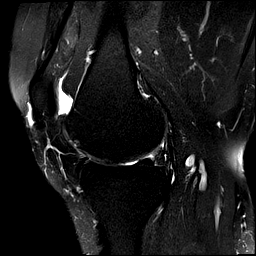
[im 21/36]
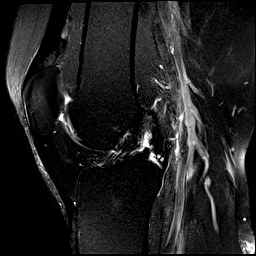
[im 26/36]
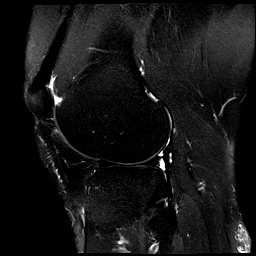
[im 31/36]
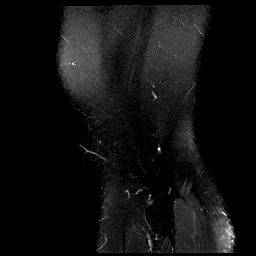
[im 36/36]
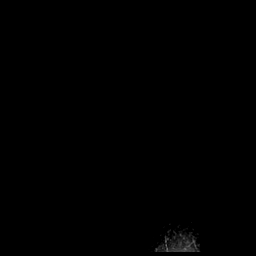

[40 of 40 positions shown; findings below may reference images not displayed]

FINDINGS: MENISCI

Medial meniscus: Irregular tear of the posterior horn extending to
the inferior articular surface (series 12, images 22-25).

Lateral meniscus:  Intact.

LIGAMENTS

Cruciates:  Intact ACL and PCL.

Collaterals: Medial collateral ligament is intact. Lateral
collateral ligament complex is intact.

CARTILAGE

Patellofemoral: High-grade near full-thickness cartilage loss within
the central trochlea (series 8, images 12-13) with minimal surface
irregularity of the patellar apex.

Medial: Irregular partial-thickness chondral ulceration of the
weight-bearing medial femoral condyle measuring approximately 4 x 10
mm (series 11, images 15-16).

Lateral: Suspect partial thickness cartilage fissure of the central
portion of the lateral tibial plateau.

Joint:  No joint effusion.  Fat pads within normal limits.

Popliteal Fossa:  No Baker cyst. Intact popliteus tendon.

Extensor Mechanism:  Intact quadriceps tendon and patellar tendon.

Bones: No focal marrow signal abnormality. No fracture or
dislocation.

Other: None.
IMPRESSION: 1. Irregular tear of the posterior horn of the medial meniscus.
2. Tricompartmental cartilage abnormalities, most severely involving
the central trochlea and medial femoral condyle.

## 2019-11-08 NOTE — Telephone Encounter (Signed)
We were able to get cardiac clearance but we need blood thinner clearance. Therefore, I refaxed the clearance to patient's PCP. Now awaiting for a response.

## 2019-11-12 ENCOUNTER — Telehealth: Payer: Self-pay | Admitting: Pharmacist

## 2019-11-12 NOTE — Telephone Encounter (Signed)
11/12/2019 2:42:44 PM - Ozempic refill to [facility]  -- Elmer Picker - Friday, November 12, 2019 2:41 PM --Sending [facility] refill request for Ozempic 0.5mg  once weekly # 5 boxes to [facility] for Chaplin to sign.

## 2019-11-15 NOTE — Telephone Encounter (Signed)
[facility] but they were closed today. I went ahead and re-faxed patient's blood thinner clearance and hopefully we will get the clearance soon.

## 2019-11-16 ENCOUNTER — Ambulatory Visit: Payer: Medicaid Other | Admitting: Gerontology

## 2019-11-16 ENCOUNTER — Other Ambulatory Visit: Payer: Self-pay

## 2019-11-16 ENCOUNTER — Other Ambulatory Visit: Payer: Self-pay | Admitting: Gerontology

## 2019-11-16 VITALS — BP 147/93 | HR 78 | Resp 16 | Wt 196.9 lb

## 2019-11-16 DIAGNOSIS — E785 Hyperlipidemia, unspecified: Secondary | ICD-10-CM

## 2019-11-16 DIAGNOSIS — K219 Gastro-esophageal reflux disease without esophagitis: Secondary | ICD-10-CM

## 2019-11-16 DIAGNOSIS — E119 Type 2 diabetes mellitus without complications: Secondary | ICD-10-CM

## 2019-11-16 DIAGNOSIS — I10 Essential (primary) hypertension: Secondary | ICD-10-CM

## 2019-11-16 DIAGNOSIS — E1165 Type 2 diabetes mellitus with hyperglycemia: Secondary | ICD-10-CM

## 2019-11-16 DIAGNOSIS — E7800 Pure hypercholesterolemia, unspecified: Secondary | ICD-10-CM

## 2019-11-16 MED ORDER — HYDROCHLOROTHIAZIDE 12.5 MG PO TABS: 12.5000 mg | ORAL_TABLET | Freq: Every day | ORAL | 0 refills | Status: DC

## 2019-11-16 MED ORDER — CLOPIDOGREL BISULFATE 75 MG PO TABS
75.0000 mg | ORAL_TABLET | Freq: Every day | ORAL | 0 refills | Status: DC
Start: 2019-11-16 — End: 2020-02-08

## 2019-11-16 MED ORDER — METFORMIN HCL ER (MOD) 1000 MG PO TB24: ORAL_TABLET | ORAL | 2 refills | Status: DC

## 2019-11-16 MED ORDER — LANTUS SOLOSTAR 100 UNIT/ML ~~LOC~~ SOPN: 38.0000 [IU] | PEN_INJECTOR | Freq: Every day | SUBCUTANEOUS | 3 refills | Status: DC

## 2019-11-16 MED ORDER — ATORVASTATIN CALCIUM 40 MG PO TABS: 40.0000 mg | ORAL_TABLET | Freq: Every day | ORAL | 0 refills | Status: DC

## 2019-11-16 MED ORDER — PANTOPRAZOLE SODIUM 40 MG PO TBEC: 40.0000 mg | DELAYED_RELEASE_TABLET | Freq: Every day | ORAL | 2 refills | Status: DC

## 2019-11-16 NOTE — Progress Notes (Signed)
Established Patient Office Visit  Subjective:  Patient ID: Angelica [name], female    DOB: 10-Jul-1960  Age: 59 y.o. MRN: [id number]  CC: No chief complaint on file.   HPI Angelica [name] presents for follow up of Type 2 diabetes mellitus, and abnormal lab value. Her HgbA1c done on 11/03/2019 increased from 7.6% to 8.7%. She states that she's compliant with her treatment regimen, though forgot to take her morning medicine, she continues to make healthy lifestyle changes and denies any medication side effects. She checks her blood glucose bid and her fasting readings are usually less that 140 mg/dl. She denies hypo/hyperglycemic symptoms, peripheral neuropathy and performs daily foot checks. She followed up with Hematologist Dr Rogue Bussing and he recommended for her to continue antiplatelet therapy and will follow up with Neurology. Her Sed Rate done on 11/03/2019 decreased from 60 mm/hr to 45 mm/hr. Overall, she states that she's doing well and offers no further complaint.  Past Medical History:  Diagnosis Date  . Bell palsy 08/04/2014  . GERD (gastroesophageal reflux disease) 01/27/2015  . History of Bell's palsy June 2016  . Hyperlipidemia 01/27/2015  . Hypertension 01/27/2015  . Type 2 diabetes mellitus ([place]) 08/04/2014    Past Surgical History:  Procedure Laterality Date  . ABDOMINAL HYSTERECTOMY    . COLONOSCOPY WITH PROPOFOL N/A 10/07/2018   Procedure: COLONOSCOPY WITH PROPOFOL;  Surgeon: Virgel Manifold, MD;  Location: [facility] ENDOSCOPY;  Service: Gastroenterology;  Laterality: N/A;  . OOPHORECTOMY Right 2005    Family History  Problem Relation Age of Onset  . Cancer Brother        Colon Cancer  . Breast cancer Neg Hx     Social History   Socioeconomic History  . Marital status: Single    Spouse name: Not on file  . Number of children: 2  . Years of education: Not on file  . Highest education level: 11th grade  Occupational History  . Occupation: unemployed  Tobacco Use   . Smoking status: Never Smoker  . Smokeless tobacco: Never Used  Vaping Use  . Vaping Use: Never used  Substance and Sexual Activity  . Alcohol use: Yes    Comment: "maybe 2 drinks a month"  . Drug use: No  . Sexual activity: Yes    Birth control/protection: Post-menopausal  Other Topics Concern  . 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 Not employed      On Food stamps   Social Determinants of Health   Financial Resource Strain: High Risk  . Difficulty of Paying Living Expenses: Very hard  Food Insecurity: No Food Insecurity  . Worried About Charity fundraiser in the Last Year: Never true  . Ran Out of Food in the Last Year: Never true  Transportation Needs: Unmet Transportation Needs  . Lack of Transportation (Medical): Yes  . Lack of Transportation (Non-Medical): Yes  Physical Activity: Insufficiently Active  . Days of Exercise per Week: 3 days  . Minutes of Exercise per Session: 10 min  Stress: Stress Concern Present  . Feeling of Stress : Very much  Social Connections: Moderately Isolated  . Frequency of Communication with Friends and Family: More than three times a week  . Frequency of Social Gatherings with Friends and Family: More than three times a week  . Attends Religious Services: More than 4 times per year  . Active Member of Clubs or Organizations: No  .  Attends Archivist Meetings: Never  . Marital Status: Never married  Intimate Partner Violence:   . Fear of Current or Ex-Partner: Not on file  . Emotionally Abused: Not on file  . Physically Abused: Not on file  . Sexually Abused: Not on file    Outpatient Medications Prior to Visit  Medication Sig Dispense Refill  . nortriptyline (PAMELOR) 25 MG capsule Take 1 capsule (25 mg total) by mouth at bedtime. 30 capsule 0  . Semaglutide (OZEMPIC) 0.25 or 0.5 MG/DOSE SOPN Inject 0.5 mg into the skin once a week.    Marland Kitchen atorvastatin  (LIPITOR) 40 MG tablet Take 1 tablet (40 mg total) by mouth daily at 6 PM. 90 tablet 0  . clopidogrel (PLAVIX) 75 MG tablet TAKE ONE TABLET BY MOUTH EVERY DAY 90 tablet 0  . hydrochlorothiazide (HYDRODIURIL) 12.5 MG tablet Take 1 tablet (12.5 mg total) by mouth daily. 90 tablet 0  . insulin glargine (LANTUS SOLOSTAR) 100 UNIT/ML Solostar Pen Inject 38 Units into the skin daily at 10 pm. Increase by 4 units every 3 days if fasting blood glucose is > 180 mg/dl. 30 mL 3  . metFORMIN (GLUCOPHAGE-XR) 500 MG 24 hr tablet Take 2 tablets (1,000 mg total) by mouth daily with breakfast AND 2 tablets (1,000 mg total) daily before supper. 270 tablet 3  . docusate sodium (COLACE) 100 MG capsule Take 1 capsule (100 mg total) by mouth 2 (two) times daily. (Patient not taking: Reported on 10/07/2019) 30 capsule 0  . pantoprazole (PROTONIX) 40 MG tablet Take 1 tablet (40 mg total) by mouth daily. 40 tablet 2   No facility-administered medications prior to visit.    No Known Allergies  ROS Review of Systems  Constitutional: Negative.   Eyes: Negative.   Respiratory: Negative.   Cardiovascular: Negative.   Endocrine: Negative.   Skin: Negative.   Neurological: Negative.   Hematological: Negative.   Psychiatric/Behavioral: Negative.       Objective:    Physical Exam HENT:     Head: Normocephalic and atraumatic.  Cardiovascular:     Rate and Rhythm: Normal rate and regular rhythm.     Pulses: Normal pulses.     Heart sounds: Normal heart sounds.  Pulmonary:     Effort: Pulmonary effort is normal.     Breath sounds: Normal breath sounds.  Skin:    General: Skin is warm and dry.  Neurological:     General: No focal deficit present.     Mental Status: She is alert and oriented to person, place, and time. Mental status is at baseline.  Psychiatric:        Mood and Affect: Mood normal.        Behavior: Behavior normal.        Thought Content: Thought content normal.        Judgment: Judgment  normal.     BP (!) 147/93 (BP Location: Left Arm, Patient Position: Sitting, Cuff Size: Large)   Pulse 78   Resp 16   Wt 196 lb 14.4 oz (89.3 kg)   LMP 03/11/2013 (Within Months)   SpO2 97%   BMI 34.88 kg/m  Wt Readings from Last 3 Encounters:  11/16/19 196 lb 14.4 oz (89.3 kg)  10/07/19 196 lb 6.4 oz (89.1 kg)  09/28/19 195 lb 3.2 oz (88.5 kg)   She was encouraged to lose weight  Health Maintenance Due  Topic Date Due  . TETANUS/TDAP  Never done  . FOOT EXAM  10/20/2019    There are no preventive care reminders to display for this patient.  Lab Results  Component Value Date   TSH 1.440 04/28/2019   Lab Results  Component Value Date   WBC 6.9 06/14/2019   HGB 12.9 06/14/2019   HCT 39.2 06/14/2019   MCV 80.5 06/14/2019   PLT 338 06/14/2019   Lab Results  Component Value Date   NA 139 11/03/2019   K 4.1 11/03/2019   CO2 25 11/03/2019   GLUCOSE 310 (H) 11/03/2019   BUN 12 11/03/2019   CREATININE 0.66 11/03/2019   BILITOT <0.2 11/03/2019   ALKPHOS 165 (H) 11/03/2019   AST 15 11/03/2019   ALT 24 11/03/2019   PROT 7.4 11/03/2019   ALBUMIN 4.0 11/03/2019   CALCIUM 9.6 11/03/2019   ANIONGAP 7 06/14/2019   Lab Results  Component Value Date   CHOL 142 09/15/2019   Lab Results  Component Value Date   HDL 42 09/15/2019   Lab Results  Component Value Date   LDLCALC 85 09/15/2019   Lab Results  Component Value Date   TRIG 75 09/15/2019   Lab Results  Component Value Date   CHOLHDL 3.4 09/15/2019   Lab Results  Component Value Date   HGBA1C 8.7 (H) 11/03/2019      Assessment & Plan:    1. Hypercholesterolemia - She will continue on current treatment regimen, -She was advised to continue on low fat/cholesterol diet and exercise as tolerated. - atorvastatin (LIPITOR) 40 MG tablet; Take 1 tablet (40 mg total) by mouth daily at 6 PM.  Dispense: 90 tablet; Refill: 0  2. Hypertension, unspecified type - Her blood pressure is not under control, she  was advised to continue on current treatment regimen, DASH diet and exercise as tolerated. - hydrochlorothiazide (HYDRODIURIL) 12.5 MG tablet; Take 1 tablet (12.5 mg total) by mouth daily.  Dispense: 90 tablet; Refill: 0   3. Diabetes mellitus without complication ([place]) - Her HgbA1c was 8.7% and her goal should be less than 7%. She will continue on current treatment regimen , low carb/non concentrated sweet diet and exercise as tolerated . - insulin glargine (LANTUS SOLOSTAR) 100 UNIT/ML Solostar Pen; Inject 38 Units into the skin daily at 10 pm. Increase by 4 units every 3 days if fasting blood glucose is > 180 mg/dl.  Dispense: 30 mL; Refill: 3 - metFORMIN (GLUMETZA) 1000 MG (MOD) 24 hr tablet; Take 1 tablet (1,000 mg total) by mouth daily with breakfast AND 1 tablet (1,000 mg total) daily before supper.  Dispense: 90 tablet; Refill: 2 - HgB A1c; Future - Sedimentation rate; Future    4. Gastroesophageal reflux disease, unspecified whether esophagitis present -Her acid reflux is under control and she will continue on current treatment regimen. -Avoid spicy, fatty and fried food -Avoid sodas and sour juices -Avoid heavy meals -Avoid eating 4 hours before bedtime -Elevate head of bed at night - pantoprazole (PROTONIX) 40 MG tablet; Take 1 tablet (40 mg total) by mouth daily.  Dispense: 40 tablet; Refill: 2    Follow-up: Return in about 12 weeks (around 02/08/2020), or if symptoms worsen or fail to improve.    Lillan Mccreadie Jerold Coombe, NP

## 2019-11-16 NOTE — Patient Instructions (Signed)
Carbohydrate Counting for Diabetes Mellitus, Adult  Carbohydrate counting is a method of keeping track of how many carbohydrates you eat. Eating carbohydrates naturally increases the amount of sugar (glucose) in the blood. Counting how many carbohydrates you eat helps keep your blood glucose within normal limits, which helps you manage your diabetes (diabetes mellitus). It is important to know how many carbohydrates you can safely have in each meal. This is different for every person. A diet and nutrition specialist (registered dietitian) can help you make a meal plan and calculate how many carbohydrates you should have at each meal and snack. Carbohydrates are found in the following foods:  Grains, such as breads and cereals.  Dried beans and soy products.  Starchy vegetables, such as potatoes, peas, and corn.  Fruit and fruit juices.  Milk and yogurt.  Sweets and snack foods, such as cake, cookies, candy, chips, and soft drinks. How do I count carbohydrates? There are two ways to count carbohydrates in food. You can use either of the methods or a combination of both. Reading "Nutrition Facts" on packaged food The "Nutrition Facts" list is included on the labels of almost all packaged foods and beverages in the [place] It includes:  The serving size.  Information about nutrients in each serving, including the grams (g) of carbohydrate per serving. To use the "Nutrition Facts":  Decide how many servings you will have.  Multiply the number of servings by the number of carbohydrates per serving.  The resulting number is the total amount of carbohydrates that you will be having. Learning standard serving sizes of other foods When you eat carbohydrate foods that are not packaged or do not include "Nutrition Facts" on the label, you need to measure the servings in order to count the amount of carbohydrates:  Measure the foods that you will eat with a food scale or measuring cup, if needed.   Decide how many standard-size servings you will eat.  Multiply the number of servings by 15. Most carbohydrate-rich foods have about 15 g of carbohydrates per serving. ? For example, if you eat 8 oz (170 g) of strawberries, you will have eaten 2 servings and 30 g of carbohydrates (2 servings x 15 g = 30 g).  For foods that have more than one food mixed, such as soups and casseroles, you must count the carbohydrates in each food that is included. The following list contains standard serving sizes of common carbohydrate-rich foods. Each of these servings has about 15 g of carbohydrates:   hamburger bun or  English muffin.   oz (15 mL) syrup.   oz (14 g) jelly.  1 slice of bread.  1 six-inch tortilla.  3 oz (85 g) cooked rice or pasta.  4 oz (113 g) cooked dried beans.  4 oz (113 g) starchy vegetable, such as peas, corn, or potatoes.  4 oz (113 g) hot cereal.  4 oz (113 g) mashed potatoes or  of a large baked potato.  4 oz (113 g) canned or frozen fruit.  4 oz (120 mL) fruit juice.  4-6 crackers.  6 chicken nuggets.  6 oz (170 g) unsweetened dry cereal.  6 oz (170 g) plain fat-free yogurt or yogurt sweetened with artificial sweeteners.  8 oz (240 mL) milk.  8 oz (170 g) fresh fruit or one small piece of fruit.  24 oz (680 g) popped popcorn. Example of carbohydrate counting Sample meal  3 oz (85 g) chicken breast.  6 oz (170 g)   brown rice.  4 oz (113 g) corn.  8 oz (240 mL) milk.  8 oz (170 g) strawberries with sugar-free whipped topping. Carbohydrate calculation 1. Identify the foods that contain carbohydrates: ? Rice. ? Corn. ? Milk. ? Strawberries. 2. Calculate how many servings you have of each food: ? 2 servings rice. ? 1 serving corn. ? 1 serving milk. ? 1 serving strawberries. 3. Multiply each number of servings by 15 g: ? 2 servings rice x 15 g = 30 g. ? 1 serving corn x 15 g = 15 g. ? 1 serving milk x 15 g = 15 g. ? 1 serving  strawberries x 15 g = 15 g. 4. Add together all of the amounts to find the total grams of carbohydrates eaten: ? 30 g + 15 g + 15 g + 15 g = 75 g of carbohydrates total. Summary  Carbohydrate counting is a method of keeping track of how many carbohydrates you eat.  Eating carbohydrates naturally increases the amount of sugar (glucose) in the blood.  Counting how many carbohydrates you eat helps keep your blood glucose within normal limits, which helps you manage your diabetes.  A diet and nutrition specialist (registered dietitian) can help you make a meal plan and calculate how many carbohydrates you should have at each meal and snack. This information is not intended to replace advice given to you by your health care provider. Make sure you discuss any questions you have with your health care provider. Document Revised: 08/01/2016 Document Reviewed: 06/21/2015 Elsevier Patient Education  2020 Elsevier Inc. DASH Eating Plan DASH stands for "Dietary Approaches to Stop Hypertension." The DASH eating plan is a healthy eating plan that has been shown to reduce high blood pressure (hypertension). It may also reduce your risk for type 2 diabetes, heart disease, and stroke. The DASH eating plan may also help with weight loss. What are tips for following this plan?  General guidelines  Avoid eating more than 2,300 mg (milligrams) of salt (sodium) a day. If you have hypertension, you may need to reduce your sodium intake to 1,500 mg a day.  Limit alcohol intake to no more than 1 drink a day for nonpregnant women and 2 drinks a day for men. One drink equals 12 oz of beer, 5 oz of wine, or 1 oz of hard liquor.  Work with your health care provider to maintain a healthy body weight or to lose weight. Ask what an ideal weight is for you.  Get at least 30 minutes of exercise that causes your heart to beat faster (aerobic exercise) most days of the week. Activities may include walking, swimming, or  biking.  Work with your health care provider or diet and nutrition specialist (dietitian) to adjust your eating plan to your individual calorie needs. Reading food labels   Check food labels for the amount of sodium per serving. Choose foods with less than 5 percent of the Daily Value of sodium. Generally, foods with less than 300 mg of sodium per serving fit into this eating plan.  To find whole grains, look for the word "whole" as the first word in the ingredient list. Shopping  Buy products labeled as "low-sodium" or "no salt added."  Buy fresh foods. Avoid canned foods and premade or frozen meals. Cooking  Avoid adding salt when cooking. Use salt-free seasonings or herbs instead of table salt or sea salt. Check with your health care provider or pharmacist before using salt substitutes.  Do not   fry foods. Cook foods using healthy methods such as baking, boiling, grilling, and broiling instead.  Cook with heart-healthy oils, such as olive, canola, soybean, or sunflower oil. Meal planning  Eat a balanced diet that includes: ? 5 or more servings of fruits and vegetables each day. At each meal, try to fill half of your plate with fruits and vegetables. ? Up to 6-8 servings of whole grains each day. ? Less than 6 oz of lean meat, poultry, or fish each day. A 3-oz serving of meat is about the same size as a deck of cards. One egg equals 1 oz. ? 2 servings of low-fat dairy each day. ? A serving of nuts, seeds, or beans 5 times each week. ? Heart-healthy fats. Healthy fats called Omega-3 fatty acids are found in foods such as flaxseeds and coldwater fish, like sardines, salmon, and mackerel.  Limit how much you eat of the following: ? Canned or prepackaged foods. ? Food that is high in trans fat, such as fried foods. ? Food that is high in saturated fat, such as fatty meat. ? Sweets, desserts, sugary drinks, and other foods with added sugar. ? Full-fat dairy products.  Do not salt  foods before eating.  Try to eat at least 2 vegetarian meals each week.  Eat more home-cooked food and less restaurant, buffet, and fast food.  When eating at a restaurant, ask that your food be prepared with less salt or no salt, if possible. What foods are recommended? The items listed may not be a complete list. Talk with your dietitian about what dietary choices are best for you. Grains Whole-grain or whole-wheat bread. Whole-grain or whole-wheat pasta. Brown rice. Oatmeal. Quinoa. Bulgur. Whole-grain and low-sodium cereals. Pita bread. Low-fat, low-sodium crackers. Whole-wheat flour tortillas. Vegetables Fresh or frozen vegetables (raw, steamed, roasted, or grilled). Low-sodium or reduced-sodium tomato and vegetable juice. Low-sodium or reduced-sodium tomato sauce and tomato paste. Low-sodium or reduced-sodium canned vegetables. Fruits All fresh, dried, or frozen fruit. Canned fruit in natural juice (without added sugar). Meat and other protein foods Skinless chicken or turkey. Ground chicken or turkey. Pork with fat trimmed off. Fish and seafood. Egg whites. Dried beans, peas, or lentils. Unsalted nuts, nut butters, and seeds. Unsalted canned beans. Lean cuts of beef with fat trimmed off. Low-sodium, lean deli meat. Dairy Low-fat (1%) or fat-free (skim) milk. Fat-free, low-fat, or reduced-fat cheeses. Nonfat, low-sodium ricotta or cottage cheese. Low-fat or nonfat yogurt. Low-fat, low-sodium cheese. Fats and oils Soft margarine without trans fats. Vegetable oil. Low-fat, reduced-fat, or light mayonnaise and salad dressings (reduced-sodium). Canola, safflower, olive, soybean, and sunflower oils. Avocado. Seasoning and other foods Herbs. Spices. Seasoning mixes without salt. Unsalted popcorn and pretzels. Fat-free sweets. What foods are not recommended? The items listed may not be a complete list. Talk with your dietitian about what dietary choices are best for you. Grains Baked goods  made with fat, such as croissants, muffins, or some breads. Dry pasta or rice meal packs. Vegetables Creamed or fried vegetables. Vegetables in a cheese sauce. Regular canned vegetables (not low-sodium or reduced-sodium). Regular canned tomato sauce and paste (not low-sodium or reduced-sodium). Regular tomato and vegetable juice (not low-sodium or reduced-sodium). Pickles. Olives. Fruits Canned fruit in a light or heavy syrup. Fried fruit. Fruit in cream or butter sauce. Meat and other protein foods Fatty cuts of meat. Ribs. Fried meat. Bacon. Sausage. Bologna and other processed lunch meats. Salami. Fatback. Hotdogs. Bratwurst. Salted nuts and seeds. Canned beans with added salt.   Canned or smoked fish. Whole eggs or egg yolks. Chicken or turkey with skin. Dairy Whole or 2% milk, cream, and half-and-half. Whole or full-fat cream cheese. Whole-fat or sweetened yogurt. Full-fat cheese. Nondairy creamers. Whipped toppings. Processed cheese and cheese spreads. Fats and oils Butter. Stick margarine. Lard. Shortening. Ghee. Bacon fat. Tropical oils, such as coconut, palm kernel, or palm oil. Seasoning and other foods Salted popcorn and pretzels. Onion salt, garlic salt, seasoned salt, table salt, and sea salt. Worcestershire sauce. Tartar sauce. Barbecue sauce. Teriyaki sauce. Soy sauce, including reduced-sodium. Steak sauce. Canned and packaged gravies. Fish sauce. Oyster sauce. Cocktail sauce. Horseradish that you find on the shelf. Ketchup. Mustard. Meat flavorings and tenderizers. Bouillon cubes. Hot sauce and Tabasco sauce. Premade or packaged marinades. Premade or packaged taco seasonings. Relishes. Regular salad dressings. Where to find more information:  National Heart, Lung, and Blood Institute: www.nhlbi.nih.gov  American Heart Association: www.heart.org Summary  The DASH eating plan is a healthy eating plan that has been shown to reduce high blood pressure (hypertension). It may also reduce  your risk for type 2 diabetes, heart disease, and stroke.  With the DASH eating plan, you should limit salt (sodium) intake to 2,300 mg a day. If you have hypertension, you may need to reduce your sodium intake to 1,500 mg a day.  When on the DASH eating plan, aim to eat more fresh fruits and vegetables, whole grains, lean proteins, low-fat dairy, and heart-healthy fats.  Work with your health care provider or diet and nutrition specialist (dietitian) to adjust your eating plan to your individual calorie needs. This information is not intended to replace advice given to you by your health care provider. Make sure you discuss any questions you have with your health care provider. Document Revised: 12/20/2016 Document Reviewed: 01/01/2016 Elsevier Patient Education  2020 Elsevier Inc.  

## 2019-11-17 DIAGNOSIS — I6381 Other cerebral infarction due to occlusion or stenosis of small artery: Secondary | ICD-10-CM | POA: Diagnosis not present

## 2019-11-17 DIAGNOSIS — G43711 Chronic migraine without aura, intractable, with status migrainosus: Secondary | ICD-10-CM | POA: Diagnosis not present

## 2019-11-24 NOTE — Telephone Encounter (Signed)
Faxed blood thinner clearance to patient's PCP again for blood thinner clearance.

## 2019-11-29 ENCOUNTER — Telehealth: Payer: Self-pay | Admitting: Pharmacist

## 2019-11-29 NOTE — Telephone Encounter (Signed)
11/29/2019 8:40:39 AM - Ozempic refill faxed to [facility] - Monday, November 29, 2019 8:38 AM -- Faxed refill to [facility] for [employer] 0.5mg  once weekly  5 boxes.

## 2019-11-30 ENCOUNTER — Ambulatory Visit: Payer: Medicaid Other | Admitting: Licensed Clinical Social Worker

## 2019-12-06 ENCOUNTER — Ambulatory Visit: Payer: Medicaid Other

## 2019-12-06 ENCOUNTER — Other Ambulatory Visit: Payer: Self-pay

## 2019-12-06 DIAGNOSIS — Z79899 Other long term (current) drug therapy: Secondary | ICD-10-CM

## 2019-12-06 NOTE — Progress Notes (Signed)
Medication Management Clinic Visit Note  Patient: Angelica [name] MRN: [id number] Date of Birth: 12-Jan-1961 PCP: Tawni Millers, MD   [name] 59 y.o. female presents for a Medication Therapy Management via telephone visit today.  LMP 03/11/2013 (Within Months)   Patient Information   Past Medical History:  Diagnosis Date  . Bell palsy 08/04/2014  . GERD (gastroesophageal reflux disease) 01/27/2015  . History of Bell's palsy June 2016  . Hyperlipidemia 01/27/2015  . Hypertension 01/27/2015  . Type 2 diabetes mellitus ([place]) 08/04/2014      Past Surgical History:  Procedure Laterality Date  . ABDOMINAL HYSTERECTOMY    . COLONOSCOPY WITH PROPOFOL N/A 10/07/2018   Procedure: COLONOSCOPY WITH PROPOFOL;  Surgeon: Virgel Manifold, MD;  Location: [facility] END[facility]COPY;  Service: Gastroenterology;  Laterality: N/A;  . OOPHORECTOMY Right 2005     Family History  Problem Relation Age of Onset  . Cancer Brother        Colon Cancer  . Breast cancer Neg Hx     New Diagnoses (since last visit): None   Lifestyle Diet: Breakfast: banana, cereal, bacon Lunch: nothing Dinner: fried or grilled chicken or fish Drinks: water and 1/2 & 1/2 tea            Social History   Substance and Sexual Activity  Alcohol Use Yes   Comment: "maybe 2 drinks a month"      Social History   Tobacco Use  Smoking Status Never Smoker  Smokeless Tobacco Never Used      Health Maintenance  Topic Date Due  . TETANUS/TDAP  Never done  . INFLUENZA VACCINE  04/20/2020 (Originally 08/22/2019)  . HEMOGLOBIN A1C  05/03/2020  . OPHTHALMOLOGY EXAM  05/31/2020  . URINE MICROALBUMIN  09/14/2020  . FOOT EXAM  11/15/2020  . MAMMOGRAM  11/24/2020  . PAP SMEAR-Modifier  11/24/2021  . COLONOSCOPY  10/06/2028  . PNEUMOCOCCAL POLYSACCHARIDE VACCINE AGE 22-64 HIGH RISK  Completed  . COVID-19 Vaccine  Completed  . Hepatitis C Screening  Completed  . HIV Screening  Completed   Outpatient Encounter  Medications as of 12/06/2019  Medication Sig  . atorvastatin (LIPITOR) 40 MG tablet Take 1 tablet (40 mg total) by mouth daily at 6 PM.  . clopidogrel (PLAVIX) 75 MG tablet Take 1 tablet (75 mg total) by mouth daily.  . hydrochlorothiazide (HYDRODIURIL) 12.5 MG tablet Take 1 tablet (12.5 mg total) by mouth daily.  . insulin glargine (LANTUS SOLOSTAR) 100 UNIT/ML Solostar Pen Inject 38 Units into the skin daily at 10 pm. Increase by 4 units every 3 days if fasting blood glucose is > 180 mg/dl.  . metFORMIN (GLUMETZA) 1000 MG (MOD) 24 hr tablet Take 1 tablet (1,000 mg total) by mouth daily with breakfast AND 1 tablet (1,000 mg total) daily before supper.  . nortriptyline (PAMELOR) 25 MG capsule Take 1 capsule (25 mg total) by mouth at bedtime.  . Semaglutide (OZEMPIC) 0.25 or 0.5 MG/DOSE SOPN Inject 0.5 mg into the skin once a week.  . pantoprazole (PROTONIX) 40 MG tablet Take 1 tablet (40 mg total) by mouth daily. (Patient not taking: Reported on 12/06/2019)   No facility-administered encounter medications on file as of 12/06/2019.   Health Maintenance/Date Completed  Last ED visit: Unknown Last Visit to PCP: 11/16/19 Next Visit to PCP: 12/08/19 Dental Exam: Unknown Eye Exam: Unknown Pelvic/PAP Exam: 2020 Mammogram: 2020, scheduled next week Colonoscopy: Needs to reschedule Flu Vaccine: pt refused COVID-19 Vaccine: 2nd dose completed 09/22/19  Assessment and Plan:  Access/Adherence: Pt reports no barriers to medications, housing, transportation, or food.  Type 2 diabetes: Pt is taking metformin 1000 mg twice daily, semaglutide 0.5 mg SQ weekly, and Lantus 38 units at bedtime. Pt does not monitor blood glucose at home. a1c 10/31/19: 8.7%.  Plan: Continue with metformin twice daily, Lantus at bedtime, and semaglutide weekly.   Migraines: Pt takes rimegepant 75 mg daily as needed. Pt was recently prescribed Emgality but has not began injections yet.  Plan: Continue with rimegepant as  needed. Start Emgality injections.  Hypertension: Pt takes hydrochlorothiazide 12.5 mg daily. Pt does not monitor blood pressure at home. Most recent reading 11/16/19: 147/93. Plan: Continue hydrochlorothiazide daily.  Stroke: Pt takes clopidogrel 75 mg and atorvastatin 40 mg daily. Plan: Continue clopidogrel and atorvastatin daily.   Hot flashes: Pt takes nortriptyline 25 mg at bedtime. Plan: Continue nortriptyline at bedtime.   Muscle spasms: Pt takes baclofen 10 mg twice daily as needed. Plan: Continue baclofen as needed.   Follow up in 1 year for Outreach MTM  Benn Moulder, PharmD Pharmacy Resident  12/06/2019 10:17 AM

## 2019-12-08 ENCOUNTER — Ambulatory Visit: Payer: Medicaid Other | Admitting: Licensed Clinical Social Worker

## 2019-12-10 ENCOUNTER — Other Ambulatory Visit: Payer: Self-pay | Admitting: Student in an Organized Health Care Education/Training Program

## 2019-12-14 ENCOUNTER — Other Ambulatory Visit: Payer: Self-pay

## 2019-12-14 ENCOUNTER — Ambulatory Visit: Payer: Medicaid Other | Admitting: Specialist

## 2019-12-14 DIAGNOSIS — M1990 Unspecified osteoarthritis, unspecified site: Secondary | ICD-10-CM | POA: Insufficient documentation

## 2019-12-14 DIAGNOSIS — M179 Osteoarthritis of knee, unspecified: Secondary | ICD-10-CM

## 2019-12-14 DIAGNOSIS — M1710 Unilateral primary osteoarthritis, unspecified knee: Secondary | ICD-10-CM

## 2019-12-14 NOTE — Progress Notes (Signed)
°  Subjective:     Patient ID: Angelica [name], female   DOB: 06-11-1960, 59 y.o.   MRN: [id number]  HPI  The MRI shows tri compartmental OA with a Degerative type tear posterior horn of the medial meniscus will refer her for further care.      Objective:        Assessment:     *    Plan:     Orthopedic referal

## 2019-12-15 ENCOUNTER — Ambulatory Visit: Payer: Medicaid Other

## 2019-12-21 ENCOUNTER — Ambulatory Visit: Payer: Medicaid Other | Admitting: Specialist

## 2019-12-22 ENCOUNTER — Other Ambulatory Visit: Payer: Medicaid Other

## 2019-12-22 ENCOUNTER — Other Ambulatory Visit: Payer: Self-pay

## 2019-12-22 DIAGNOSIS — E119 Type 2 diabetes mellitus without complications: Secondary | ICD-10-CM

## 2019-12-23 LAB — HEMOGLOBIN A1C
Est. average glucose Bld gHb Est-mCnc: 223 mg/dL
Hgb A1c MFr Bld: 9.4 % — ABNORMAL HIGH (ref 4.8–5.6)

## 2019-12-23 LAB — SEDIMENTATION RATE: Sed Rate: 50 mm/hr — ABNORMAL HIGH (ref 0–40)

## 2019-12-28 ENCOUNTER — Other Ambulatory Visit: Payer: Self-pay

## 2019-12-28 ENCOUNTER — Encounter: Payer: Self-pay | Admitting: Gerontology

## 2019-12-28 ENCOUNTER — Other Ambulatory Visit: Payer: Self-pay | Admitting: Gerontology

## 2019-12-28 ENCOUNTER — Ambulatory Visit: Payer: Medicaid Other | Admitting: Gerontology

## 2019-12-28 VITALS — BP 127/87 | HR 81 | Temp 97.2°F | Resp 16 | Wt 196.0 lb

## 2019-12-28 DIAGNOSIS — E119 Type 2 diabetes mellitus without complications: Secondary | ICD-10-CM

## 2019-12-28 DIAGNOSIS — I10 Essential (primary) hypertension: Secondary | ICD-10-CM

## 2019-12-28 DIAGNOSIS — R899 Unspecified abnormal finding in specimens from other organs, systems and tissues: Secondary | ICD-10-CM

## 2019-12-28 MED ORDER — LANTUS SOLOSTAR 100 UNIT/ML ~~LOC~~ SOPN: 40.0000 [IU] | PEN_INJECTOR | Freq: Every day | SUBCUTANEOUS | 3 refills | Status: DC

## 2019-12-28 NOTE — Progress Notes (Signed)
Established Patient Office Visit  Subjective:  Patient ID: Angelica [name], female    DOB: 1960-02-26  Age: 59 y.o. MRN: [id number]  CC: No chief complaint on file.   HPI Angelica [name] presents for follow up of Type 2 diabetes and lab review. Her HgbA1c done on 12/22/2019 increased from 8.7% to 9.4%. She states that she's compliant with her medication, denies side effects and continues to work on adhering to ADA and DASH diet.  She states that she's been drinking one can of soda daily and will work on [employer]. She checks her blood glucose 3-4 times weekly, and has not checked it this week.  Her blood glucose was 125 mg/dl when checked during visit. She denies hypo/hyperglycemic symptoms, peripheral neuropathy and performs daily foot checks. 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 She deferred scheduling an appointment with the Orthopedic  Pending her [facility] financial application approval. She denies pain, weakness and neuropathy to her right leg. Overall, she states that she's doing well and offers no further complaint.  Past Medical History:  Diagnosis Date  . Bell palsy 08/04/2014  . GERD (gastroesophageal reflux disease) 01/27/2015  . History of Bell's palsy June 2016  . Hyperlipidemia 01/27/2015  . Hypertension 01/27/2015  . Type 2 diabetes mellitus ([place]) 08/04/2014    Past Surgical History:  Procedure Laterality Date  . ABDOMINAL HYSTERECTOMY    . COLONOSCOPY WITH PROPOFOL N/A 10/07/2018   Procedure: COLONOSCOPY WITH PROPOFOL;  Surgeon: Virgel Manifold, MD;  Location: [facility] ENDOSCOPY;  Service: Gastroenterology;  Laterality: N/A;  . OOPHORECTOMY Right 2005    Family History  Problem Relation Age of Onset  . Cancer Brother        Colon Cancer  . Breast cancer Neg Hx     Social History    Socioeconomic History  . Marital status: Single    Spouse name: Not on file  . Number of children: 2  . Years of education: Not on file  . Highest education level: 11th grade  Occupational History  . Occupation: unemployed  Tobacco Use  . Smoking status: Never Smoker  . Smokeless tobacco: Never Used  Vaping Use  . Vaping Use: Never used  Substance and Sexual Activity  . Alcohol use: Yes    Comment: "maybe 2 drinks a month"  . Drug use: No  . Sexual activity: Yes    Birth control/protection: Post-menopausal  Other Topics Concern  . 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 Not employed      On Food stamps   Social Determinants of Health   Financial Resource Strain: High Risk  . Difficulty of Paying Living Expenses: Very hard  Food Insecurity: No Food Insecurity  . Worried About Charity fundraiser in the Last Year: Never true  . Ran Out of Food in the Last Year: Never true  Transportation Needs: Unmet Transportation Needs  . Lack of Transportation (Medical): Yes  . Lack of Transportation (Non-Medical): Yes  Physical Activity: Insufficiently Active  . Days of Exercise per Week: 3 days  . Minutes of Exercise per Session: 10 min  Stress: Stress Concern Present  . Feeling of Stress : Very much  Social Connections: Moderately Isolated  . Frequency of Communication with Friends and  Family: More than three times a week  . Frequency of Social Gatherings with Friends and Family: More than three times a week  . Attends Religious Services: More than 4 times per year  . Active Member of Clubs or Organizations: No  . Attends Archivist Meetings: Never  . Marital Status: Never married  Intimate Partner Violence:   . Fear of Current or Ex-Partner: Not on file  . Emotionally Abused: Not on file  . Physically Abused: Not on file  . Sexually Abused: Not on file    Outpatient Medications Prior to Visit   Medication Sig Dispense Refill  . atorvastatin (LIPITOR) 40 MG tablet Take 1 tablet (40 mg total) by mouth daily at 6 PM. 90 tablet 0  . baclofen (LIORESAL) 10 MG tablet Take 10 mg by mouth 2 (two) times daily. As needed    . clopidogrel (PLAVIX) 75 MG tablet Take 1 tablet (75 mg total) by mouth daily. 90 tablet 0  . hydrochlorothiazide (HYDRODIURIL) 12.5 MG tablet Take 1 tablet (12.5 mg total) by mouth daily. 90 tablet 0  . metFORMIN (GLUMETZA) 1000 MG (MOD) 24 hr tablet Take 1 tablet (1,000 mg total) by mouth daily with breakfast AND 1 tablet (1,000 mg total) daily before supper. 90 tablet 2  . nortriptyline (PAMELOR) 25 MG capsule Take 1 capsule (25 mg total) by mouth at bedtime. 30 capsule 0  . Semaglutide (OZEMPIC) 0.25 or 0.5 MG/DOSE SOPN Inject 0.5 mg into the skin once a week.    . insulin glargine (LANTUS SOLOSTAR) 100 UNIT/ML Solostar Pen Inject 38 Units into the skin daily at 10 pm. Increase by 4 units every 3 days if fasting blood glucose is > 180 mg/dl. 30 mL 3  . Galcanezumab-gnlm (EMGALITY) 120 MG/ML SOAJ Inject into the skin. (Patient not taking: Reported on 12/06/2019)    . Rimegepant Sulfate 75 MG TBDP Take 75 mg by mouth daily. As needed - do not exceed more than 1 dose in 24 hours     No facility-administered medications prior to visit.    No Known Allergies  ROS Review of Systems  Constitutional: Negative.   Eyes: Negative.   Respiratory: Negative.   Cardiovascular: Negative.   Endocrine: Negative.   Skin: Negative.   Neurological: Negative.   Hematological: Negative.   Psychiatric/Behavioral: Negative.       Objective:    Physical Exam HENT:     Head: Normocephalic and atraumatic.  Eyes:     Extraocular Movements: Extraocular movements intact.     Conjunctiva/sclera: Conjunctivae normal.     Pupils: Pupils are equal, round, and reactive to light.  Cardiovascular:     Rate and Rhythm: Normal rate and regular rhythm.     Pulses: Normal pulses.     Heart  sounds: Normal heart sounds.  Pulmonary:     Effort: Pulmonary effort is normal.     Breath sounds: Normal breath sounds.  Skin:    General: Skin is warm and dry.  Neurological:     General: No focal deficit present.     Mental Status: She is alert and oriented to person, place, and time. Mental status is at baseline.  Psychiatric:        Mood and Affect: Mood normal.        Behavior: Behavior normal.        Thought Content: Thought content normal.        Judgment: Judgment normal.     BP 127/87 (BP Location: Left  Arm, Patient Position: Sitting, Cuff Size: Large)   Pulse 81   Temp (!) 97.2 F (36.2 C)   Resp 16   Wt 196 lb (88.9 kg)   LMP 03/11/2013 (Within Months)   SpO2 99%   BMI 34.72 kg/m  Wt Readings from Last 3 Encounters:  12/28/19 196 lb (88.9 kg)  11/16/19 196 lb 14.4 oz (89.3 kg)  10/07/19 196 lb 6.4 oz (89.1 kg)   She was encouraged to loose weight.  Health Maintenance Due  Topic Date Due  . TETANUS/TDAP  Never done    There are no preventive care reminders to display for this patient.  Lab Results  Component Value Date   TSH 1.440 04/28/2019   Lab Results  Component Value Date   WBC 6.9 06/14/2019   HGB 12.9 06/14/2019   HCT 39.2 06/14/2019   MCV 80.5 06/14/2019   PLT 338 06/14/2019   Lab Results  Component Value Date   NA 139 11/03/2019   K 4.1 11/03/2019   CO2 25 11/03/2019   GLUCOSE 310 (H) 11/03/2019   BUN 12 11/03/2019   CREATININE 0.66 11/03/2019   BILITOT <0.2 11/03/2019   ALKPHOS 165 (H) 11/03/2019   AST 15 11/03/2019   ALT 24 11/03/2019   PROT 7.4 11/03/2019   ALBUMIN 4.0 11/03/2019   CALCIUM 9.6 11/03/2019   ANIONGAP 7 06/14/2019   Lab Results  Component Value Date   CHOL 142 09/15/2019   Lab Results  Component Value Date   HDL 42 09/15/2019   Lab Results  Component Value Date   LDLCALC 85 09/15/2019   Lab Results  Component Value Date   TRIG 75 09/15/2019   Lab Results  Component Value Date   CHOLHDL 3.4  09/15/2019   Lab Results  Component Value Date   HGBA1C 9.4 (H) 12/22/2019      Assessment & Plan:    1. Diabetes mellitus without complication ([place]) - Her diabetes is not controlled, her HgbA1c was 9.4% and her goal is less than 7%. She was encouraged to check her blood glucose bid, her fasting reading should be between 80-130 mg/dl. She will continue on low carb/ non concentrated sweet diet and exercise as tolerated. Her Glargine was increased to 40 units at qhs. - insulin glargine (LANTUS SOLOSTAR) 100 UNIT/ML Solostar Pen; Inject 40 Units into the skin at bedtime. Increase by 4 units every 3 days if fasting blood glucose is > 180 mg/dl.  Dispense: 30 mL; Refill: 3 - HgB A1c; Future  2. Primary hypertension - Her blood pressure is under control and she will continue on current medication, DASH diet and exercise as tolerated.  3. Abnormal laboratory test result - Her Sedimentation rate increased from 45 to 50 mm/hr and Alkaline Phosphatase was 165 IU/L on 11/03/2019. She denies any pain, fever nor chills, will continue to monitor Sed rate and will check GGT. - Gamma GT; Future    Follow-up: Return in about 3 months (around 03/29/2020), or if symptoms worsen or fail to improve.    Angelica Stork Jerold Coombe, NP

## 2019-12-28 NOTE — Patient Instructions (Signed)
DASH Eating Plan DASH stands for "Dietary Approaches to Stop Hypertension." The DASH eating plan is a healthy eating plan that has been shown to reduce high blood pressure (hypertension). It may also reduce your risk for type 2 diabetes, heart disease, and stroke. The DASH eating plan may also help with weight loss. What are tips for following this plan?  General guidelines  Avoid eating more than 2,300 mg (milligrams) of salt (sodium) a day. If you have hypertension, you may need to reduce your sodium intake to 1,500 mg a day.  Limit alcohol intake to no more than 1 drink a day for nonpregnant women and 2 drinks a day for men. One drink equals 12 oz of beer, 5 oz of wine, or 1 oz of hard liquor.  Work with your health care provider to maintain a healthy body weight or to lose weight. Ask what an ideal weight is for you.  Get at least 30 minutes of exercise that causes your heart to beat faster (aerobic exercise) most days of the week. Activities may include walking, swimming, or biking.  Work with your health care provider or diet and nutrition specialist (dietitian) to adjust your eating plan to your individual calorie needs. Reading food labels   Check food labels for the amount of sodium per serving. Choose foods with less than 5 percent of the Daily Value of sodium. Generally, foods with less than 300 mg of sodium per serving fit into this eating plan.  To find whole grains, look for the word "whole" as the first word in the ingredient list. Shopping  Buy products labeled as "low-sodium" or "no salt added."  Buy fresh foods. Avoid canned foods and premade or frozen meals. Cooking  Avoid adding salt when cooking. Use salt-free seasonings or herbs instead of table salt or sea salt. Check with your health care provider or pharmacist before using salt substitutes.  Do not fry foods. Cook foods using healthy methods such as baking, boiling, grilling, and broiling instead.  Cook with  heart-healthy oils, such as olive, canola, soybean, or sunflower oil. Meal planning  Eat a balanced diet that includes: ? 5 or more servings of fruits and vegetables each day. At each meal, try to fill half of your plate with fruits and vegetables. ? Up to 6-8 servings of whole grains each day. ? Less than 6 oz of lean meat, poultry, or fish each day. A 3-oz serving of meat is about the same size as a deck of cards. One egg equals 1 oz. ? 2 servings of low-fat dairy each day. ? A serving of nuts, seeds, or beans 5 times each week. ? Heart-healthy fats. Healthy fats called Omega-3 fatty acids are found in foods such as flaxseeds and coldwater fish, like sardines, salmon, and mackerel.  Limit how much you eat of the following: ? Canned or prepackaged foods. ? Food that is high in trans fat, such as fried foods. ? Food that is high in saturated fat, such as fatty meat. ? Sweets, desserts, sugary drinks, and other foods with added sugar. ? Full-fat dairy products.  Do not salt foods before eating.  Try to eat at least 2 vegetarian meals each week.  Eat more home-cooked food and less restaurant, buffet, and fast food.  When eating at a restaurant, ask that your food be prepared with less salt or no salt, if possible. What foods are recommended? The items listed may not be a complete list. Talk with your dietitian about   what dietary choices are best for you. Grains Whole-grain or whole-wheat bread. Whole-grain or whole-wheat pasta. Brown rice. Oatmeal. Quinoa. Bulgur. Whole-grain and low-sodium cereals. Pita bread. Low-fat, low-sodium crackers. Whole-wheat flour tortillas. Vegetables Fresh or frozen vegetables (raw, steamed, roasted, or grilled). Low-sodium or reduced-sodium tomato and vegetable juice. Low-sodium or reduced-sodium tomato sauce and tomato paste. Low-sodium or reduced-sodium canned vegetables. Fruits All fresh, dried, or frozen fruit. Canned fruit in natural juice (without  added sugar). Meat and other protein foods Skinless chicken or turkey. Ground chicken or turkey. Pork with fat trimmed off. Fish and seafood. Egg whites. Dried beans, peas, or lentils. Unsalted nuts, nut butters, and seeds. Unsalted canned beans. Lean cuts of beef with fat trimmed off. Low-sodium, lean deli meat. Dairy Low-fat (1%) or fat-free (skim) milk. Fat-free, low-fat, or reduced-fat cheeses. Nonfat, low-sodium ricotta or cottage cheese. Low-fat or nonfat yogurt. Low-fat, low-sodium cheese. Fats and oils Soft margarine without trans fats. Vegetable oil. Low-fat, reduced-fat, or light mayonnaise and salad dressings (reduced-sodium). Canola, safflower, olive, soybean, and sunflower oils. Avocado. Seasoning and other foods Herbs. Spices. Seasoning mixes without salt. Unsalted popcorn and pretzels. Fat-free sweets. What foods are not recommended? The items listed may not be a complete list. Talk with your dietitian about what dietary choices are best for you. Grains Baked goods made with fat, such as croissants, muffins, or some breads. Dry pasta or rice meal packs. Vegetables Creamed or fried vegetables. Vegetables in a cheese sauce. Regular canned vegetables (not low-sodium or reduced-sodium). Regular canned tomato sauce and paste (not low-sodium or reduced-sodium). Regular tomato and vegetable juice (not low-sodium or reduced-sodium). Pickles. Olives. Fruits Canned fruit in a light or heavy syrup. Fried fruit. Fruit in cream or butter sauce. Meat and other protein foods Fatty cuts of meat. Ribs. Fried meat. Bacon. Sausage. Bologna and other processed lunch meats. Salami. Fatback. Hotdogs. Bratwurst. Salted nuts and seeds. Canned beans with added salt. Canned or smoked fish. Whole eggs or egg yolks. Chicken or turkey with skin. Dairy Whole or 2% milk, cream, and half-and-half. Whole or full-fat cream cheese. Whole-fat or sweetened yogurt. Full-fat cheese. Nondairy creamers. Whipped toppings.  Processed cheese and cheese spreads. Fats and oils Butter. Stick margarine. Lard. Shortening. Ghee. Bacon fat. Tropical oils, such as coconut, palm kernel, or palm oil. Seasoning and other foods Salted popcorn and pretzels. Onion salt, garlic salt, seasoned salt, table salt, and sea salt. Worcestershire sauce. Tartar sauce. Barbecue sauce. Teriyaki sauce. Soy sauce, including reduced-sodium. Steak sauce. Canned and packaged gravies. Fish sauce. Oyster sauce. Cocktail sauce. Horseradish that you find on the shelf. Ketchup. Mustard. Meat flavorings and tenderizers. Bouillon cubes. Hot sauce and Tabasco sauce. Premade or packaged marinades. Premade or packaged taco seasonings. Relishes. Regular salad dressings. Where to find more information:  National Heart, Lung, and Blood Institute: www.nhlbi.nih.gov  American Heart Association: www.heart.org Summary  The DASH eating plan is a healthy eating plan that has been shown to reduce high blood pressure (hypertension). It may also reduce your risk for type 2 diabetes, heart disease, and stroke.  With the DASH eating plan, you should limit salt (sodium) intake to 2,300 mg a day. If you have hypertension, you may need to reduce your sodium intake to 1,500 mg a day.  When on the DASH eating plan, aim to eat more fresh fruits and vegetables, whole grains, lean proteins, low-fat dairy, and heart-healthy fats.  Work with your health care provider or diet and nutrition specialist (dietitian) to adjust your eating plan to your   individual calorie needs. This information is not intended to replace advice given to you by your health care provider. Make sure you discuss any questions you have with your health care provider. Document Revised: 12/20/2016 Document Reviewed: 01/01/2016 Elsevier Patient Education  2020 [employer] Inc. Carbohydrate Counting for Diabetes Mellitus, Adult  Carbohydrate counting is a method of keeping track of how many carbohydrates you  eat. Eating carbohydrates naturally increases the amount of sugar (glucose) in the blood. Counting how many carbohydrates you eat helps keep your blood glucose within normal limits, which helps you manage your diabetes (diabetes mellitus). It is important to know how many carbohydrates you can safely have in each meal. This is different for every person. A diet and nutrition specialist (registered dietitian) can help you make a meal plan and calculate how many carbohydrates you should have at each meal and snack. Carbohydrates are found in the following foods:  Grains, such as breads and cereals.  Dried beans and soy products.  Starchy vegetables, such as potatoes, peas, and corn.  Fruit and fruit juices.  Milk and yogurt.  Sweets and snack foods, such as cake, cookies, candy, chips, and soft drinks. How do I count carbohydrates? There are two ways to count carbohydrates in food. You can use either of the methods or a combination of both. Reading "Nutrition Facts" on packaged food The "Nutrition Facts" list is included on the labels of almost all packaged foods and beverages in the [place] It includes:  The serving size.  Information about nutrients in each serving, including the grams (g) of carbohydrate per serving. To use the "Nutrition Facts":  Decide how many servings you will have.  Multiply the number of servings by the number of carbohydrates per serving.  The resulting number is the total amount of carbohydrates that you will be having. Learning standard serving sizes of other foods When you eat carbohydrate foods that are not packaged or do not include "Nutrition Facts" on the label, you need to measure the servings in order to count the amount of carbohydrates:  Measure the foods that you will eat with a food scale or measuring cup, if needed.  Decide how many standard-size servings you will eat.  Multiply the number of servings by 15. Most carbohydrate-rich foods have  about 15 g of carbohydrates per serving. ? For example, if you eat 8 oz (170 g) of strawberries, you will have eaten 2 servings and 30 g of carbohydrates (2 servings x 15 g = 30 g).  For foods that have more than one food mixed, such as soups and casseroles, you must count the carbohydrates in each food that is included. The following list contains standard serving sizes of common carbohydrate-rich foods. Each of these servings has about 15 g of carbohydrates:   hamburger bun or  English muffin.   oz (15 mL) syrup.   oz (14 g) jelly.  1 slice of bread.  1 six-inch tortilla.  3 oz (85 g) cooked rice or pasta.  4 oz (113 g) cooked dried beans.  4 oz (113 g) starchy vegetable, such as peas, corn, or potatoes.  4 oz (113 g) hot cereal.  4 oz (113 g) mashed potatoes or  of a large baked potato.  4 oz (113 g) canned or frozen fruit.  4 oz (120 mL) fruit juice.  4-6 crackers.  6 chicken nuggets.  6 oz (170 g) unsweetened dry cereal.  6 oz (170 g) plain fat-free yogurt or yogurt sweetened with   artificial sweeteners.  8 oz (240 mL) milk.  8 oz (170 g) fresh fruit or one small piece of fruit.  24 oz (680 g) popped popcorn. Example of carbohydrate counting Sample meal  3 oz (85 g) chicken breast.  6 oz (170 g) brown rice.  4 oz (113 g) corn.  8 oz (240 mL) milk.  8 oz (170 g) strawberries with sugar-free whipped topping. Carbohydrate calculation 1. Identify the foods that contain carbohydrates: ? Rice. ? Corn. ? Milk. ? Strawberries. 2. Calculate how many servings you have of each food: ? 2 servings rice. ? 1 serving corn. ? 1 serving milk. ? 1 serving strawberries. 3. Multiply each number of servings by 15 g: ? 2 servings rice x 15 g = 30 g. ? 1 serving corn x 15 g = 15 g. ? 1 serving milk x 15 g = 15 g. ? 1 serving strawberries x 15 g = 15 g. 4. Add together all of the amounts to find the total grams of carbohydrates eaten: ? 30 g + 15 g + 15 g + 15  g = 75 g of carbohydrates total. Summary  Carbohydrate counting is a method of keeping track of how many carbohydrates you eat.  Eating carbohydrates naturally increases the amount of sugar (glucose) in the blood.  Counting how many carbohydrates you eat helps keep your blood glucose within normal limits, which helps you manage your diabetes.  A diet and nutrition specialist (registered dietitian) can help you make a meal plan and calculate how many carbohydrates you should have at each meal and snack. This information is not intended to replace advice given to you by your health care provider. Make sure you discuss any questions you have with your health care provider. Document Revised: 08/01/2016 Document Reviewed: 06/21/2015 Elsevier Patient Education  2020 [employer].  

## 2019-12-28 NOTE — Telephone Encounter (Signed)
Called [facility] to check up on her blood thinner form and I was transferred to [facility] and asked who was patient's PCP and I was told that it was Dr. Renato Battles. I then asked for their fax number and I was given [phone]. I will go ahead and fax the form again.

## 2019-12-29 ENCOUNTER — Ambulatory Visit: Payer: Medicaid Other | Admitting: Licensed Clinical Social Worker

## 2020-01-04 ENCOUNTER — Ambulatory Visit: Payer: Medicaid Other | Admitting: *Deleted

## 2020-01-11 ENCOUNTER — Telehealth: Payer: Self-pay | Admitting: Pharmacist

## 2020-01-11 NOTE — Telephone Encounter (Signed)
01/11/2020 9:01:42 AM - Lantus Solostar refill/dose change to [facility]  -- Elmer Picker - Tuesday, January 11, 2020 8:59 AM --Received pharmacy printout for Lantus Solostar dose increase Inject 40 units under the skin at bedtime. Increase by 4 units every 3 days if fasting blood glucose is >180mg . Printed refill request and sending to [facility] for Dr. Mable Fill to sign.

## 2020-01-12 ENCOUNTER — Ambulatory Visit: Payer: Medicaid Other

## 2020-01-12 NOTE — Telephone Encounter (Signed)
Cardiac clearance was refaxed to [phone].

## 2020-01-31 ENCOUNTER — Other Ambulatory Visit: Payer: Self-pay

## 2020-02-02 ENCOUNTER — Other Ambulatory Visit: Payer: Medicaid Other

## 2020-02-02 ENCOUNTER — Other Ambulatory Visit: Payer: Self-pay

## 2020-02-02 DIAGNOSIS — R899 Unspecified abnormal finding in specimens from other organs, systems and tissues: Secondary | ICD-10-CM

## 2020-02-02 DIAGNOSIS — E119 Type 2 diabetes mellitus without complications: Secondary | ICD-10-CM

## 2020-02-03 LAB — GAMMA GT: GGT: 31 IU/L (ref 0–60)

## 2020-02-03 LAB — HEMOGLOBIN A1C
Est. average glucose Bld gHb Est-mCnc: 220 mg/dL
Hgb A1c MFr Bld: 9.3 % — ABNORMAL HIGH (ref 4.8–5.6)

## 2020-02-08 ENCOUNTER — Telehealth: Payer: Medicaid Other | Admitting: Gerontology

## 2020-02-08 ENCOUNTER — Other Ambulatory Visit: Payer: Self-pay | Admitting: Gerontology

## 2020-02-08 ENCOUNTER — Other Ambulatory Visit: Payer: Self-pay

## 2020-02-08 DIAGNOSIS — E7800 Pure hypercholesterolemia, unspecified: Secondary | ICD-10-CM

## 2020-02-08 DIAGNOSIS — I10 Essential (primary) hypertension: Secondary | ICD-10-CM

## 2020-02-08 DIAGNOSIS — E785 Hyperlipidemia, unspecified: Secondary | ICD-10-CM

## 2020-02-08 DIAGNOSIS — E119 Type 2 diabetes mellitus without complications: Secondary | ICD-10-CM

## 2020-02-08 MED ORDER — CLOPIDOGREL BISULFATE 75 MG PO TABS: 75.0000 mg | ORAL_TABLET | Freq: Every day | ORAL | 0 refills | Status: DC

## 2020-02-08 MED ORDER — HYDROCHLOROTHIAZIDE 12.5 MG PO TABS: 12.5000 mg | ORAL_TABLET | Freq: Every day | ORAL | 0 refills | Status: DC

## 2020-02-08 MED ORDER — SEMAGLUTIDE(0.25 OR 0.5MG/DOS) 2 MG/1.5ML ~~LOC~~ SOPN
0.5000 mg | PEN_INJECTOR | SUBCUTANEOUS | 3 refills | Status: DC
Start: 2020-02-08 — End: 2020-02-08

## 2020-02-08 MED ORDER — ATORVASTATIN CALCIUM 40 MG PO TABS: 40.0000 mg | ORAL_TABLET | Freq: Every day | ORAL | 0 refills | Status: DC

## 2020-02-08 NOTE — Patient Instructions (Signed)
https://www.nhlbi.nih.gov/files/docs/public/heart/dash_brief.pdf">  DASH Eating Plan DASH stands for Dietary Approaches to Stop Hypertension. The DASH eating plan is a healthy eating plan that has been shown to:  Reduce high blood pressure (hypertension).  Reduce your risk for type 2 diabetes, heart disease, and stroke.  Help with weight loss. What are tips for following this plan? Reading food labels  Check food labels for the amount of salt (sodium) per serving. Choose foods with less than 5 percent of the Daily Value of sodium. Generally, foods with less than 300 milligrams (mg) of sodium per serving fit into this eating plan.  To find whole grains, look for the word "whole" as the first word in the ingredient list. Shopping  Buy products labeled as "low-sodium" or "no salt added."  Buy fresh foods. Avoid canned foods and pre-made or frozen meals. Cooking  Avoid adding salt when cooking. Use salt-free seasonings or herbs instead of table salt or sea salt. Check with your health care provider or pharmacist before using salt substitutes.  Do not fry foods. Cook foods using healthy methods such as baking, boiling, grilling, roasting, and broiling instead.  Cook with heart-healthy oils, such as olive, canola, avocado, soybean, or sunflower oil. Meal planning  Eat a balanced diet that includes: ? 4 or more servings of fruits and 4 or more servings of vegetables each day. Try to fill one-half of your plate with fruits and vegetables. ? 6-8 servings of whole grains each day. ? Less than 6 oz (170 g) of lean meat, poultry, or fish each day. A 3-oz (85-g) serving of meat is about the same size as a deck of cards. One egg equals 1 oz (28 g). ? 2-3 servings of low-fat dairy each day. One serving is 1 cup (237 mL). ? 1 serving of nuts, seeds, or beans 5 times each week. ? 2-3 servings of heart-healthy fats. Healthy fats called omega-3 fatty acids are found in foods such as walnuts,  flaxseeds, fortified milks, and eggs. These fats are also found in cold-water fish, such as sardines, salmon, and mackerel.  Limit how much you eat of: ? Canned or prepackaged foods. ? Food that is high in trans fat, such as some fried foods. ? Food that is high in saturated fat, such as fatty meat. ? Desserts and other sweets, sugary drinks, and other foods with added sugar. ? Full-fat dairy products.  Do not salt foods before eating.  Do not eat more than 4 egg yolks a week.  Try to eat at least 2 vegetarian meals a week.  Eat more home-cooked food and less restaurant, buffet, and fast food.   Lifestyle  When eating at a restaurant, ask that your food be prepared with less salt or no salt, if possible.  If you drink alcohol: ? Limit how much you use to:  0-1 drink a day for women who are not pregnant.  0-2 drinks a day for men. ? Be aware of how much alcohol is in your drink. In the [place] one drink equals one 12 oz bottle of beer (355 mL), one 5 oz glass of wine (148 mL), or one 1 oz glass of hard liquor (44 mL). General information  Avoid eating more than 2,300 mg of salt a day. If you have hypertension, you may need to reduce your sodium intake to 1,500 mg a day.  Work with your health care provider to maintain a healthy body weight or to lose weight. Ask what an ideal weight is for   you.  Get at least 30 minutes of exercise that causes your heart to beat faster (aerobic exercise) most days of the week. Activities may include walking, swimming, or biking.  Work with your health care provider or dietitian to adjust your eating plan to your individual calorie needs. What foods should I eat? Fruits All fresh, dried, or frozen fruit. Canned fruit in natural juice (without added sugar). Vegetables Fresh or frozen vegetables (raw, steamed, roasted, or grilled). Low-sodium or reduced-sodium tomato and vegetable juice. Low-sodium or reduced-sodium tomato sauce and tomato paste.  Low-sodium or reduced-sodium canned vegetables. Grains Whole-grain or whole-wheat bread. Whole-grain or whole-wheat pasta. Brown rice. Oatmeal. Quinoa. Bulgur. Whole-grain and low-sodium cereals. Pita bread. Low-fat, low-sodium crackers. Whole-wheat flour tortillas. Meats and other proteins Skinless chicken or turkey. Ground chicken or turkey. Pork with fat trimmed off. Fish and seafood. Egg whites. Dried beans, peas, or lentils. Unsalted nuts, nut butters, and seeds. Unsalted canned beans. Lean cuts of beef with fat trimmed off. Low-sodium, lean precooked or cured meat, such as sausages or meat loaves. Dairy Low-fat (1%) or fat-free (skim) milk. Reduced-fat, low-fat, or fat-free cheeses. Nonfat, low-sodium ricotta or cottage cheese. Low-fat or nonfat yogurt. Low-fat, low-sodium cheese. Fats and oils Soft margarine without trans fats. Vegetable oil. Reduced-fat, low-fat, or light mayonnaise and salad dressings (reduced-sodium). Canola, safflower, olive, avocado, soybean, and sunflower oils. Avocado. Seasonings and condiments Herbs. Spices. Seasoning mixes without salt. Other foods Unsalted popcorn and pretzels. Fat-free sweets. The items listed above may not be a complete list of foods and beverages you can eat. Contact a dietitian for more information. What foods should I avoid? Fruits Canned fruit in a light or heavy syrup. Fried fruit. Fruit in cream or butter sauce. Vegetables Creamed or fried vegetables. Vegetables in a cheese sauce. Regular canned vegetables (not low-sodium or reduced-sodium). Regular canned tomato sauce and paste (not low-sodium or reduced-sodium). Regular tomato and vegetable juice (not low-sodium or reduced-sodium). Pickles. Olives. Grains Baked goods made with fat, such as croissants, muffins, or some breads. Dry pasta or rice meal packs. Meats and other proteins Fatty cuts of meat. Ribs. Fried meat. Bacon. Bologna, salami, and other precooked or cured meats, such as  sausages or meat loaves. Fat from the back of a pig (fatback). Bratwurst. Salted nuts and seeds. Canned beans with added salt. Canned or smoked fish. Whole eggs or egg yolks. Chicken or turkey with skin. Dairy Whole or 2% milk, cream, and half-and-half. Whole or full-fat cream cheese. Whole-fat or sweetened yogurt. Full-fat cheese. Nondairy creamers. Whipped toppings. Processed cheese and cheese spreads. Fats and oils Butter. Stick margarine. Lard. Shortening. Ghee. Bacon fat. Tropical oils, such as coconut, palm kernel, or palm oil. Seasonings and condiments Onion salt, garlic salt, seasoned salt, table salt, and sea salt. Worcestershire sauce. Tartar sauce. Barbecue sauce. Teriyaki sauce. Soy sauce, including reduced-sodium. Steak sauce. Canned and packaged gravies. Fish sauce. Oyster sauce. Cocktail sauce. Store-bought horseradish. Ketchup. Mustard. Meat flavorings and tenderizers. Bouillon cubes. Hot sauces. Pre-made or packaged marinades. Pre-made or packaged taco seasonings. Relishes. Regular salad dressings. Other foods Salted popcorn and pretzels. The items listed above may not be a complete list of foods and beverages you should avoid. Contact a dietitian for more information. Where to find more information  National Heart, Lung, and Blood Institute: www.nhlbi.nih.gov  American Heart Association: www.heart.org  Academy of Nutrition and Dietetics: www.eatright.org  National Kidney Foundation: www.kidney.org Summary  The DASH eating plan is a healthy eating plan that has been shown to   reduce high blood pressure (hypertension). It may also reduce your risk for type 2 diabetes, heart disease, and stroke.  When on the DASH eating plan, aim to eat more fresh fruits and vegetables, whole grains, lean proteins, low-fat dairy, and heart-healthy fats.  With the DASH eating plan, you should limit salt (sodium) intake to 2,300 mg a day. If you have hypertension, you may need to reduce your  sodium intake to 1,500 mg a day.  Work with your health care provider or dietitian to adjust your eating plan to your individual calorie needs. This information is not intended to replace advice given to you by your health care provider. Make sure you discuss any questions you have with your health care provider. Document Revised: 12/11/2018 Document Reviewed: 12/11/2018 Elsevier Patient Education  2021 Elsevier Inc. https://www.diabeteseducator.org/docs/default-source/living-with-diabetes/conquering-the-grocery-store-v1.pdf?sfvrsn=4">  Carbohydrate Counting for Diabetes Mellitus, Adult Carbohydrate counting is a method of keeping track of how many carbohydrates you eat. Eating carbohydrates naturally increases the amount of sugar (glucose) in the blood. Counting how many carbohydrates you eat improves your blood glucose control, which helps you manage your diabetes. It is important to know how many carbohydrates you can safely have in each meal. This is different for every person. A dietitian can help you make a meal plan and calculate how many carbohydrates you should have at each meal and snack. What foods contain carbohydrates? Carbohydrates are found in the following foods:  Grains, such as breads and cereals.  Dried beans and soy products.  Starchy vegetables, such as potatoes, peas, and corn.  Fruit and fruit juices.  Milk and yogurt.  Sweets and snack foods, such as cake, cookies, candy, chips, and soft drinks.   How do I count carbohydrates in foods? There are two ways to count carbohydrates in food. You can read food labels or learn standard serving sizes of foods. You can use either of the methods or a combination of both. Using the Nutrition Facts label The Nutrition Facts list is included on the labels of almost all packaged foods and beverages in the [place] It includes:  The serving size.  Information about nutrients in each serving, including the grams (g) of carbohydrate  per serving. To use the Nutrition Facts:  Decide how many servings you will have.  Multiply the number of servings by the number of carbohydrates per serving.  The resulting number is the total amount of carbohydrates that you will be having. Learning the standard serving sizes of foods When you eat carbohydrate foods that are not packaged or do not include Nutrition Facts on the label, you need to measure the servings in order to count the amount of carbohydrates.  Measure the foods that you will eat with a food scale or measuring cup, if needed.  Decide how many standard-size servings you will eat.  Multiply the number of servings by 15. For foods that contain carbohydrates, one serving equals 15 g of carbohydrates. ? For example, if you eat 2 cups or 10 oz (300 g) of strawberries, you will have eaten 2 servings and 30 g of carbohydrates (2 servings x 15 g = 30 g).  For foods that have more than one food mixed, such as soups and casseroles, you must count the carbohydrates in each food that is included. The following list contains standard serving sizes of common carbohydrate-rich foods. Each of these servings has about 15 g of carbohydrates:  1 slice of bread.  1 six-inch (15 cm) tortilla.  ? cup or 2   oz (53 g) cooked rice or pasta.   cup or 3 oz (85 g) cooked or canned, drained and rinsed beans or lentils.   cup or 3 oz (85 g) starchy vegetable, such as peas, corn, or squash.   cup or 4 oz (120 g) hot cereal.   cup or 3 oz (85 g) boiled or mashed potatoes, or  or 3 oz (85 g) of a large baked potato.   cup or 4 fl oz (118 mL) fruit juice.  1 cup or 8 fl oz (237 mL) milk.  1 small or 4 oz (106 g) apple.   or 2 oz (63 g) of a medium banana.  1 cup or 5 oz (150 g) strawberries.  3 cups or 1 oz (24 g) popped popcorn. What is an example of carbohydrate counting? To calculate the number of carbohydrates in this sample meal, follow the steps shown below. Sample  meal  3 oz (85 g) chicken breast.  ? cup or 4 oz (106 g) brown rice.   cup or 3 oz (85 g) corn.  1 cup or 8 fl oz (237 mL) milk.  1 cup or 5 oz (150 g) strawberries with sugar-free whipped topping. Carbohydrate calculation 1. Identify the foods that contain carbohydrates: ? Rice. ? Corn. ? Milk. ? Strawberries. 2. Calculate how many servings you have of each food: ? 2 servings rice. ? 1 serving corn. ? 1 serving milk. ? 1 serving strawberries. 3. Multiply each number of servings by 15 g: ? 2 servings rice x 15 g = 30 g. ? 1 serving corn x 15 g = 15 g. ? 1 serving milk x 15 g = 15 g. ? 1 serving strawberries x 15 g = 15 g. 4. Add together all of the amounts to find the total grams of carbohydrates eaten: ? 30 g + 15 g + 15 g + 15 g = 75 g of carbohydrates total. What are tips for following this plan? Shopping  Develop a meal plan and then make a shopping list.  Buy fresh and frozen vegetables, fresh and frozen fruit, dairy, eggs, beans, lentils, and whole grains.  Look at food labels. Choose foods that have more fiber and less sugar.  Avoid processed foods and foods with added sugars. Meal planning  Aim to have the same amount of carbohydrates at each meal and for each snack time.  Plan to have regular, balanced meals and snacks. Where to find more information  American Diabetes Association: www.diabetes.org  Centers for Disease Control and Prevention: www.cdc.gov Summary  Carbohydrate counting is a method of keeping track of how many carbohydrates you eat.  Eating carbohydrates naturally increases the amount of sugar (glucose) in the blood.  Counting how many carbohydrates you eat improves your blood glucose control, which helps you manage your diabetes.  A dietitian can help you make a meal plan and calculate how many carbohydrates you should have at each meal and snack. This information is not intended to replace advice given to you by your health care  provider. Make sure you discuss any questions you have with your health care provider. Document Revised: 01/07/2019 Document Reviewed: 01/08/2019 Elsevier Patient Education  2021 Elsevier Inc.  

## 2020-02-08 NOTE — Progress Notes (Signed)
Established Patient Office Visit  Subjective:  Patient ID: Angelica [name], female    DOB: [birth year]  Age: 60 y.o. MRN: [id number]  CC: No chief complaint on file.  Patient consents to telephone visit and 2 patient identifiers was used to identify patient.  HPI Angelica [name] presents for follow up of Type 2 diabetes mellitus and lab review. Her HgbA1c done on 02/02/20 decreased from 9.4% one month ago to 9.3%. She states that she's compliant with her medication regimen, checks her blood glucose every other day. She last checked her blood glucose on Sunday and it was 135 mg/dl .She denies hypo/hyperglycemia symptoms, peripheral neuropathy and performs daily foot checks.Marland Kitchen Her GGT was within normal limits. She reports experiencing intermittent non radiating mild Migraine headache 2 times in a week. She reports that she will start Galcanezumab-gnlm [facility]) for Migraine headache this week. She continues to follow up at [facility] for Headache. She denies chest pain, palpitation, shortness of breath, vision changes. Overall, she states that she's doing well and offers no further complaint.  Past Medical History:  Diagnosis Date  . Bell palsy 08/04/2014  . GERD (gastroesophageal reflux disease) 01/27/2015  . History of Bell's palsy June 2016  . Hyperlipidemia 01/27/2015  . Hypertension 01/27/2015  . Type 2 diabetes mellitus ([place]) 08/04/2014    Past Surgical History:  Procedure Laterality Date  . ABDOMINAL HYSTERECTOMY    . COLONOSCOPY WITH PROPOFOL N/A 10/07/2018   Procedure: COLONOSCOPY WITH PROPOFOL;  Surgeon: Virgel Manifold, MD;  Location: [facility] ENDOSCOPY;  Service: Gastroenterology;  Laterality: N/A;  . OOPHORECTOMY Right 2005    Family History  Problem Relation Age of Onset  . Cancer Brother        Colon Cancer  . Breast cancer Neg Hx     Social History   Socioeconomic History  . Marital status: Single    Spouse name: Not on file  . Number of children: 2  . Years of  education: Not on file  . Highest education level: 11th grade  Occupational History  . Occupation: unemployed  Tobacco Use  . Smoking status: Never Smoker  . Smokeless tobacco: Never Used  Vaping Use  . Vaping Use: Never used  Substance and Sexual Activity  . Alcohol use: Yes    Comment: "maybe 2 drinks a month"  . Drug use: No  . Sexual activity: Yes    Birth control/protection: Post-menopausal  Other Topics Concern  . 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 Not employed      On Food stamps   Social Determinants of Health   Financial Resource Strain: High Risk  . Difficulty of Paying Living Expenses: Very hard  Food Insecurity: No Food Insecurity  . Worried About Charity fundraiser in the Last Year: Never true  . Ran Out of Food in the Last Year: Never true  Transportation Needs: Unmet Transportation Needs  . Lack of Transportation (Medical): Yes  . Lack of Transportation (Non-Medical): Yes  Physical Activity: Insufficiently Active  . Days of Exercise per Week: 3 days  . Minutes of Exercise per Session: 10 min  Stress: Stress Concern Present  . Feeling of Stress : Very much  Social Connections: Moderately Isolated  . Frequency of Communication with Friends and Family: More than three times a week  . Frequency of Social Gatherings with Friends and Family: More than three times a week  .  Attends Religious Services: More than 4 times per year  . Active Member of Clubs or Organizations: No  . Attends Archivist Meetings: Never  . Marital Status: Never married  Intimate Partner Violence: Not on file    Outpatient Medications Prior to Visit  Medication Sig Dispense Refill  . Galcanezumab-gnlm (EMGALITY) 120 MG/ML SOAJ Inject into the skin.    Marland Kitchen insulin glargine (LANTUS SOLOSTAR) 100 UNIT/ML Solostar Pen Inject 40 Units into the skin at bedtime. Increase by 4 units every 3 days if fasting blood  glucose is > 180 mg/dl. 30 mL 3  . metFORMIN (GLUMETZA) 1000 MG (MOD) 24 hr tablet Take 1 tablet (1,000 mg total) by mouth daily with breakfast AND 1 tablet (1,000 mg total) daily before supper. 90 tablet 2  . atorvastatin (LIPITOR) 40 MG tablet Take 1 tablet (40 mg total) by mouth daily at 6 PM. 90 tablet 0  . clopidogrel (PLAVIX) 75 MG tablet Take 1 tablet (75 mg total) by mouth daily. 90 tablet 0  . hydrochlorothiazide (HYDRODIURIL) 12.5 MG tablet Take 1 tablet (12.5 mg total) by mouth daily. 90 tablet 0  . Semaglutide,0.25 or 0.5MG /DOS, 2 MG/1.5ML SOPN Inject 0.5 mg into the skin once a week.    . baclofen (LIORESAL) 10 MG tablet Take 10 mg by mouth 2 (two) times daily. As needed    . nortriptyline (PAMELOR) 25 MG capsule Take 1 capsule (25 mg total) by mouth at bedtime. (Patient not taking: Reported on 02/08/2020) 30 capsule 0  . Rimegepant Sulfate 75 MG TBDP Take 75 mg by mouth daily. As needed - do not exceed more than 1 dose in 24 hours     No facility-administered medications prior to visit.    No Known Allergies  ROS Review of Systems  Constitutional: Negative.   Respiratory: Negative.   Cardiovascular: Negative.   Endocrine: Negative.   Skin: Negative.   Neurological: Negative.   Hematological: Negative.   Psychiatric/Behavioral: Negative.       Objective:    Physical Exam No Physical done LMP 03/11/2013 (Within Months)  Wt Readings from Last 3 Encounters:  02/02/20 195 lb (88.5 kg)  12/28/19 196 lb (88.9 kg)  11/16/19 196 lb 14.4 oz (89.3 kg)     Health Maintenance Due  Topic Date Due  . TETANUS/TDAP  Never done  . COVID-19 Vaccine (3 - Moderna risk 4-dose series) 10/20/2019    There are no preventive care reminders to display for this patient.  Lab Results  Component Value Date   TSH 1.440 04/28/2019   Lab Results  Component Value Date   WBC 6.9 06/14/2019   HGB 12.9 06/14/2019   HCT 39.2 06/14/2019   MCV 80.5 06/14/2019   PLT 338 06/14/2019    Lab Results  Component Value Date   NA 139 11/03/2019   K 4.1 11/03/2019   CO2 25 11/03/2019   GLUCOSE 310 (H) 11/03/2019   BUN 12 11/03/2019   CREATININE 0.66 11/03/2019   BILITOT <0.2 11/03/2019   ALKPHOS 165 (H) 11/03/2019   AST 15 11/03/2019   ALT 24 11/03/2019   PROT 7.4 11/03/2019   ALBUMIN 4.0 11/03/2019   CALCIUM 9.6 11/03/2019   ANIONGAP 7 06/14/2019   Lab Results  Component Value Date   CHOL 142 09/15/2019   Lab Results  Component Value Date   HDL 42 09/15/2019   Lab Results  Component Value Date   LDLCALC 85 09/15/2019   Lab Results  Component Value Date  TRIG 75 09/15/2019   Lab Results  Component Value Date   CHOLHDL 3.4 09/15/2019   Lab Results  Component Value Date   HGBA1C 9.3 (H) 02/02/2020      Assessment & Plan:    1. Hypercholesterolemia - She will continue on current treatment regimen, low fat/cholesterol diet and exercise as tolerated. - atorvastatin (LIPITOR) 40 MG tablet; Take 1 tablet (40 mg total) by mouth daily at 6 PM.  Dispense: 90 tablet; Refill: 0  2. Hypertension, unspecified type -She will continue on current treatment regimen, DASH diet and exercise as tolerated. She was also advised to go to the ED for hematuria, hematochezia or active bleeding. - clopidogrel (PLAVIX) 75 MG tablet; Take 1 tablet (75 mg total) by mouth daily.  Dispense: 90 tablet; Refill: 0 - hydrochlorothiazide (HYDRODIURIL) 12.5 MG tablet; Take 1 tablet (12.5 mg total) by mouth daily.  Dispense: 90 tablet; Refill: 0  3. Diabetes mellitus without complication ([place]) - Her Diabetes is not under control, HgbA1c was 9.3%, her goal should be less than 7%. She was advised to check her blood glucose bid, her fasting goal should be between 80-130 mg/dl, record and bring log to follow up visit. She will continue on low carb/non concentrated sweet diet and exercise as tolerated. - HgB A1c; Future    Follow-up: Return in about 13 weeks (around 05/09/2020), or if  symptoms worsen or fail to improve.    Shalona Harbour Jerold Coombe, NP

## 2020-02-09 ENCOUNTER — Ambulatory Visit: Payer: Medicaid Other

## 2020-02-09 ENCOUNTER — Ambulatory Visit: Payer: Medicaid Other | Admitting: Licensed Clinical Social Worker

## 2020-02-15 NOTE — Progress Notes (Signed)
Due to Covid 19 pandemic a TELEVIST was used to rescreen and enroll patient back into our BCCCP program.  2 patient identifiers were used to confirm I was speaking to the correct patient.  Verbal consent and a health history was obtained.  Patient denies any breast problems.  Last pap on 11/25/18 was negative / negative.  Next pap is due in 2025.  Patient is to go directly to the [facility] on 02/16/20 @ 2:00. Risk Assessment    Risk Scores      02/16/2020 11/23/2018   Last edited by: Rico Junker, RN [place], Laddie Aquas, CMA   5-year risk: 1.1 % 1.1 %   Lifetime risk: 5.8 % 6.1 %

## 2020-02-16 ENCOUNTER — Ambulatory Visit: Payer: Self-pay | Attending: Oncology

## 2020-02-16 DIAGNOSIS — Z0000 Encounter for general adult medical examination without abnormal findings: Secondary | ICD-10-CM

## 2020-02-17 NOTE — Telephone Encounter (Signed)
I refaxed the clearance again.

## 2020-02-29 ENCOUNTER — Ambulatory Visit: Payer: Medicaid Other | Admitting: Licensed Clinical Social Worker

## 2020-03-02 ENCOUNTER — Other Ambulatory Visit: Payer: Self-pay

## 2020-03-02 ENCOUNTER — Ambulatory Visit
Admission: RE | Admit: 2020-03-02 | Discharge: 2020-03-02 | Disposition: A | Discharge status: Home / Self Care | Payer: Medicaid Other | Source: Ambulatory Visit | Attending: Oncology | Admitting: Oncology

## 2020-03-02 DIAGNOSIS — Z0000 Encounter for general adult medical examination without abnormal findings: Secondary | ICD-10-CM | POA: Insufficient documentation

## 2020-03-02 IMAGING — MG MM DIGITAL SCREENING BILAT W/ TOMO AND CAD
6 of 10 series · 6 of 30 positions shown · non-contrast
Comparison: Previous exam(s).

CLINICAL DATA: Screening.

EXAM:
DIGITAL SCREENING BILATERAL MAMMOGRAM WITH TOMOSYNTHESIS AND CAD
TECHNIQUE: Bilateral screening digital craniocaudal and mediolateral oblique
mammograms were obtained. Bilateral screening digital breast
tomosynthesis was performed. The images were evaluated with
computer-aided detection.

[R CC synth-2D]
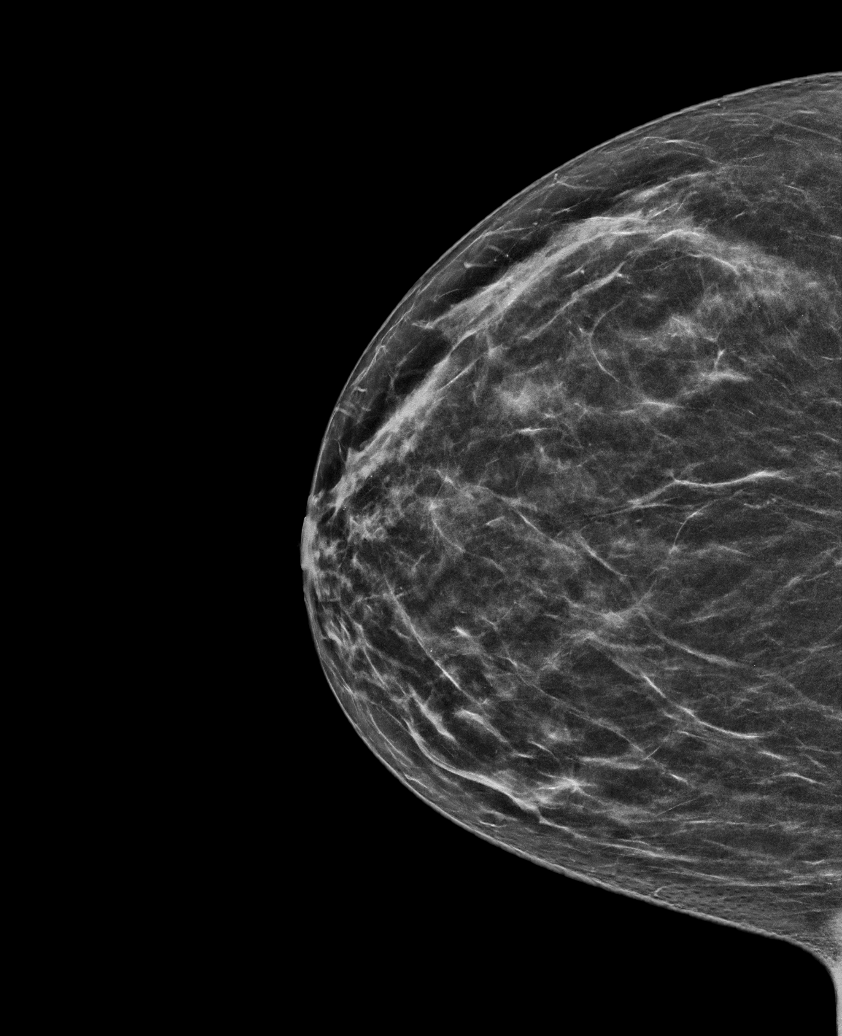

[L MLO synth-2D (1 of 2)]
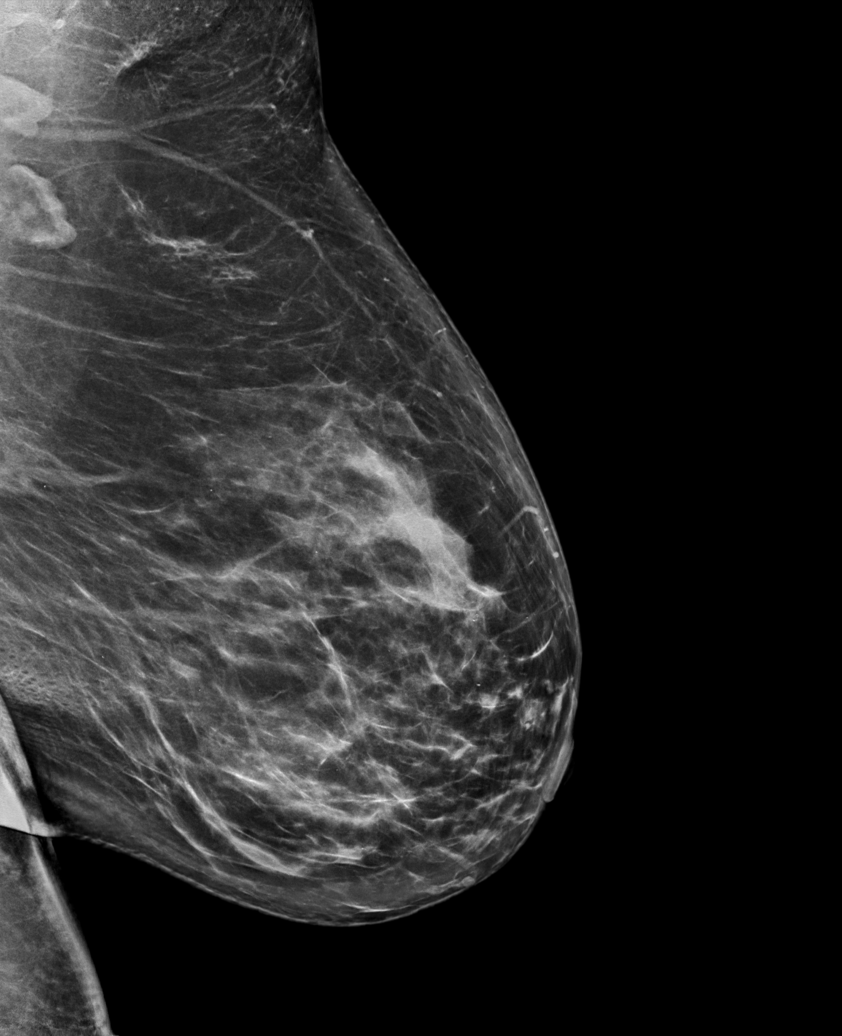

[L CC synth-2D]
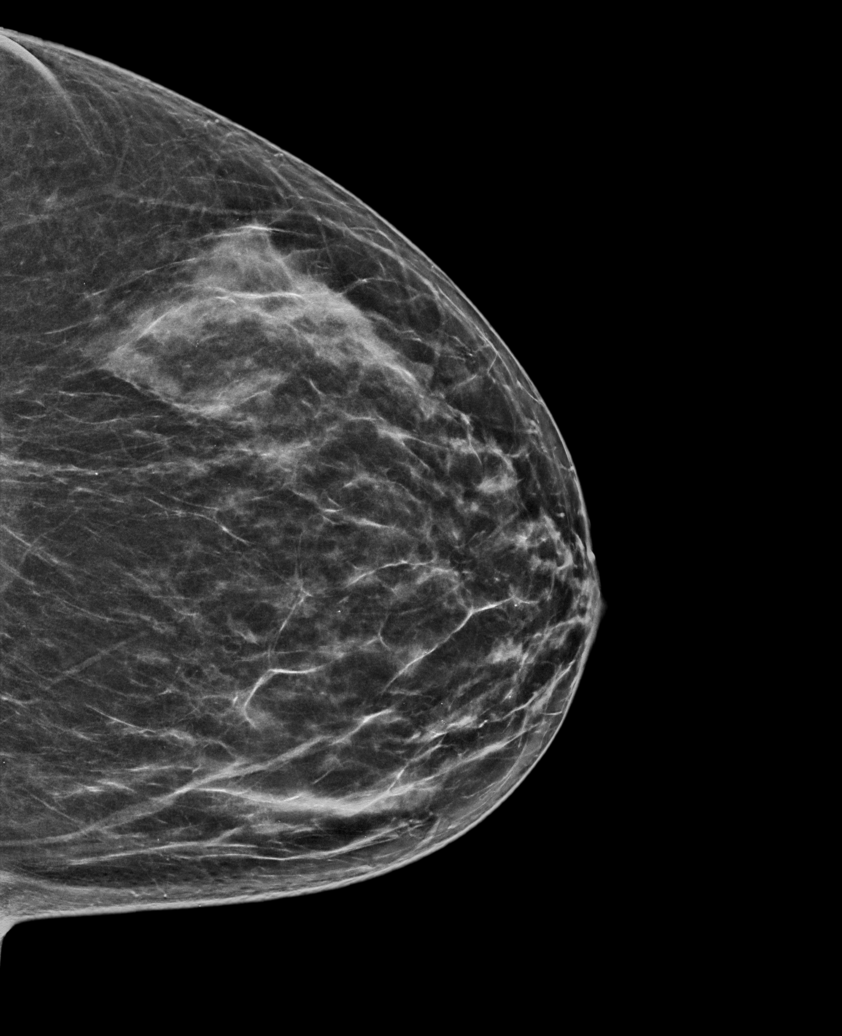

[L MLO synth-2D (2 of 2)]
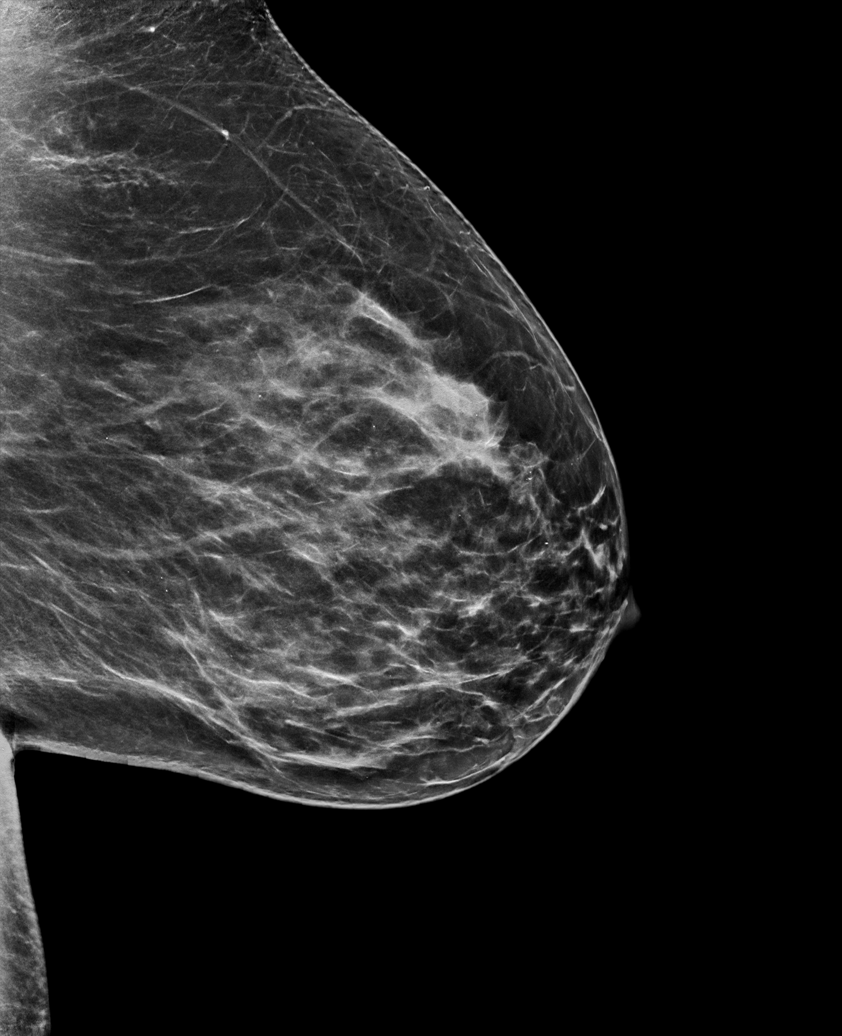

[R MLO synth-2D]
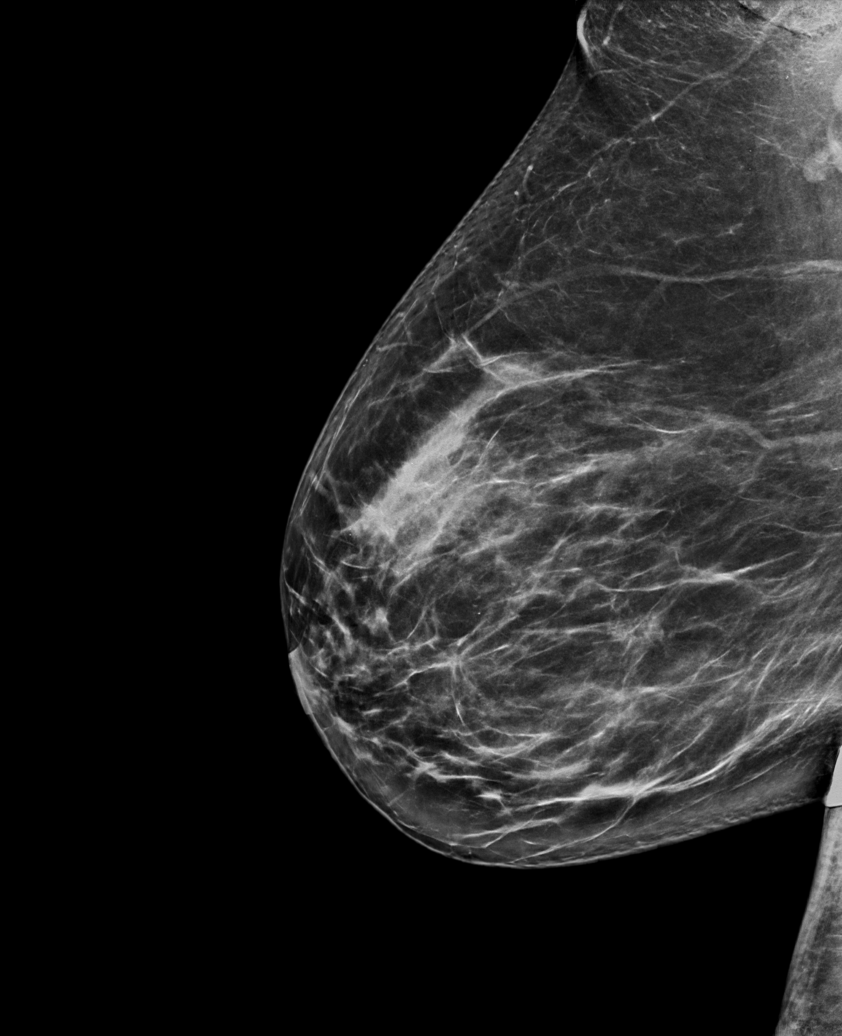

[L MLO tomo · tomo slice 37/74.0]
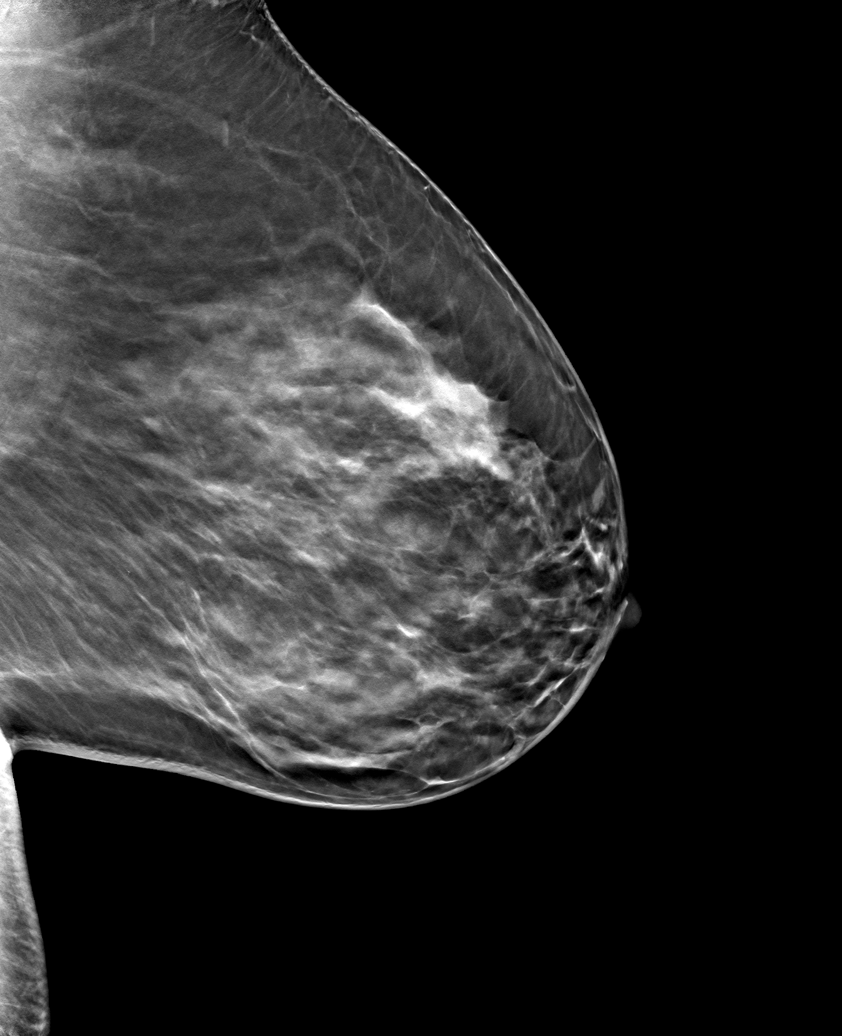

[6 of 30 positions shown; findings below may reference images not displayed]

ACR Breast Density Category c: The breast tissue is heterogeneously
dense, which may obscure small masses.
FINDINGS: There are no findings suspicious for malignancy.
IMPRESSION: No mammographic evidence of malignancy. A result letter of this
screening mammogram will be mailed directly to the patient.

RECOMMENDATION:
Screening mammogram in one year. (Code:[V2])

BI-RADS CATEGORY  1: Negative.

## 2020-03-03 ENCOUNTER — Telehealth: Payer: Self-pay | Admitting: Pharmacist

## 2020-03-03 NOTE — Telephone Encounter (Signed)
Provided 2022 proof of income. Approved to receive medication assistance at [facility] until time for re-certification in 5364, and as long as eligibility criteria continues to be met.   [place]

## 2020-03-07 ENCOUNTER — Telehealth: Payer: Self-pay | Admitting: Pharmacist

## 2020-03-07 NOTE — Telephone Encounter (Signed)
03/07/2020 1:44:42 PM - Lantus Solostar DOSE CHANGE refill to [place] - Tuesday, March 07, 2020 1:43 PM --today received [employer] refill DOSE CHANGE for Lantus Solostar Max 44 units once daily # 3 boxes from [facility]--Faxed To [employer] to process.

## 2020-03-09 NOTE — Progress Notes (Signed)
Letter mailed from [facility] to notify of normal mammogram results.  Patient to return in one year for annual screening.  Copy to [facility]. 

## 2020-03-22 ENCOUNTER — Other Ambulatory Visit: Payer: Medicaid Other

## 2020-03-29 ENCOUNTER — Ambulatory Visit: Payer: Medicaid Other | Admitting: Gerontology

## 2020-04-24 ENCOUNTER — Other Ambulatory Visit: Payer: Self-pay

## 2020-05-01 ENCOUNTER — Telehealth: Payer: Self-pay | Admitting: Pharmacist

## 2020-05-01 NOTE — Telephone Encounter (Signed)
05/01/2020 11:15:43 AM - Ozempic renewal faxed to [place] - Monday, May 01, 2020 11:14 AM --[employer] renewal for [employer] 0.5mg  once daily # 5 boxes

## 2020-05-02 ENCOUNTER — Other Ambulatory Visit: Payer: Self-pay

## 2020-05-02 ENCOUNTER — Ambulatory Visit: Payer: Self-pay | Admitting: Licensed Clinical Social Worker

## 2020-05-03 ENCOUNTER — Other Ambulatory Visit: Payer: Self-pay

## 2020-05-03 ENCOUNTER — Other Ambulatory Visit: Payer: Medicaid Other

## 2020-05-03 DIAGNOSIS — E119 Type 2 diabetes mellitus without complications: Secondary | ICD-10-CM

## 2020-05-05 ENCOUNTER — Other Ambulatory Visit: Payer: Self-pay

## 2020-05-05 LAB — HEMOGLOBIN A1C

## 2020-05-09 ENCOUNTER — Ambulatory Visit: Payer: Medicaid Other | Admitting: Gerontology

## 2020-05-11 ENCOUNTER — Telehealth: Payer: Self-pay | Admitting: Pharmacist

## 2020-05-11 NOTE — Telephone Encounter (Signed)
05/11/2020 2:58:45 PM - Lantus Solostar refill to provider  -- Angelica [name] - Thursday, May 11, 2020 2:57 PM --Sending Dr Mable Fill refill for Lantus Solostar Max 44 units once daily at bedtime # 3 to sign & return.

## 2020-05-19 ENCOUNTER — Other Ambulatory Visit: Payer: Self-pay

## 2020-05-22 ENCOUNTER — Other Ambulatory Visit: Payer: Self-pay

## 2020-05-22 MED ORDER — LANTUS SOLOSTAR 100 UNIT/ML ~~LOC~~ SOPN
50.0000 [IU] | PEN_INJECTOR | Freq: Two times a day (BID) | SUBCUTANEOUS | 10 refills | Status: DC
  Filled ????-??-??: fill #0

## 2020-05-25 ENCOUNTER — Ambulatory Visit: Payer: Medicaid Other | Admitting: Gerontology

## 2020-05-30 ENCOUNTER — Telehealth: Payer: Self-pay | Admitting: Pharmacist

## 2020-05-30 NOTE — Telephone Encounter (Signed)
05/30/2020 2:40:02 PM - Lantus Solostar refill to [place] - Tuesday, May 30, 2020 2:39 PM --Faxed [employer] refill for Lantus Solostar Inject max 44 units once daily # 3 boxes.

## 2020-06-02 ENCOUNTER — Other Ambulatory Visit: Payer: Self-pay

## 2020-06-06 ENCOUNTER — Other Ambulatory Visit: Payer: Self-pay

## 2020-06-07 ENCOUNTER — Other Ambulatory Visit: Payer: Self-pay

## 2020-06-07 MED ORDER — EMGALITY 120 MG/ML ~~LOC~~ SOAJ
SUBCUTANEOUS | 3 refills | Status: AC
Start: 2020-05-17 — End: ?
  Filled 2020-06-21: qty 4, 120d supply, fill #0
  Filled 2020-09-19: qty 4, 120d supply, fill #1

## 2020-06-08 ENCOUNTER — Other Ambulatory Visit: Payer: Self-pay

## 2020-06-09 ENCOUNTER — Other Ambulatory Visit: Payer: Self-pay

## 2020-06-15 ENCOUNTER — Ambulatory Visit: Payer: Medicaid Other | Admitting: Gerontology

## 2020-06-15 ENCOUNTER — Other Ambulatory Visit: Payer: Self-pay

## 2020-06-15 ENCOUNTER — Encounter: Payer: Self-pay | Admitting: Gerontology

## 2020-06-15 VITALS — BP 124/84 | HR 77 | Temp 97.2°F | Resp 16 | Ht 63.0 in | Wt 197.3 lb

## 2020-06-15 DIAGNOSIS — E119 Type 2 diabetes mellitus without complications: Secondary | ICD-10-CM

## 2020-06-15 DIAGNOSIS — I10 Essential (primary) hypertension: Secondary | ICD-10-CM

## 2020-06-15 DIAGNOSIS — E7800 Pure hypercholesterolemia, unspecified: Secondary | ICD-10-CM

## 2020-06-15 DIAGNOSIS — K219 Gastro-esophageal reflux disease without esophagitis: Secondary | ICD-10-CM

## 2020-06-15 DIAGNOSIS — N898 Other specified noninflammatory disorders of vagina: Secondary | ICD-10-CM | POA: Insufficient documentation

## 2020-06-15 LAB — GLUCOSE, POCT (MANUAL RESULT ENTRY): POC Glucose: 184 mg/dl — AB (ref 70–99)

## 2020-06-15 MED ORDER — SEMAGLUTIDE(0.25 OR 0.5MG/DOS) 2 MG/1.5ML ~~LOC~~ SOPN
PEN_INJECTOR | SUBCUTANEOUS | 3 refills | Status: AC
  Filled 2020-06-15: qty 1.5, fill #0
  Filled 2020-07-04: qty 1.5, 28d supply, fill #0
  Filled 2020-08-16: qty 4.5, 84d supply, fill #0
  Filled 2020-08-16: qty 1.5, 30d supply, fill #0
  Filled ????-??-??: fill #0

## 2020-06-15 MED ORDER — HYDROCHLOROTHIAZIDE 12.5 MG PO CAPS
ORAL_CAPSULE | Freq: Every day | ORAL | 0 refills | Status: AC
  Filled 2020-06-15: qty 90, 90d supply, fill #0

## 2020-06-15 MED ORDER — CLOPIDOGREL BISULFATE 75 MG PO TABS
ORAL_TABLET | Freq: Every day | ORAL | 0 refills | Status: AC
  Filled 2020-06-15: qty 90, 90d supply, fill #0

## 2020-06-15 MED ORDER — PANTOPRAZOLE SODIUM 40 MG PO TBEC
40.0000 mg | DELAYED_RELEASE_TABLET | Freq: Two times a day (BID) | ORAL | 2 refills | Status: AC | PRN
  Filled 2020-06-15: qty 60, 30d supply, fill #0

## 2020-06-15 MED ORDER — ATORVASTATIN CALCIUM 40 MG PO TABS
ORAL_TABLET | ORAL | 0 refills | Status: AC
  Filled 2020-06-15: qty 90, 90d supply, fill #0

## 2020-06-15 MED ORDER — INSULIN GLARGINE 100 UNIT/ML SOLOSTAR PEN
PEN_INJECTOR | SUBCUTANEOUS | 3 refills | Status: DC
  Filled 2020-06-15: qty 30, fill #0
  Filled 2020-06-15: qty 45, 113d supply, fill #0

## 2020-06-15 NOTE — Progress Notes (Signed)
hgb  Established Patient Office Visit  Subjective:  Patient ID: Angelica [name], female    DOB: 03/05/1960  Age: 60 y.o. MRN: [id number]  CC:  Chief Complaint  Patient presents with  . Follow-up  . Hypertension  . Diabetes  . Vaginal Discharge    X 6 days. Patient also states that she has a "bump" around her vaginal area that hurts when she wipes.  . Medication Refill    HPI Angelica [name] is a 60 y/o female who has history of Bell Palsy, GERD, Hypertension, T2DM who presents for follow up visit and medication refill. She missed her lab appointment and her last HgbA1c done 4 months ago was 9.3%. She states that she has not been checking her blood glucose because her glucometer was not working. She denies hypo/hyperglycemic symptoms, compliant with her medications and adheres to ADA diet. Her blood glucose was 184 mg/dl when checked during visit. She also c/o vaginal discharge, itching and a bump that has been going on for 6 days. She doesn't know the color of the discharge. She's sexually active with one partner, but doesn't use protection. She denies fever, chills, pelvic and flank pain. Overall, she states that she's doing well and offers no further complaint.  Past Medical History:  Diagnosis Date  . Bell palsy 08/04/2014  . GERD (gastroesophageal reflux disease) 01/27/2015  . History of Bell's palsy June 2016  . Hyperlipidemia 01/27/2015  . Hypertension 01/27/2015  . Type 2 diabetes mellitus ([place]) 08/04/2014    Past Surgical History:  Procedure Laterality Date  . ABDOMINAL HYSTERECTOMY    . COLONOSCOPY WITH PROPOFOL N/A 10/07/2018   Procedure: COLONOSCOPY WITH PROPOFOL;  Surgeon: Virgel Manifold, MD;  Location: [facility] ENDOSCOPY;  Service: Gastroenterology;  Laterality: N/A;  . OOPHORECTOMY Right 2005    Family History  Problem Relation Age of Onset  . Cancer Brother        Colon Cancer  . Other Mother        Covid  . Hypertension Mother   . Hypertension Father   . Kidney  disease Sister   . Diabetes Sister   . Heart attack Brother 49  . Dementia Sister   . Breast cancer Neg Hx     Social History   Socioeconomic History  . Marital status: Single    Spouse name: Not on file  . Number of children: 2  . Years of education: Not on file  . Highest education level: 11th grade  Occupational History  . Occupation: unemployed  Tobacco Use  . Smoking status: Never Smoker  . Smokeless tobacco: Never Used  Vaping Use  . Vaping Use: Never used  Substance and Sexual Activity  . Alcohol use: Yes    Comment: "maybe 2 drinks a month"  . Drug use: Not Currently    Types: Cocaine    Comment: last use 2009  . Sexual activity: Yes    Birth control/protection: Post-menopausal  Other Topics Concern  . 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 Not employed      On Food stamps   Social Determinants of Health   Financial Resource Strain: High Risk  . Difficulty of Paying Living Expenses: Very hard  Food Insecurity: No Food Insecurity  . Worried About Charity fundraiser in the Last Year: Never true  . Ran Out of Food in the Last Year: Never true  Transportation Needs: No  Transportation Needs  . Lack of Transportation (Medical): No  . Lack of Transportation (Non-Medical): No  Physical Activity: Insufficiently Active  . Days of Exercise per Week: 3 days  . Minutes of Exercise per Session: 10 min  Stress: Stress Concern Present  . Feeling of Stress : Very much  Social Connections: Moderately Isolated  . Frequency of Communication with Friends and Family: More than three times a week  . Frequency of Social Gatherings with Friends and Family: More than three times a week  . Attends Religious Services: More than 4 times per year  . Active Member of Clubs or Organizations: No  . Attends Archivist Meetings: Never  . Marital Status: Never married  Intimate Partner Violence: Not on file     Outpatient Medications Prior to Visit  Medication Sig Dispense Refill  . COMFORT EZ PEN NEEDLES 32G X 4 MM MISC USE TO TEST BLOOD GLUCOSE UP TO 4 TIMES PER DAY, IN THE MORNING AND AFTER MEALS 100 each 11  . Galcanezumab-gnlm (EMGALITY) 120 MG/ML SOAJ Inject 154m (136m subcutaneously once every 30 days. 5 mL 3  . metFORMIN (GLUCOPHAGE-XR) 500 MG 24 hr tablet TAKE 2 TABLETS(1,000MG TOTAL) BY MOUTH TWO TIMES DAILY WITH BREAKFAST AND BEFORE SUPPER 180 tablet 2  . atorvastatin (LIPITOR) 40 MG tablet TAKE ONE TABLET BY MOUTH EVERY DAY AT 6PM 90 tablet 0  . clopidogrel (PLAVIX) 75 MG tablet TAKE ONE TABLET BY MOUTH EVERY DAY 90 tablet 0  . hydrochlorothiazide (MICROZIDE) 12.5 MG capsule TAKE ONE CAPSULE BY MOUTH EVERY DAY 90 capsule 0  . insulin glargine (LANTUS) 100 UNIT/ML Solostar Pen INJECT 40 UNITS UNDER THE SKIN AT BEDTIME. INCREASE BY 4 UNITS EVERY 3 DAYS IF FASTING BLOOD GLUCOSE IS> 180 MG/DL 30 mL 3  . pantoprazole (PROTONIX) 40 MG tablet Take 40 mg by mouth 2 (two) times daily as needed.    . Semaglutide,0.25 or 0.5MG/DOS, 2 MG/1.5ML SOPN INJECT .5MG INTO THE SKIN ONCE A WEEK 1.5 mL 3  . Rimegepant Sulfate 75 MG TBDP Take 75 mg by mouth daily. As needed - do not exceed more than 1 dose in 24 hours (Patient not taking: Reported on 06/15/2020)    . baclofen (LIORESAL) 10 MG tablet Take 10 mg by mouth 2 (two) times daily. As needed    . Galcanezumab-gnlm (EMGALITY) 120 MG/ML SOAJ Inject into the skin.    . hydrochlorothiazide (HYDRODIURIL) 12.5 MG tablet Take 1 tablet (12.5 mg total) by mouth daily. 90 tablet 0  . metFORMIN (GLUMETZA) 1000 MG (MOD) 24 hr tablet Take 1 tablet (1,000 mg total) by mouth daily with breakfast AND 1 tablet (1,000 mg total) daily before supper. 90 tablet 2  . nortriptyline (PAMELOR) 25 MG capsule Take 1 capsule (25 mg total) by mouth at bedtime. (Patient not taking: Reported on 02/08/2020) 30 capsule 0  . tiZANidine (ZANAFLEX) 2 MG tablet TAKE ONE TABLET BY MOUTH AT  NIGHT FOR 2 DAYS. THEN 1 TABLET 2 TIMES A DAY FOR 2 DAYS. THEN 1 TABLET3 TIMES A DAY FOR 26 DAYS 84 tablet 1   No facility-administered medications prior to visit.    No Known Allergies  ROS Review of Systems  Constitutional: Negative.   Eyes: Negative.   Respiratory: Negative.   Cardiovascular: Negative.   Endocrine: Negative.   Genitourinary: Positive for vaginal discharge. Negative for dysuria, flank pain, frequency, pelvic pain and urgency.  Skin: Negative.   Neurological: Negative.   Hematological: Negative.   Psychiatric/Behavioral: Negative.  Objective:    Physical Exam HENT:     Head: Normocephalic and atraumatic.  Eyes:     Extraocular Movements: Extraocular movements intact.     Conjunctiva/sclera: Conjunctivae normal.     Pupils: Pupils are equal, round, and reactive to light.  Cardiovascular:     Rate and Rhythm: Normal rate and regular rhythm.     Pulses: Normal pulses.     Heart sounds: Normal heart sounds.  Pulmonary:     Effort: Pulmonary effort is normal.     Breath sounds: Normal breath sounds.  Abdominal:     General: Bowel sounds are normal.     Palpations: Abdomen is soft.     Tenderness: There is no right CVA tenderness or left CVA tenderness.  Skin:    General: Skin is warm.  Neurological:     General: No focal deficit present.     Mental Status: She is alert and oriented to person, place, and time. Mental status is at baseline.  Psychiatric:        Mood and Affect: Mood normal.        Behavior: Behavior normal.        Thought Content: Thought content normal.        Judgment: Judgment normal.     BP 124/84 (BP Location: Right Arm, Patient Position: Sitting, Cuff Size: Large)   Pulse 77   Temp (!) 97.2 F (36.2 C)   Resp 16   Ht 5' 3" (1.6 m)   Wt 197 lb 4.8 oz (89.5 kg)   LMP 03/11/2013 (Within Months)   SpO2 97%   BMI 34.95 kg/m  Wt Readings from Last 3 Encounters:  06/15/20 197 lb 4.8 oz (89.5 kg)  05/03/20 200 lb 8 oz  (90.9 kg)  02/02/20 195 lb (88.5 kg)   Weight loss encouraged.  Health Maintenance Due  Topic Date Due  . TETANUS/TDAP  Never done  . Zoster Vaccines- Shingrix (1 of 2) Never done  . COVID-19 Vaccine (3 - Moderna risk 4-dose series) 10/20/2019  . OPHTHALMOLOGY EXAM  05/31/2020    There are no preventive care reminders to display for this patient.  Lab Results  Component Value Date   TSH 1.440 04/28/2019   Lab Results  Component Value Date   WBC 6.9 06/14/2019   HGB 12.9 06/14/2019   HCT 39.2 06/14/2019   MCV 80.5 06/14/2019   PLT 338 06/14/2019   Lab Results  Component Value Date   NA 139 11/03/2019   K 4.1 11/03/2019   CO2 25 11/03/2019   GLUCOSE 310 (H) 11/03/2019   BUN 12 11/03/2019   CREATININE 0.66 11/03/2019   BILITOT <0.2 11/03/2019   ALKPHOS 165 (H) 11/03/2019   AST 15 11/03/2019   ALT 24 11/03/2019   PROT 7.4 11/03/2019   ALBUMIN 4.0 11/03/2019   CALCIUM 9.6 11/03/2019   ANIONGAP 7 06/14/2019   EGFR 111 10/30/2017   Lab Results  Component Value Date   CHOL 142 09/15/2019   Lab Results  Component Value Date   HDL 42 09/15/2019   Lab Results  Component Value Date   LDLCALC 85 09/15/2019   Lab Results  Component Value Date   TRIG 75 09/15/2019   Lab Results  Component Value Date   CHOLHDL 3.4 09/15/2019   Lab Results  Component Value Date   HGBA1C CANCELED 05/03/2020      Assessment & Plan:     1. Hypercholesterolemia -She will continue on current medication , low  fat/cholesterol diet and exercise as tolerated. - atorvastatin (LIPITOR) 40 MG tablet; TAKE ONE TABLET BY MOUTH EVERY DAY AT 6PM  Dispense: 90 tablet; Refill: 0  2. Hypertension, unspecified type - Her blood pressure is under control, she will continue on current medication, DASH diet and exercise as tolerated. - clopidogrel (PLAVIX) 75 MG tablet; TAKE ONE TABLET BY MOUTH EVERY DAY  Dispense: 90 tablet; Refill: 0 - hydrochlorothiazide (MICROZIDE) 12.5 MG capsule; TAKE  ONE CAPSULE BY MOUTH EVERY DAY  Dispense: 90 capsule; Refill: 0  3. Diabetes mellitus without complication ([place]) - Unable to check HgbA1c during visit, but will collect blood for HgbA1c after visit. She will continue on current medication pending outcome of lab result. She was encouraged to continue on low carb/ non concentrated sweet diet and exercise as tolerated. She was provided with new glucometer, test stripes and lancets during check out. - insulin glargine (LANTUS) 100 UNIT/ML Solostar Pen; INJECT 40 UNITS UNDER THE SKIN AT BEDTIME. INCREASE BY 4 UNITS EVERY 3 DAYS IF FASTING BLOOD GLUCOSE IS> 180 MG/DL  Dispense: 30 mL; Refill: 3 - Semaglutide,0.25 or 0.5MG/DOS, 2 MG/1.5ML SOPN; INJECT 0.5MG INTO THE SKIN ONCE A WEEK.  Dispense: 1.5 mL; Refill: 3 - HgB A1c; Future - POCT Glucose (CBG); Future - HgB A1c; Future - POCT Glucose (CBG) - HgB A1c - HgB A1c  4. Gastroesophageal reflux disease without esophagitis - Her acid reflux is under control and she will continue on current medication. -Avoid spicy, fatty and fried food -Avoid sodas and sour juices -Avoid heavy meals -Avoid eating 4 hours before bedtime -Elevate head of bed at night - pantoprazole (PROTONIX) 40 MG tablet; Take 1 tablet (40 mg total) by mouth 2 (two) times daily as needed.  Dispense: 60 tablet; Refill: 2  5. Vaginal discharge -Urine sample will be collected, unable to perform vaginal swab, advised to go to [place] for Vaginal swab test. - UA/M w/rflx Culture, Routine; Future - UA/M w/rflx Culture, Routine   Follow-up: Return in about 3 months (around 09/20/2020), or if symptoms worsen or fail to improve.    [place] Jerold Coombe, NP

## 2020-06-15 NOTE — Patient Instructions (Signed)
Cooking With Less Salt Cooking with less salt is one way to reduce the amount of sodium you get from food. Sodium is one of the elements that make up salt. It is found naturally in foods and is also added to certain foods. Depending on your condition and overall health, your health care provider or dietitian may recommend that you reduce your sodium intake. Most people should have less than 2,300 milligrams (mg) of sodium each day. If you have high blood pressure (hypertension), you may need to limit your sodium to 1,500 mg each day. Follow the tips below to help reduce your sodium intake. What are tips for eating less sodium? Reading food labels  Check the food label before buying or using packaged ingredients. Always check the label for the serving size and sodium content.  Look for products with no more than 140 mg of sodium in one serving.  Check the % Daily Value column to see what percent of the daily recommended amount of sodium is provided in one serving of the product. Foods with 5% or less in this column are considered low in sodium. Foods with 20% or higher are considered high in sodium.  Do not choose foods with salt as one of the first three ingredients on the ingredients list. If salt is one of the first three ingredients, it usually means the item is high in sodium.   Shopping  Buy sodium-free or low-sodium products. Look for the following words on food labels: ? Low-sodium. ? Sodium-free. ? Reduced-sodium. ? No salt added. ? Unsalted.  Always check the sodium content even if foods are labeled as low-sodium or no salt added.  Buy fresh foods. Cooking  Use herbs, seasonings without salt, and spices as substitutes for salt.  Use sodium-free baking soda when baking.  Grill, braise, or roast foods to add flavor with less salt.  Avoid adding salt to pasta, rice, or hot cereals.  Drain and rinse canned vegetables, beans, and meat before use.  Avoid adding salt when  cooking sweets and desserts.  Cook with low-sodium ingredients. What foods are high in sodium? Vegetables Regular canned vegetables (not low-sodium or reduced-sodium). Sauerkraut, pickled vegetables, and relishes. Olives. French fries. Onion rings. Regular canned tomato sauce and paste. Regular tomato and vegetable juice. Frozen vegetables in sauces. Grains Instant hot cereals. Bread stuffing, pancake, and biscuit mixes. Croutons. Seasoned rice or pasta mixes. Noodle soup cups. Boxed or frozen macaroni and cheese. Regular salted crackers. Self-rising flour. Rolls. Bagels. Flour tortillas and wraps. Meats and other proteins Meat or fish that is salted, canned, smoked, cured, spiced, or pickled. This includes bacon, ham, sausages, hot dogs, corned beef, chipped beef, meat loaves, salt pork, jerky, pickled herring, anchovies, regular canned tuna, and sardines. Salted nuts. Dairy Processed cheese and cheese spreads. Cheese curds. Blue cheese. Feta cheese. String cheese. Regular cottage cheese. Buttermilk. Canned milk. The items listed above may not be a complete list of foods high in sodium. Actual amounts of sodium may be different depending on processing. Contact a dietitian for more information. What foods are low in sodium? Fruits Fresh, frozen, or canned fruit with no sauce added. Fruit juice. Vegetables Fresh or frozen vegetables with no sauce added. "No salt added" canned vegetables. "No salt added" tomato sauce and paste. Low-sodium or reduced-sodium tomato and vegetable juice. Grains Noodles, pasta, quinoa, rice. Shredded or puffed wheat or puffed rice. Regular or quick oats (not instant). Low-sodium crackers. Low-sodium bread. Whole-grain bread and whole-grain pasta. Unsalted popcorn.   Meats and other proteins Fresh or frozen whole meats, poultry (not injected with sodium), and fish with no sauce added. Unsalted nuts. Dried peas, beans, and lentils without added salt. Unsalted canned beans.  Eggs. Unsalted nut butters. Low-sodium canned tuna or chicken. Dairy Milk. Soy milk. Yogurt. Low-sodium cheeses, such as Swiss, Monterey Jack, [place], and [employer]. Sherbet or ice cream (keep to  cup per serving). Cream cheese. Fats and oils Unsalted butter or margarine. Other foods Homemade pudding. Sodium-free baking soda and baking powder. Herbs and spices. Low-sodium seasoning mixes. Beverages Coffee and tea. Carbonated beverages. The items listed above may not be a complete list of foods low in sodium. Actual amounts of sodium may be different depending on processing. Contact a dietitian for more information. What are some salt alternatives when cooking? The following are herbs, seasonings, and spices that can be used instead of salt to flavor your food. Herbs should be fresh or dried. Do not choose packaged mixes. Next to the name of the herb, spice, or seasoning are some examples of foods you can pair it with. Herbs  Bay leaves - Soups, meat and vegetable dishes, and spaghetti sauce.  Basil - [employer], soups, pasta, and fish dishes.  Cilantro - Meat, poultry, and vegetable dishes.  Chili powder - Marinades and Mexican dishes.  Chives - Salad dressings and potato dishes.  Cumin - Mexican dishes, couscous, and meat dishes.  Dill - Fish dishes, sauces, and salads.  Fennel - Meat and vegetable dishes, breads, and cookies.  Garlic (do not use garlic salt) - [place] dishes, meat dishes, salad dressings, and sauces.  Marjoram - Soups, potato dishes, and meat dishes.  Oregano - Pizza and spaghetti sauce.  Parsley - Salads, soups, pasta, and meat dishes.  Rosemary - [place] dishes, salad dressings, soups, and red meats.  Saffron - Fish dishes, pasta, and some poultry dishes.  Sage - Stuffings and sauces.  Tarragon - Fish and [employer].  Thyme - Stuffing, meat, and fish dishes. Seasonings  Lemon juice - Fish dishes, poultry dishes, vegetables, and  salads.  Vinegar - Salad dressings, vegetables, and fish dishes. Spices  Cinnamon - Sweet dishes, such as cakes, cookies, and puddings.  Cloves - Gingerbread, puddings, and marinades for meats.  Curry - Vegetable dishes, fish and poultry dishes, and stir-fry dishes.  Ginger - Vegetable dishes, fish dishes, and stir-fry dishes.  Nutmeg - Pasta, vegetables, poultry, fish dishes, and custard. Summary  Cooking with less salt is one way to reduce the amount of sodium that you get from food.  Buy sodium-free or low-sodium products.  Check the food label before using or buying packaged ingredients.  Use herbs, seasonings without salt, and spices as substitutes for salt in foods. This information is not intended to replace advice given to you by your health care provider. Make sure you discuss any questions you have with your health care provider. Document Revised: 12/30/2018 Document Reviewed: 12/30/2018 Elsevier Patient Education  2021 [place]. https://www.diabeteseducator.org/docs/default-source/living-with-diabetes/conquering-the-grocery-store-v1.pdf?sfvrsn=4">  Carbohydrate Counting for Diabetes Mellitus, Adult Carbohydrate counting is a method of keeping track of how many carbohydrates you eat. Eating carbohydrates naturally increases the amount of sugar (glucose) in the blood. Counting how many carbohydrates you eat improves your blood glucose control, which helps you manage your diabetes. It is important to know how many carbohydrates you can safely have in each meal. This is different for every person. A dietitian can help you make a meal plan and calculate how many carbohydrates you should have  at each meal and snack. What foods contain carbohydrates? Carbohydrates are found in the following foods:  Grains, such as breads and cereals.  Dried beans and soy products.  Starchy vegetables, such as potatoes, peas, and corn.  Fruit and fruit juices.  Milk and  yogurt.  Sweets and snack foods, such as cake, cookies, candy, chips, and soft drinks.   How do I count carbohydrates in foods? There are two ways to count carbohydrates in food. You can read food labels or learn standard serving sizes of foods. You can use either of the methods or a combination of both. Using the Nutrition Facts label The Nutrition Facts list is included on the labels of almost all packaged foods and beverages in the [place] It includes:  The serving size.  Information about nutrients in each serving, including the grams (g) of carbohydrate per serving. To use the Nutrition Facts:  Decide how many servings you will have.  Multiply the number of servings by the number of carbohydrates per serving.  The resulting number is the total amount of carbohydrates that you will be having. Learning the standard serving sizes of foods When you eat carbohydrate foods that are not packaged or do not include Nutrition Facts on the label, you need to measure the servings in order to count the amount of carbohydrates.  Measure the foods that you will eat with a food scale or measuring cup, if needed.  Decide how many standard-size servings you will eat.  Multiply the number of servings by 15. For foods that contain carbohydrates, one serving equals 15 g of carbohydrates. ? For example, if you eat 2 cups or 10 oz (300 g) of strawberries, you will have eaten 2 servings and 30 g of carbohydrates (2 servings x 15 g = 30 g).  For foods that have more than one food mixed, such as soups and casseroles, you must count the carbohydrates in each food that is included. The following list contains standard serving sizes of common carbohydrate-rich foods. Each of these servings has about 15 g of carbohydrates:  1 slice of bread.  1 six-inch (15 cm) tortilla.  ? cup or 2 oz (53 g) cooked rice or pasta.   cup or 3 oz (85 g) cooked or canned, drained and rinsed beans or lentils.   cup or 3 oz  (85 g) starchy vegetable, such as peas, corn, or squash.   cup or 4 oz (120 g) hot cereal.   cup or 3 oz (85 g) boiled or mashed potatoes, or  or 3 oz (85 g) of a large baked potato.   cup or 4 fl oz (118 mL) fruit juice.  1 cup or 8 fl oz (237 mL) milk.  1 small or 4 oz (106 g) apple.   or 2 oz (63 g) of a medium banana.  1 cup or 5 oz (150 g) strawberries.  3 cups or 1 oz (24 g) popped popcorn. What is an example of carbohydrate counting? To calculate the number of carbohydrates in this sample meal, follow the steps shown below. Sample meal  3 oz (85 g) chicken breast.  ? cup or 4 oz (106 g) brown rice.   cup or 3 oz (85 g) corn.  1 cup or 8 fl oz (237 mL) milk.  1 cup or 5 oz (150 g) strawberries with sugar-free whipped topping. Carbohydrate calculation 1. Identify the foods that contain carbohydrates: ? Rice. ? Corn. ? Milk. ? Strawberries. 2. Calculate how  many servings you have of each food: ? 2 servings rice. ? 1 serving corn. ? 1 serving milk. ? 1 serving strawberries. 3. Multiply each number of servings by 15 g: ? 2 servings rice x 15 g = 30 g. ? 1 serving corn x 15 g = 15 g. ? 1 serving milk x 15 g = 15 g. ? 1 serving strawberries x 15 g = 15 g. 4. Add together all of the amounts to find the total grams of carbohydrates eaten: ? 30 g + 15 g + 15 g + 15 g = 75 g of carbohydrates total. What are tips for following this plan? Shopping  Develop a meal plan and then make a shopping list.  Buy fresh and frozen vegetables, fresh and frozen fruit, dairy, eggs, beans, lentils, and whole grains.  Look at food labels. Choose foods that have more fiber and less sugar.  Avoid processed foods and foods with added sugars. Meal planning  Aim to have the same amount of carbohydrates at each meal and for each snack time.  Plan to have regular, balanced meals and snacks. Where to find more information  American Diabetes Association:  www.diabetes.org  Centers for Disease Control and Prevention: http://www.wolf.info/ Summary  Carbohydrate counting is a method of keeping track of how many carbohydrates you eat.  Eating carbohydrates naturally increases the amount of sugar (glucose) in the blood.  Counting how many carbohydrates you eat improves your blood glucose control, which helps you manage your diabetes.  A dietitian can help you make a meal plan and calculate how many carbohydrates you should have at each meal and snack. This information is not intended to replace advice given to you by your health care provider. Make sure you discuss any questions you have with your health care provider. Document Revised: 01/07/2019 Document Reviewed: 01/08/2019 Elsevier Patient Education  2021 [employer].

## 2020-06-16 ENCOUNTER — Ambulatory Visit: Payer: Medicaid Other

## 2020-06-19 LAB — MICROSCOPIC EXAMINATION: Casts: NONE SEEN /lpf

## 2020-06-19 LAB — UA/M W/RFLX CULTURE, ROUTINE
Bilirubin, UA: NEGATIVE
Glucose, UA: NEGATIVE
Ketones, UA: NEGATIVE
Nitrite, UA: NEGATIVE
Protein,UA: NEGATIVE
RBC, UA: NEGATIVE
Specific Gravity, UA: 1.017 (ref 1.005–1.030)
Urobilinogen, Ur: 0.2 mg/dL (ref 0.2–1.0)
pH, UA: 5.5 (ref 5.0–7.5)

## 2020-06-19 LAB — URINE CULTURE, REFLEX

## 2020-06-19 LAB — HEMOGLOBIN A1C
Est. average glucose Bld gHb Est-mCnc: 235 mg/dL
Hgb A1c MFr Bld: 9.8 % — ABNORMAL HIGH (ref 4.8–5.6)

## 2020-06-20 ENCOUNTER — Other Ambulatory Visit: Payer: Self-pay

## 2020-06-20 ENCOUNTER — Other Ambulatory Visit: Payer: Self-pay | Admitting: Gerontology

## 2020-06-20 DIAGNOSIS — E119 Type 2 diabetes mellitus without complications: Secondary | ICD-10-CM

## 2020-06-20 DIAGNOSIS — N898 Other specified noninflammatory disorders of vagina: Secondary | ICD-10-CM

## 2020-06-20 MED ORDER — INSULIN GLARGINE 100 UNIT/ML SOLOSTAR PEN
43.0000 [IU] | PEN_INJECTOR | Freq: Every day | SUBCUTANEOUS | 3 refills | Status: AC
  Filled 2020-06-20: qty 30, 70d supply, fill #0
  Filled ????-??-??: fill #1

## 2020-06-20 MED ORDER — MICONAZOLE NITRATE 2 % VA CREA
1.0000 | TOPICAL_CREAM | Freq: Every day | VAGINAL | 0 refills | Status: AC
  Filled 2020-06-20: qty 45, 7d supply, fill #0

## 2020-06-20 NOTE — Progress Notes (Signed)
Angelica [name] was notified that her HgbA1c was 9.8%, her Lantus was increased to 43 units at bedtime. She was unable to have vaginal swab at [facility] for discharge, will treat her prophylactically with Monistat. She was advised on tighter glycemic control, continue on low carb/non concentrated sweet diet and proper perineal hygiene.

## 2020-06-21 ENCOUNTER — Other Ambulatory Visit: Payer: Self-pay

## 2020-06-22 ENCOUNTER — Other Ambulatory Visit: Payer: Self-pay

## 2020-06-23 NOTE — Telephone Encounter (Signed)
This is my fourth attempt with the blood thinner clearance. Hopefully we get a response so we could schedule her the procedure.

## 2020-06-27 ENCOUNTER — Other Ambulatory Visit: Payer: Self-pay

## 2020-06-27 MED FILL — Insulin Pen Needle 32 G X 4 MM (1/6" or 5/32"): 25 days supply | Qty: 100 | Fill #0 | Status: AC

## 2020-07-03 ENCOUNTER — Other Ambulatory Visit: Payer: Self-pay

## 2020-07-04 ENCOUNTER — Other Ambulatory Visit: Payer: Self-pay

## 2020-07-19 ENCOUNTER — Other Ambulatory Visit: Payer: Self-pay

## 2020-07-19 DIAGNOSIS — E119 Type 2 diabetes mellitus without complications: Secondary | ICD-10-CM | POA: Diagnosis not present

## 2020-07-19 MED ORDER — OZEMPIC (1 MG/DOSE) 2 MG/1.5ML ~~LOC~~ SOPN: PEN_INJECTOR | SUBCUTANEOUS | 11 refills | Status: AC

## 2020-07-28 ENCOUNTER — Other Ambulatory Visit: Payer: Self-pay

## 2020-08-01 ENCOUNTER — Other Ambulatory Visit: Payer: Self-pay

## 2020-08-02 ENCOUNTER — Other Ambulatory Visit: Payer: Self-pay

## 2020-08-09 ENCOUNTER — Other Ambulatory Visit: Payer: Self-pay

## 2020-08-10 ENCOUNTER — Other Ambulatory Visit: Payer: Self-pay

## 2020-08-16 ENCOUNTER — Other Ambulatory Visit: Payer: Self-pay

## 2020-08-16 ENCOUNTER — Telehealth: Payer: Self-pay

## 2020-08-16 NOTE — Telephone Encounter (Signed)
Social worker called patient and scheduled her an appointment with the [facility].

## 2020-08-17 ENCOUNTER — Other Ambulatory Visit: Payer: Self-pay

## 2020-08-18 ENCOUNTER — Telehealth: Payer: Self-pay | Admitting: Pharmacist

## 2020-08-18 NOTE — Telephone Encounter (Signed)
08/18/2020 12:21:08 PM - Lantus renewal to pat. & dr  -- Elmer Picker - Friday, August 18, 2020 12:20 PM --Lantus Solostar Mailing [employer] renewal to patient and Dr. Mable Fill to sign & return.

## 2020-08-30 ENCOUNTER — Other Ambulatory Visit: Payer: Self-pay

## 2020-08-30 ENCOUNTER — Ambulatory Visit: Payer: Medicaid Other | Admitting: Licensed Clinical Social Worker

## 2020-08-30 ENCOUNTER — Ambulatory Visit: Payer: Self-pay | Admitting: Licensed Clinical Social Worker

## 2020-08-30 DIAGNOSIS — F411 Generalized anxiety disorder: Secondary | ICD-10-CM

## 2020-08-30 DIAGNOSIS — F339 Major depressive disorder, recurrent, unspecified: Secondary | ICD-10-CM

## 2020-08-30 NOTE — BH Specialist Note (Signed)
Integrated Behavioral Health Follow Up In-Person Visit  MRN: [id number] Name: Angelica [name]   Total time: 60 minutes  Types of Service: Individual psychotherapy  Interpretor:No. Interpretor Name and Language: N/A  Subjective: Angelica [name] is a 60 y.o. female accompanied by  herself Patient was referred by Angelica Shadow, NP  for mental Health. Patient reports the following symptoms/concerns: the patient reports that she has had some tough days since her last follow-up session. She explained that out of the blue her landlord contacted her and informed her that he was going to put the home she leased from him on the market and would need to bring realtors and potential buyers into the home over the next few weeks. She shared that her sister whom she is the sole care taker for lives with her and does not do well with changes or disruptions to her daily routine. Angelica [name] was informed to weeks later that the house sold and she had 30 days to vacate the property and the new owners would not work with her. She stated she began to panic and frantically look for a new home. She explained that she could not sleep or eat and cried frequently every day. She shared that she lived in constant fear of being on the streets with her sister who was disabled. The patient later learned that the sale of the home fell through and for now she does not have to move, however, she still is struggling with the increased anxiety and worry. The patient discussed other relationship and financial stressors in her life. She noted that she is so grateful she does not have to move and is relying on her faith to see her through this difficult time in her life. The patient denied any suicidal or homicidal thoughts.  Duration of problem: Years; Severity of problem: moderate  Objective: Mood: Euthymic and Affect: Appropriate Risk of harm to self or others: No plan to harm self or others  Life Context: Family and Social:  see above School/Work: see above Self-Care: see above Life Changes: see above  Patient and/or Family's Strengths/Protective Factors: Concrete supports in place (healthy food, safe environments, etc.)  Goals Addressed: Patient will:  Reduce symptoms of: agitation, anxiety, depression, and stress   Increase knowledge and/or ability of: coping skills, healthy habits, self-management skills, and stress reduction   Demonstrate ability to: Increase healthy adjustment to current life circumstances  Progress towards Goals: Ongoing  Interventions: Interventions utilized:  CBT Cognitive Behavioral Therapy was utilized by the clinician during today's follow up session. The clinician processed with the patient how they have been doing since the last follow-up session. The clinician provided a space for the patient to ventilate their frustrations regarding their current life circumstances. Clinician measured the patient's anxiety and depression on a numerical scale.  The clinician encouraged the patient to utilize their coping skills to deal with their current life circumstances.  Clinician explored the patient's relationship patterns. Clinician utilized psycho-education to explain the significance of self care to maintain good mental health and encouraged the patient to incorporate a time into her daily schedule to practice intentional self care.  Standardized Assessments completed: GAD-7 and PHQ 9 PHQ-9    17 GAD-7     14  Assessment: Patient currently experiencing see above.   Patient may benefit from see above.  Plan: Follow up with behavioral health clinician on : 10/04/2020 at 3:00 PM  Behavioral recommendations:  Referral(s): Angelica [name] (In Clinic) "From scale  of 1-10, how likely are you to follow plan?":   Angelica [name], LCSWA

## 2020-09-04 ENCOUNTER — Other Ambulatory Visit: Payer: Self-pay

## 2020-09-07 ENCOUNTER — Encounter: Payer: Self-pay | Admitting: Adult Health

## 2020-09-07 ENCOUNTER — Other Ambulatory Visit: Payer: Self-pay

## 2020-09-13 ENCOUNTER — Other Ambulatory Visit: Payer: Medicaid Other

## 2020-09-14 ENCOUNTER — Telehealth: Payer: Self-pay | Admitting: Pharmacy Technician

## 2020-09-14 ENCOUNTER — Other Ambulatory Visit: Payer: Medicaid Other

## 2020-09-14 ENCOUNTER — Other Ambulatory Visit: Payer: Self-pay

## 2020-09-14 DIAGNOSIS — E119 Type 2 diabetes mellitus without complications: Secondary | ICD-10-CM

## 2020-09-14 DIAGNOSIS — R899 Unspecified abnormal finding in specimens from other organs, systems and tissues: Secondary | ICD-10-CM

## 2020-09-14 NOTE — Telephone Encounter (Signed)
Patient has Medicaid with prescription coverage.  No longer meets [facility]'s eligibility criteria.  Pt notified.  [facility]

## 2020-09-15 LAB — HEMOGLOBIN A1C
Est. average glucose Bld gHb Est-mCnc: 283 mg/dL
Hgb A1c MFr Bld: 11.5 % — ABNORMAL HIGH (ref 4.8–5.6)

## 2020-09-15 LAB — URINALYSIS, ROUTINE W REFLEX MICROSCOPIC
Bilirubin, UA: NEGATIVE
Ketones, UA: NEGATIVE
Leukocytes,UA: NEGATIVE
Nitrite, UA: NEGATIVE
Protein,UA: NEGATIVE
RBC, UA: NEGATIVE
Specific Gravity, UA: >=1.03 — AB (ref 1.005–1.030)
Urobilinogen, Ur: 0.2 mg/dL (ref 0.2–1.0)
pH, UA: 5.5 (ref 5.0–7.5)

## 2020-09-19 ENCOUNTER — Other Ambulatory Visit: Payer: Self-pay

## 2020-09-20 ENCOUNTER — Other Ambulatory Visit: Payer: Self-pay

## 2020-09-20 ENCOUNTER — Ambulatory Visit: Payer: Medicaid Other | Admitting: Internal Medicine

## 2020-09-20 ENCOUNTER — Encounter: Payer: Self-pay | Admitting: Internal Medicine

## 2020-09-20 VITALS — BP 124/85 | HR 82 | Temp 98.0°F | Ht 63.0 in | Wt 196.3 lb

## 2020-09-20 DIAGNOSIS — E119 Type 2 diabetes mellitus without complications: Secondary | ICD-10-CM

## 2020-09-20 DIAGNOSIS — I10 Essential (primary) hypertension: Secondary | ICD-10-CM

## 2020-09-20 NOTE — Progress Notes (Signed)
Established Patient Office Visit  Subjective:  Patient ID: Angelica [name], female    DOB: [birth year]  Age: 60 y.o. MRN: [id number]  CC:  Chief Complaint  Patient presents with   Follow-up    Routine follow-up    HPI Angelica [name] is a 60 y/o female who presents for routine follow-up for diabetes. A1c was elevated. Pt reports she sometimes forgets to take her evening insulin medications. Education was given regarding importance of taking medication nightly and checking sugars at least three times a week alternating before breakfast and dinner. Pt will follow up with [facility] primary care in October.   Past Medical History:  Diagnosis Date   Bell palsy 08/04/2014   GERD (gastroesophageal reflux disease) 01/27/2015   History of Bell's palsy June 2016   Hyperlipidemia 01/27/2015   Hypertension 01/27/2015   Type 2 diabetes mellitus ([place]) 08/04/2014    Past Surgical History:  Procedure Laterality Date   ABDOMINAL HYSTERECTOMY     COLONOSCOPY WITH PROPOFOL N/A 10/07/2018   Procedure: COLONOSCOPY WITH PROPOFOL;  Surgeon: Virgel Manifold, MD;  Location: [facility] ENDOSCOPY;  Service: Gastroenterology;  Laterality: N/A;   OOPHORECTOMY Right 2005    Family History  Problem Relation Age of Onset   Cancer Brother        Colon Cancer   Other Mother        Covid   Hypertension Mother    Hypertension Father    Kidney disease Sister    Diabetes Sister    Heart attack Brother 43   Dementia Sister    Breast cancer Neg Hx     Social History   Socioeconomic History   Marital status: Single    Spouse name: Not on file   Number of children: 2   Years of education: Not on file   Highest education level: 11th grade  Occupational History   Occupation: unemployed  Tobacco Use   Smoking status: Never   Smokeless tobacco: Never  Vaping Use   Vaping Use: Never used  Substance and Sexual Activity   Alcohol use: Yes    Comment: "maybe 2 drinks a month"   Drug use: Not Currently    Types:  Cocaine    Comment: last use 2009   Sexual activity: Yes    Birth control/protection: Post-menopausal  Other Topics Concern   Not on file  Social History Narrative   Rents house, does rely on someone else to help out financially. Not employed      On Food stamps   Social Determinants of Health   Financial Resource Strain: High Risk   Difficulty of Paying Living Expenses: Very hard  Food Insecurity: No Food Insecurity   Worried About Charity fundraiser in the Last Year: Never true   Ran Out of Food in the Last Year: Never true  Transportation Needs: No Transportation Needs   Lack of Transportation (Medical): No   Lack of Transportation (Non-Medical): No  Physical Activity: Insufficiently Active   Days of Exercise per Week: 3 days   Minutes of Exercise per Session: 10 min  Stress: Stress Concern Present   Feeling of Stress : Very much  Social Connections: Moderately Isolated   Frequency of Communication with Friends and Family: More than three times a week   Frequency of Social Gatherings with Friends and Family: More than three times a week   Attends Religious Services: More than 4 times per year   Active Member of Clubs or Organizations: No  Attends Archivist Meetings: Never   Marital Status: Never married  Human resources officer Violence: Not on file    Outpatient Medications Prior to Visit  Medication Sig Dispense Refill   atorvastatin (LIPITOR) 40 MG tablet TAKE ONE TABLET BY MOUTH EVERY DAY AT 6PM 90 tablet 0   clopidogrel (PLAVIX) 75 MG tablet TAKE ONE TABLET BY MOUTH EVERY DAY 90 tablet 0   COMFORT EZ PEN NEEDLES 32G X 4 MM MISC USE TO TEST BLOOD GLUCOSE UP TO 4 TIMES PER DAY, IN THE MORNING AND AFTER MEALS 100 each 11   EMGALITY 120 MG/ML SOAJ Inject 1108m (140m subcutaneously once every 30 days. 5 mL 3   hydrochlorothiazide (MICROZIDE) 12.5 MG capsule TAKE ONE CAPSULE BY MOUTH EVERY DAY 90 capsule 0   insulin glargine (LANTUS) 100 UNIT/ML Solostar Pen Inject  43 Units into the skin at bedtime. 30 mL 3   metFORMIN (GLUCOPHAGE-XR) 500 MG 24 hr tablet TAKE 2 TABLETS(1,000MG TOTAL) BY MOUTH TWO TIMES DAILY WITH BREAKFAST AND BEFORE SUPPER 180 tablet 2   miconazole (MONISTAT 7) 2 % vaginal cream Place 1 applicatorful vaginally at bedtime. 45 g 0   pantoprazole (PROTONIX) 40 MG tablet Take 1 tablet (40 mg total) by mouth 2 (two) times daily as needed. 60 tablet 2   Rimegepant Sulfate 75 MG TBDP Take 75 mg by mouth daily. As needed - do not exceed more than 1 dose in 24 hours (Patient not taking: Reported on 06/15/2020)     Semaglutide, 1 MG/DOSE, (OZEMPIC, 1 MG/DOSE,) 2 MG/1.5ML SOPN Inject 1 mg under the skin every seven (7) days. 3 mL 11   Semaglutide,0.25 or 0.5MG/DOS, 2 MG/1.5ML SOPN INJECT 0.5MG INTO THE SKIN ONCE A WEEK. 1.5 mL 3   No facility-administered medications prior to visit.    No Known Allergies  ROS Review of Systems    Objective:    Physical Exam  BP 124/85 (BP Location: Left Arm, Patient Position: Sitting, Cuff Size: Large)   Pulse 82   Temp 98 F (36.7 C)   Ht '5\' 3"'  (1.6 m)   Wt 196 lb 4.8 oz (89 kg)   LMP 03/11/2013 (Within Months)   SpO2 95%   BMI 34.77 kg/m  Wt Readings from Last 3 Encounters:  09/20/20 196 lb 4.8 oz (89 kg)  06/15/20 197 lb 4.8 oz (89.5 kg)  05/03/20 200 lb 8 oz (90.9 kg)     Health Maintenance Due  Topic Date Due   TETANUS/TDAP  Never done   Zoster Vaccines- Shingrix (1 of 2) Never done   Pneumococcal Vaccine 0-35464ears old (2 - PCV) 12/21/2015   COVID-19 Vaccine (3 - Moderna risk series) 10/20/2019   OPHTHALMOLOGY EXAM  05/31/2020   INFLUENZA VACCINE  08/21/2020   URINE MICROALBUMIN  09/14/2020    There are no preventive care reminders to display for this patient.  Lab Results  Component Value Date   TSH 1.440 04/28/2019   Lab Results  Component Value Date   WBC 6.9 06/14/2019   HGB 12.9 06/14/2019   HCT 39.2 06/14/2019   MCV 80.5 06/14/2019   PLT 338 06/14/2019   Lab  Results  Component Value Date   NA 139 11/03/2019   K 4.1 11/03/2019   CO2 25 11/03/2019   GLUCOSE 310 (H) 11/03/2019   BUN 12 11/03/2019   CREATININE 0.66 11/03/2019   BILITOT <0.2 11/03/2019   ALKPHOS 165 (H) 11/03/2019   AST 15 11/03/2019   ALT 24 11/03/2019  PROT 7.4 11/03/2019   ALBUMIN 4.0 11/03/2019   CALCIUM 9.6 11/03/2019   ANIONGAP 7 06/14/2019   EGFR 111 10/30/2017   Lab Results  Component Value Date   CHOL 142 09/15/2019   Lab Results  Component Value Date   HDL 42 09/15/2019   Lab Results  Component Value Date   LDLCALC 85 09/15/2019   Lab Results  Component Value Date   TRIG 75 09/15/2019   Lab Results  Component Value Date   CHOLHDL 3.4 09/15/2019   Lab Results  Component Value Date   HGBA1C 11.5 (H) 09/14/2020      Assessment & Plan:   Problem List Items Addressed This Visit       Cardiovascular and Mediastinum   Hypertension     Endocrine   Diabetes mellitus without complication ([place]) - Primary    No orders of the defined types were placed in this encounter.  1. Diabetes mellitus without complication (HCC) V4T is quite elevated, 11.5. She has an issue of taking her Lantus insulin in the evening as she often forgets. She does not check her sugar levels on a consistent schedule. Spent time explaining the value of Lantus insulin and taking it in the evening as well as the importance of self monitoring sugar levels to aid in medication adjustments.  2. Primary hypertension [place] is currently at target, no medication adjustments.   Patient has full time medicaid medical coverage at this time and will be discharged from our clinic. Already has established a primary care team at [facility]. Has an appointment in October. Suggested they update her lipid and thyroid panel if they have not been done in recent months.  Follow Up:  Pt will follow-up with [facility] primary care in October    Dede Query, [place]

## 2020-10-04 ENCOUNTER — Ambulatory Visit: Payer: Medicaid Other | Admitting: Licensed Clinical Social Worker

## 2020-10-09 ENCOUNTER — Other Ambulatory Visit: Payer: Self-pay

## 2020-10-10 ENCOUNTER — Telehealth: Payer: Self-pay | Admitting: Licensed Clinical Social Worker

## 2020-10-10 ENCOUNTER — Ambulatory Visit: Payer: Medicaid Other | Admitting: Adult Health

## 2020-10-11 ENCOUNTER — Ambulatory Visit: Payer: Medicaid Other | Admitting: Licensed Clinical Social Worker

## 2020-10-11 ENCOUNTER — Other Ambulatory Visit: Payer: Self-pay

## 2020-10-13 ENCOUNTER — Other Ambulatory Visit: Payer: Self-pay

## 2020-10-16 ENCOUNTER — Other Ambulatory Visit: Payer: Self-pay

## 2020-10-16 MED ORDER — TOUJEO SOLOSTAR 300 UNIT/ML ~~LOC~~ SOPN: PEN_INJECTOR | SUBCUTANEOUS | 3 refills | Status: AC

## 2020-10-16 MED ORDER — OZEMPIC (0.25 OR 0.5 MG/DOSE) 2 MG/1.5ML ~~LOC~~ SOPN: PEN_INJECTOR | SUBCUTANEOUS | 11 refills | Status: AC

## 2020-10-18 ENCOUNTER — Ambulatory Visit: Payer: Self-pay | Admitting: Licensed Clinical Social Worker

## 2020-10-18 ENCOUNTER — Ambulatory Visit: Payer: Medicaid Other | Admitting: Licensed Clinical Social Worker

## 2020-10-18 ENCOUNTER — Other Ambulatory Visit: Payer: Self-pay

## 2020-10-18 DIAGNOSIS — F411 Generalized anxiety disorder: Secondary | ICD-10-CM

## 2020-10-18 DIAGNOSIS — F339 Major depressive disorder, recurrent, unspecified: Secondary | ICD-10-CM

## 2020-10-18 NOTE — BH Specialist Note (Deleted)
Integrated Behavioral Health Follow Up In-Person Visit  MRN: [id number] Name: Angelica [name]  Number of [place] Clinician visits: {IBH Number of Visits:21014052} Session Start time: ***  Session End time: *** Total time: 60 minutes  Types of Service: {CHL AMB TYPE OF SERVICE:[phone]}  Interpretor:{yes YQ:[id number]} Interpretor Name and Language: ***  Subjective: Angelica [name] is a 60 y.o. female accompanied by {Patient accompanied by:[phone]} Patient was referred by *** for ***. Patient reports the following symptoms/concerns: *** Duration of problem: ***; Severity of problem: {Mild/Moderate/Severe:20260}  Objective: Mood: {BHH MOOD:22306} and Affect: {BHH AFFECT:22307} Risk of harm to self or others: {CHL AMB BH Suicide Current Mental Status:21022748}  Life Context: Family and Social: *** School/Work: *** Self-Care: *** Life Changes: ***  Patient and/or Family's Strengths/Protective Factors: {CHL AMB BH PROTECTIVE [phone]}  Goals Addressed: Patient will:  Reduce symptoms of: {IBH Symptoms:21014056}   Increase knowledge and/or ability of: {IBH Patient Tools:21014057}   Demonstrate ability to: {IBH Goals:21014053}  Progress towards Goals: {CHL AMB BH PROGRESS TOWARDS GOALS:[phone]}  Interventions: Interventions utilized:  {IBH Interventions:21014054} Standardized Assessments completed: {IBH Screening Tools:21014051}  Patient and/or Family Response: ***  Patient Centered Plan: Patient is on the following Treatment Plan(s): *** Assessment: Patient currently experiencing ***.   Patient may benefit from ***.  Plan: Follow up with behavioral health clinician on : *** Behavioral recommendations: *** Referral(s): {IBH Referrals:21014055} "From scale of 1-10, how likely are you to follow plan?": ***  Angelica [name], LCSWA

## 2020-10-19 DIAGNOSIS — H5213 Myopia, bilateral: Secondary | ICD-10-CM | POA: Diagnosis not present

## 2020-10-19 NOTE — Telephone Encounter (Signed)
Left MessageCommunicated - Patient needs to be rescheduled for next week OR patient can elect to receive referrals from [facility] since she has medicaid now. Patient can have one more visit. UPDATE: Patient was rescheduled for next Wednesday 9/28 at 11am.

## 2020-10-19 NOTE — BH Specialist Note (Signed)
Integrated Behavioral Health Follow Up In-Person Visit  MRN: [id number] Name: Angelica [name]   Total time: 60 minutes  Types of Service: Individual psychotherapy  Interpretor:No. Interpretor Name and Language: N/A  Subjective: Angelica [name] is a 60 y.o. female accompanied by  herself Patient was referred by Carlyon Shadow, NP for Mental Health. Patient reports the following symptoms/concerns: The patient reports that she has been doing very well since her last follow-up appointment. She noted that she has Medicaid and is receiving more finical assistance through Disability. She shared that he was having difficulty getting her insulin prescription filled and asked if the clinician could discuss this with her previous primary care provider at [facility]. Angelica [name] reported that her landlord kept his word and has allowed her to remain in her home. 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 She share her brother does try to help, but he has his own family and lives to far away. The patient explained that overall she is doing well. Angelica [name] denied any homicidal or suicidal thoughts.  Duration of problem: Years; Severity of problem: moderate  Objective: Mood: Euthymic and Affect: Appropriate Risk of harm to self or others: No plan to harm self or others  Life Context: Family and Social: see above School/Work: see above Self-Care: see above Life Changes: see above  Patient and/or Family's Strengths/Protective Factors: Concrete supports in place (healthy food, safe environments, etc.)  Goals Addressed: Patient will:  Reduce symptoms of: agitation, anxiety, depression, and stress   Increase knowledge and/or ability of: coping skills, healthy habits, self-management skills, and stress reduction   Demonstrate ability to: Increase healthy adjustment to current life  circumstances and Increase adequate support systems for patient/family  Progress towards Goals: Ongoing  Interventions: Interventions utilized:  Supportive Counseling was utilized by the clinician during today's follow up session. The clinician processed with the patient how they have been doing since the last follow-up session. The clinician provided a space for the patient to ventilate their frustrations regarding their current life circumstances. Clinician reviewed with the patient the progress she has made and congratulated her for doing so well. Clinician encouraged the client to continue to use her coping skills to deal with her current life circumstances. Clinician encouraged the client to continue to practice self care. Clinician explained that the patient would receive  several referrals to other mental health providers and if she experienced difficulties. Session ended with CMA joining to discuss the patient's medication concerns regarding her insulin prescriptions.     Assessment: Patient currently experiencing see above.   Patient may benefit from see above.  Plan: Follow up with behavioral health clinician on :  Behavioral recommendations:  Referral(s): [place] (LME/Outside Clinic) Referral to [place] and [facility] "From scale of 1-10, how likely are you to follow plan?":   Lesli Albee, LCSWA

## 2020-10-20 ENCOUNTER — Other Ambulatory Visit: Payer: Self-pay

## 2020-10-26 ENCOUNTER — Telehealth: Payer: Self-pay | Admitting: Licensed Clinical Social Worker

## 2020-10-26 NOTE — Telephone Encounter (Signed)
Called patient to refer them to [facility]. Shared the phone number & address and client said they will call to establish care with them. I will follow up each week for 3 weeks to check in on transition of care. -BG

## 2020-10-31 ENCOUNTER — Other Ambulatory Visit: Payer: Self-pay

## 2020-10-31 ENCOUNTER — Ambulatory Visit: Payer: Medicaid Other | Admitting: Physician Assistant

## 2020-10-31 DIAGNOSIS — Z113 Encounter for screening for infections with a predominantly sexual mode of transmission: Secondary | ICD-10-CM

## 2020-10-31 DIAGNOSIS — F32A Depression, unspecified: Secondary | ICD-10-CM

## 2020-10-31 DIAGNOSIS — B3731 Acute candidiasis of vulva and vagina: Secondary | ICD-10-CM

## 2020-10-31 LAB — WET PREP FOR TRICH, YEAST, CLUE: Trichomonas Exam: NEGATIVE

## 2020-10-31 LAB — HM HIV SCREENING LAB: HM HIV Screening: NEGATIVE

## 2020-10-31 LAB — HM HEPATITIS C SCREENING LAB: HM Hepatitis Screen: NEGATIVE

## 2020-10-31 MED ORDER — CLOTRIMAZOLE 1 % VA CREA
1.0000 | TOPICAL_CREAM | Freq: Every day | VAGINAL | 0 refills | Status: DC
Start: 2020-10-31 — End: 2020-11-01
  Filled 2020-10-31: qty 45, fill #0

## 2020-11-01 ENCOUNTER — Encounter: Payer: Self-pay | Admitting: Physician Assistant

## 2020-11-01 ENCOUNTER — Other Ambulatory Visit: Payer: Self-pay

## 2020-11-01 DIAGNOSIS — F32A Depression, unspecified: Secondary | ICD-10-CM | POA: Insufficient documentation

## 2020-11-01 MED ORDER — CLOTRIMAZOLE 1 % VA CREA: 1.0000 | TOPICAL_CREAM | Freq: Every day | VAGINAL | 0 refills | Status: AC

## 2020-11-01 NOTE — Progress Notes (Signed)
[facility] Department STI clinic/screening visit  Subjective:  Angelica [name] is a 60 y.o. female being seen today for an STI screening visit. The patient reports they do have symptoms.  Patient reports that they do not desire a pregnancy in the next year.   They reported they are not interested in discussing contraception today.  Patient's last menstrual period was 03/11/2013 (within months).   Patient has the following medical conditions:   Patient Active Problem List   Diagnosis Date Noted   Vaginal discharge 06/15/2020   Degenerative arthritis 12/14/2019   Hypercoagulable state ([place]) 10/07/2019   Abnormal laboratory test result 09/28/2019   Tooth loose 08/04/2019   Weakness of right leg 07/20/2019   Lacunar infarct, acute [facility])    Stroke ([place]) 06/14/2019   Headache 10/20/2018   Insomnia 10/20/2018   Anxiety 10/20/2018   Dysuria 10/20/2018   Encounter for screening colonoscopy    Residual hemorrhoidal skin tags    Internal hemorrhoids    Polyp of ascending colon    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  Reports that she has DM and HTN for which she takes medicines as prescribed.  States last HIV test was in 2020 and last pap was also in 2020.  States that she is postmenopausal.  See flowsheet for further details and programmatic requirements.    The following portions of the patient's history were reviewed and updated as appropriate: allergies, current medications, past medical history, past social history, past surgical  history and problem list.  Objective:  There were no vitals filed for this visit.  Physical Exam Constitutional:      General: She is not in acute distress.    Appearance: Normal appearance.  HENT:     Head: Normocephalic and atraumatic.     Comments: No nits,lice, or hair loss. No cervical, supraclavicular or axillary adenopathy.     Mouth/Throat:     Mouth: Mucous membranes are moist.     Pharynx: Oropharynx is clear. No oropharyngeal exudate or posterior oropharyngeal erythema.  Eyes:     Conjunctiva/sclera: Conjunctivae normal.  Pulmonary:     Effort: Pulmonary effort is normal.  Abdominal:     Palpations: Abdomen is soft. There is no mass.     Tenderness: There is no abdominal tenderness. There is no guarding or rebound.  Genitourinary:    General: Normal vulva.     Rectum: Normal.     Comments: External genitalia/pubic area without nits, lice, edema, erythema, lesions and inguinal adenopathy. Vagina with normal mucosa and moderate amount of white, clumping discharge. Cervix without visible lesions. Uterus firm, mobile, nt, no masses, no CMT, no adnexal tenderness or fullness.  Musculoskeletal:     Cervical back: Neck supple. No tenderness.  Skin:    General: Skin is warm and dry.     Findings: No bruising, erythema, lesion or rash.  Neurological:     Mental Status: She is alert and oriented to person, place, and time.  Psychiatric:        Mood and Affect: Mood normal.  Behavior: Behavior normal.        Thought Content: Thought content normal.        Judgment: Judgment normal.     Assessment and Plan:  Angelica [name] is a 60 y.o. female presenting to the [facility] Department for STI screening  1. Screening for STD (sexually transmitted disease) Patient into clinic with symptoms. Reviewed with patient wet mount results.  Rec condoms with all sex. Await test results.  Counseled that RN will call if needs to RTC for treatment once results are  back.  - WET PREP FOR TRICH, YEAST, CLUE - Chlamydia/Gonorrhea [place] [facility] - HIV/HCV [place] [facility] - Syphilis Serology, [facility]  2. Candidiasis of vulva and vagina Will treat yeast with Clotrimazole 1 % vaginal cream 1 app qhs for 7 days. No sex for 10 days. - clotrimazole (CLOTRIMAZOLE-7) 1 % vaginal cream; Place 1 Applicatorful vaginally at bedtime.  Dispense: 45 g; Refill: 0     No follow-ups on file.  No future appointments.  [name], PA

## 2020-11-03 DIAGNOSIS — I10 Essential (primary) hypertension: Secondary | ICD-10-CM | POA: Diagnosis not present

## 2020-11-03 DIAGNOSIS — E119 Type 2 diabetes mellitus without complications: Secondary | ICD-10-CM | POA: Diagnosis not present

## 2020-11-03 DIAGNOSIS — E785 Hyperlipidemia, unspecified: Secondary | ICD-10-CM | POA: Diagnosis not present

## 2020-11-03 DIAGNOSIS — K219 Gastro-esophageal reflux disease without esophagitis: Secondary | ICD-10-CM | POA: Diagnosis not present

## 2020-11-07 ENCOUNTER — Telehealth: Payer: Self-pay | Admitting: Licensed Clinical Social Worker

## 2020-11-07 NOTE — Telephone Encounter (Signed)
Calling patient to follow up on referral to [facility]. Left voicemail.

## 2020-11-09 DIAGNOSIS — M79602 Pain in left arm: Secondary | ICD-10-CM | POA: Diagnosis not present

## 2020-11-09 DIAGNOSIS — M25512 Pain in left shoulder: Secondary | ICD-10-CM | POA: Diagnosis not present

## 2020-11-10 ENCOUNTER — Telehealth: Payer: Self-pay

## 2020-11-10 DIAGNOSIS — M25512 Pain in left shoulder: Secondary | ICD-10-CM | POA: Diagnosis not present

## 2020-11-10 NOTE — Telephone Encounter (Signed)
Transition Care Management Unsuccessful Follow-up Telephone Call  Date of discharge and from where:  11/09/2020-[facility]  Attempts:  1st Attempt  Reason for unsuccessful TCM follow-up call:  Left voice message

## 2020-11-11 NOTE — Telephone Encounter (Signed)
Transition Care Management Unsuccessful Follow-up Telephone Call  Date of discharge and from where:  11/09/2020 from [facility]  Attempts:  2nd Attempt  Reason for unsuccessful TCM follow-up call:  Left voice message

## 2020-11-13 NOTE — Telephone Encounter (Signed)
Transition Care Management Unsuccessful Follow-up Telephone Call  Date of discharge and from where:  11/09/2020 from [facility]  Attempts:  3rd Attempt  Reason for unsuccessful TCM follow-up call:  Unable to reach patient

## 2020-11-15 DIAGNOSIS — M5412 Radiculopathy, cervical region: Secondary | ICD-10-CM | POA: Diagnosis not present

## 2020-11-16 ENCOUNTER — Telehealth: Payer: Self-pay | Admitting: Licensed Clinical Social Worker

## 2020-11-16 NOTE — Telephone Encounter (Signed)
Called on 10/27 and left message inquiring whether she was able to make an appointment with [facility].

## 2020-11-16 NOTE — Telephone Encounter (Signed)
Angelica [name] called back on 10/27- she had her initial intake appointment with [facility] and is waiting to hear back about an appointment.

## 2020-11-21 DIAGNOSIS — M25512 Pain in left shoulder: Secondary | ICD-10-CM | POA: Diagnosis not present

## 2020-11-23 DIAGNOSIS — M25519 Pain in unspecified shoulder: Secondary | ICD-10-CM | POA: Diagnosis not present

## 2020-11-23 DIAGNOSIS — E119 Type 2 diabetes mellitus without complications: Secondary | ICD-10-CM | POA: Diagnosis not present

## 2020-12-08 DIAGNOSIS — E1169 Type 2 diabetes mellitus with other specified complication: Secondary | ICD-10-CM | POA: Diagnosis not present

## 2020-12-08 DIAGNOSIS — R1032 Left lower quadrant pain: Secondary | ICD-10-CM | POA: Diagnosis not present

## 2020-12-08 DIAGNOSIS — N939 Abnormal uterine and vaginal bleeding, unspecified: Secondary | ICD-10-CM | POA: Diagnosis not present

## 2020-12-08 DIAGNOSIS — R748 Abnormal levels of other serum enzymes: Secondary | ICD-10-CM | POA: Diagnosis not present

## 2020-12-08 DIAGNOSIS — I10 Essential (primary) hypertension: Secondary | ICD-10-CM | POA: Diagnosis not present

## 2021-01-04 ENCOUNTER — Other Ambulatory Visit: Payer: Self-pay

## 2021-01-08 ENCOUNTER — Other Ambulatory Visit: Payer: Self-pay

## 2021-01-10 DIAGNOSIS — J101 Influenza due to other identified influenza virus with other respiratory manifestations: Secondary | ICD-10-CM | POA: Diagnosis not present

## 2021-01-10 DIAGNOSIS — R059 Cough, unspecified: Secondary | ICD-10-CM | POA: Diagnosis not present

## 2021-01-18 ENCOUNTER — Other Ambulatory Visit: Payer: Self-pay

## 2021-02-16 DIAGNOSIS — N950 Postmenopausal bleeding: Secondary | ICD-10-CM | POA: Diagnosis not present

## 2021-02-16 DIAGNOSIS — D251 Intramural leiomyoma of uterus: Secondary | ICD-10-CM | POA: Diagnosis not present

## 2021-02-16 DIAGNOSIS — N939 Abnormal uterine and vaginal bleeding, unspecified: Secondary | ICD-10-CM | POA: Diagnosis not present

## 2021-02-16 DIAGNOSIS — N83201 Unspecified ovarian cyst, right side: Secondary | ICD-10-CM | POA: Diagnosis not present

## 2021-03-12 DIAGNOSIS — I10 Essential (primary) hypertension: Secondary | ICD-10-CM | POA: Diagnosis not present

## 2021-03-12 DIAGNOSIS — K219 Gastro-esophageal reflux disease without esophagitis: Secondary | ICD-10-CM | POA: Diagnosis not present

## 2021-03-12 DIAGNOSIS — I639 Cerebral infarction, unspecified: Secondary | ICD-10-CM | POA: Diagnosis not present

## 2021-03-13 ENCOUNTER — Other Ambulatory Visit: Payer: Self-pay

## 2021-03-13 DIAGNOSIS — N950 Postmenopausal bleeding: Secondary | ICD-10-CM | POA: Diagnosis not present

## 2021-03-13 DIAGNOSIS — N840 Polyp of corpus uteri: Secondary | ICD-10-CM | POA: Diagnosis not present

## 2021-03-13 MED ORDER — MISOPROSTOL 200 MCG PO TABS: ORAL_TABLET | ORAL | 0 refills | Status: AC

## 2021-03-29 DIAGNOSIS — N83201 Unspecified ovarian cyst, right side: Secondary | ICD-10-CM | POA: Diagnosis not present

## 2021-03-29 DIAGNOSIS — D259 Leiomyoma of uterus, unspecified: Secondary | ICD-10-CM | POA: Diagnosis not present

## 2021-03-29 DIAGNOSIS — N888 Other specified noninflammatory disorders of cervix uteri: Secondary | ICD-10-CM | POA: Diagnosis not present

## 2021-04-12 DIAGNOSIS — N8502 Endometrial intraepithelial neoplasia [EIN]: Secondary | ICD-10-CM | POA: Diagnosis not present

## 2021-05-09 DIAGNOSIS — E119 Type 2 diabetes mellitus without complications: Secondary | ICD-10-CM | POA: Diagnosis not present

## 2021-05-09 DIAGNOSIS — Z30430 Encounter for insertion of intrauterine contraceptive device: Secondary | ICD-10-CM | POA: Diagnosis not present

## 2021-05-09 DIAGNOSIS — N8502 Endometrial intraepithelial neoplasia [EIN]: Secondary | ICD-10-CM | POA: Diagnosis not present

## 2021-05-09 DIAGNOSIS — E669 Obesity, unspecified: Secondary | ICD-10-CM | POA: Diagnosis not present

## 2021-05-09 DIAGNOSIS — Z8673 Personal history of transient ischemic attack (TIA), and cerebral infarction without residual deficits: Secondary | ICD-10-CM | POA: Diagnosis not present

## 2021-05-15 ENCOUNTER — Other Ambulatory Visit (HOSPITAL_BASED_OUTPATIENT_CLINIC_OR_DEPARTMENT_OTHER): Payer: Self-pay

## 2021-05-21 DIAGNOSIS — N8502 Endometrial intraepithelial neoplasia [EIN]: Secondary | ICD-10-CM | POA: Diagnosis not present

## 2021-05-21 DIAGNOSIS — Z09 Encounter for follow-up examination after completed treatment for conditions other than malignant neoplasm: Secondary | ICD-10-CM | POA: Diagnosis not present

## 2021-07-13 ENCOUNTER — Other Ambulatory Visit: Payer: Medicaid Other

## 2021-09-07 ENCOUNTER — Other Ambulatory Visit: Payer: Self-pay | Admitting: Obstetrics and Gynecology

## 2021-09-07 NOTE — Patient Outreach (Signed)
Medicaid Managed Care   Nurse Care Manager Note  09/07/2021 Name:  Angelica [name] MRN:  [id number] DOB:  1960/08/26  Angelica [name] is an 61 y.o. year old female who is a primary patient of [facility] [place].  The [facility] Managed Care Coordination team was consulted for assistance with:    Chronic healthcare management needs, HTN, h/o stroke, GERD, DM, arthritis, HLD, headache, anxiety, depression, insomnia  Ms. Pangle was given information about Medicaid Managed Care Coordination team services today. [name] Patient agreed to services and verbal consent obtained.  Engaged with patient by telephone for initial visit in response to provider referral for case management and/or care coordination services.   Assessments/Interventions:  Review of past medical history, allergies, medications, health status, including review of consultants reports, laboratory and other test data, was performed as part of comprehensive evaluation and provision of chronic care management services.  SDOH (Social Determinants of Health) assessments and interventions performed: SDOH Interventions    Flowsheet Row Most Recent Value  SDOH Interventions   Transportation Interventions Intervention Not Indicated      Care Plan  No Known Allergies  Medications Reviewed Today     Reviewed by Gayla Medicus, RN (Registered Nurse) on 09/07/21 at 1323  Med List Status: <None>   Medication Order Taking? Sig Documenting Provider Last Dose Status Informant  atorvastatin (LIPITOR) 40 MG tablet [id number]  TAKE ONE TABLET BY MOUTH EVERY DAY AT 6PM Iloabachie, Chioma E, NP  Expired 06/15/21 2359   clotrimazole (CLOTRIMAZOLE-7) 1 % vaginal cream [id number] No Place 1 Applicatorful vaginally at bedtime.  Patient not taking: Reported on 09/07/2021   [name], [place] Not Taking Active   [facility] 120 MG/ML SOAJ [id number] No Inject '120mg'$  (52m) subcutaneously once every 30 days.  Patient not taking: Reported on  09/07/2021    Not Taking Active   hydrochlorothiazide (MICROZIDE) 12.5 MG capsule [id number] TAKE ONE CAPSULE BY MOUTH EVERY DAY Iloabachie, Chioma E, NP  Expired 06/15/21 2359   insulin glargine (LANTUS) 100 UNIT/ML Solostar Pen [id number] Inject 43 Units into the skin at bedtime. Iloabachie, Chioma E, NP  Expired 06/20/21 2359   insulin glargine, 1 Unit Dial, (TOUJEO SOLOSTAR) 300 UNIT/ML Solostar Pen [id number] Inject 10 Units under the skin daily.  Patient not taking: Reported on 09/07/2021    Not Taking Active   metFORMIN (GLUCOPHAGE-XR) 500 MG 24 hr tablet [id number] TAKE 2 TABLETS(1,'000MG'$  TOTAL) BY MOUTH TWO TIMES DAILY WITH BREAKFAST AND BEFORE SUPPER Iloabachie, Chioma E, NP  Expired 11/15/20 2359   miconazole (MONISTAT 7) 2 % vaginal cream [id number] Place 1 applicatorful vaginally at bedtime.  Patient not taking: Reported on 09/07/2021   ICaryl AspE, NP Not Taking Active   misoprostol (CYTOTEC) 200 MCG tablet [id number] Take 1 tablet (200 mcg total) by mouth once for 1 dose. Take one tablet the night before your surgery. Take the other tablet the morning of your surgery with a small sip of water.  Patient not taking: Reported on 09/07/2021    Not Taking Active   pantoprazole (PROTONIX) 40 MG tablet [id number] Take 1 tablet (40 mg total) by mouth 2 (two) times daily as needed.  Patient not taking: Reported on 09/07/2021   ICaryl AspE, NP Not Taking Active   Rimegepant Sulfate 75 MG TBDP [id number] Take 75 mg by mouth daily. As needed - do not exceed more than 1 dose in 24 hours  Patient not taking: Reported  on 06/15/2020   [provider]  Active            Med Note Marcello Moores, PAULA   Thu Jun 15, 2020 11:25 AM) Hasn't started yet  Semaglutide, 1 MG/DOSE, (OZEMPIC, 1 MG/DOSE,) 2 MG/1.5ML SOPN [id number] No Inject 1 mg under the skin every seven (7) days.  Patient not taking: Reported on 09/07/2021    Not Taking Active   Semaglutide,0.25 or 0.'5MG'$ /DOS,  (OZEMPIC, 0.25 OR 0.5 MG/DOSE,) 2 MG/1.5ML SOPN [id number] No Inject 0.5 mg under the skin once a week.  Patient not taking: Reported on 09/07/2021    Not Taking Active            Patient Active Problem List   Diagnosis Date Noted   Depression 11/01/2020   Vaginal discharge 06/15/2020   Degenerative arthritis 12/14/2019   Hypercoagulable state ([place]) 10/07/2019   Abnormal laboratory test result 09/28/2019   Tooth loose 08/04/2019   Weakness of right leg 07/20/2019   Lacunar infarct, acute ([place])    Stroke ([place]) 06/14/2019   Headache 10/20/2018   Insomnia 10/20/2018   Anxiety 10/20/2018   Dysuria 10/20/2018   Encounter for screening colonoscopy    Residual hemorrhoidal skin tags    Internal hemorrhoids    Polyp of ascending colon    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   Healthcare maintenance 09/23/2018   Menopausal hot flushes 07/20/2015   GERD (gastroesophageal reflux disease) 01/27/2015   Hypertension 01/27/2015   Hyperlipidemia 01/27/2015   Bell palsy 08/04/2014   Diabetes mellitus without complication ([place]) 53/29/9242   Conditions to be addressed/monitored per PCP order:  Chronic healthcare management needs, HTN, h/o stroke, GERD, DM, arthritis, HLD, headache, anxiety, depression, insomnia  Care Plan : RN Care Manager Plan of Care  Updates made by Gayla Medicus, RN since 09/07/2021 12:00 AM     Problem: Health Promotion or Disease Self-Management (General Plan of Care)      Long-Range Goal: Chronic Disease Management and Care Coordination Needs   Start Date: 09/07/2021  Expected End Date: 12/08/2021  Priority: High  Note:   Current Barriers:  Knowledge Deficits related to plan of care for management of chronic healthcare conditions, DM, anxiety  Care Coordination needs related to Mental Health Concerns  Chronic Disease Management support and 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   RNCM Clinical Goal(s):  Patient will verbalize understanding of plan for management of  DMII and Anxiety as evidenced by patient report verbalize basic understanding of  DMII and Anxiety disease process and self health management plan as evidenced by patient report take all medications exactly as prescribed and will call provider for medication related questions as evidenced by patient report demonstrate understanding of rationale for each prescribed medication as evidenced by patient report attend all scheduled medical appointments as evidenced by patient report continue to work with RN Care Manager to address care management and care coordination needs related to  DMII and Anxiety as evidenced by adherence to CM Team Scheduled appointments work with Education officer, museum to address  related to the management of Mental Health Concerns  related to the management of Anxiety as evidenced by review of EMR and patient or Education officer, museum report through collaboration with Consulting civil engineer, provider, and care team.   Interventions: Inter-disciplinary care team collaboration (see longitudinal plan of care) Evaluation of current treatment plan related to  self management and patient's adherence to plan as established by provider Collaborated with LCSW LCSW referral for anxiety, support, referral  Diabetes Interventions:  (  Status:  New goal. and Goal on track:  NO.) Long Term Goal Assessed patient's understanding of A1c goal: <7% Provided education to patient about basic DM disease process Reviewed medications with patient and discussed importance of medication adherence Counseled on importance of regular laboratory monitoring as prescribed Discussed plans with patient for ongoing care management follow up and provided patient with direct contact information for care management team Reviewed scheduled/upcoming provider appointments  Advised patient, providing education and rationale, to check cbg and record, calling provider  for findings outside established parameters Referral made to social work  team for assistance with anxiety, stress, referral. Review of patient status, including review of consultants reports, relevant laboratory and other test results, and medications completed Assessed social determinant of health barriers Lab Results  Component Value Date   HGBA1C 11.5 (H) 09/14/2020  Patient Goals/Self-Care Activities: Take all medications as prescribed Attend all scheduled provider appointments Call pharmacy for medication refills 3-7 days in advance of running out of medications Perform all self care activities independently  Perform IADL's (shopping, preparing meals, housekeeping, managing finances) independently Call provider office for new concerns or questions  Work with the social worker to address care coordination needs and will continue to work with the clinical team to address health care and disease management related needs  Follow Up Plan:  The patient has been provided with contact information for the care management team and has been advised to call with any health related questions or concerns.  The care management team will reach out to the patient again over the next 30 business  days.    Long-Range Goal: Establish Plan of Care for Chronic Disease Management Needs   Priority: High  Note:   Timeframe:  Long-Range Goal Priority:  High Start Date:     09/07/21                        Expected End Date:     ongoing                  Follow Up Date 10/18/21    - practice safe sex - schedule appointment for flu shot - schedule appointment for vaccines needed due to my age or health - schedule recommended health tests (blood work, mammogram, colonoscopy, pap test) - schedule and keep appointment for annual check-up    Why is this important?   Screening tests can find diseases early when they are easier to treat.  Your doctor or nurse will talk with you about which tests are important for you.  Getting shots for common diseases like the flu and shingles will  help prevent them.     Follow Up:  Patient agrees to Care Plan and Follow-up.  Plan: The Managed Medicaid care management team will reach out to the patient again over the next 30 business  days. and The  Patient has been provided with contact information for the Managed Medicaid care management team and has been advised to call with any health related questions or concerns.  Date/time of next scheduled RN care management/care coordination outreach:  10/18/21 at 230.

## 2021-09-07 NOTE — Patient Instructions (Signed)
Visit Information  Angelica [name] was given information about Medicaid Managed Care team care coordination services as a part of their [place] Medicaid benefit. Angelica [name] verbally consented to engagement with the [facility] Managed Care team.   If you are experiencing a medical emergency, please call 911 or report to your local emergency department or urgent care.   If you have a non-emergency medical problem during routine business hours, please contact your provider's office and ask to speak with a nurse.   For questions related to your [facility], please call: [phone] or visit the homepage here: [email or web address]  If you would like to schedule transportation through your [facility], please call the following number at least 2 days in advance of your appointment: [phone]   Rides for urgent appointments can also be made after hours by calling Member Services.  Call the [place] at [phone], at any time, 24 hours a day, 7 days a week. If you are in danger or need immediate medical attention call 911.  If you would like help to quit smoking, call 1-800-QUIT-NOW 442-339-9656) OR Espaol: 1-855-Djelo-Ya (0-354-656-8127) o para ms informacin haga clic aqu or Text READY to 200-400 to register via text  Angelica [name] - following are the goals we discussed in your visit today:   Goals Addressed    Timeframe:  Long-Range Goal Priority:  High Start Date:     09/07/21                        Expected End Date:     ongoing                  Follow Up Date 10/18/21    - practice safe sex - schedule appointment for flu shot - schedule appointment for vaccines needed due to my age or health - schedule recommended health tests (blood work, mammogram, colonoscopy, pap test) - schedule and keep appointment for annual check-up    Why  is this important?   Screening tests can find diseases early when they are easier to treat.  Your doctor or nurse will talk with you about which tests are important for you.  Getting shots for common diseases like the flu and shingles will help prevent them  The patient verbalized understanding of instructions, educational materials, and care plan provided today and agreed to receive a mailed copy of patient instructions, educational materials, and care plan.   The Managed Medicaid care management team will reach out to the patient again over the next 30 business  days.  The  Patient  has been provided with contact information for the Managed Medicaid care management team and has been advised to call with any health related questions or concerns.   Aida Raider RN, BSN [place] Management Coordinator - Managed Medicaid High Risk [phone]   Following is a copy of your plan of care:  Care Plan : [place] of Care  Updates made by Gayla Medicus, RN since 09/07/2021 12:00 AM     Problem: Health Promotion or Disease Self-Management (General Plan of Care)      Long-Range Goal: Chronic Disease Management and Care Coordination Needs   Start Date: 09/07/2021  Expected End Date: 12/08/2021  Priority: High  Note:   Current Barriers:  Knowledge Deficits related to plan of care for management of chronic healthcare conditions,  DM, anxiety  Care Coordination needs related to Mental Health Concerns  Chronic Disease Management support and 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   RNCM Clinical Goal(s):  Patient will verbalize understanding of plan for management of DMII and Anxiety as evidenced by patient report verbalize basic understanding of  DMII and Anxiety disease process and self health management plan as evidenced by patient report take all medications exactly as prescribed and will call provider for medication related questions as  evidenced by patient report demonstrate understanding of rationale for each prescribed medication as evidenced by patient report attend all scheduled medical appointments as evidenced by patient report continue to work with RN Care Manager to address care management and care coordination needs related to  DMII and Anxiety as evidenced by adherence to CM Team Scheduled appointments work with Education officer, museum to address  related to the management of Mental Health Concerns  related to the management of Anxiety as evidenced by review of EMR and patient or social worker report through collaboration with Consulting civil engineer, provider, and care team.   Interventions: Inter-disciplinary care team collaboration (see longitudinal plan of care) Evaluation of current treatment plan related to  self management and patient's adherence to plan as established by provider Collaborated with LCSW LCSW referral for anxiety, support, referral  Diabetes Interventions:  (Status:  New goal. and Goal on track:  NO.) Long Term Goal Assessed patient's understanding of A1c goal: <7% Provided education to patient about basic DM disease process Reviewed medications with patient and discussed importance of medication adherence Counseled on importance of regular laboratory monitoring as prescribed Discussed plans with patient for ongoing care management follow up and provided patient with direct contact information for care management team Reviewed scheduled/upcoming provider appointments  Advised patient, providing education and rationale, to check cbg and record, calling provider  for findings outside established parameters Referral made to social work team for assistance with anxiety, stress, referral. Review of patient status, including review of consultants reports, relevant laboratory and other test results, and medications completed Assessed social determinant of health barriers Lab Results  Component Value Date   HGBA1C  11.5 (H) 09/14/2020  Patient Goals/Self-Care Activities: Take all medications as prescribed Attend all scheduled provider appointments Call pharmacy for medication refills 3-7 days in advance of running out of medications Perform all self care activities independently  Perform IADL's (shopping, preparing meals, housekeeping, managing finances) independently Call provider office for new concerns or questions  Work with the social worker to address care coordination needs and will continue to work with the clinical team to address health care and disease management related needs  Follow Up Plan:  The patient has been provided with contact information for the care management team and has been advised to call with any health related questions or concerns.  The care management team will reach out to the patient again over the next 30 business days.

## 2021-09-17 ENCOUNTER — Other Ambulatory Visit: Payer: Self-pay | Admitting: Licensed Clinical Social Worker

## 2021-09-17 NOTE — Patient Outreach (Addendum)
  Medicaid Managed Care Social Work Note  09/17/2021 Name:  Angelica [name] MRN:  [id number] DOB:  1960/02/16  Angelica [name] is an 60 y.o. year old female who is a primary patient of [facility] [place].  The Medicaid Managed Care Coordination team was consulted for assistance with:  [place] and Resources  Angelica [name] was given information about Medicaid Managed Care Coordination team services today. Angelica [name] Patient agreed to services and verbal consent obtained.  Engaged with patient  for by telephone forinitial visit in response to referral for case management and/or care coordination services. However, patient is currently at the hospital as her spouse is about to have surgery and she is wondering if she an reschedule this appointment. Brief emotional support provided and [facility] Social Work appointment successfully rescheduled for this Friday.   Assessments/Interventions:  Review of past medical history, allergies, medications, health status, including review of consultants reports, laboratory and other test data, was performed as part of comprehensive evaluation and provision of chronic care management services.  Follow up:  Patient agrees to Care Plan and Follow-up.  Plan: The Managed Medicaid care management team will reach out to the patient again over the next 7 days.  Date/time of next scheduled Social Work care management/care coordination outreach:  09/21/21 at 9:30 am.  [name], BSW, MSW, LCSW Managed Medicaid LCSW [email or web address] Phone: [phone]

## 2021-09-17 NOTE — Patient Instructions (Signed)
Visit Information  Angelica [name] was given information about Medicaid Managed Care team care coordination services as a part of their [place] Medicaid benefit. [name] verbally consented to engagement with the [facility] Managed Care team.   If you are experiencing a medical emergency, please call 911 or report to your local emergency department or urgent care.   If you have a non-emergency medical problem during routine business hours, please contact your provider's office and ask to speak with a nurse.   For questions related to your [facility], please call: [phone] or visit the homepage here: [email or web address]  If you would like to schedule transportation through your [facility], please call the following number at least 2 days in advance of your appointment: [phone]   Rides for urgent appointments can also be made after hours by calling Member Services.  Call the [place] at [phone], at any time, 24 hours a day, 7 days a week. If you are in danger or need immediate medical attention call 911.  If you would like help to quit smoking, call 1-800-QUIT-NOW 3257959390) OR Espaol: 1-855-Djelo-Ya (3-833-383-2919) o para ms informacin haga clic aqu or Text READY to 200-400 to register via text   The Managed Medicaid care management team will reach out to the patient again over the next 7 days.   [name], BSW, MSW, [facility] Managed Medicaid LCSW [email or web address] Phone: [phone]

## 2021-09-21 ENCOUNTER — Other Ambulatory Visit: Payer: Self-pay | Admitting: Licensed Clinical Social Worker

## 2021-09-21 NOTE — Patient Outreach (Signed)
Medicaid Managed Care Social Work Note  09/21/2021 Name:  Angelica [name] MRN:  [id number] DOB:  Aug 04, 1960  Angelica [name] is an 61 y.o. year old female who is a primary patient of [facility] [place].  The Medicaid Managed Care Coordination team was consulted for assistance with:  [place] and Resources  Ms. Lamica was given information about Medicaid Managed Care Coordination team services today. [name] Patient agreed to services and verbal consent obtained.  Engaged with patient  for by telephone forinitial visit in response to referral for case management and/or care coordination services.   Assessments/Interventions:  Review of past medical history, allergies, medications, health status, including review of consultants reports, laboratory and other test data, was performed as part of comprehensive evaluation and provision of chronic care management services.  SDOH: (Social Determinant of Health) assessments and interventions performed: SDOH Interventions    Flowsheet Row Most Recent Value  SDOH Interventions   Financial Strain Interventions Intervention Not Indicated  Stress Interventions Offered Angelica [name], Provide Counseling       Advanced Directives Status:  See Care Plan for related entries.  Care Plan                 No Known Allergies  Medications Reviewed Today     Reviewed by Greg Cutter, LCSW (Social Worker) on 09/21/21 at [place] List Status: <None>   Medication Order Taking? Sig Documenting Provider Last Dose Status Informant  atorvastatin (LIPITOR) 40 MG tablet [id number]  TAKE ONE TABLET BY MOUTH EVERY DAY AT 6PM Iloabachie, Chioma E, NP  Expired 06/15/21 2359   clotrimazole (CLOTRIMAZOLE-7) 1 % vaginal cream [id number] No Place 1 Applicatorful vaginally at bedtime.  Patient not taking: Reported on 09/07/2021   Angelica [name], [place] Not Taking Active   [facility] Dt 120 MG/ML SOAJ [id number] No Inject 139m (169m  subcutaneously once every 30 days.  Patient not taking: Reported on 09/07/2021    Not Taking Active   hydrochlorothiazide (MICROZIDE) 12.5 MG capsule [id number] ONE CAPSULE BY MOUTH EVERY DAY Iloabachie, Chioma E, NP  Expired 06/15/21 2359   insulin glargine (LANTUS) 100 UNIT/ML Solostar Pen [id number] 43 Units into the skin at bedtime. Iloabachie, Chioma E, NP  Expired 06/20/21 2359   insulin glargine, 1 Unit Dial, (TOUJEO SOLOSTAR) 300 UNIT/ML Solostar Pen [id number] Inject 10 Units under the skin daily.  Patient not taking: Reported on 09/07/2021    Not Taking Active   metFORMIN (GLUCOPHAGE-XR) 500 MG 24 hr tablet [id number] TAKE 2 TABLETS(1,000MG TOTAL) BY MOUTH TWO TIMES DAILY WITH BREAKFAST AND BEFORE SUPPER Iloabachie, Chioma E, NP Taking Expired 11/15/20 2359   miconazole (MONISTAT 7) 2 % vaginal cream [id number] Place 1 applicatorful vaginally at bedtime.  Patient not taking: Reported on 09/07/2021   IlCaryl Asp, NP Not Taking Active   misoprostol (CYTOTEC) 200 MCG tablet [id number] Take 1 tablet (200 mcg total) by mouth once for 1 dose. Take one tablet the night before your surgery. Take the other tablet the morning of your surgery with a small sip of water.  Patient not taking: Reported on 09/07/2021    Not Taking Active   pantoprazole (PROTONIX) 40 MG tablet [id number] Take 1 tablet (40 mg total) by mouth 2 (two) times daily as needed.  Patient not taking: Reported on 09/07/2021   IlCaryl Asp, NP Not Taking Active   Rimegepant Sulfate 75 MG TBDP [id number] Take 75  mg by mouth daily. As needed - do not exceed more than 1 dose in 24 hours  Patient not taking: Reported on 06/15/2020   [provider] Not Taking Active            Med Note Angelica [name]   Thu Jun 15, 2020 11:25 AM) Hasn't started yet  Semaglutide, 1 MG/DOSE, (OZEMPIC, 1 MG/DOSE,) 2 MG/1.5ML SOPN [id number] No Inject 1 mg under the skin every seven (7) days.  Patient not  taking: Reported on 09/07/2021    Not Taking Active   Semaglutide,0.25 or 0.5MG/DOS, (OZEMPIC, 0.25 OR 0.5 MG/DOSE,) 2 MG/1.5ML SOPN [id number] No Inject 0.5 mg under the skin once a week.  Patient not taking: Reported on 09/07/2021    Not Taking Active             Patient Active Problem List   Diagnosis Date Noted   Depression 11/01/2020   Vaginal discharge 06/15/2020   Degenerative arthritis 12/14/2019   Hypercoagulable state ([place]) 10/07/2019   Abnormal laboratory test result 09/28/2019   Tooth loose 08/04/2019   Weakness of right leg 07/20/2019   Lacunar infarct, acute [facility])    Stroke ([place]) 06/14/2019   Headache 10/20/2018   Insomnia 10/20/2018   Anxiety 10/20/2018   Dysuria 10/20/2018   Encounter for screening colonoscopy    Residual hemorrhoidal skin tags    Internal hemorrhoids    Polyp of ascending colon    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   Healthcare maintenance 09/23/2018   Menopausal hot flushes 07/20/2015   GERD (gastroesophageal reflux disease) 01/27/2015   Hypertension 01/27/2015   Hyperlipidemia 01/27/2015   Bell palsy 08/04/2014   Diabetes mellitus without complication ([place]) 29/51/8841    Conditions to be addressed/monitored per PCP order:  Depression  Care Plan : LCSW Plan of Care  Updates made by Greg Cutter, LCSW since 09/21/2021 12:00 AM     Problem: Depression Identification (Depression)      Long-Range Goal: Depressive Symptoms Identified   Start Date: 09/21/2021  This Visit's Progress: On track  Priority: High  Note:   Priority: High  Timeframe:  Long-Range Goal Priority:  High Start Date:   09/21/21               Expected End Date:  ongoing                    Follow Up Date--10/05/21 at 1 pm  - keep 90 percent of scheduled appointments -consider counseling or psychiatry -consider bumping up your self-care  -consider creating a stronger support network   Why is this important?             Beating depression may take some time.            If  you don't feel better right away, don't give up on your treatment plan.    Current barriers:   Chronic Mental Health needs related to depression, stress, grief and anxiety. Patient requires Support, Education, Resources, Referrals, Advocacy, and Care Coordination, in order to meet Unmet Mental Health Needs. Patient will implement clinical interventions discussed today to decrease symptoms of depression and increase knowledge and/or ability of: coping skills. Caregiver to family Mental Health Concerns and Social Isolation Patient lacks knowledge of available community counseling agencies and resources.  Clinical Goal(s): verbalize understanding of plan for management of Anxiety, Depression, and Stress and demonstrate a reduction in symptoms. Patient will connect with a provider for ongoing mental health treatment, increase coping skills, healthy habits,  self-management skills, and stress reduction        Clinical Interventions:  Assessed patient's previous and current treatment, coping skills, support system and barriers to care. Patient and spouse provided hx  Verbalization of feelings encouraged, motivational interviewing employed Emotional support provided, positive coping strategies explored. Establishing healthy boundaries emphasized and healthy self-care education provided Patient was educated on available mental health resources within their area that accept Medicaid and offer counseling and psychiatry. Patient reports only being interested in in person counseling. Patient agreed that she can commute to [place] location for [employer].  Patient reports significant worsening depression Email sent to patient with resource information. Patient unable to complete assessment questions with [facility] LCSW today due to caregiving for her sister during 09/21/21 visit.  Patient is agreeable to referral to [facility] for in person counseling. [facility] LCSW made referral on 09/21/21  Motivational  Interviewing employed Depression screen reviewed  PHQ2/ PHQ9 completed or reviewed  Mindfulness or Relaxation training provided Active listening / Reflection utilized  Advance Care and HCPOA education provided Emotional Support Provided Problem [place] strategies reviewed Provided psychoeducation for mental health needs  Provided brief CBT  Reviewed mental health medications and discussed importance of compliance:  Quality of sleep assessed & Sleep Hygiene techniques promoted  Participation in counseling encouraged  Verbalization of feelings encouraged  Suicidal Ideation/Homicidal Ideation assessed: Patient denies SI/HI  Review resources, discussed options and provided patient information about  [place] care team collaboration (see longitudinal plan of care) Patient Goals/Self-Care Activities: Over the next 120 days Attend scheduled medical appointments Utilize healthy coping skills and supportive resources discussed Contact PCP with any questions or concerns Keep 90 percent of counseling appointments Call your insurance provider for more information about your Enhanced Benefits  Check out counseling resources provided  Begin personal counseling with LCSW, to reduce and manage symptoms of Depression and Stress, until well-established with mental health provider Accept all calls from representative with [facility] solutions in an effort to establish ongoing mental health counseling and supportive services. Incorporate into daily practice - relaxation techniques, deep breathing exercises, and mindfulness meditation strategies. Talk about feelings with friends, family members, spiritual advisor, etc. Contact LCSW directly [phone]), if you have questions, need assistance, or if additional social work needs are identified between now and our next scheduled telephone outreach call. Call [phone] for mental health hotline/crisis line if needed  (24/7 available) Try techniques to reduce symptoms of anxiety/negative thinking (deep breathing, distraction, positive self talk, etc)  - develop a personal safety plan - develop a plan to deal with triggers like holidays, anniversaries - exercise at least 2 to 3 times per week - have a plan for how to handle bad days - journal feelings and what helps to feel better or worse - spend time or talk with others at least 2 to 3 times per week - watch for early signs of feeling worse - begin personal counseling - call and visit an old friend - check out volunteer opportunities - join a support group - laugh; watch a funny movie or comedian - learn and use visualization or guided imagery - perform a random act of kindness - practice relaxation or meditation daily - start or continue a personal journal - practice positive thinking and self-talk -continue with compliance of taking medication  -identify current effective and ineffective coping strategies.  -implement positive self-talk in care to increase self-esteem, confidence and feelings of control.  -consider alternative and complementary therapy approaches  such as meditation, mindfulness or yoga.  -journaling, prayer, worship services, meditation or pastoral counseling.  -increase participation in pleasurable group activities such as hobbies, singing, sports or volunteering).  -consider the use of meditative movement therapy such as tai chi, yoga or qigong.  -start a regular daily exercise program based on tolerance, ability and patient choice to support positive thinking and activity    If you are experiencing a Mental Health or [place] or need someone to talk to, please call the Suicide and Crisis Lifeline: 988   The following coping skill education was provided for stress relief and mental health management: "When your car dies or a deadline looms, how do you respond? Long-term, low-grade or acute stress takes a serious  toll on your body and mind, so don't ignore feelings of constant tension. Stress is a natural part of life. However, too much stress can harm our health, especially if it continues every day. This is chronic stress and can put you at risk for heart problems like heart disease and depression. Understand what's happening inside your body and learn simple coping skills to combat the negative impacts of everyday stressors.  Types of Stress There are two types of stress: Emotional - types of emotional stress are relationship problems, pressure at work, financial worries, experiencing discrimination or having a major life change. Physical - Examples of physical stress include being sick having pain, not sleeping well, recovery from an injury or having an alcohol and drug use disorder. Fight or Flight Sudden or ongoing stress activates your nervous system and floods your bloodstream with adrenaline and cortisol, two hormones that raise blood pressure, increase heart rate and spike blood sugar. These changes pitch your body into a fight or flight response. That enabled our ancestors to outrun saber-toothed tigers, and it's helpful today for situations like dodging a car accident. But most modern chronic stressors, such as finances or a challenging relationship, keep your body in that heightened state, which hurts your health. Effects of Too Much Stress If constantly under stress, most of [place] will eventually start to function less well.  Multiple studies link chronic stress to a higher risk of heart disease, stroke, depression, weight gain, memory loss and even premature death, so it's important to recognize the warning signals. Talk to your doctor about ways to manage stress if you're experiencing any of these symptoms: Prolonged periods of poor sleep. Regular, severe headaches. Unexplained weight loss or gain. Feelings of isolation, withdrawal or worthlessness. Constant anger and irritability. Loss of  interest in activities. Constant worrying or obsessive thinking. Excessive alcohol or drug use. Inability to concentrate.  10 Ways to Cope with Chronic Stress It's key to recognize stressful situations as they occur because it allows you to focus on managing how you react. We all need to know when to close our eyes and take a deep breath when we feel tension rising. Use these tips to prevent or reduce chronic stress. 1. Rebalance Work and Home All work and no play? If you're spending too much time at the office, intentionally put more dates in your calendar to enjoy time for fun, either alone or with others. 2. Get Regular Exercise Moving your body on a regular basis balances the nervous system and increases blood circulation, helping to flush out stress hormones. Even a daily 20-minute walk makes a difference. Any kind of exercise can lower stress and improve your mood ? just pick activities that you enjoy and make it a regular  habit. 3. Eat Well and Limit Alcohol and Stimulants Alcohol, nicotine and caffeine may temporarily relieve stress but have negative health impacts and can make stress worse in the long run. Well-nourished bodies cope better, so start with a good breakfast, add more organic fruits and vegetables for a well-balanced diet, avoid processed foods and sugar, try herbal tea and drink more water. 4. Connect with Supportive People Talking face to face with another person releases hormones that reduce stress. Lean on those good listeners in your life. 5. [place] Time Do you enjoy gardening, reading, listening to music or some other creative pursuit? Engage in activities that bring you pleasure and joy; research shows that reduces stress by almost half and lowers your heart rate, too. 6. Practice Meditation, Stress Reduction or Yoga Relaxation techniques activate a state of restfulness that counterbalances your body's fight-or-flight hormones. Even if this also means a  10-minute break in a long day: listen to music, read, go for a walk in nature, do a hobby, take a bath or spend time with a friend. Also consider doing a mindfulness exercise or try a daily deep breathing or imagery practice. Deep Breathing Slow, calm and deep breathing can help you relax. Try these steps to focus on your breathing and repeat as needed. Find a comfortable position and close your eyes. Exhale and drop your shoulders. Breathe in through your nose; fill your lungs and then your belly. Think of relaxing your body, quieting your mind and becoming calm and peaceful. Breathe out slowly through your nose, relaxing your belly. Think of releasing tension, pain, worries or distress. Repeat steps three and four until you feel relaxed. Imagery This involves using your mind to excite the senses -- sound, vision, smell, taste and feeling. This may help ease your stress. Begin by getting comfortable and then do some slow breathing. Imagine a place you love being at. It could be somewhere from your childhood, somewhere you vacationed or just a place in your imagination. Feel how it is to be in the place you're imagining. Pay attention to the sounds, air, colors, and who is there with you. This is a place where you feel cared for and loved. All is well. You are safe. Take in all the smells, sounds, tastes and feelings. As you do, feel your body being nourished and healed. Feel the calm that surrounds you. Breathe in all the good. Breathe out any discomfort or tension. 7. Sleep Enough If you get less than seven to eight hours of sleep, your body won't tolerate stress as well as it could. If stress keeps you up at night, address the cause, and add extra meditation into your day to make up for the lost z's. Try to get seven to nine hours of sleep each night. Make a regular bedtime schedule. Keep your room dark and cool. Try to avoid computers, TV, cell phones and tablets before bed. 8. Bond with  Connections You Enjoy Go out for a coffee with a friend, chat with a neighbor, call a family member, visit with a clergy member, or even hang out with your pet. Clinical studies show that spending even a short time with a companion animal can cut anxiety levels almost in half. 9. Take a Vacation Getting away from it all can reset your stress tolerance by increasing your mental and emotional outlook, which makes you a happier, more productive person upon return. Leave your cellphone and laptop at home! 10. See a Social worker, Coach or  Therapist If negative thoughts overwhelm your ability to make positive changes, it's time to seek professional help. Make an appointment today--your health and life are worth it."    Patient Goals: Initial goal       Follow up:  Patient agrees to Care Plan and Follow-up.  Plan: The Managed Medicaid care management team will reach out to the patient again over the next 30 days.  Date/time of next scheduled Social Work care management/care coordination outreach:  9/15 at [place], [place], MSW, [place] Medicaid [email or web address] [email or web address] Phone: [phone]

## 2021-09-21 NOTE — Patient Instructions (Signed)
Visit Information  Ms. Swanner was given information about Medicaid Managed Care team care coordination services as a part of their [place] Medicaid benefit. CLEORA KARNIK verbally consented to engagement with the [facility] Managed Care team.   If you are experiencing a medical emergency, please call 911 or report to your local emergency department or urgent care.   If you have a non-emergency medical problem during routine business hours, please contact your provider's office and ask to speak with a nurse.   For questions related to your [facility], please call: [phone] or visit the homepage here: [email or web address]  If you would like to schedule transportation through your [facility], please call the following number at least 2 days in advance of your appointment: [phone]   Rides for urgent appointments can also be made after hours by calling Member Services.  Call the [facility] at [phone], at any time, 24 hours a day, 7 days a week. If you are in danger or need immediate medical attention call 911.  If you would like help to quit smoking, call 1-800-QUIT-NOW 865-842-4304) OR Espaol: 1-855-Djelo-Ya (2-229-798-9211) o para ms informacin haga clic aqu or Text READY to 200-400 to register via text  Following is a copy of your plan of care:  Care Plan : LCSW Plan of Care  Updates made by Greg Cutter, LCSW since 09/21/2021 12:00 AM     Problem: Depression Identification (Depression)      Long-Range Goal: Depressive Symptoms Identified   Start Date: 09/21/2021  This Visit's Progress: On track  Priority: High  Note:   Priority: High  Timeframe:  Long-Range Goal Priority:  High Start Date:   09/21/21               Expected End Date:  ongoing                    Follow Up Date--10/05/21 at 1 pm  - keep 90 percent of  scheduled appointments -consider counseling or psychiatry -consider bumping up your self-care  -consider creating a stronger support network   Why is this important?             Beating depression may take some time.            If you don't feel better right away, don't give up on your treatment plan.    Current barriers:   Chronic Mental Health needs related to depression, stress, grief and anxiety. Patient requires Support, Education, Resources, Referrals, Advocacy, and Care Coordination, in order to meet Unmet Mental Health Needs. Patient will implement clinical interventions discussed today to decrease symptoms of depression and increase knowledge and/or ability of: coping skills. Caregiver to family Mental Health Concerns and Social Isolation Patient lacks knowledge of available community counseling agencies and resources.  Clinical Goal(s): verbalize understanding of plan for management of Anxiety, Depression, and Stress and demonstrate a reduction in symptoms. Patient will connect with a provider for ongoing mental health treatment, increase coping skills, healthy habits, self-management skills, and stress reduction       Patient Goals/Self-Care Activities: Over the next 120 days Attend scheduled medical appointments Utilize healthy coping skills and supportive resources discussed Contact PCP with any questions or concerns Keep 90 percent of counseling appointments Call your insurance provider for more information about your Enhanced Benefits  Check out counseling resources provided  Begin personal counseling with LCSW, to reduce and manage  symptoms of Depression and Stress, until well-established with mental health provider Accept all calls from representative with family solutions in an effort to establish ongoing mental health counseling and supportive services. Incorporate into daily practice - relaxation techniques, deep breathing exercises, and mindfulness meditation  strategies. Talk about feelings with friends, family members, spiritual advisor, etc. Contact LCSW directly [phone]), if you have questions, need assistance, or if additional social work needs are identified between now and our next scheduled telephone outreach call. Call 988 for mental health hotline/crisis line if needed (24/7 available) Try techniques to reduce symptoms of anxiety/negative thinking (deep breathing, distraction, positive self talk, etc)  - develop a personal safety plan - develop a plan to deal with triggers like holidays, anniversaries - exercise at least 2 to 3 times per week - have a plan for how to handle bad days - journal feelings and what helps to feel better or worse - spend time or talk with others at least 2 to 3 times per week - watch for early signs of feeling worse - begin personal counseling - call and visit an old friend - check out volunteer opportunities - join a support group - laugh; watch a funny movie or comedian - learn and use visualization or guided imagery - perform a random act of kindness - practice relaxation or meditation daily - start or continue a personal journal - practice positive thinking and self-talk -continue with compliance of taking medication  -identify current effective and ineffective coping strategies.  -implement positive self-talk in care to increase self-esteem, confidence and feelings of control.  -consider alternative and complementary therapy approaches such as meditation, mindfulness or yoga.  -journaling, prayer, worship services, meditation or pastoral counseling.  -increase participation in pleasurable group activities such as hobbies, singing, sports or volunteering).  -consider the use of meditative movement therapy such as tai chi, yoga or qigong.  -start a regular daily exercise program based on tolerance, ability and patient choice to support positive thinking and activity    If you are experiencing a  Mental Health or [place] or need someone to talk to, please call the Suicide and Crisis Lifeline: 988   The following coping skill education was provided for stress relief and mental health management: "When your car dies or a deadline looms, how do you respond? Long-term, low-grade or acute stress takes a serious toll on your body and mind, so don't ignore feelings of constant tension. Stress is a natural part of life. However, too much stress can harm our health, especially if it continues every day. This is chronic stress and can put you at risk for heart problems like heart disease and depression. Understand what's happening inside your body and learn simple coping skills to combat the negative impacts of everyday stressors.  Types of Stress There are two types of stress: Emotional - types of emotional stress are relationship problems, pressure at work, financial worries, experiencing discrimination or having a major life change. Physical - Examples of physical stress include being sick having pain, not sleeping well, recovery from an injury or having an alcohol and drug use disorder. Fight or Flight Sudden or ongoing stress activates your nervous system and floods your bloodstream with adrenaline and cortisol, two hormones that raise blood pressure, increase heart rate and spike blood sugar. These changes pitch your body into a fight or flight response. That enabled our ancestors to outrun saber-toothed tigers, and it's helpful today for situations like dodging a car accident. But  most modern chronic stressors, such as finances or a challenging relationship, keep your body in that heightened state, which hurts your health. Effects of Too Much Stress If constantly under stress, most of [place] will eventually start to function less well.  Multiple studies link chronic stress to a higher risk of heart disease, stroke, depression, weight gain, memory loss and even premature death, so it's  important to recognize the warning signals. Talk to your doctor about ways to manage stress if you're experiencing any of these symptoms: Prolonged periods of poor sleep. Regular, severe headaches. Unexplained weight loss or gain. Feelings of isolation, withdrawal or worthlessness. Constant anger and irritability. Loss of interest in activities. Constant worrying or obsessive thinking. Excessive alcohol or drug use. Inability to concentrate.  10 Ways to Cope with Chronic Stress It's key to recognize stressful situations as they occur because it allows you to focus on managing how you react. We all need to know when to close our eyes and take a deep breath when we feel tension rising. Use these tips to prevent or reduce chronic stress. 1. Rebalance Work and Home All work and no play? If you're spending too much time at the office, intentionally put more dates in your calendar to enjoy time for fun, either alone or with others. 2. Get Regular Exercise Moving your body on a regular basis balances the nervous system and increases blood circulation, helping to flush out stress hormones. Even a daily 20-minute walk makes a difference. Any kind of exercise can lower stress and improve your mood ? just pick activities that you enjoy and make it a regular habit. 3. Eat Well and Limit Alcohol and Stimulants Alcohol, nicotine and caffeine may temporarily relieve stress but have negative health impacts and can make stress worse in the long run. Well-nourished bodies cope better, so start with a good breakfast, add more organic fruits and vegetables for a well-balanced diet, avoid processed foods and sugar, try herbal tea and drink more water. 4. Connect with Supportive People Talking face to face with another person releases hormones that reduce stress. Lean on those good listeners in your life. 5. [place] Time Do you enjoy gardening, reading, listening to music or some other creative pursuit?  Engage in activities that bring you pleasure and joy; research shows that reduces stress by almost half and lowers your heart rate, too. 6. Practice Meditation, Stress Reduction or Yoga Relaxation techniques activate a state of restfulness that counterbalances your body's fight-or-flight hormones. Even if this also means a 10-minute break in a long day: listen to music, read, go for a walk in nature, do a hobby, take a bath or spend time with a friend. Also consider doing a mindfulness exercise or try a daily deep breathing or imagery practice. Deep Breathing Slow, calm and deep breathing can help you relax. Try these steps to focus on your breathing and repeat as needed. Find a comfortable position and close your eyes. Exhale and drop your shoulders. Breathe in through your nose; fill your lungs and then your belly. Think of relaxing your body, quieting your mind and becoming calm and peaceful. Breathe out slowly through your nose, relaxing your belly. Think of releasing tension, pain, worries or distress. Repeat steps three and four until you feel relaxed. Imagery This involves using your mind to excite the senses -- sound, vision, smell, taste and feeling. This may help ease your stress. Begin by getting comfortable and then do some slow breathing. Imagine a  place you love being at. It could be somewhere from your childhood, somewhere you vacationed or just a place in your imagination. Feel how it is to be in the place you're imagining. Pay attention to the sounds, air, colors, and who is there with you. This is a place where you feel cared for and loved. All is well. You are safe. Take in all the smells, sounds, tastes and feelings. As you do, feel your body being nourished and healed. Feel the calm that surrounds you. Breathe in all the good. Breathe out any discomfort or tension. 7. Sleep Enough If you get less than seven to eight hours of sleep, your body won't tolerate stress as well as it  could. If stress keeps you up at night, address the cause, and add extra meditation into your day to make up for the lost z's. Try to get seven to nine hours of sleep each night. Make a regular bedtime schedule. Keep your room dark and cool. Try to avoid computers, TV, cell phones and tablets before bed. 8. Bond with Connections You Enjoy Go out for a coffee with a friend, chat with a neighbor, call a family member, visit with a clergy member, or even hang out with your pet. Clinical studies show that spending even a short time with a companion animal can cut anxiety levels almost in half. 9. Take a Vacation Getting away from it all can reset your stress tolerance by increasing your mental and emotional outlook, which makes you a happier, more productive person upon return. Leave your cellphone and laptop at home! 10. See a Counselor, Coach or Therapist If negative thoughts overwhelm your ability to make positive changes, it's time to seek professional help. Make an appointment today--your health and life are worth it."    Patient Goals: Initial goal

## 2021-10-05 ENCOUNTER — Other Ambulatory Visit: Payer: Self-pay | Admitting: Licensed Clinical Social Worker

## 2021-10-05 NOTE — Patient Instructions (Signed)
Visit Information  Angelica [name] was given information about Medicaid Managed Care team care coordination services as a part of their [place] Medicaid benefit. Angelica [name] verbally consented to engagement with the [facility] Managed Care team.   If you are experiencing a medical emergency, please call 911 or report to your local emergency department or urgent care.   If you have a non-emergency medical problem during routine business hours, please contact your provider's office and ask to speak with a nurse.   For questions related to your [facility], please call: [phone] or visit the homepage here: [email or web address]  If you would like to schedule transportation through your [employer], please call the following number at least 2 days in advance of your appointment: [phone]   Rides for urgent appointments can also be made after hours by calling Member Services.  Call the [place] at [phone], at any time, 24 hours a day, 7 days a week. If you are in danger or need immediate medical attention call 911.  If you would like help to quit smoking, call 1-800-QUIT-NOW 757-397-9600) OR Espaol: 1-855-Djelo-Ya (1-027-253-6644) o para ms informacin haga clic aqu or Text READY to 200-400 to register via text   Following is a copy of your plan of care:  Care Plan : LCSW Plan of Care  Updates made by Greg Cutter, LCSW since 10/05/2021 12:00 AM     Problem: Depression Identification (Depression)      Long-Range Goal: Depressive Symptoms Identified   Start Date: 09/21/2021  Recent Progress: On track  Priority: High  Note:   Priority: High  Timeframe:  Long-Range Goal Priority:  High Start Date:   09/21/21               Expected End Date:  ongoing                    Follow Up Date--10/19/21 at 1:00 pm  - keep 90 percent of  scheduled appointments -consider counseling or psychiatry -consider bumping up your self-care  -consider creating a stronger support network   Why is this important?             Beating depression may take some time.            If you don't feel better right away, don't give up on your treatment plan.    Current barriers:   Chronic Mental Health needs related to depression, stress, grief and anxiety. Patient requires Support, Education, Resources, Referrals, Advocacy, and Care Coordination, in order to meet Unmet Mental Health Needs. Patient will implement clinical interventions discussed today to decrease symptoms of depression and increase knowledge and/or ability of: coping skills. Caregiver to family Mental Health Concerns and Social Isolation Patient lacks knowledge of available community counseling agencies and resources.  Clinical Goal(s): verbalize understanding of plan for management of Anxiety, Depression, and Stress and demonstrate a reduction in symptoms. Patient will connect with a provider for ongoing mental health treatment, increase coping skills, healthy habits, self-management skills, and stress reduction        Clinical Interventions:  Assessed patient's previous and current treatment, coping skills, support system and barriers to care. Patient and spouse provided hx  Verbalization of feelings encouraged, motivational interviewing employed Emotional support provided, positive coping strategies explored. Establishing healthy boundaries emphasized and healthy self-care education provided Patient was educated on available mental health resources within their area that accept  Medicaid and offer counseling and psychiatry. Patient reports only being interested in in person counseling. Patient agreed that she can commute to [place] location for [employer].  Patient reports significant worsening depression Email sent to patient with resource information. Patient unable to  complete assessment questions with [facility] LCSW today due to caregiving for her sister during 09/21/21 visit.  Patient is agreeable to referral to [facility] for in person counseling. [facility] LCSW made referral on 09/21/21. Email sent to referral coordinator on 9/15 inquiring about status. Patients reports that she has not heard from [facility] yet. She reports she is unable to talk today because her significant other it transferring into a rehab facility today. Brief emotional support provided. [facility] LCSW will follow up within two weeks.   Motivational Interviewing employed Depression screen reviewed  PHQ2/ PHQ9 completed or reviewed  Mindfulness or Relaxation training provided Active listening / Reflection utilized  Advance Care and HCPOA education provided Emotional Support Provided Problem [place] strategies reviewed Provided psychoeducation for mental health needs  Provided brief CBT  Reviewed mental health medications and discussed importance of compliance:  Quality of sleep assessed & Sleep Hygiene techniques promoted  Participation in counseling encouraged  Verbalization of feelings encouraged  Suicidal Ideation/Homicidal Ideation assessed: Patient denies SI/HI  Review resources, discussed options and provided patient information about  [place] care team collaboration (see longitudinal plan of care) Patient Goals/Self-Care Activities: Over the next 120 days Attend scheduled medical appointments Utilize healthy coping skills and supportive resources discussed Contact PCP with any questions or concerns Keep 90 percent of counseling appointments Call your insurance provider for more information about your Enhanced Benefits  Check out counseling resources provided  Begin personal counseling with LCSW, to reduce and manage symptoms of Depression and Stress, until well-established with mental health provider Accept all calls from  representative with [facility] in an effort to establish ongoing mental health counseling and supportive services. Incorporate into daily practice - relaxation techniques, deep breathing exercises, and mindfulness meditation strategies. Talk about feelings with friends, family members, spiritual advisor, etc. Contact LCSW directly [phone]), if you have questions, need assistance, or if additional social work needs are identified between now and our next scheduled telephone outreach call. Call 988 for mental health hotline/crisis line if needed (24/7 available) Try techniques to reduce symptoms of anxiety/negative thinking (deep breathing, distraction, positive self talk, etc)  - develop a personal safety plan - develop a plan to deal with triggers like holidays, anniversaries - exercise at least 2 to 3 times per week - have a plan for how to handle bad days - journal feelings and what helps to feel better or worse - spend time or talk with others at least 2 to 3 times per week - watch for early signs of feeling worse - begin personal counseling - call and visit an old friend - check out volunteer opportunities - join a support group - laugh; watch a funny movie or comedian - learn and use visualization or guided imagery - perform a random act of kindness - practice relaxation or meditation daily - start or continue a personal journal - practice positive thinking and self-talk -continue with compliance of taking medication  -identify current effective and ineffective coping strategies.  -implement positive self-talk in care to increase self-esteem, confidence and feelings of control.  -consider alternative and complementary therapy approaches such as meditation, mindfulness or yoga.  -journaling, prayer, worship services, meditation or pastoral counseling.  -increase participation  in pleasurable group activities such as hobbies, singing, sports or volunteering).  -consider  the use of meditative movement therapy such as tai chi, yoga or qigong.  -start a regular daily exercise program based on tolerance, ability and patient choice to support positive thinking and activity    If you are experiencing a Mental Health or [place] or need someone to talk to, please call the Suicide and Crisis Lifeline: 988   The following coping skill education was provided for stress relief and mental health management: "When your car dies or a deadline looms, how do you respond? Long-term, low-grade or acute stress takes a serious toll on your body and mind, so don't ignore feelings of constant tension. Stress is a natural part of life. However, too much stress can harm our health, especially if it continues every day. This is chronic stress and can put you at risk for heart problems like heart disease and depression. Understand what's happening inside your body and learn simple coping skills to combat the negative impacts of everyday stressors.  Types of Stress There are two types of stress: Emotional - types of emotional stress are relationship problems, pressure at work, financial worries, experiencing discrimination or having a major life change. Physical - Examples of physical stress include being sick having pain, not sleeping well, recovery from an injury or having an alcohol and drug use disorder. Fight or Flight Sudden or ongoing stress activates your nervous system and floods your bloodstream with adrenaline and cortisol, two hormones that raise blood pressure, increase heart rate and spike blood sugar. These changes pitch your body into a fight or flight response. That enabled our ancestors to outrun saber-toothed tigers, and it's helpful today for situations like dodging a car accident. But most modern chronic stressors, such as finances or a challenging relationship, keep your body in that heightened state, which hurts your health. Effects of Too Much Stress If  constantly under stress, most of [place] will eventually start to function less well.  Multiple studies link chronic stress to a higher risk of heart disease, stroke, depression, weight gain, memory loss and even premature death, so it's important to recognize the warning signals. Talk to your doctor about ways to manage stress if you're experiencing any of these symptoms: Prolonged periods of poor sleep. Regular, severe headaches. Unexplained weight loss or gain. Feelings of isolation, withdrawal or worthlessness. Constant anger and irritability. Loss of interest in activities. Constant worrying or obsessive thinking. Excessive alcohol or drug use. Inability to concentrate.  10 Ways to Cope with Chronic Stress It's key to recognize stressful situations as they occur because it allows you to focus on managing how you react. We all need to know when to close our eyes and take a deep breath when we feel tension rising. Use these tips to prevent or reduce chronic stress. 1. Rebalance Work and Home All work and no play? If you're spending too much time at the office, intentionally put more dates in your calendar to enjoy time for fun, either alone or with others. 2. Get Regular Exercise Moving your body on a regular basis balances the nervous system and increases blood circulation, helping to flush out stress hormones. Even a daily 20-minute walk makes a difference. Any kind of exercise can lower stress and improve your mood ? just pick activities that you enjoy and make it a regular habit. 3. Eat Well and Limit Alcohol and Stimulants Alcohol, nicotine and caffeine may temporarily relieve stress but  have negative health impacts and can make stress worse in the long run. Well-nourished bodies cope better, so start with a good breakfast, add more organic fruits and vegetables for a well-balanced diet, avoid processed foods and sugar, try herbal tea and drink more water. 4. Connect with Supportive  People Talking face to face with another person releases hormones that reduce stress. Lean on those good listeners in your life. 5. [place] Time Do you enjoy gardening, reading, listening to music or some other creative pursuit? Engage in activities that bring you pleasure and joy; research shows that reduces stress by almost half and lowers your heart rate, too. 6. Practice Meditation, Stress Reduction or Yoga Relaxation techniques activate a state of restfulness that counterbalances your body's fight-or-flight hormones. Even if this also means a 10-minute break in a long day: listen to music, read, go for a walk in nature, do a hobby, take a bath or spend time with a friend. Also consider doing a mindfulness exercise or try a daily deep breathing or imagery practice. Deep Breathing Slow, calm and deep breathing can help you relax. Try these steps to focus on your breathing and repeat as needed. Find a comfortable position and close your eyes. Exhale and drop your shoulders. Breathe in through your nose; fill your lungs and then your belly. Think of relaxing your body, quieting your mind and becoming calm and peaceful. Breathe out slowly through your nose, relaxing your belly. Think of releasing tension, pain, worries or distress. Repeat steps three and four until you feel relaxed. Imagery This involves using your mind to excite the senses -- sound, vision, smell, taste and feeling. This may help ease your stress. Begin by getting comfortable and then do some slow breathing. Imagine a place you love being at. It could be somewhere from your childhood, somewhere you vacationed or just a place in your imagination. Feel how it is to be in the place you're imagining. Pay attention to the sounds, air, colors, and who is there with you. This is a place where you feel cared for and loved. All is well. You are safe. Take in all the smells, sounds, tastes and feelings. As you do, feel your body  being nourished and healed. Feel the calm that surrounds you. Breathe in all the good. Breathe out any discomfort or tension. 7. Sleep Enough If you get less than seven to eight hours of sleep, your body won't tolerate stress as well as it could. If stress keeps you up at night, address the cause, and add extra meditation into your day to make up for the lost z's. Try to get seven to nine hours of sleep each night. Make a regular bedtime schedule. Keep your room dark and cool. Try to avoid computers, TV, cell phones and tablets before bed. 8. Bond with Connections You Enjoy Go out for a coffee with a friend, chat with a neighbor, call a family member, visit with a clergy member, or even hang out with your pet. Clinical studies show that spending even a short time with a companion animal can cut anxiety levels almost in half. 9. Take a Vacation Getting away from it all can reset your stress tolerance by increasing your mental and emotional outlook, which makes you a happier, more productive person upon return. Leave your cellphone and laptop at home! 10. See a Counselor, Coach or Therapist If negative thoughts overwhelm your ability to make positive changes, it's time to seek professional help. Make  an appointment today--your health and life are worth it."    Patient Goals: follow up goal

## 2021-10-05 NOTE — Patient Outreach (Signed)
Medicaid Managed Care Social Work Note  10/05/2021 Name:  Angelica [name] MRN:  [id number] DOB:  06/20/1960  Angelica [name] is an 61 y.o. year old female who is a primary patient of [facility] [place].  The Medicaid Managed Care Coordination team was consulted for assistance with:  [place] and Resources  Ms. Schiro was given information about Medicaid Managed Care Coordination team services today. [name] Patient agreed to services and verbal consent obtained.  Engaged with patient  for by telephone forfollow up visit in response to referral for case management and/or care coordination services.   Assessments/Interventions:  Review of past medical history, allergies, medications, health status, including review of consultants reports, laboratory and other test data, was performed as part of comprehensive evaluation and provision of chronic care management services.  SDOH: (Social Determinant of Health) assessments and interventions performed: SDOH Interventions    Flowsheet Row Patient Outreach Telephone from 09/21/2021 in [place] Patient Outreach Telephone from 09/07/2021 in [place] Coordination Office Visit from 06/15/2020 in [place] Treatment Planning from 11/03/2019 in [place] from 08/31/2019 in [place] from 10/29/2018 in [place]  SDOH Interventions        Food Insecurity Interventions -- -- Intervention Not Indicated -- -- --  Housing Interventions -- -- Intervention Not Indicated -- -- --  Transportation Interventions -- Intervention Not Indicated Intervention Not Indicated -- -- --  Depression Interventions/Treatment  -- -- -- Currently on Treatment Referral to Psychiatry Referral to Psychiatry  Financial Strain Interventions Intervention Not  Indicated -- -- -- -- --  Stress Interventions Offered [employer], Provide Counseling -- -- -- -- --       Advanced Directives Status:  See Care Plan for related entries.  Care Plan                 No Known Allergies  Medications Reviewed Today     Reviewed by Greg Cutter, LCSW (Social Worker) on 09/21/21 at [place] List Status: <None>   Medication Order Taking? Sig Documenting Provider Last Dose Status Informant  atorvastatin (LIPITOR) 40 MG tablet [id number]  TAKE ONE TABLET BY MOUTH EVERY DAY AT 6PM Iloabachie, Chioma E, NP  Expired 06/15/21 2359   clotrimazole (CLOTRIMAZOLE-7) 1 % vaginal cream [id number] No Place 1 Applicatorful vaginally at bedtime.  Patient not taking: Reported on 09/07/2021   [name], [place] Not Taking Active   [facility] 120 MG/ML SOAJ [id number] No Inject 153m (19m subcutaneously once every 30 days.  Patient not taking: Reported on 09/07/2021    Not Taking Active   hydrochlorothiazide (MICROZIDE) 12.5 MG capsule [id number] ONE CAPSULE BY MOUTH EVERY DAY Iloabachie, Chioma E, NP  Expired 06/15/21 2359   insulin glargine (LANTUS) 100 UNIT/ML Solostar Pen [id number] 43 Units into the skin at bedtime. Iloabachie, Chioma E, NP  Expired 06/20/21 2359   insulin glargine, 1 Unit Dial, (TOUJEO SOLOSTAR) 300 UNIT/ML Solostar Pen [id number] Inject 10 Units under the skin daily.  Patient not taking: Reported on 09/07/2021    Not Taking Active   metFORMIN (GLUCOPHAGE-XR) 500 MG 24 hr tablet [id number] TAKE 2 TABLETS(1,000MG TOTAL) BY MOUTH TWO TIMES DAILY WITH BREAKFAST AND BEFORE SUPPER Iloabachie, Chioma E, NP Taking Expired 11/15/20 2359   miconazole (MONISTAT 7) 2 % vaginal  cream [id number] No Place 1 applicatorful vaginally at bedtime.  Patient not taking: Reported on 09/07/2021   [name], NP Not Taking Active   misoprostol (CYTOTEC) 200 MCG tablet [id number] No Take 1 tablet (200 mcg total) by mouth once for 1 dose.  Take one tablet the night before your surgery. Take the other tablet the morning of your surgery with a small sip of water.  Patient not taking: Reported on 09/07/2021    Not Taking Active   pantoprazole (PROTONIX) 40 MG tablet [id number] No Take 1 tablet (40 mg total) by mouth 2 (two) times daily as needed.  Patient not taking: Reported on 09/07/2021   [name], NP Not Taking Active   Rimegepant Sulfate 75 MG TBDP [id number] No Take 75 mg by mouth daily. As needed - do not exceed more than 1 dose in 24 hours  Patient not taking: Reported on 06/15/2020   [provider] Not Taking Active            Med Note [name]   Thu Jun 15, 2020 11:25 AM) Hasn't started yet  Semaglutide, 1 MG/DOSE, (OZEMPIC, 1 MG/DOSE,) 2 MG/1.5ML SOPN [id number] No Inject 1 mg under the skin every seven (7) days.  Patient not taking: Reported on 09/07/2021    Not Taking Active   Semaglutide,0.25 or 0.5MG/DOS, (OZEMPIC, 0.25 OR 0.5 MG/DOSE,) 2 MG/1.5ML SOPN [id number] No Inject 0.5 mg under the skin once a week.  Patient not taking: Reported on 09/07/2021    Not Taking Active             Patient Active Problem List   Diagnosis Date Noted   Depression 11/01/2020   Vaginal discharge 06/15/2020   Degenerative arthritis 12/14/2019   Hypercoagulable state ([place]) 10/07/2019   Abnormal laboratory test result 09/28/2019   Tooth loose 08/04/2019   Weakness of right leg 07/20/2019   Lacunar infarct, acute [facility])    Stroke ([place]) 06/14/2019   Headache 10/20/2018   Insomnia 10/20/2018   Anxiety 10/20/2018   Dysuria 10/20/2018   Encounter for screening colonoscopy    Residual hemorrhoidal skin tags    Internal hemorrhoids    Polyp of ascending colon    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   Healthcare maintenance 09/23/2018   Menopausal hot flushes 07/20/2015   GERD (gastroesophageal reflux disease) 01/27/2015   Hypertension 01/27/2015   Hyperlipidemia 01/27/2015   Bell palsy 08/04/2014   Diabetes mellitus  without complication ([place]) 33/54/5625    Conditions to be addressed/monitored per PCP order:  Depression  Care Plan : LCSW Plan of Care  Updates made by Greg Cutter, LCSW since 10/05/2021 12:00 AM     Problem: Depression Identification (Depression)      Long-Range Goal: Depressive Symptoms Identified   Start Date: 09/21/2021  Recent Progress: On track  Priority: High  Note:   Priority: High  Timeframe:  Long-Range Goal Priority:  High Start Date:   09/21/21               Expected End Date:  ongoing                    Follow Up Date--10/19/21 at 1:00 pm  - keep 90 percent of scheduled appointments -consider counseling or psychiatry -consider bumping up your self-care  -consider creating a stronger support network   Why is this important?             Beating depression may take some time.  If you don't feel better right away, don't give up on your treatment plan.    Current barriers:   Chronic Mental Health needs related to depression, stress, grief and anxiety. Patient requires Support, Education, Resources, Referrals, Advocacy, and Care Coordination, in order to meet Unmet Mental Health Needs. Patient will implement clinical interventions discussed today to decrease symptoms of depression and increase knowledge and/or ability of: coping skills. Caregiver to family Mental Health Concerns and Social Isolation Patient lacks knowledge of available community counseling agencies and resources.  Clinical Goal(s): verbalize understanding of plan for management of Anxiety, Depression, and Stress and demonstrate a reduction in symptoms. Patient will connect with a provider for ongoing mental health treatment, increase coping skills, healthy habits, self-management skills, and stress reduction        Clinical Interventions:  Assessed patient's previous and current treatment, coping skills, support system and barriers to care. Patient and spouse provided hx  Verbalization of  feelings encouraged, motivational interviewing employed Emotional support provided, positive coping strategies explored. Establishing healthy boundaries emphasized and healthy self-care education provided Patient was educated on available mental health resources within their area that accept Medicaid and offer counseling and psychiatry. Patient reports only being interested in in person counseling. Patient agreed that she can commute to [place] location for [employer].  Patient reports significant worsening depression Email sent to patient with resource information. Patient unable to complete assessment questions with [facility] LCSW today due to caregiving for her sister during 09/21/21 visit.  Patient is agreeable to referral to [facility] for in person counseling. [facility] LCSW made referral on 09/21/21. Email sent to referral coordinator on 9/15 inquiring about status. Patients reports that she has not heard from [facility] yet. She reports she is unable to talk today because her significant other it transferring into a rehab facility today. Brief emotional support provided. [facility] LCSW will follow up within two weeks.   Motivational Interviewing employed Depression screen reviewed  PHQ2/ PHQ9 completed or reviewed  Mindfulness or Relaxation training provided Active listening / Reflection utilized  Advance Care and HCPOA education provided Emotional Support Provided Problem [place] strategies reviewed Provided psychoeducation for mental health needs  Provided brief CBT  Reviewed mental health medications and discussed importance of compliance:  Quality of sleep assessed & Sleep Hygiene techniques promoted  Participation in counseling encouraged  Verbalization of feelings encouraged  Suicidal Ideation/Homicidal Ideation assessed: Patient denies SI/HI  Review resources, discussed options and provided patient information about  [place] care team  collaboration (see longitudinal plan of care) Patient Goals/Self-Care Activities: Over the next 120 days Attend scheduled medical appointments Utilize healthy coping skills and supportive resources discussed Contact PCP with any questions or concerns Keep 90 percent of counseling appointments Call your insurance provider for more information about your Enhanced Benefits  Check out counseling resources provided  Begin personal counseling with LCSW, to reduce and manage symptoms of Depression and Stress, until well-established with mental health provider Accept all calls from representative with [facility] in an effort to establish ongoing mental health counseling and supportive services. Incorporate into daily practice - relaxation techniques, deep breathing exercises, and mindfulness meditation strategies. Talk about feelings with friends, family members, spiritual advisor, etc. Contact LCSW directly [phone]), if you have questions, need assistance, or if additional social work needs are identified between now and our next scheduled telephone outreach call. Call [phone] for mental health hotline/crisis line if needed (24/7 available) Try techniques to reduce symptoms of anxiety/negative  thinking (deep breathing, distraction, positive self talk, etc)  - develop a personal safety plan - develop a plan to deal with triggers like holidays, anniversaries - exercise at least 2 to 3 times per week - have a plan for how to handle bad days - journal feelings and what helps to feel better or worse - spend time or talk with others at least 2 to 3 times per week - watch for early signs of feeling worse - begin personal counseling - call and visit an old friend - check out volunteer opportunities - join a support group - laugh; watch a funny movie or comedian - learn and use visualization or guided imagery - perform a random act of kindness - practice relaxation or meditation daily - start  or continue a personal journal - practice positive thinking and self-talk -continue with compliance of taking medication  -identify current effective and ineffective coping strategies.  -implement positive self-talk in care to increase self-esteem, confidence and feelings of control.  -consider alternative and complementary therapy approaches such as meditation, mindfulness or yoga.  -journaling, prayer, worship services, meditation or pastoral counseling.  -increase participation in pleasurable group activities such as hobbies, singing, sports or volunteering).  -consider the use of meditative movement therapy such as tai chi, yoga or qigong.  -start a regular daily exercise program based on tolerance, ability and patient choice to support positive thinking and activity    If you are experiencing a Mental Health or [place] or need someone to talk to, please call the Suicide and Crisis Lifeline: 988   The following coping skill education was provided for stress relief and mental health management: "When your car dies or a deadline looms, how do you respond? Long-term, low-grade or acute stress takes a serious toll on your body and mind, so don't ignore feelings of constant tension. Stress is a natural part of life. However, too much stress can harm our health, especially if it continues every day. This is chronic stress and can put you at risk for heart problems like heart disease and depression. Understand what's happening inside your body and learn simple coping skills to combat the negative impacts of everyday stressors.  Types of Stress There are two types of stress: Emotional - types of emotional stress are relationship problems, pressure at work, financial worries, experiencing discrimination or having a major life change. Physical - Examples of physical stress include being sick having pain, not sleeping well, recovery from an injury or having an alcohol and drug use  disorder. Fight or Flight Sudden or ongoing stress activates your nervous system and floods your bloodstream with adrenaline and cortisol, two hormones that raise blood pressure, increase heart rate and spike blood sugar. These changes pitch your body into a fight or flight response. That enabled our ancestors to outrun saber-toothed tigers, and it's helpful today for situations like dodging a car accident. But most modern chronic stressors, such as finances or a challenging relationship, keep your body in that heightened state, which hurts your health. Effects of Too Much Stress If constantly under stress, most of [place] will eventually start to function less well.  Multiple studies link chronic stress to a higher risk of heart disease, stroke, depression, weight gain, memory loss and even premature death, so it's important to recognize the warning signals. Talk to your doctor about ways to manage stress if you're experiencing any of these symptoms: Prolonged periods of poor sleep. Regular, severe headaches. Unexplained weight loss or  gain. Feelings of isolation, withdrawal or worthlessness. Constant anger and irritability. Loss of interest in activities. Constant worrying or obsessive thinking. Excessive alcohol or drug use. Inability to concentrate.  10 Ways to Cope with Chronic Stress It's key to recognize stressful situations as they occur because it allows you to focus on managing how you react. We all need to know when to close our eyes and take a deep breath when we feel tension rising. Use these tips to prevent or reduce chronic stress. 1. Rebalance Work and Home All work and no play? If you're spending too much time at the office, intentionally put more dates in your calendar to enjoy time for fun, either alone or with others. 2. Get Regular Exercise Moving your body on a regular basis balances the nervous system and increases blood circulation, helping to flush out stress hormones. Even  a daily 20-minute walk makes a difference. Any kind of exercise can lower stress and improve your mood ? just pick activities that you enjoy and make it a regular habit. 3. Eat Well and Limit Alcohol and Stimulants Alcohol, nicotine and caffeine may temporarily relieve stress but have negative health impacts and can make stress worse in the long run. Well-nourished bodies cope better, so start with a good breakfast, add more organic fruits and vegetables for a well-balanced diet, avoid processed foods and sugar, try herbal tea and drink more water. 4. Connect with Supportive People Talking face to face with another person releases hormones that reduce stress. Lean on those good listeners in your life. 5. [place] Time Do you enjoy gardening, reading, listening to music or some other creative pursuit? Engage in activities that bring you pleasure and joy; research shows that reduces stress by almost half and lowers your heart rate, too. 6. Practice Meditation, Stress Reduction or Yoga Relaxation techniques activate a state of restfulness that counterbalances your body's fight-or-flight hormones. Even if this also means a 10-minute break in a long day: listen to music, read, go for a walk in nature, do a hobby, take a bath or spend time with a friend. Also consider doing a mindfulness exercise or try a daily deep breathing or imagery practice. Deep Breathing Slow, calm and deep breathing can help you relax. Try these steps to focus on your breathing and repeat as needed. Find a comfortable position and close your eyes. Exhale and drop your shoulders. Breathe in through your nose; fill your lungs and then your belly. Think of relaxing your body, quieting your mind and becoming calm and peaceful. Breathe out slowly through your nose, relaxing your belly. Think of releasing tension, pain, worries or distress. Repeat steps three and four until you feel relaxed. Imagery This involves using your mind  to excite the senses -- sound, vision, smell, taste and feeling. This may help ease your stress. Begin by getting comfortable and then do some slow breathing. Imagine a place you love being at. It could be somewhere from your childhood, somewhere you vacationed or just a place in your imagination. Feel how it is to be in the place you're imagining. Pay attention to the sounds, air, colors, and who is there with you. This is a place where you feel cared for and loved. All is well. You are safe. Take in all the smells, sounds, tastes and feelings. As you do, feel your body being nourished and healed. Feel the calm that surrounds you. Breathe in all the good. Breathe out any discomfort or tension. 7.  Sleep Enough If you get less than seven to eight hours of sleep, your body won't tolerate stress as well as it could. If stress keeps you up at night, address the cause, and add extra meditation into your day to make up for the lost z's. Try to get seven to nine hours of sleep each night. Make a regular bedtime schedule. Keep your room dark and cool. Try to avoid computers, TV, cell phones and tablets before bed. 8. Bond with Connections You Enjoy Go out for a coffee with a friend, chat with a neighbor, call a family member, visit with a clergy member, or even hang out with your pet. Clinical studies show that spending even a short time with a companion animal can cut anxiety levels almost in half. 9. Take a Vacation Getting away from it all can reset your stress tolerance by increasing your mental and emotional outlook, which makes you a happier, more productive person upon return. Leave your cellphone and laptop at home! 10. See a Counselor, Coach or Therapist If negative thoughts overwhelm your ability to make positive changes, it's time to seek professional help. Make an appointment today--your health and life are worth it."    Patient Goals: follow up goal      Follow up:  Patient agrees to Care  Plan and Follow-up.  Plan: The Managed Medicaid care management team will reach out to the patient again over the next 30 days.  Date/time of next scheduled Social Work care management/care coordination outreach:  10/19/21 at 1 pm.  [name], [place], MSW, [place] Medicaid LCSW [email or web address] Phone: [phone]

## 2021-10-11 ENCOUNTER — Emergency Department
Admission: EM | Admit: 2021-10-11 | Discharge: 2021-10-11 | Disposition: A | Discharge status: Home / Self Care | Payer: Medicaid Other | Attending: Emergency Medicine | Admitting: Emergency Medicine

## 2021-10-11 ENCOUNTER — Other Ambulatory Visit: Payer: Self-pay

## 2021-10-11 ENCOUNTER — Encounter: Payer: Self-pay | Admitting: Emergency Medicine

## 2021-10-11 ENCOUNTER — Emergency Department: Payer: Medicaid Other

## 2021-10-11 DIAGNOSIS — R509 Fever, unspecified: Secondary | ICD-10-CM | POA: Diagnosis present

## 2021-10-11 DIAGNOSIS — E119 Type 2 diabetes mellitus without complications: Secondary | ICD-10-CM | POA: Diagnosis not present

## 2021-10-11 DIAGNOSIS — I10 Essential (primary) hypertension: Secondary | ICD-10-CM | POA: Insufficient documentation

## 2021-10-11 DIAGNOSIS — U071 COVID-19: Secondary | ICD-10-CM | POA: Diagnosis not present

## 2021-10-11 DIAGNOSIS — E876 Hypokalemia: Secondary | ICD-10-CM | POA: Insufficient documentation

## 2021-10-11 LAB — BASIC METABOLIC PANEL
Anion gap: 8 (ref 5–15)
BUN: 11 mg/dL (ref 6–20)
CO2: 26 mmol/L (ref 22–32)
Calcium: 9.3 mg/dL (ref 8.9–10.3)
Chloride: 102 mmol/L (ref 98–111)
Creatinine, Ser: 0.63 mg/dL (ref 0.44–1.00)
GFR, Estimated: >60 mL/min (ref >60)
Glucose, Bld: 355 mg/dL — ABNORMAL HIGH (ref 70–99)
Potassium: 3.3 mmol/L — ABNORMAL LOW (ref 3.5–5.1)
Sodium: 136 mmol/L (ref 135–145)

## 2021-10-11 LAB — CBC
HCT: 39.9 % (ref 36.0–46.0)
Hemoglobin: 13.2 g/dL (ref 12.0–15.0)
MCH: 26.8 pg (ref 26.0–34.0)
MCHC: 33.1 g/dL (ref 30.0–36.0)
MCV: 80.9 fL (ref 80.0–100.0)
Platelets: 345 10*3/uL (ref 150–400)
RBC: 4.93 MIL/uL (ref 3.87–5.11)
RDW: 13.3 % (ref 11.5–15.5)
WBC: 6.9 10*3/uL (ref 4.0–10.5)
nRBC: 0 % (ref 0.0–0.2)

## 2021-10-11 LAB — RESP PANEL BY RT-PCR (FLU A&B, COVID) ARPGX2
Influenza A by PCR: NEGATIVE
Influenza B by PCR: NEGATIVE
SARS Coronavirus 2 by RT PCR: POSITIVE — AB

## 2021-10-11 LAB — TROPONIN I (HIGH SENSITIVITY): Troponin I (High Sensitivity): 3 ng/L (ref <18)

## 2021-10-11 MED ORDER — POTASSIUM CHLORIDE CRYS ER 20 MEQ PO TBCR
40.0000 meq | EXTENDED_RELEASE_TABLET | Freq: Once | ORAL | Status: AC
  Administered 2021-10-11: 40 meq via ORAL
  Filled 2021-10-11: qty 2

## 2021-10-11 MED ORDER — SODIUM CHLORIDE 0.9 % IV BOLUS
1000.0000 mL | Freq: Once | INTRAVENOUS | Status: AC
  Administered 2021-10-11: 1000 mL via INTRAVENOUS

## 2021-10-11 MED ORDER — NIRMATRELVIR/RITONAVIR (PAXLOVID)TABLET: 3.0000 | ORAL_TABLET | Freq: Two times a day (BID) | ORAL | 0 refills | Status: AC

## 2021-10-11 NOTE — ED Triage Notes (Signed)
Pt to ER with c/o fever, sore throat, headache and body aches since last night.  Pt states achy is mostly in chest.

## 2021-10-11 NOTE — ED Provider Notes (Signed)
[facility] Provider Note    Event Date/Time   First MD Initiated Contact with Patient 10/11/21 1800     (approximate)   History   Chief Complaint Fever, Sore Throat, and Headache   HPI Angelica [name] is a 61 y.o. female, history of diabetes, hypertension, hyperlipidemia, anxiety, bell palsy, depression, to emergency department for evaluation of fever, sore throat, headache, body aches since last night.  She states that she was not overly concerned about it until she began feeling intermittent dull pains in her right upper chest and her left upper chest.  She states she does not feel any pain at this time.  Endorses cough and sinus congestion as well.  Denies any recent contact with other sick individuals.  Denies fever/chills, shortness of breath, abdominal pain, flank pain, nausea/vomiting, dysuria, rash/lesions, dizziness/lightheadedness, or diarrhea.  History Limitations: No limitations.        Physical Exam  Triage Vital Signs: ED Triage Vitals  Enc Vitals Group     BP 10/11/21 1650 (!) 149/94     Pulse Rate 10/11/21 1650 83     Resp 10/11/21 1650 18     Temp 10/11/21 1650 98.4 F (36.9 C)     Temp Source 10/11/21 1650 Oral     SpO2 10/11/21 1650 97 %     Weight 10/11/21 1652 184 lb (83.5 kg)     Height 10/11/21 1652 '5\' 3"'$  (1.6 m)     Head Circumference --      Peak Flow --      Pain Score 10/11/21 1651 7     Pain Loc --      Pain Edu? --      Excl. in [place]? --     Most recent vital signs: Vitals:   10/11/21 1650  BP: (!) 149/94  Pulse: 83  Resp: 18  Temp: 98.4 F (36.9 C)  SpO2: 97%    General: Awake, appears tired, but otherwise stable. Skin: Warm, dry. No rashes or lesions.  Eyes: PERRL. Conjunctivae normal.  CV: Good peripheral perfusion.  Resp: Normal effort.  Lung sounds are clear bilaterally in the apices and bases.  No chest wall tenderness. Abd: Soft, non-tender. No distention.  Neuro: At baseline. No gross  neurological deficits.  Musculoskeletal: Normal ROM of all extremities.   Focused Exam:.Throat exam unremarkable.  No tonsillar swelling or exudates.  Uvula midline.  Physical Exam    ED Results / Procedures / Treatments  Labs (all labs ordered are listed, but only abnormal results are displayed) Labs Reviewed  RESP PANEL BY RT-PCR (FLU A&B, COVID) ARPGX2 - Abnormal; Notable for the following components:      Result Value   SARS Coronavirus 2 by RT PCR POSITIVE (*)    All other components within normal limits  BASIC METABOLIC PANEL - Abnormal; Notable for the following components:   Potassium 3.3 (*)    Glucose, Bld 355 (*)    All other components within normal limits  CBC  POC URINE PREG, ED  TROPONIN I (HIGH SENSITIVITY)  TROPONIN I (HIGH SENSITIVITY)     EKG Sinus rhythm, rate of 81, no ST-segment changes, no AV blocks, no QRS widening, no QT prolongation, no axis deviations.   RADIOLOGY  ED Provider Interpretation: I personally viewed and interpreted this x-ray, no evidence of pneumonia.  DG Chest 2 View  Result Date: 10/11/2021 CLINICAL DATA:  Fever, sore throat, headache EXAM: CHEST - 2 VIEW COMPARISON:  01/02/2019 FINDINGS: Frontal  and lateral views of the chest demonstrate an unremarkable cardiac silhouette. No acute airspace disease, effusion, or pneumothorax. No acute bony abnormalities. IMPRESSION: 1. No acute intrathoracic process. Electronically Signed   By: Randa Ngo M.D.   On: 10/11/2021 17:30    PROCEDURES:  Critical Care performed: N/A.  Procedures    MEDICATIONS ORDERED IN ED: Medications  sodium chloride 0.9 % bolus 1,000 mL (1,000 mLs Intravenous New Bag/Given 10/11/21 1823)  potassium chloride SA (KLOR-CON M) CR tablet 40 mEq (40 mEq Oral Given 10/11/21 1827)     IMPRESSION / MDM / ASSESSMENT AND PLAN / ED COURSE  I reviewed the triage vital signs and the nursing notes.                              Differential diagnosis includes,  but is not limited to, COVID-19, influenza, community-acquired pneumonia, strep pharyngitis, bronchitis, tonsillitis, allergic rhinitis, ACS.  ED Course Patient appears well, vitals are within normal limits.  NAD.  CBC shows no leukocytosis or anemia.  BMP shows a mild hypokalemia at 3.3.  We will provide oral supplementation.  Glucose notably elevated at 355.  She states that she has been taken off her diabetes medications as prescribed.  We will initiate IV fluids.  Initial troponin 3, consistent with previous values.  EKG unremarkable.  Unlikely ACS or myocarditis/pericarditis.  Assessment/Plan Presentation consistent with COVID-19 infection.  Confirmed by PCR.  Chest x-ray shows no evidence of pneumonia.  She appears well clinically.  Lab work-up is been reassuring.  EKG and troponin unremarkable.  Given her comorbidities, will provide her with a prescription for Paxlovid to take starting tomorrow.  Encouraged her to take Tylenol/ibuprofen, as well as other over-the-counter medications as needed for her symptoms.  Recommended follow-up with her primary care provider within the next 3 to 5 days to ensure some improvement in her symptoms.  Will discharge.  Considered admission for this patient, but given her stable presentation and access to close follow-up, she is unlikely to benefit from admission.  Provided the patient with anticipatory guidance, return precautions, and educational material. Encouraged the patient to return to the emergency department at any time if they begin to experience any new or worsening symptoms. Patient expressed understanding and agreed with the plan.   Patient's presentation is most consistent with acute complicated illness / injury requiring diagnostic workup.     FINAL CLINICAL IMPRESSION(S) / ED DIAGNOSES   Final diagnoses:  [id number]     Rx / DC Orders   ED Discharge Orders          Ordered    nirmatrelvir/ritonavir EUA (PAXLOVID) 20 x 150 MG & 10  x '100MG'$  TABS  2 times daily        10/11/21 1848             Note:  This document was prepared using Dragon voice recognition software and may include unintentional dictation errors.   Teodoro Spray, [place] 10/11/21 Marko Stai    Naaman Plummer, MD 10/11/21 2035

## 2021-10-11 NOTE — Discharge Instructions (Addendum)
-  Please take the full course of your Paxlovid as prescribed.  -Follow-up with your primary care provider as needed within the next 3 to 5 days.  -Please avoid contact with others for at least another 4 to 5 days to prevent further spread of the virus.  -Return to the emergency department anytime if you begin to experience any new or worsening symptoms.

## 2021-10-18 ENCOUNTER — Other Ambulatory Visit: Payer: Self-pay | Admitting: Obstetrics and Gynecology

## 2021-10-18 NOTE — Patient Outreach (Signed)
Medicaid Managed Care   Nurse Care Manager Note  10/18/2021 Name:  Angelica [name] MRN:  [id number] DOB:  27-Feb-1960  Angelica [name] is an 61 y.o. year old female who is a primary patient of [facility] [place].  The [facility] Managed Care Coordination team was consulted for assistance with:    Chronic healthcare management needs, HTN, h/o stroke, GERD, DM, arthritis, HLD, H/A, anxiety, depression  Angelica [name] was given information about Medicaid Managed Care Coordination team services today. Angelica [name] Patient agreed to services and verbal consent obtained.  Engaged with patient by telephone for follow up visit in response to provider referral for case management and/or care coordination services.   Assessments/Interventions:  Review of past medical history, allergies, medications, health status, including review of consultants reports, laboratory and other test data, was performed as part of comprehensive evaluation and provision of chronic care management services.  SDOH (Social Determinants of Health) assessments and interventions performed: SDOH Interventions    Flowsheet Row Patient Outreach Telephone from 10/18/2021 in [place] Patient Outreach Telephone from 09/21/2021 in [place] Patient Outreach Telephone from 09/07/2021 in [place] Coordination Office Visit from 06/15/2020 in [place] Treatment Planning from 11/03/2019 in [place] from 08/31/2019 in [place]  SDOH Interventions        Food Insecurity Interventions -- -- -- Intervention Not Indicated -- --  Housing Interventions -- -- -- Intervention Not Indicated -- --  Transportation Interventions -- -- Intervention Not Indicated Intervention Not Indicated -- --  Utilities Interventions Intervention Not Indicated -- -- -- -- --   Alcohol Usage Interventions Intervention Not Indicated (Score <7) -- -- -- -- --  Depression Interventions/Treatment  -- -- -- -- Currently on Treatment Referral to Psychiatry  Financial Strain Interventions -- Intervention Not Indicated -- -- -- --  Stress Interventions -- [employer], Provide Counseling -- -- -- --      Care Plan  No Known Allergies  Medications Reviewed Today     Reviewed by Gayla Medicus, RN (Registered Nurse) on 10/18/21 at [place] List Status: <None>   Medication Order Taking? Sig Documenting Provider Last Dose Status Informant  atorvastatin (LIPITOR) 40 MG tablet [id number]  TAKE ONE TABLET BY MOUTH EVERY DAY AT 6PM Iloabachie, Chioma E, NP  Expired 06/15/21 2359   clotrimazole (CLOTRIMAZOLE-7) 1 % vaginal cream [id number] No Place 1 Applicatorful vaginally at bedtime.  Patient not taking: Reported on 09/07/2021   [name], [place] Not Taking Active   [facility] 120 MG/ML SOAJ [id number] No Inject '120mg'$  (21m) subcutaneously once every 30 days.  Patient not taking: Reported on 09/07/2021    Not Taking Active   hydrochlorothiazide (MICROZIDE) 12.5 MG capsule [id number] TAKE ONE CAPSULE BY MOUTH EVERY DAY Iloabachie, Chioma E, NP  Expired 06/15/21 2359   insulin glargine (LANTUS) 100 UNIT/ML Solostar Pen [id number] Inject 43 Units into the skin at bedtime. Iloabachie, Chioma E, NP  Expired 06/20/21 2359   insulin glargine, 1 Unit Dial, (TOUJEO SOLOSTAR) 300 UNIT/ML Solostar Pen [id number] Inject 10 Units under the skin daily.  Patient not taking: Reported on 09/07/2021    Not Taking Active   metFORMIN (GLUCOPHAGE-XR) 500 MG 24 hr tablet [id number] TAKE 2 TABLETS(1,'000MG'$  TOTAL) BY MOUTH TWO TIMES DAILY WITH BREAKFAST AND BEFORE SUPPER Iloabachie, Chioma E, NP Taking  Expired 11/15/20 2359   miconazole (MONISTAT 7) 2 % vaginal cream [id number] No Place 1 applicatorful vaginally at bedtime.  Patient not taking: Reported on 09/07/2021    [name], NP Not Taking Active   misoprostol (CYTOTEC) 200 MCG tablet [id number] No Take 1 tablet (200 mcg total) by mouth once for 1 dose. Take one tablet the night before your surgery. Take the other tablet the morning of your surgery with a small sip of water.  Patient not taking: Reported on 09/07/2021    Not Taking Active   pantoprazole (PROTONIX) 40 MG tablet [id number] No Take 1 tablet (40 mg total) by mouth 2 (two) times daily as needed.  Patient not taking: Reported on 09/07/2021   [name], NP Not Taking Active   Rimegepant Sulfate 75 MG TBDP [id number] No Take 75 mg by mouth daily. As needed - do not exceed more than 1 dose in 24 hours  Patient not taking: Reported on 06/15/2020   [provider] Not Taking Active            Med Note [name]   Thu Jun 15, 2020 11:25 AM) Hasn't started yet  Semaglutide, 1 MG/DOSE, (OZEMPIC, 1 MG/DOSE,) 2 MG/1.5ML SOPN [id number] No Inject 1 mg under the skin every seven (7) days.  Patient not taking: Reported on 09/07/2021    Not Taking Active   Semaglutide,0.25 or 0.'5MG'$ /DOS, (OZEMPIC, 0.25 OR 0.5 MG/DOSE,) 2 MG/1.5ML SOPN [id number] No Inject 0.5 mg under the skin once a week.  Patient not taking: Reported on 09/07/2021    Not Taking Active            Patient Active Problem List   Diagnosis Date Noted   Depression 11/01/2020   Vaginal discharge 06/15/2020   Degenerative arthritis 12/14/2019   Hypercoagulable state ([place]) 10/07/2019   Abnormal laboratory test result 09/28/2019   Tooth loose 08/04/2019   Weakness of right leg 07/20/2019   Lacunar infarct, acute ([place])    Stroke ([place]) 06/14/2019   Headache 10/20/2018   Insomnia 10/20/2018   Anxiety 10/20/2018   Dysuria 10/20/2018   Encounter for screening colonoscopy    Residual hemorrhoidal skin tags    Internal hemorrhoids    Polyp of ascending colon    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   Healthcare maintenance 09/23/2018   Menopausal hot flushes 07/20/2015    GERD (gastroesophageal reflux disease) 01/27/2015   Hypertension 01/27/2015   Hyperlipidemia 01/27/2015   Bell palsy 08/04/2014   Diabetes mellitus without complication ([place]) 43/32/9518   Conditions to be addressed/monitored per PCP order:  Chronic healthcare management needs, HTN, h/o stroke, GERD, DM, arthritis, HLD, H/A, anxiety, depression  Care Plan : RN Care Manager Plan of Care  Updates made by Gayla Medicus, RN since 10/18/2021 12:00 AM     Problem: Health Promotion or Disease Self-Management (General Plan of Care)      Long-Range Goal: Chronic Disease Management and Care Coordination Needs   Start Date: 09/07/2021  Expected End Date: 12/08/2021  Priority: High  Note:   Current Barriers:  Knowledge Deficits related to plan of care for management of chronic healthcare conditions, DM, anxiety  Care Coordination needs related to Mental Health Concerns  Chronic Disease Management support and 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  Referral placed to [facility] 09/21/21.  Hgb A1C=13 on 10/10/21-medications adjusted.  RNCM Clinical Goal(s):  Patient will verbalize understanding of plan for management of DMII and Anxiety  as evidenced by patient report verbalize basic understanding of  DMII and Anxiety disease process and self health management plan as evidenced by patient report take all medications exactly as prescribed and will call provider for medication related questions as evidenced by patient report demonstrate understanding of rationale for each prescribed medication as evidenced by patient report attend all scheduled medical appointments as evidenced by patient report continue to work with RN Care Manager to address care management and care coordination needs related to  DMII and Anxiety as evidenced by adherence to CM Team Scheduled appointments work with Education officer, museum to address  related to the management  of Mental Health Concerns  related to the management of Anxiety as evidenced by review of EMR and patient or social worker report through collaboration with Consulting civil engineer, provider, and care team.   Interventions: Inter-disciplinary care team collaboration (see longitudinal plan of care) Evaluation of current treatment plan related to  self management and patient's adherence to plan as established by provider Collaborated with LCSW LCSW referral for anxiety, support, referral-completed.  Diabetes Interventions:  (Status:  New goal. and Goal on track:  NO.) Long Term Goal Assessed patient's understanding of A1c goal: <7% Provided education to patient about basic DM disease process Reviewed medications with patient and discussed importance of medication adherence Counseled on importance of regular laboratory monitoring as prescribed Discussed plans with patient for ongoing care management follow up and provided patient with direct contact information for care management team Reviewed scheduled/upcoming provider appointments  Advised patient, providing education and rationale, to check cbg and record, calling provider  for findings outside established parameters Referral made to social work team for assistance with anxiety, stress, referral. Review of patient status, including review of consultants reports, relevant laboratory and other test results, and medications completed Assessed social determinant of health barriers Lab Results  Component Value Date   HGBA1C 11.5 (H) 09/14/2020  HGB A1C=13 on 10/10/21 Patient Goals/Self-Care Activities: Take all medications as prescribed Attend all scheduled provider appointments Call pharmacy for medication refills 3-7 days in advance of running out of medications Perform all self care activities independently  Perform IADL's (shopping, preparing meals, housekeeping, managing finances) independently Call provider office for new concerns or questions   Work with the social worker to address care coordination needs and will continue to work with the clinical team to address health care and disease management related needs  Follow Up Plan:  The patient has been provided with contact information for the care management team and has been advised to call with any health related questions or concerns.  The care management team will reach out to the patient again over the next 30 business  days.    Long-Range Goal: Establish Plan of Care for Chronic Disease Management Needs   Priority: High  Note:   Timeframe:  Long-Range Goal Priority:  High Start Date:     09/07/21                        Expected End Date:     ongoing                  Follow Up Date 11/22/21   - practice safe sex - schedule appointment for flu shot - schedule appointment for vaccines needed due to my age or health - schedule recommended health tests (blood work, mammogram, colonoscopy, pap test) - schedule and keep appointment for annual check-up    Why is this  important?   Screening tests can find diseases early when they are easier to treat.  Your doctor or nurse will talk with you about which tests are important for you.  Getting shots for common diseases like the flu and shingles will help prevent them.   10/18/21:  Patient seen and evaluated by PCP 10/10/21   Follow Up:  Patient agrees to Care Plan and Follow-up.  Plan: The Managed Medicaid care management team will reach out to the patient again over the next 30 business  days. and The  Patient has been provided with contact information for the Managed Medicaid care management team and has been advised to call with any health related questions or concerns.  Date/time of next scheduled RN care management/care coordination outreach:  11/22/21 at 0900.

## 2021-10-18 NOTE — Patient Instructions (Signed)
Visit Information  Angelica [name] was given information about Medicaid Managed Care team care coordination services as a part of their [place] Medicaid benefit. Angelica [name] verbally consented to engagement with the [facility] Managed Care team.   If you are experiencing a medical emergency, please call 911 or report to your local emergency department or urgent care.   If you have a non-emergency medical problem during routine business hours, please contact your provider's office and ask to speak with a nurse.   For questions related to your [facility], please call: [phone] or visit the homepage here: [email or web address]  If you would like to schedule transportation through your [facility], please call the following number at least 2 days in advance of your appointment: [phone]   Rides for urgent appointments can also be made after hours by calling Member Services.  Call the [place] at [phone], at any time, 24 hours a day, 7 days a week. If you are in danger or need immediate medical attention call 911.  If you would like help to quit smoking, call 1-800-QUIT-NOW 520-325-8754) OR Espaol: 1-855-Djelo-Ya (9-163-846-6599) o para ms informacin haga clic aqu or Text READY to 200-400 to register via text  Angelica [name] - following are the goals we discussed in your visit today:   Goals Addressed    Timeframe:  Long-Range Goal Priority:  High Start Date:     09/07/21                        Expected End Date:     ongoing                  Follow Up Date 11/22/21   - practice safe sex - schedule appointment for flu shot - schedule appointment for vaccines needed due to my age or health - schedule recommended health tests (blood work, mammogram, colonoscopy, pap test) - schedule and keep appointment for annual check-up    Why is  this important?   Screening tests can find diseases early when they are easier to treat.  Your doctor or nurse will talk with you about which tests are important for you.  Getting shots for common diseases like the flu and shingles will help prevent them.   10/18/21:  Patient seen and evaluated by PCP 10/10/21  The patient verbalized understanding of instructions, educational materials, and care plan provided today and DECLINED offer to receive copy of patient instructions, educational materials, and care plan.   The Managed Medicaid care management team will reach out to the patient again over the next 30 business  days.  The  Patient has been provided with contact information for the Managed Medicaid care management team and has been advised to call with any health related questions or concerns.   Angelica Raider RN, BSN [place] Management Coordinator - Managed Medicaid High Risk [phone]   Following is a copy of your plan of care:  Care Plan : [place] of Care  Updates made by Gayla Medicus, RN since 10/18/2021 12:00 AM     Problem: Health Promotion or Disease Self-Management (General Plan of Care)      Long-Range Goal: Chronic Disease Management and Care Coordination Needs   Start Date: 09/07/2021  Expected End Date: 12/08/2021  Priority: High  Note:   Current Barriers:  Knowledge Deficits related to plan  of care for management of chronic healthcare conditions, DM, anxiety  Care Coordination needs related to Mental Health Concerns  Chronic Disease Management support and 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  10/18/21-Patient recovering from [place] and feeling better.  Referral placed to [facility] 09/21/21.  Hgb A1C=13 on 10/10/21-medications adjusted.  RNCM Clinical Goal(s):  Patient will verbalize understanding of plan for management of DMII and Anxiety as evidenced by patient report verbalize basic understanding of  DMII  and Anxiety disease process and self health management plan as evidenced by patient report take all medications exactly as prescribed and will call provider for medication related questions as evidenced by patient report demonstrate understanding of rationale for each prescribed medication as evidenced by patient report attend all scheduled medical appointments as evidenced by patient report continue to work with RN Care Manager to address care management and care coordination needs related to  DMII and Anxiety as evidenced by adherence to CM Team Scheduled appointments work with Education officer, museum to address  related to the management of Mental Health Concerns  related to the management of Anxiety as evidenced by review of EMR and patient or social worker report through collaboration with Consulting civil engineer, provider, and care team.   Interventions: Inter-disciplinary care team collaboration (see longitudinal plan of care) Evaluation of current treatment plan related to  self management and patient's adherence to plan as established by provider Collaborated with LCSW LCSW referral for anxiety, support, referral-completed.  Diabetes Interventions:  (Status:  New goal. and Goal on track:  NO.) Long Term Goal Assessed patient's understanding of A1c goal: <7% Provided education to patient about basic DM disease process Reviewed medications with patient and discussed importance of medication adherence Counseled on importance of regular laboratory monitoring as prescribed Discussed plans with patient for ongoing care management follow up and provided patient with direct contact information for care management team Reviewed scheduled/upcoming provider appointments  Advised patient, providing education and rationale, to check cbg and record, calling provider  for findings outside established parameters Referral made to social work team for assistance with anxiety, stress, referral. Review of patient status,  including review of consultants reports, relevant laboratory and other test results, and medications completed Assessed social determinant of health barriers Lab Results  Component Value Date   HGBA1C 11.5 (H) 09/14/2020  HGB A1C=13 on 10/10/21 Patient Goals/Self-Care Activities: Take all medications as prescribed Attend all scheduled provider appointments Call pharmacy for medication refills 3-7 days in advance of running out of medications Perform all self care activities independently  Perform IADL's (shopping, preparing meals, housekeeping, managing finances) independently Call provider office for new concerns or questions  Work with the social worker to address care coordination needs and will continue to work with the clinical team to address health care and disease management related needs  Follow Up Plan:  The patient has been provided with contact information for the care management team and has been advised to call with any health related questions or concerns.  The care management team will reach out to the patient again over the next 30 business days.

## 2021-10-19 ENCOUNTER — Other Ambulatory Visit: Payer: Self-pay | Admitting: Licensed Clinical Social Worker

## 2021-10-19 NOTE — Patient Instructions (Signed)
Visit Information  Ms. Mallo was given information about Medicaid Managed Care team care coordination services as a part of their [place] Medicaid benefit. CAILIE BOSSHART verbally consented to engagement with the [facility] Managed Care team.   If you are experiencing a medical emergency, please call 911 or report to your local emergency department or urgent care.   If you have a non-emergency medical problem during routine business hours, please contact your provider's office and ask to speak with a nurse.   For questions related to your [facility], please call: [phone] or visit the homepage here: [email or web address]  If you would like to schedule transportation through your [facility], please call the following number at least 2 days in advance of your appointment: [phone]   Rides for urgent appointments can also be made after hours by calling Member Services.  Call the [place] at [phone], at any time, 24 hours a day, 7 days a week. If you are in danger or need immediate medical attention call 911.  If you would like help to quit smoking, call 1-800-QUIT-NOW 608-860-1803) OR Espaol: 1-855-Djelo-Ya (8-882-800-3491) o para ms informacin haga clic aqu or Text READY to 200-400 to register via text   Following is a copy of your plan of care:  Care Plan : LCSW Plan of Care  Updates made by Greg Cutter, LCSW since 10/19/2021 12:00 AM     Problem: Depression Identification (Depression)      Long-Range Goal: Depressive Symptoms Identified   Start Date: 09/21/2021  Recent Progress: On track  Priority: High  Note:   Priority: High  Timeframe:  Long-Range Goal Priority:  High Start Date:   09/21/21               Expected End Date:  ongoing                    Follow Up Date--11/09/21 at 230 pm  - keep 90 percent of  scheduled appointments -consider counseling or psychiatry -consider bumping up your self-care  -consider creating a stronger support network   Why is this important?             Beating depression may take some time.            If you don't feel better right away, don't give up on your treatment plan.    Current barriers:   Chronic Mental Health needs related to depression, stress, grief and anxiety. Patient requires Support, Education, Resources, Referrals, Advocacy, and Care Coordination, in order to meet Unmet Mental Health Needs. Patient will implement clinical interventions discussed today to decrease symptoms of depression and increase knowledge and/or ability of: coping skills. Caregiver to family Mental Health Concerns and Social Isolation Patient lacks knowledge of available community counseling agencies and resources.  Clinical Goal(s): verbalize understanding of plan for management of Anxiety, Depression, and Stress and demonstrate a reduction in symptoms. Patient will connect with a provider for ongoing mental health treatment, increase coping skills, healthy habits, self-management skills, and stress reduction        Patient Goals/Self-Care Activities: Over the next 120 days Attend scheduled medical appointments Utilize healthy coping skills and supportive resources discussed Contact PCP with any questions or concerns Keep 90 percent of counseling appointments Call your insurance provider for more information about your Enhanced Benefits  Check out counseling resources provided  Begin personal counseling with LCSW, to reduce and  manage symptoms of Depression and Stress, until well-established with mental health provider Accept all calls from representative with family solutions in an effort to establish ongoing mental health counseling and supportive services. Incorporate into daily practice - relaxation techniques, deep breathing exercises, and mindfulness meditation  strategies. Talk about feelings with friends, family members, spiritual advisor, etc. Contact LCSW directly [phone]), if you have questions, need assistance, or if additional social work needs are identified between now and our next scheduled telephone outreach call. Call 988 for mental health hotline/crisis line if needed (24/7 available) Try techniques to reduce symptoms of anxiety/negative thinking (deep breathing, distraction, positive self talk, etc)  - develop a personal safety plan - develop a plan to deal with triggers like holidays, anniversaries - exercise at least 2 to 3 times per week - have a plan for how to handle bad days - journal feelings and what helps to feel better or worse - spend time or talk with others at least 2 to 3 times per week - watch for early signs of feeling worse - begin personal counseling - call and visit an old friend - check out volunteer opportunities - join a support group - laugh; watch a funny movie or comedian - learn and use visualization or guided imagery - perform a random act of kindness - practice relaxation or meditation daily - start or continue a personal journal - practice positive thinking and self-talk -continue with compliance of taking medication  -identify current effective and ineffective coping strategies.  -implement positive self-talk in care to increase self-esteem, confidence and feelings of control.  -consider alternative and complementary therapy approaches such as meditation, mindfulness or yoga.  -journaling, prayer, worship services, meditation or pastoral counseling.  -increase participation in pleasurable group activities such as hobbies, singing, sports or volunteering).  -consider the use of meditative movement therapy such as tai chi, yoga or qigong.  -start a regular daily exercise program based on tolerance, ability and patient choice to support positive thinking and activity    If you are experiencing a  Mental Health or [place] or need someone to talk to, please call the Suicide and Crisis Lifeline: 988   The following coping skill education was provided for stress relief and mental health management: "When your car dies or a deadline looms, how do you respond? Long-term, low-grade or acute stress takes a serious toll on your body and mind, so don't ignore feelings of constant tension. Stress is a natural part of life. However, too much stress can harm our health, especially if it continues every day. This is chronic stress and can put you at risk for heart problems like heart disease and depression. Understand what's happening inside your body and learn simple coping skills to combat the negative impacts of everyday stressors.  Types of Stress There are two types of stress: Emotional - types of emotional stress are relationship problems, pressure at work, financial worries, experiencing discrimination or having a major life change. Physical - Examples of physical stress include being sick having pain, not sleeping well, recovery from an injury or having an alcohol and drug use disorder. Fight or Flight Sudden or ongoing stress activates your nervous system and floods your bloodstream with adrenaline and cortisol, two hormones that raise blood pressure, increase heart rate and spike blood sugar. These changes pitch your body into a fight or flight response. That enabled our ancestors to outrun saber-toothed tigers, and it's helpful today for situations like dodging a car accident.  But most modern chronic stressors, such as finances or a challenging relationship, keep your body in that heightened state, which hurts your health. Effects of Too Much Stress If constantly under stress, most of [place] will eventually start to function less well.  Multiple studies link chronic stress to a higher risk of heart disease, stroke, depression, weight gain, memory loss and even premature death, so it's  important to recognize the warning signals. Talk to your doctor about ways to manage stress if you're experiencing any of these symptoms: Prolonged periods of poor sleep. Regular, severe headaches. Unexplained weight loss or gain. Feelings of isolation, withdrawal or worthlessness. Constant anger and irritability. Loss of interest in activities. Constant worrying or obsessive thinking. Excessive alcohol or drug use. Inability to concentrate.  10 Ways to Cope with Chronic Stress It's key to recognize stressful situations as they occur because it allows you to focus on managing how you react. We all need to know when to close our eyes and take a deep breath when we feel tension rising. Use these tips to prevent or reduce chronic stress. 1. Rebalance Work and Home All work and no play? If you're spending too much time at the office, intentionally put more dates in your calendar to enjoy time for fun, either alone or with others. 2. Get Regular Exercise Moving your body on a regular basis balances the nervous system and increases blood circulation, helping to flush out stress hormones. Even a daily 20-minute walk makes a difference. Any kind of exercise can lower stress and improve your mood ? just pick activities that you enjoy and make it a regular habit. 3. Eat Well and Limit Alcohol and Stimulants Alcohol, nicotine and caffeine may temporarily relieve stress but have negative health impacts and can make stress worse in the long run. Well-nourished bodies cope better, so start with a good breakfast, add more organic fruits and vegetables for a well-balanced diet, avoid processed foods and sugar, try herbal tea and drink more water. 4. Connect with Supportive People Talking face to face with another person releases hormones that reduce stress. Lean on those good listeners in your life. 5. [place]ren Time Do you enjoy gardening, reading, listening to music or some other creative pursuit?  Engage in activities that bring you pleasure and joy; research shows that reduces stress by almost half and lowers your heart rate, too. 6. Practice Meditation, Stress Reduction or Yoga Relaxation techniques activate a state of restfulness that counterbalances your body's fight-or-flight hormones. Even if this also means a 10-minute break in a long day: listen to music, read, go for a walk in nature, do a hobby, take a bath or spend time with a friend. Also consider doing a mindfulness exercise or try a daily deep breathing or imagery practice. Deep Breathing Slow, calm and deep breathing can help you relax. Try these steps to focus on your breathing and repeat as needed. Find a comfortable position and close your eyes. Exhale and drop your shoulders. Breathe in through your nose; fill your lungs and then your belly. Think of relaxing your body, quieting your mind and becoming calm and peaceful. Breathe out slowly through your nose, relaxing your belly. Think of releasing tension, pain, worries or distress. Repeat steps three and four until you feel relaxed. Imagery This involves using your mind to excite the senses -- sound, vision, smell, taste and feeling. This may help ease your stress. Begin by getting comfortable and then do some slow breathing. Imagine  a place you love being at. It could be somewhere from your childhood, somewhere you vacationed or just a place in your imagination. Feel how it is to be in the place you're imagining. Pay attention to the sounds, air, colors, and who is there with you. This is a place where you feel cared for and loved. All is well. You are safe. Take in all the smells, sounds, tastes and feelings. As you do, feel your body being nourished and healed. Feel the calm that surrounds you. Breathe in all the good. Breathe out any discomfort or tension. 7. Sleep Enough If you get less than seven to eight hours of sleep, your body won't tolerate stress as well as it  could. If stress keeps you up at night, address the cause, and add extra meditation into your day to make up for the lost z's. Try to get seven to nine hours of sleep each night. Make a regular bedtime schedule. Keep your room dark and cool. Try to avoid computers, TV, cell phones and tablets before bed. 8. Bond with Connections You Enjoy Go out for a coffee with a friend, chat with a neighbor, call a family member, visit with a clergy member, or even hang out with your pet. Clinical studies show that spending even a short time with a companion animal can cut anxiety levels almost in half. 9. Take a Vacation Getting away from it all can reset your stress tolerance by increasing your mental and emotional outlook, which makes you a happier, more productive person upon return. Leave your cellphone and laptop at home! 10. See a Counselor, Coach or Therapist If negative thoughts overwhelm your ability to make positive changes, it's time to seek professional help. Make an appointment today--your health and life are worth it."    Patient Goals: follow up goal

## 2021-10-19 NOTE — Patient Outreach (Signed)
Medicaid Managed Care Social Work Note  10/19/2021 Name:  Angelica [name] MRN:  [id number] DOB:  20-Aug-1960  Angelica [name] is an 61 y.o. year old female who is a primary patient of [facility].  The Medicaid Managed Care Coordination team was consulted for assistance with:  Tool and Resources  [name] was given information about Medicaid Managed Care Coordination team services today. [name] Patient agreed to services and verbal consent obtained.  Engaged with patient  for by telephone forfollow up visit in response to referral for case management and/or care coordination services.   Assessments/Interventions:  Review of past medical history, allergies, medications, health status, including review of consultants reports, laboratory and other test data, was performed as part of comprehensive evaluation and provision of chronic care management services.  SDOH: (Social Determinant of Health) assessments and interventions performed: SDOH Interventions    Flowsheet Row Patient Outreach Telephone from 10/18/2021 in [place] Patient Outreach Telephone from 09/21/2021 in [place] Patient Outreach Telephone from 09/07/2021 in [place] Coordination Office Visit from 06/15/2020 in [place] Treatment Planning from 11/03/2019 in [place] from 08/31/2019 in [place]  SDOH Interventions        Food Insecurity Interventions -- -- -- Intervention Not Indicated -- --  Housing Interventions -- -- -- Intervention Not Indicated -- --  Transportation Interventions -- -- Intervention Not Indicated Intervention Not Indicated -- --  Utilities Interventions Intervention Not Indicated -- -- -- -- --  Alcohol Usage Interventions Intervention Not Indicated (Score <7) -- -- -- -- --   Depression Interventions/Treatment  -- -- -- -- Currently on Treatment Referral to Psychiatry  Financial Strain Interventions -- Intervention Not Indicated -- -- -- --  Stress Interventions -- [employer], Provide Counseling -- -- -- --       Advanced Directives Status:  See Care Plan for related entries.  Care Plan                 No Known Allergies  Medications Reviewed Today     Reviewed by Gayla Medicus, RN (Registered Nurse) on 10/18/21 at 34  Med List Status: <None>   Medication Order Taking? Sig Documenting Provider Last Dose Status Informant  atorvastatin (LIPITOR) 40 MG tablet [id number]  TAKE ONE TABLET BY MOUTH EVERY DAY AT 6PM Iloabachie, Chioma E, NP  Expired 06/15/21 2359   clotrimazole (CLOTRIMAZOLE-7) 1 % vaginal cream [id number] No Place 1 Applicatorful vaginally at bedtime.  Patient not taking: Reported on 09/07/2021   [name], [place] Not Taking Active   [facility] 120 MG/ML SOAJ [id number] No Inject 140m (171m subcutaneously once every 30 days.  Patient not taking: Reported on 09/07/2021    Not Taking Active   hydrochlorothiazide (MICROZIDE) 12.5 MG capsule [id number] ONE CAPSULE BY MOUTH EVERY DAY Iloabachie, Chioma E, NP  Expired 06/15/21 2359   insulin glargine (LANTUS) 100 UNIT/ML Solostar Pen [id number] 43 Units into the skin at bedtime. Iloabachie, Chioma E, NP  Expired 06/20/21 2359   insulin glargine, 1 Unit Dial, (TOUJEO SOLOSTAR) 300 UNIT/ML Solostar Pen [id number] Inject 10 Units under the skin daily.  Patient not taking: Reported on 09/07/2021    Not Taking Active   metFORMIN (GLUCOPHAGE-XR) 500 MG 24 hr tablet [id number] TAKE 2 TABLETS(1,000MG TOTAL) BY MOUTH  TWO TIMES DAILY WITH BREAKFAST AND BEFORE SUPPER Iloabachie, Chioma E, NP Taking Expired 11/15/20 2359   miconazole (MONISTAT 7) 2 % vaginal cream [id number] No Place 1 applicatorful vaginally at bedtime.  Patient not taking: Reported on 09/07/2021    [name], NP Not Taking Active   misoprostol (CYTOTEC) 200 MCG tablet [id number] No Take 1 tablet (200 mcg total) by mouth once for 1 dose. Take one tablet the night before your surgery. Take the other tablet the morning of your surgery with a small sip of water.  Patient not taking: Reported on 09/07/2021    Not Taking Active   pantoprazole (PROTONIX) 40 MG tablet [id number] No Take 1 tablet (40 mg total) by mouth 2 (two) times daily as needed.  Patient not taking: Reported on 09/07/2021   [name], NP Not Taking Active   Rimegepant Sulfate 75 MG TBDP [id number] No Take 75 mg by mouth daily. As needed - do not exceed more than 1 dose in 24 hours  Patient not taking: Reported on 06/15/2020   [provider] Not Taking Active            Med Note [name]   Thu Jun 15, 2020 11:25 AM) Hasn't started yet  Semaglutide, 1 MG/DOSE, (OZEMPIC, 1 MG/DOSE,) 2 MG/1.5ML SOPN [id number] No Inject 1 mg under the skin every seven (7) days.  Patient not taking: Reported on 09/07/2021    Not Taking Active   Semaglutide,0.25 or 0.5MG/DOS, (OZEMPIC, 0.25 OR 0.5 MG/DOSE,) 2 MG/1.5ML SOPN [id number] No Inject 0.5 mg under the skin once a week.  Patient not taking: Reported on 09/07/2021    Not Taking Active             Patient Active Problem List   Diagnosis Date Noted   Depression 11/01/2020   Vaginal discharge 06/15/2020   Degenerative arthritis 12/14/2019   Hypercoagulable state ([place]) 10/07/2019   Abnormal laboratory test result 09/28/2019   Tooth loose 08/04/2019   Weakness of right leg 07/20/2019   Lacunar infarct, acute [facility])    Stroke ([place]) 06/14/2019   Headache 10/20/2018   Insomnia 10/20/2018   Anxiety 10/20/2018   Dysuria 10/20/2018   Encounter for screening colonoscopy    Residual hemorrhoidal skin tags    Internal hemorrhoids    Polyp of ascending colon    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   Healthcare maintenance 09/23/2018   Menopausal hot flushes 07/20/2015    GERD (gastroesophageal reflux disease) 01/27/2015   Hypertension 01/27/2015   Hyperlipidemia 01/27/2015   Bell palsy 08/04/2014   Diabetes mellitus without complication ([place]) 63/81/7711    Conditions to be addressed/monitored per PCP order:  Depression  Care Plan : LCSW Plan of Care  Updates made by Greg Cutter, LCSW since 10/19/2021 12:00 AM     Problem: Depression Identification (Depression)      Long-Range Goal: Depressive Symptoms Identified   Start Date: 09/21/2021  Recent Progress: On track  Priority: High  Note:   Priority: High  Timeframe:  Long-Range Goal Priority:  High Start Date:   09/21/21               Expected End Date:  ongoing                    Follow Up Date--11/09/21 at 230 pm  - keep 90 percent of scheduled appointments -consider counseling or psychiatry -consider bumping up your self-care  -consider creating a stronger support network   Why is this  important?             Beating depression may take some time.            If you don't feel better right away, don't give up on your treatment plan.    Current barriers:   Chronic Mental Health needs related to depression, stress, grief and anxiety. Patient requires Support, Education, Resources, Referrals, Advocacy, and Care Coordination, in order to meet Unmet Mental Health Needs. Patient will implement clinical interventions discussed today to decrease symptoms of depression and increase knowledge and/or ability of: coping skills. Caregiver to family Mental Health Concerns and Social Isolation Patient lacks knowledge of available community counseling agencies and resources.  Clinical Goal(s): verbalize understanding of plan for management of Anxiety, Depression, and Stress and demonstrate a reduction in symptoms. Patient will connect with a provider for ongoing mental health treatment, increase coping skills, healthy habits, self-management skills, and stress reduction        Clinical Interventions:   Assessed patient's previous and current treatment, coping skills, support system and barriers to care. Patient and spouse provided hx  Verbalization of feelings encouraged, motivational interviewing employed Emotional support provided, positive coping strategies explored. Establishing healthy boundaries emphasized and healthy self-care education provided Patient was educated on available mental health resources within their area that accept Medicaid and offer counseling and psychiatry. Patient reports only being interested in in person counseling. Patient agreed that she can commute to [place] location for [employer].  Patient reports significant worsening depression Email sent to patient with resource information. Patient unable to complete assessment questions with [facility] LCSW today due to caregiving for her sister during 09/21/21 visit.  Patient is agreeable to referral to [facility] for in person counseling. [facility] LCSW made referral on 09/21/21. Email sent to referral coordinator on 9/15 inquiring about status. Patients reports that she has not heard from [facility] yet. She reports she is unable to talk today because her significant other it transferring into a rehab facility today. Brief emotional support provided. [facility] LCSW will follow up within two weeks. UPDATE- Patient confirms that she received intake paperwork from [facility] but she did not receive a phone call. She reports that she will follow up on this next week as she is currently recovering from [place] and assisting with care taking for her boyfriend who recently was discharged from the hospital. Patient reports that her main focus now is getting herself and her boyfriend better. [facility] LCSW provided emotional support and reflective listening. Patient is agreeable to a 3 week follow up call which will allow her time to work on her intake paperwork for [facility] so that she can start therapy.   Motivational Interviewing  employed Depression screen reviewed  PHQ2/ PHQ9 completed or reviewed  Mindfulness or Relaxation training provided Active listening / Reflection utilized  Advance Care and HCPOA education provided Emotional Support Provided Problem [place] strategies reviewed Provided psychoeducation for mental health needs  Provided brief CBT  Reviewed mental health medications and discussed importance of compliance:  Quality of sleep assessed & Sleep Hygiene techniques promoted  Participation in counseling encouraged  Verbalization of feelings encouraged  Suicidal Ideation/Homicidal Ideation assessed: Patient denies SI/HI  Review resources, discussed options and provided patient information about  [place] care team collaboration (see longitudinal plan of care) Patient Goals/Self-Care Activities: Over the next 120 days Attend scheduled medical appointments Utilize healthy coping skills and supportive resources discussed Contact PCP with any questions or  concerns Keep 90 percent of counseling appointments Call your insurance provider for more information about your Enhanced Benefits  Check out counseling resources provided  Begin personal counseling with LCSW, to reduce and manage symptoms of Depression and Stress, until well-established with mental health provider Accept all calls from representative with [facility] solutions in an effort to establish ongoing mental health counseling and supportive services. Incorporate into daily practice - relaxation techniques, deep breathing exercises, and mindfulness meditation strategies. Talk about feelings with friends, family members, spiritual advisor, etc. Contact LCSW directly [phone]), if you have questions, need assistance, or if additional social work needs are identified between now and our next scheduled telephone outreach call. Call 988 for mental health hotline/crisis line if needed (24/7  available) Try techniques to reduce symptoms of anxiety/negative thinking (deep breathing, distraction, positive self talk, etc)  - develop a personal safety plan - develop a plan to deal with triggers like holidays, anniversaries - exercise at least 2 to 3 times per week - have a plan for how to handle bad days - journal feelings and what helps to feel better or worse - spend time or talk with others at least 2 to 3 times per week - watch for early signs of feeling worse - begin personal counseling - call and visit an old friend - check out volunteer opportunities - join a support group - laugh; watch a funny movie or comedian - learn and use visualization or guided imagery - perform a random act of kindness - practice relaxation or meditation daily - start or continue a personal journal - practice positive thinking and self-talk -continue with compliance of taking medication  -identify current effective and ineffective coping strategies.  -implement positive self-talk in care to increase self-esteem, confidence and feelings of control.  -consider alternative and complementary therapy approaches such as meditation, mindfulness or yoga.  -journaling, prayer, worship services, meditation or pastoral counseling.  -increase participation in pleasurable group activities such as hobbies, singing, sports or volunteering).  -consider the use of meditative movement therapy such as tai chi, yoga or qigong.  -start a regular daily exercise program based on tolerance, ability and patient choice to support positive thinking and activity    If you are experiencing a Mental Health or [place] or need someone to talk to, please call the Suicide and Crisis Lifeline: 988   The following coping skill education was provided for stress relief and mental health management: "When your car dies or a deadline looms, how do you respond? Long-term, low-grade or acute stress takes a serious toll  on your body and mind, so don't ignore feelings of constant tension. Stress is a natural part of life. However, too much stress can harm our health, especially if it continues every day. This is chronic stress and can put you at risk for heart problems like heart disease and depression. Understand what's happening inside your body and learn simple coping skills to combat the negative impacts of everyday stressors.  Types of Stress There are two types of stress: Emotional - types of emotional stress are relationship problems, pressure at work, financial worries, experiencing discrimination or having a major life change. Physical - Examples of physical stress include being sick having pain, not sleeping well, recovery from an injury or having an alcohol and drug use disorder. Fight or Flight Sudden or ongoing stress activates your nervous system and floods your bloodstream with adrenaline and cortisol, two hormones that raise blood pressure, increase heart rate and  spike blood sugar. These changes pitch your body into a fight or flight response. That enabled our ancestors to outrun saber-toothed tigers, and it's helpful today for situations like dodging a car accident. But most modern chronic stressors, such as finances or a challenging relationship, keep your body in that heightened state, which hurts your health. Effects of Too Much Stress If constantly under stress, most of [place] will eventually start to function less well.  Multiple studies link chronic stress to a higher risk of heart disease, stroke, depression, weight gain, memory loss and even premature death, so it's important to recognize the warning signals. Talk to your doctor about ways to manage stress if you're experiencing any of these symptoms: Prolonged periods of poor sleep. Regular, severe headaches. Unexplained weight loss or gain. Feelings of isolation, withdrawal or worthlessness. Constant anger and irritability. Loss of interest  in activities. Constant worrying or obsessive thinking. Excessive alcohol or drug use. Inability to concentrate.  10 Ways to Cope with Chronic Stress It's key to recognize stressful situations as they occur because it allows you to focus on managing how you react. We all need to know when to close our eyes and take a deep breath when we feel tension rising. Use these tips to prevent or reduce chronic stress. 1. Rebalance Work and Home All work and no play? If you're spending too much time at the office, intentionally put more dates in your calendar to enjoy time for fun, either alone or with others. 2. Get Regular Exercise Moving your body on a regular basis balances the nervous system and increases blood circulation, helping to flush out stress hormones. Even a daily 20-minute walk makes a difference. Any kind of exercise can lower stress and improve your mood ? just pick activities that you enjoy and make it a regular habit. 3. Eat Well and Limit Alcohol and Stimulants Alcohol, nicotine and caffeine may temporarily relieve stress but have negative health impacts and can make stress worse in the long run. Well-nourished bodies cope better, so start with a good breakfast, add more organic fruits and vegetables for a well-balanced diet, avoid processed foods and sugar, try herbal tea and drink more water. 4. Connect with Supportive People Talking face to face with another person releases hormones that reduce stress. Lean on those good listeners in your life. 5. [place] Time Do you enjoy gardening, reading, listening to music or some other creative pursuit? Engage in activities that bring you pleasure and joy; research shows that reduces stress by almost half and lowers your heart rate, too. 6. Practice Meditation, Stress Reduction or Yoga Relaxation techniques activate a state of restfulness that counterbalances your body's fight-or-flight hormones. Even if this also means a 10-minute break  in a long day: listen to music, read, go for a walk in nature, do a hobby, take a bath or spend time with a friend. Also consider doing a mindfulness exercise or try a daily deep breathing or imagery practice. Deep Breathing Slow, calm and deep breathing can help you relax. Try these steps to focus on your breathing and repeat as needed. Find a comfortable position and close your eyes. Exhale and drop your shoulders. Breathe in through your nose; fill your lungs and then your belly. Think of relaxing your body, quieting your mind and becoming calm and peaceful. Breathe out slowly through your nose, relaxing your belly. Think of releasing tension, pain, worries or distress. Repeat steps three and four until you feel relaxed. Imagery  This involves using your mind to excite the senses -- sound, vision, smell, taste and feeling. This may help ease your stress. Begin by getting comfortable and then do some slow breathing. Imagine a place you love being at. It could be somewhere from your childhood, somewhere you vacationed or just a place in your imagination. Feel how it is to be in the place you're imagining. Pay attention to the sounds, air, colors, and who is there with you. This is a place where you feel cared for and loved. All is well. You are safe. Take in all the smells, sounds, tastes and feelings. As you do, feel your body being nourished and healed. Feel the calm that surrounds you. Breathe in all the good. Breathe out any discomfort or tension. 7. Sleep Enough If you get less than seven to eight hours of sleep, your body won't tolerate stress as well as it could. If stress keeps you up at night, address the cause, and add extra meditation into your day to make up for the lost z's. Try to get seven to nine hours of sleep each night. Make a regular bedtime schedule. Keep your room dark and cool. Try to avoid computers, TV, cell phones and tablets before bed. 8. Bond with Connections You  Enjoy Go out for a coffee with a friend, chat with a neighbor, call a family member, visit with a clergy member, or even hang out with your pet. Clinical studies show that spending even a short time with a companion animal can cut anxiety levels almost in half. 9. Take a Vacation Getting away from it all can reset your stress tolerance by increasing your mental and emotional outlook, which makes you a happier, more productive person upon return. Leave your cellphone and laptop at home! 10. See a Counselor, Coach or Therapist If negative thoughts overwhelm your ability to make positive changes, it's time to seek professional help. Make an appointment today--your health and life are worth it."    Patient Goals: follow up goal      Follow up:  Patient agrees to Care Plan and Follow-up.  Plan: The Managed Medicaid care management team will reach out to the patient again over the next 30 days.  Date/time of next scheduled Social Work care management/care coordination outreach:  11/09/21 at [place], [place], MSW, [place] Medicaid LCSW [email or web address] Phone: [phone]

## 2021-11-09 ENCOUNTER — Other Ambulatory Visit: Payer: Self-pay

## 2021-11-09 NOTE — Patient Outreach (Signed)
  Medicaid Managed Care   Unsuccessful Attempt Note   11/09/2021 Name: Angelica [name] MRN: [id number] DOB: 21-Nov-1960  Referred by: [facility] Reason for referral : No chief complaint on file.   An unsuccessful telephone outreach was attempted today. The patient was referred to the case management team for assistance with care management and care coordination.    Follow Up Plan: A HIPAA compliant phone message was left for the patient providing contact information and requesting a return call.  An email was sent as well.  [name], BSW, MSW, [employer] [facility] Managed Medicaid LCSW [email or web address] Phone: [phone]

## 2021-11-09 NOTE — Patient Instructions (Signed)
Angelica [name] ,   The [facility] Managed Care Team is available to provide assistance to you with your healthcare needs at no cost and as a benefit of your [facility] Health plan. I'm sorry I was unable to reach you today for our scheduled appointment. Our care guide will call you to reschedule our telephone appointment. Please call me at the number below. I am available to be of assistance to you regarding your healthcare needs. .   Thank you  [name], BSW, MSW, LCSW Managed Medicaid LCSW [email or web address] Phone: [phone]

## 2021-11-12 ENCOUNTER — Telehealth: Payer: Self-pay

## 2021-11-12 NOTE — Telephone Encounter (Signed)
..   Medicaid Managed Care   Unsuccessful Outreach Note  11/12/2021 Name: Angelica [name] MRN: [id number] DOB: 1961-01-01  Referred by: [facility] Reason for referral : High Risk Managed Medicaid (I called the patient today to reschedule her missed appt with the MM LCSW and to reschedule her appt with the MM RNCM that has to be moved due to a mandatory meeting for the [facility]. I left my name and number and a detailed message on her VM.)   A second unsuccessful telephone outreach was attempted today. The patient was referred to the case management team for assistance with care management and care coordination.   Follow Up Plan: The care management team will reach out to the patient again over the next 7 days.    [place]

## 2021-11-22 ENCOUNTER — Ambulatory Visit: Payer: Self-pay

## 2021-11-26 ENCOUNTER — Other Ambulatory Visit: Payer: Medicaid Other | Admitting: Licensed Clinical Social Worker

## 2021-11-26 NOTE — Patient Instructions (Signed)
Visit Information  Angelica [name] was given information about Medicaid Managed Care team care coordination services as a part of their [place] Medicaid benefit. Angelica [name] verbally consented to engagement with the [facility] Managed Care team.   If you are experiencing a medical emergency, please call 911 or report to your local emergency department or urgent care.   If you have a non-emergency medical problem during routine business hours, please contact your provider's office and ask to speak with a nurse.   For questions related to your [facility], please call: [phone] or visit the homepage here: [email or web address]  If you would like to schedule transportation through your [facility], please call the following number at least 2 days in advance of your appointment: [phone]   Rides for urgent appointments can also be made after hours by calling Member Services.  Call the [place] at [phone], at any time, 24 hours a day, 7 days a week. If you are in danger or need immediate medical attention call 911.  If you would like help to quit smoking, call 1-800-QUIT-NOW 2500731538) OR Espaol: 1-855-Djelo-Ya (0-569-794-8016) o para ms informacin haga clic aqu or Text READY to 200-400 to register via text  [name], [place], MSW, [employer] Managed Medicaid LCSW [name] Phone: [phone]

## 2021-11-26 NOTE — Patient Outreach (Signed)
Medicaid Managed Care Social Work Note  11/26/2021 Name:  Angelica [name] MRN:  [id number] DOB:  12-08-1960  Angelica [name] is an 61 y.o. year old female who is a primary patient of Patient, No Pcp Per.  The Medicaid Managed Care Coordination team was consulted for assistance with:  [place] and Resources  Ms. Cruzen was given information about Medicaid Managed Care Coordination team services today. [name] Patient agreed to services and verbal consent obtained.  Engaged with patient  for by telephone forfollow up visit in response to referral for case management and/or care coordination services.   Assessments/Interventions:  Review of past medical history, allergies, medications, health status, including review of consultants reports, laboratory and other test data, was performed as part of comprehensive evaluation and provision of chronic care management services.  SDOH: (Social Determinant of Health) assessments and interventions performed: SDOH Interventions    Flowsheet Row Patient Outreach Telephone from 10/18/2021 in [place] Patient Outreach Telephone from 09/21/2021 in [place] Patient Outreach Telephone from 09/07/2021 in [place] Coordination Office Visit from 06/15/2020 in [place] Treatment Planning from 11/03/2019 in [place] from 08/31/2019 in [place]  SDOH Interventions        Food Insecurity Interventions -- -- -- Intervention Not Indicated -- --  Housing Interventions -- -- -- Intervention Not Indicated -- --  Transportation Interventions -- -- Intervention Not Indicated Intervention Not Indicated -- --  Utilities Interventions Intervention Not Indicated -- -- -- -- --  Alcohol Usage Interventions Intervention Not Indicated (Score <7) -- -- -- --  --  Depression Interventions/Treatment  -- -- -- -- Currently on Treatment Referral to Psychiatry  Financial Strain Interventions -- Intervention Not Indicated -- -- -- --  Stress Interventions -- [employer], Provide Counseling -- -- -- --       Advanced Directives Status:  See Care Plan for related entries.  Care Plan                 No Known Allergies  Medications Reviewed Today     Reviewed by Gayla Medicus, RN (Registered Nurse) on 10/18/21 at 68  Med List Status: <None>   Medication Order Taking? Sig Documenting Provider Last Dose Status Informant  atorvastatin (LIPITOR) 40 MG tablet [id number]  TAKE ONE TABLET BY MOUTH EVERY DAY AT 6PM Iloabachie, Chioma E, NP  Expired 06/15/21 2359   clotrimazole (CLOTRIMAZOLE-7) 1 % vaginal cream [id number] No Place 1 Applicatorful vaginally at bedtime.  Patient not taking: Reported on 09/07/2021   [name], [place] Not Taking Active   [facility] 120 MG/ML SOAJ [id number] No Inject 182m (153m subcutaneously once every 30 days.  Patient not taking: Reported on 09/07/2021    Not Taking Active   hydrochlorothiazide (MICROZIDE) 12.5 MG capsule [id number] ONE CAPSULE BY MOUTH EVERY DAY Iloabachie, Chioma E, NP  Expired 06/15/21 2359   insulin glargine (LANTUS) 100 UNIT/ML Solostar Pen [id number] 43 Units into the skin at bedtime. Iloabachie, Chioma E, NP  Expired 06/20/21 2359   insulin glargine, 1 Unit Dial, (TOUJEO SOLOSTAR) 300 UNIT/ML Solostar Pen [id number] Inject 10 Units under the skin daily.  Patient not taking: Reported on 09/07/2021    Not Taking Active   metFORMIN (GLUCOPHAGE-XR) 500 MG 24 hr tablet [id number] TAKE 2 TABLETS(1,000MG TOTAL)  BY MOUTH TWO TIMES DAILY WITH BREAKFAST AND BEFORE SUPPER Iloabachie, Chioma E, NP Taking Expired 11/15/20 2359   miconazole (MONISTAT 7) 2 % vaginal cream [id number] No Place 1 applicatorful vaginally at bedtime.  Patient not taking: Reported on 09/07/2021    Angelica Asp E, NP Not Taking Active   misoprostol (CYTOTEC) 200 MCG tablet [id number] No Take 1 tablet (200 mcg total) by mouth once for 1 dose. Take one tablet the night before your surgery. Take the other tablet the morning of your surgery with a small sip of water.  Patient not taking: Reported on 09/07/2021    Not Taking Active   pantoprazole (PROTONIX) 40 MG tablet [id number] No Take 1 tablet (40 mg total) by mouth 2 (two) times daily as needed.  Patient not taking: Reported on 09/07/2021   Angelica Asp E, NP Not Taking Active   Rimegepant Sulfate 75 MG TBDP [id number] No Take 75 mg by mouth daily. As needed - do not exceed more than 1 dose in 24 hours  Patient not taking: Reported on 06/15/2020   [provider] Not Taking Active            Med Note Angelica [name]   Thu Jun 15, 2020 11:25 AM) Hasn't started yet  Semaglutide, 1 MG/DOSE, (OZEMPIC, 1 MG/DOSE,) 2 MG/1.5ML SOPN [id number] No Inject 1 mg under the skin every seven (7) days.  Patient not taking: Reported on 09/07/2021    Not Taking Active   Semaglutide,0.25 or 0.5MG/DOS, (OZEMPIC, 0.25 OR 0.5 MG/DOSE,) 2 MG/1.5ML SOPN [id number] No Inject 0.5 mg under the skin once a week.  Patient not taking: Reported on 09/07/2021    Not Taking Active             Patient Active Problem List   Diagnosis Date Noted   Depression 11/01/2020   Vaginal discharge 06/15/2020   Degenerative arthritis 12/14/2019   Hypercoagulable state ([place]) 10/07/2019   Abnormal laboratory test result 09/28/2019   Tooth loose 08/04/2019   Weakness of right leg 07/20/2019   Lacunar infarct, acute [facility])    Stroke ([place]) 06/14/2019   Headache 10/20/2018   Insomnia 10/20/2018   Anxiety 10/20/2018   Dysuria 10/20/2018   Encounter for screening colonoscopy    Residual hemorrhoidal skin tags    Internal hemorrhoids    Polyp of ascending colon    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   Healthcare maintenance 09/23/2018   Menopausal hot flushes 07/20/2015    GERD (gastroesophageal reflux disease) 01/27/2015   Hypertension 01/27/2015   Hyperlipidemia 01/27/2015   Bell palsy 08/04/2014   Diabetes mellitus without complication ([place]) 64/33/2951     Patient Care Plan: LCSW Plan of Care     Problem Identified: Depression Identification (Depression)      Long-Range Goal: Depressive Symptoms Identified   Start Date: 09/21/2021  Recent Progress: On track  Priority: High  Note:   Priority: High  Timeframe:  Long-Range Goal Priority:  High Start Date:   09/21/21               Expected End Date:  ongoing                    Follow Up Date--11/09/21 at 230 pm  - keep 90 percent of scheduled appointments -consider counseling or psychiatry -consider bumping up your self-care  -consider creating a stronger support network   Why is this important?             Beating depression may take some  time.            If you don't feel better right away, don't give up on your treatment plan.    Current barriers:   Chronic Mental Health needs related to depression, stress, grief and anxiety. Patient requires Support, Education, Resources, Referrals, Advocacy, and Care Coordination, in order to meet Unmet Mental Health Needs. Patient will implement clinical interventions discussed today to decrease symptoms of depression and increase knowledge and/or ability of: coping skills. Caregiver to family Mental Health Concerns and Social Isolation Patient lacks knowledge of available community counseling agencies and resources.  Clinical Goal(s): verbalize understanding of plan for management of Anxiety, Depression, and Stress and demonstrate a reduction in symptoms. Patient will connect with a provider for ongoing mental health treatment, increase coping skills, healthy habits, self-management skills, and stress reduction        Clinical Interventions:  Assessed patient's previous and current treatment, coping skills, support system and barriers to care. Patient  and spouse provided hx  Verbalization of feelings encouraged, motivational interviewing employed Emotional support provided, positive coping strategies explored. Establishing healthy boundaries emphasized and healthy self-care education provided Patient was educated on available mental health resources within their area that accept Medicaid and offer counseling and psychiatry. Patient reports only being interested in in person counseling. Patient agreed that she can commute to [place] location for [employer].  Patient reports significant worsening depression Email sent to patient with resource information. Patient unable to complete assessment questions with [facility] LCSW today due to caregiving for her sister during 09/21/21 visit.  Patient is agreeable to referral to [facility] for in person counseling. [facility] LCSW made referral on 09/21/21. Email sent to referral coordinator on 9/15 inquiring about status. Patients reports that she has not heard from [facility] yet. She reports she is unable to talk today because her significant other it transferring into a rehab facility today. Brief emotional support provided. [facility] LCSW will follow up within two weeks. UPDATE- Patient confirms that she received intake paperwork from [facility] but she did not receive a phone call. She reports that she will follow up on this next week as she is currently recovering from [place] and assisting with care taking for her boyfriend who recently was discharged from the hospital. Patient reports that her main focus now is getting herself and her boyfriend better. [facility] LCSW provided emotional support and reflective listening. Patient is agreeable to a 3 week follow up call which will allow her time to work on her intake paperwork for [facility] so that she can start therapy.   Motivational Interviewing employed Depression screen reviewed  PHQ2/ PHQ9 completed or reviewed  Mindfulness or Relaxation training  provided Active listening / Reflection utilized  Advance Care and HCPOA education provided Emotional Support Provided Problem [place] strategies reviewed Provided psychoeducation for mental health needs  Provided brief CBT  Reviewed mental health medications and discussed importance of compliance:  Quality of sleep assessed & Sleep Hygiene techniques promoted  Participation in counseling encouraged  Verbalization of feelings encouraged  Suicidal Ideation/Homicidal Ideation assessed: Patient denies SI/HI  Review resources, discussed options and provided patient information about  [place] care team collaboration (see longitudinal plan of care) Patient Goals/Self-Care Activities: Over the next 120 days Attend scheduled medical appointments Utilize healthy coping skills and supportive resources discussed Contact PCP with any questions or concerns Keep 90 percent of counseling appointments Call your insurance provider for more information about your Enhanced Benefits  Check out counseling resources provided  Begin personal counseling with LCSW, to reduce and manage symptoms of Depression and Stress, until well-established with mental health provider Accept all calls from representative with [facility] in an effort to establish ongoing mental health counseling and supportive services. Incorporate into daily practice - relaxation techniques, deep breathing exercises, and mindfulness meditation strategies. Talk about feelings with friends, family members, spiritual advisor, etc. Contact LCSW directly [phone]), if you have questions, need assistance, or if additional social work needs are identified between now and our next scheduled telephone outreach call. Call 988 for mental health hotline/crisis line if needed (24/7 available) Try techniques to reduce symptoms of anxiety/negative thinking (deep breathing, distraction, positive self  talk, etc)  - develop a personal safety plan - develop a plan to deal with triggers like holidays, anniversaries - exercise at least 2 to 3 times per week - have a plan for how to handle bad days - journal feelings and what helps to feel better or worse - spend time or talk with others at least 2 to 3 times per week - watch for early signs of feeling worse - begin personal counseling - call and visit an old friend - check out volunteer opportunities - join a support group - laugh; watch a funny movie or comedian - learn and use visualization or guided imagery - perform a random act of kindness - practice relaxation or meditation daily - start or continue a personal journal - practice positive thinking and self-talk -continue with compliance of taking medication  -identify current effective and ineffective coping strategies.  -implement positive self-talk in care to increase self-esteem, confidence and feelings of control.  -consider alternative and complementary therapy approaches such as meditation, mindfulness or yoga.  -journaling, prayer, worship services, meditation or pastoral counseling.  -increase participation in pleasurable group activities such as hobbies, singing, sports or volunteering).  -consider the use of meditative movement therapy such as tai chi, yoga or qigong.  -start a regular daily exercise program based on tolerance, ability and patient choice to support positive thinking and activity    If you are experiencing a Mental Health or [place] or need someone to talk to, please call the Suicide and Crisis Lifeline: 988   The following coping skill education was provided for stress relief and mental health management: "When your car dies or a deadline looms, how do you respond? Long-term, low-grade or acute stress takes a serious toll on your body and mind, so don't ignore feelings of constant tension. Stress is a natural part of life. However, too much  stress can harm our health, especially if it continues every day. This is chronic stress and can put you at risk for heart problems like heart disease and depression. Understand what's happening inside your body and learn simple coping skills to combat the negative impacts of everyday stressors.  Types of Stress There are two types of stress: Emotional - types of emotional stress are relationship problems, pressure at work, financial worries, experiencing discrimination or having a major life change. Physical - Examples of physical stress include being sick having pain, not sleeping well, recovery from an injury or having an alcohol and drug use disorder. Fight or Flight Sudden or ongoing stress activates your nervous system and floods your bloodstream with adrenaline and cortisol, two hormones that raise blood pressure, increase heart rate and spike blood sugar. These changes pitch your body into a fight or flight response. That enabled our ancestors to  outrun saber-toothed tigers, and it's helpful today for situations like dodging a car accident. But most modern chronic stressors, such as finances or a challenging relationship, keep your body in that heightened state, which hurts your health. Effects of Too Much Stress If constantly under stress, most of [place] will eventually start to function less well.  Multiple studies link chronic stress to a higher risk of heart disease, stroke, depression, weight gain, memory loss and even premature death, so it's important to recognize the warning signals. Talk to your doctor about ways to manage stress if you're experiencing any of these symptoms: Prolonged periods of poor sleep. Regular, severe headaches. Unexplained weight loss or gain. Feelings of isolation, withdrawal or worthlessness. Constant anger and irritability. Loss of interest in activities. Constant worrying or obsessive thinking. Excessive alcohol or drug use. Inability to concentrate.  10  Ways to Cope with Chronic Stress It's key to recognize stressful situations as they occur because it allows you to focus on managing how you react. We all need to know when to close our eyes and take a deep breath when we feel tension rising. Use these tips to prevent or reduce chronic stress. 1. Rebalance Work and Home All work and no play? If you're spending too much time at the office, intentionally put more dates in your calendar to enjoy time for fun, either alone or with others. 2. Get Regular Exercise Moving your body on a regular basis balances the nervous system and increases blood circulation, helping to flush out stress hormones. Even a daily 20-minute walk makes a difference. Any kind of exercise can lower stress and improve your mood ? just pick activities that you enjoy and make it a regular habit. 3. Eat Well and Limit Alcohol and Stimulants Alcohol, nicotine and caffeine may temporarily relieve stress but have negative health impacts and can make stress worse in the long run. Well-nourished bodies cope better, so start with a good breakfast, add more organic fruits and vegetables for a well-balanced diet, avoid processed foods and sugar, try herbal tea and drink more water. 4. Connect with Supportive People Talking face to face with another person releases hormones that reduce stress. Lean on those good listeners in your life. 5. [place] Time Do you enjoy gardening, reading, listening to music or some other creative pursuit? Engage in activities that bring you pleasure and joy; research shows that reduces stress by almost half and lowers your heart rate, too. 6. Practice Meditation, Stress Reduction or Yoga Relaxation techniques activate a state of restfulness that counterbalances your body's fight-or-flight hormones. Even if this also means a 10-minute break in a long day: listen to music, read, go for a walk in nature, do a hobby, take a bath or spend time with a friend. Also  consider doing a mindfulness exercise or try a daily deep breathing or imagery practice. Deep Breathing Slow, calm and deep breathing can help you relax. Try these steps to focus on your breathing and repeat as needed. Find a comfortable position and close your eyes. Exhale and drop your shoulders. Breathe in through your nose; fill your lungs and then your belly. Think of relaxing your body, quieting your mind and becoming calm and peaceful. Breathe out slowly through your nose, relaxing your belly. Think of releasing tension, pain, worries or distress. Repeat steps three and four until you feel relaxed. Imagery This involves using your mind to excite the senses -- sound, vision, smell, taste and feeling. This may help  ease your stress. Begin by getting comfortable and then do some slow breathing. Imagine a place you love being at. It could be somewhere from your childhood, somewhere you vacationed or just a place in your imagination. Feel how it is to be in the place you're imagining. Pay attention to the sounds, air, colors, and who is there with you. This is a place where you feel cared for and loved. All is well. You are safe. Take in all the smells, sounds, tastes and feelings. As you do, feel your body being nourished and healed. Feel the calm that surrounds you. Breathe in all the good. Breathe out any discomfort or tension. 7. Sleep Enough If you get less than seven to eight hours of sleep, your body won't tolerate stress as well as it could. If stress keeps you up at night, address the cause, and add extra meditation into your day to make up for the lost z's. Try to get seven to nine hours of sleep each night. Make a regular bedtime schedule. Keep your room dark and cool. Try to avoid computers, TV, cell phones and tablets before bed. 8. Bond with Connections You Enjoy Go out for a coffee with a friend, chat with a neighbor, call a family member, visit with a clergy member, or even hang  out with your pet. Clinical studies show that spending even a short time with a companion animal can cut anxiety levels almost in half. 9. Take a Vacation Getting away from it all can reset your stress tolerance by increasing your mental and emotional outlook, which makes you a happier, more productive person upon return. Leave your cellphone and laptop at home! 10. See a Counselor, Coach or Therapist If negative thoughts overwhelm your ability to make positive changes, it's time to seek professional help. Make an appointment today--your health and life are worth it."    Patient Goals: follow up goal      Patient reports that she has not had a chance to contact [facility] yet regarding her counseling referral and confirms that she does not need a follow up call because she has all of the information that she needs to obtain desired services. She was appreciative of social work services and support. [facility] LCSW will end goal and close referral at this time.  Follow up:  Patient requests no follow-up at this time.  Plan: The Managed Medicaid care management team is available to follow up with the patient after provider conversation with the patient regarding recommendation for care management engagement and subsequent re-referral to the care management team.   [name], BSW, MSW, LCSW Managed Medicaid LCSW [email or web address] Phone: [phone]

## 2022-03-09 ENCOUNTER — Emergency Department: Payer: Medicaid Other

## 2022-03-09 ENCOUNTER — Other Ambulatory Visit: Payer: Self-pay

## 2022-03-09 ENCOUNTER — Emergency Department
Admission: EM | Admit: 2022-03-09 | Discharge: 2022-03-09 | Disposition: A | Discharge status: Home / Self Care | Payer: Medicaid Other | Attending: Emergency Medicine | Admitting: Emergency Medicine

## 2022-03-09 DIAGNOSIS — J069 Acute upper respiratory infection, unspecified: Secondary | ICD-10-CM | POA: Diagnosis not present

## 2022-03-09 DIAGNOSIS — R059 Cough, unspecified: Secondary | ICD-10-CM | POA: Diagnosis present

## 2022-03-09 DIAGNOSIS — E119 Type 2 diabetes mellitus without complications: Secondary | ICD-10-CM | POA: Insufficient documentation

## 2022-03-09 DIAGNOSIS — I10 Essential (primary) hypertension: Secondary | ICD-10-CM | POA: Insufficient documentation

## 2022-03-09 DIAGNOSIS — Z20822 Contact with and (suspected) exposure to covid-19: Secondary | ICD-10-CM | POA: Diagnosis not present

## 2022-03-09 LAB — RESP PANEL BY RT-PCR (RSV, FLU A&B, COVID)  RVPGX2
Influenza A by PCR: NEGATIVE
Influenza B by PCR: NEGATIVE
Resp Syncytial Virus by PCR: NEGATIVE
SARS Coronavirus 2 by RT PCR: NEGATIVE

## 2022-03-09 MED ORDER — ACETAMINOPHEN 325 MG PO TABS
650.0000 mg | ORAL_TABLET | Freq: Once | ORAL | Status: AC
  Administered 2022-03-09: 650 mg via ORAL
  Filled 2022-03-09: qty 2

## 2022-03-09 NOTE — ED Triage Notes (Signed)
Pt reports Thursday started with generalized bodyaches, nasal congestion, cough and fevers. Pt denies exposures to anyone who has been sick. Pt reports she has been doing nothing but sleeping she feels so bad.

## 2022-03-09 NOTE — ED Notes (Signed)
Dimmed light and provided blanket. Pt noted to have unlabored respirations.

## 2022-03-09 NOTE — Discharge Instructions (Signed)
Your COVID, flu, RSV swab is negative.  You may continue to take Tylenol/ibuprofen per package instructions to help with your symptoms.  Please return for any new, worsening, or change in symptoms or other concerns.  It was a pleasure caring for you today.

## 2022-03-09 NOTE — ED Provider Notes (Signed)
[facility] Provider Note    Event Date/Time   First MD Initiated Contact with Patient 03/09/22 1014     (approximate)   History   Nasal Congestion, Generalized Body Aches, Cough, and Fever   HPI  Angelica [name] is a 62 y.o. female with a past medical history of anxiety, hypertension, hyperlipidemia, diabetes who presents today for evaluation of cough, body aches, nasal congestion.  She denies any known sick contacts.  She wants to be tested for flu and COVID given that she is taking care of her sister who has dementia and she does not want to get her sick.  No fevers or chills.  No chest pain or shortness of breath.  No abdominal pain, nausea, vomiting, diarrhea.  No recent travel.  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   Healthcare maintenance 09/23/2018   Menopausal hot flushes 07/20/2015   GERD (gastroesophageal reflux disease) 01/27/2015   Hypertension 01/27/2015   Hyperlipidemia 01/27/2015   Bell palsy 08/04/2014   Diabetes mellitus without complication ([place]) [id number]          Physical Exam   Triage Vital Signs: ED Triage Vitals  Enc Vitals Group     BP 03/09/22 1001 130/89     Pulse Rate 03/09/22 1001 (!) 104     Resp 03/09/22 1001 17     Temp 03/09/22 1001 98.1 F (36.7 C)     Temp Source 03/09/22 1001 Oral     SpO2 03/09/22 1001 97 %     Weight 03/09/22 0953 185 lb 3 oz (84 kg)      Height 03/09/22 0953 5' 3"$  (1.6 m)     Head Circumference --      Peak Flow --      Pain Score 03/09/22 0953 8     Pain Loc --      Pain Edu? --      Excl. in [place]? --     Most recent vital signs: Vitals:   03/09/22 1001  BP: 130/89  Pulse: (!) 104  Resp: 17  Temp: 98.1 F (36.7 C)  SpO2: 97%    Physical Exam Vitals and nursing note reviewed.  Constitutional:      General: Awake and alert. No acute distress.    Appearance: Normal appearance. The patient is normal weight.  HENT:     Head: Normocephalic and atraumatic.     Mouth: Mucous membranes are moist. Uvula midline.  No tonsillar exudate.  No soft palate fluctuance.  No trismus.  No voice change.  No sublingual swelling.  No tender cervical lymphadenopathy.  No nuchal rigidity Significant nasal congestion present Eyes:     General: PERRL. Normal EOMs        Right eye: No discharge.        Left eye: No discharge.     Conjunctiva/sclera: Conjunctivae normal.  Cardiovascular:     Rate and  Rhythm: Normal rate and regular rhythm.     Pulses: Normal pulses.  Pulmonary:     Effort: Pulmonary effort is normal. No respiratory distress.  Able to speak easily in complete sentences    Breath sounds: Normal breath sounds.  Abdominal:     Abdomen is soft. There is no abdominal tenderness. No rebound or guarding. No distention. Musculoskeletal:        General: No swelling. Normal range of motion.     Cervical back: Normal range of motion and neck supple.  No lymphadenopathy Skin:    General: Skin is warm and dry.     Capillary Refill: Capillary refill takes less than 2 seconds.     Findings: No rash.  Neurological:     Mental Status: The patient is awake and alert.      ED Results / Procedures / Treatments   Labs (all labs ordered are listed, but only abnormal results are displayed) Labs Reviewed  RESP PANEL BY RT-PCR (RSV, FLU A&B, COVID)  RVPGX2     EKG     RADIOLOGY I independently reviewed and interpreted  imaging and agree with radiologists findings.     PROCEDURES:  Critical Care performed:   Procedures   MEDICATIONS ORDERED IN ED: Medications  acetaminophen (TYLENOL) tablet 650 mg (650 mg Oral Given 03/09/22 1128)     IMPRESSION / MDM / ASSESSMENT AND PLAN / ED COURSE  I reviewed the triage vital signs and the nursing notes.   Differential diagnosis includes, but is not limited to, COVID, influenza, RSV, other URI, bronchitis, pneumonia.  Patient is awake and alert, hemodynamically stable and afebrile.  She is nontoxic in appearance.  She is copious nasal congestion and rhinorrhea, and her symptoms of nasal congestion, cough, sore throat are suggestive of URI.  Her COVID/flu/RSV swab is negative.  No tonsillar exudate, no fever, no lymphadenopathy, and presence of cough makes strep unlikely.  She is able to speak easily in complete sentences and her lungs are clear to auscultation bilaterally, x-ray was obtained and is negative for any acute cardiopulmonary abnormality, not consistent with pneumonia.  We discussed symptomatic management and return precautions.  Advised that patient likely has a viral infection and is likely contagious, so advised that she wear a mask and wash her hands prior to treating her sister.  Patient understands and agrees with plan.  She was discharged in stable condition.   Patient's presentation is most consistent with acute complicated illness / injury requiring diagnostic workup.      FINAL CLINICAL IMPRESSION(S) / ED DIAGNOSES   Final diagnoses:  Upper respiratory tract infection, unspecified type     Rx / DC Orders   ED Discharge Orders     None        Note:  This document was prepared using Dragon voice recognition software and may include unintentional dictation errors.   Marquette Old, PA-C 03/09/22 1318    Nathaniel Man, MD 03/10/22 [phone]

## 2022-03-09 NOTE — ED Notes (Signed)
See triage note. Flulike symptoms, pain, fever. Pt is afebrile at this time.

## 2022-09-15 ENCOUNTER — Emergency Department
Admission: EM | Admit: 2022-09-15 | Discharge: 2022-09-15 | Disposition: A | Discharge status: Home / Self Care | Payer: Medicaid Other | Attending: Emergency Medicine | Admitting: Emergency Medicine

## 2022-09-15 ENCOUNTER — Other Ambulatory Visit: Payer: Self-pay

## 2022-09-15 DIAGNOSIS — R509 Fever, unspecified: Secondary | ICD-10-CM | POA: Diagnosis present

## 2022-09-15 DIAGNOSIS — U071 COVID-19: Secondary | ICD-10-CM | POA: Diagnosis not present

## 2022-09-15 LAB — COMPREHENSIVE METABOLIC PANEL
ALT: 27 U/L (ref 0–44)
AST: 18 U/L (ref 15–41)
Albumin: 3.9 g/dL (ref 3.5–5.0)
Alkaline Phosphatase: 109 U/L (ref 38–126)
Anion gap: 8 (ref 5–15)
BUN: 12 mg/dL (ref 6–20)
CO2: 25 mmol/L (ref 22–32)
Calcium: 8.9 mg/dL (ref 8.9–10.3)
Chloride: 102 mmol/L (ref 98–111)
Creatinine, Ser: 0.74 mg/dL (ref 0.44–1.00)
GFR, Estimated: >60 mL/min (ref >60)
Glucose, Bld: 155 mg/dL — ABNORMAL HIGH (ref 70–99)
Potassium: 3.4 mmol/L — ABNORMAL LOW (ref 3.5–5.1)
Sodium: 135 mmol/L (ref 135–145)
Total Bilirubin: 0.8 mg/dL (ref 0.3–1.2)
Total Protein: 8.1 g/dL (ref 6.5–8.1)

## 2022-09-15 LAB — RESP PANEL BY RT-PCR (RSV, FLU A&B, COVID)  RVPGX2
Influenza A by PCR: NEGATIVE
Influenza B by PCR: NEGATIVE
Resp Syncytial Virus by PCR: NEGATIVE
SARS Coronavirus 2 by RT PCR: POSITIVE — AB

## 2022-09-15 LAB — CBC WITH DIFFERENTIAL/PLATELET
Abs Immature Granulocytes: 0.03 10*3/uL (ref 0.00–0.07)
Basophils Absolute: 0 10*3/uL (ref 0.0–0.1)
Basophils Relative: 0 %
Eosinophils Absolute: 0.1 10*3/uL (ref 0.0–0.5)
Eosinophils Relative: 1 %
HCT: 40.4 % (ref 36.0–46.0)
Hemoglobin: 13.2 g/dL (ref 12.0–15.0)
Immature Granulocytes: 0 %
Lymphocytes Relative: 15 %
Lymphs Abs: 1.4 10*3/uL (ref 0.7–4.0)
MCH: 26.6 pg (ref 26.0–34.0)
MCHC: 32.7 g/dL (ref 30.0–36.0)
MCV: 81.3 fL (ref 80.0–100.0)
Monocytes Absolute: 0.5 10*3/uL (ref 0.1–1.0)
Monocytes Relative: 6 %
Neutro Abs: 7.3 10*3/uL (ref 1.7–7.7)
Neutrophils Relative %: 78 %
Platelets: 301 10*3/uL (ref 150–400)
RBC: 4.97 MIL/uL (ref 3.87–5.11)
RDW: 14.2 % (ref 11.5–15.5)
WBC: 9.3 10*3/uL (ref 4.0–10.5)
nRBC: 0 % (ref 0.0–0.2)

## 2022-09-15 LAB — GROUP A STREP BY PCR: Group A Strep by PCR: NOT DETECTED

## 2022-09-15 LAB — LIPASE, BLOOD: Lipase: 37 U/L (ref 11–51)

## 2022-09-15 MED ORDER — ONDANSETRON 4 MG PO TBDP
4.0000 mg | ORAL_TABLET | Freq: Three times a day (TID) | ORAL | 0 refills | Status: AC | PRN
Start: 2022-09-15 — End: ?

## 2022-09-15 MED ORDER — BENZONATATE 100 MG PO CAPS: 100.0000 mg | ORAL_CAPSULE | Freq: Three times a day (TID) | ORAL | 0 refills | Status: AC | PRN

## 2022-09-15 MED ORDER — EXCEDRIN MIGRAINE 250-250-65 MG PO TABS: 1.0000 | ORAL_TABLET | Freq: Four times a day (QID) | ORAL | 0 refills | Status: AC | PRN

## 2022-09-15 MED ORDER — SODIUM CHLORIDE 0.9 % IV BOLUS
1000.0000 mL | Freq: Once | INTRAVENOUS | Status: AC
  Administered 2022-09-15: 1000 mL via INTRAVENOUS

## 2022-09-15 MED ORDER — ONDANSETRON HCL 4 MG/2ML IJ SOLN
4.0000 mg | Freq: Once | INTRAMUSCULAR | Status: AC
  Administered 2022-09-15: 4 mg via INTRAVENOUS
  Filled 2022-09-15: qty 2

## 2022-09-15 NOTE — ED Provider Notes (Signed)
[facility] Provider Note  Patient Contact: 8:54 PM (approximate)   History   Fever and Sore Throat   HPI  Angelica [name] is a 62 y.o. female who presents to the emergency department for several days of congestion, cough, sore throat, headaches, body aches.  Patient states that it she has had some low-grade fevers as well.  No difficulty breathing, chest pain, abdominal pain.  No diarrhea or constipation.  She has had some emesis.  Patient believes that she may have been exposed to COVID but she is unsure at this time.     Physical Exam   Triage Vital Signs: ED Triage Vitals  Encounter Vitals Group     BP 09/15/22 1915 (!) 142/87     Systolic BP Percentile --      Diastolic BP Percentile --      Pulse Rate 09/15/22 1915 (!) 110     Resp 09/15/22 1915 18     Temp 09/15/22 1915 98.6 F (37 C)     Temp src --      SpO2 09/15/22 1915 98 %     Weight 09/15/22 1914 189 lb (85.7 kg)     Height 09/15/22 1914 5\' 8"  (1.727 m)     Head Circumference --      Peak Flow --      Pain Score 09/15/22 1914 8     Pain Loc --      Pain Education --      Exclude from Growth Chart --     Most recent vital signs: Vitals:   09/15/22 1915  BP: (!) 142/87  Pulse: (!) 110  Resp: 18  Temp: 98.6 F (37 C)  SpO2: 98%     General: Alert and in no acute distress. ENT:      Ears:       Nose: No congestion/rhinnorhea.      Mouth/Throat: Mucous membranes are moist. Neck: No stridor. No cervical spine tenderness to palpation.  Cardiovascular:  Good peripheral perfusion Respiratory: Normal respiratory effort without tachypnea or retractions. Lungs CTAB. Good air entry to the bases with no decreased or absent breath sounds Gastrointestinal: Bowel sounds 4 quadrants. Soft and nontender to palpation. No guarding or rigidity. No palpable masses. No distention.  Musculoskeletal: Full range of motion to all extremities.  Neurologic:  No gross focal neurologic deficits  are appreciated.  Skin:   No rash noted Other:   ED Results / Procedures / Treatments   Labs (all labs ordered are listed, but only abnormal results are displayed) Labs Reviewed  RESP PANEL BY RT-PCR (RSV, FLU A&B, COVID)  RVPGX2 - Abnormal; Notable for the following components:      Result Value   SARS Coronavirus 2 by RT PCR POSITIVE (*)    All other components within normal limits  COMPREHENSIVE METABOLIC PANEL - Abnormal; Notable for the following components:   Potassium 3.4 (*)    Glucose, Bld 155 (*)    All other components within normal limits  GROUP A STREP BY PCR  CBC WITH DIFFERENTIAL/PLATELET  LIPASE, BLOOD     EKG     RADIOLOGY    No results found.  PROCEDURES:  Critical Care performed: No  Procedures   MEDICATIONS ORDERED IN ED: Medications  sodium chloride 0.9 % bolus 1,000 mL (1,000 mLs Intravenous New Bag/Given 09/15/22 2225)  ondansetron (ZOFRAN) injection 4 mg (4 mg Intravenous Given 09/15/22 2225)     IMPRESSION / MDM / ASSESSMENT  AND PLAN / ED COURSE  I reviewed the triage vital signs and the nursing notes.                                 Differential diagnosis includes, but is not limited to, COVID flu, viral illness, bronchitis, pneumonia   Patient's presentation is most consistent with acute presentation with potential threat to life or bodily function.   Patient's diagnosis is consistent with COVID.  Patient presents emergency department with COVID-like symptoms.  Patient has multiple URI symptoms.  Given the prevalence in the community I was most suspicious for COVID and she was positive on testing.  Patient felt like she was not drinking enough and though there was no clinical signs of dehydration fluids were provided.  Labs are otherwise reassuring.  At this time patient is stable for discharge..  Symptom control medication prescribed, follow-up primary care as needed.  Patient is given ED precautions to return to the ED for any  worsening or new symptoms.     FINAL CLINICAL IMPRESSION(S) / ED DIAGNOSES   Final diagnoses:  COVID-19     Rx / DC Orders   ED Discharge Orders          Ordered    benzonatate (TESSALON PERLES) 100 MG capsule  3 times daily PRN        09/15/22 2249    ondansetron (ZOFRAN-ODT) 4 MG disintegrating tablet  Every 8 hours PRN        09/15/22 2249    aspirin-acetaminophen-caffeine (EXCEDRIN MIGRAINE) 250-250-65 MG tablet  Every 6 hours PRN        09/15/22 2249             Note:  This document was prepared using Dragon voice recognition software and may include unintentional dictation errors.   Lanette Hampshire 09/15/22 2253    Jene Every, MD 09/18/22 [phone]

## 2022-09-15 NOTE — ED Triage Notes (Addendum)
Pt c/o fever, sore throat, back pain and nausea since yesterday.

## 2022-10-29 ENCOUNTER — Other Ambulatory Visit: Payer: Self-pay | Admitting: Internal Medicine

## 2022-10-29 DIAGNOSIS — Z1231 Encounter for screening mammogram for malignant neoplasm of breast: Secondary | ICD-10-CM

## 2022-11-07 ENCOUNTER — Ambulatory Visit
Admission: RE | Admit: 2022-11-07 | Discharge: 2022-11-07 | Disposition: A | Discharge status: Home / Self Care | Payer: Medicaid Other | Source: Ambulatory Visit | Attending: Internal Medicine | Admitting: Internal Medicine

## 2022-11-07 DIAGNOSIS — Z1231 Encounter for screening mammogram for malignant neoplasm of breast: Secondary | ICD-10-CM | POA: Insufficient documentation

## 2023-03-20 ENCOUNTER — Other Ambulatory Visit (HOSPITAL_BASED_OUTPATIENT_CLINIC_OR_DEPARTMENT_OTHER): Payer: Self-pay

## 2023-06-23 ENCOUNTER — Other Ambulatory Visit: Payer: Self-pay

## 2023-06-23 ENCOUNTER — Emergency Department
Admission: EM | Admit: 2023-06-23 | Discharge: 2023-06-23 | Disposition: A | Discharge status: Home / Self Care | Attending: Emergency Medicine | Admitting: Emergency Medicine

## 2023-06-23 DIAGNOSIS — R519 Headache, unspecified: Secondary | ICD-10-CM | POA: Diagnosis present

## 2023-06-23 DIAGNOSIS — J029 Acute pharyngitis, unspecified: Secondary | ICD-10-CM | POA: Diagnosis not present

## 2023-06-23 LAB — RESP PANEL BY RT-PCR (RSV, FLU A&B, COVID)  RVPGX2
Influenza A by PCR: NEGATIVE
Influenza B by PCR: NEGATIVE
Resp Syncytial Virus by PCR: NEGATIVE
SARS Coronavirus 2 by RT PCR: NEGATIVE

## 2023-06-23 LAB — GROUP A STREP BY PCR: Group A Strep by PCR: NOT DETECTED

## 2023-06-23 NOTE — ED Triage Notes (Signed)
 Pt to ED via POV from home. Pt reports sore throat, nausea, HA and difficulty swallowing that started yesterday.

## 2023-06-23 NOTE — Discharge Instructions (Addendum)
 You were seen in the emergency department and diagnosed with viral pharyngitis. Ensure adequate hydration and rest. Rest your voice. Use salt water gargles and/or lozenges (ex. hard candy) as soothing remedies. Alternate ibuprofen  and/or tylenol  for fever and/or pain relief.   Please return to the emergency dept if you have any difficulty breathing, fever not decreasing with Tylenol  and ibuprofen  use, chest pain, abdominal pain, unable to intake food or water, or any other new or concerning symptom.

## 2023-06-23 NOTE — ED Provider Notes (Signed)
 [facility] Provider Note    Event Date/Time   First MD Initiated Contact with Patient 06/23/23 1818     (approximate)   History   Sore Throat   HPI Angelica [name] is a 63 y.o. female presenting to the emergency department with sore throat, headache, nausea and chills since last night.  Denies chest pain, abdominal pain, difficulty swallowing, vomiting, diarrhea, fever, shortness of breath, dyspnea. No recent travel.  Denies sick contacts.  States she is up-to-date on all her vaccines.     Physical Exam   Triage Vital Signs: ED Triage Vitals [06/23/23 1740]  Encounter Vitals Group     BP (!) 150/98     Systolic BP Percentile      Diastolic BP Percentile      Pulse Rate 86     Resp 20     Temp 98.4 F (36.9 C)     Temp Source Oral     SpO2 98 %     Weight      Height      Head Circumference      Peak Flow      Pain Score 7     Pain Loc      Pain Education      Exclude from Growth Chart     Most recent vital signs: Vitals:   06/23/23 1740  BP: (!) 150/98  Pulse: 86  Resp: 20  Temp: 98.4 F (36.9 C)  SpO2: 98%   General: Well-appearing, in no acute distress. Appears stated age. Head: Normocephalic, atraumatic. Eyes: PERRLA. EOMs intact. No scleral icterus or conjunctival injection. Ears/Nose/Throat: TMs intact b/l. Nares patent, no nasal discharge. Oropharynx moist, no exudate, but is erythematous bilaterally. No fluctuance noted.  Neck: Supple, no lymphadenopathy, no JVD, no nuchal rigidity. CV: Regular rate. No murmurs, rubs, or gallops. Peripheral pulses 2+ and symmetric. No edema. Respiratory: Breath sounds clear b/l. No wheezes, rales, or rhonchi. No respiratory distress. Normal respiratory effort. GI: Soft, non-distended. Skin:Warm, dry, intact. No rashes, lesions, or ecchymosis. No cyanosis or pallor.   ED Results / Procedures / Treatments   Labs (all labs ordered are listed, but only abnormal results are  displayed) Labs Reviewed  GROUP A STREP BY PCR  RESP PANEL BY RT-PCR (RSV, FLU A&B, COVID)  RVPGX2     EKG     RADIOLOGY   PROCEDURES:  Critical Care performed: No  Procedures   MEDICATIONS ORDERED IN ED: Medications - No data to display   IMPRESSION / MDM / ASSESSMENT AND PLAN / ED COURSE  I reviewed the triage vital signs and the nursing notes.                              Differential diagnosis includes, but is not limited to, viral pharyngitis, strep pharyngitis, mono, peritonsillar abscess, viral URI  Patient's presentation is most consistent with acute complicated illness / injury requiring diagnostic workup.  Patient's presentation is most consistent with viral pharyngitis.  Strep PCR ordered and negative.  Respiratory panel ordered and negative for COVID, flu, RSV.  Discussed with patient how I could do blood work to test for mononucleosis, but this does not change my treatment plan.  Physical exam shows no evidence of abscess with fluctuance.  Instructions given on care for viral pharyngitis in discharge paperwork including over-the-counter remedies.  Emergency department return precautions were discussed with the patient.  Patient is in  agreement to the treatment plan.  Patient is stable for discharge.      FINAL CLINICAL IMPRESSION(S) / ED DIAGNOSES   Final diagnoses:  Viral pharyngitis     Rx / DC Orders   ED Discharge Orders     None        Note:  This document was prepared using Dragon voice recognition software and may include unintentional dictation errors.    Thomasenia Flesher, PA-C 06/23/23 Gerry Krone    Claria Crofts, MD 06/23/23 2322

## 2023-09-14 ENCOUNTER — Emergency Department
Admission: EM | Admit: 2023-09-14 | Discharge: 2023-09-14 | Disposition: A | Discharge status: Home / Self Care | Attending: Emergency Medicine | Admitting: Emergency Medicine

## 2023-09-14 DIAGNOSIS — M542 Cervicalgia: Secondary | ICD-10-CM | POA: Diagnosis present

## 2023-09-14 DIAGNOSIS — M79652 Pain in left thigh: Secondary | ICD-10-CM | POA: Diagnosis not present

## 2023-09-14 DIAGNOSIS — M5412 Radiculopathy, cervical region: Secondary | ICD-10-CM | POA: Insufficient documentation

## 2023-09-14 DIAGNOSIS — T148XXA Other injury of unspecified body region, initial encounter: Secondary | ICD-10-CM

## 2023-09-14 MED ORDER — LIDOCAINE 5 % EX PTCH
1.0000 | MEDICATED_PATCH | CUTANEOUS | Status: DC
  Administered 2023-09-14: 1 via TRANSDERMAL
  Filled 2023-09-14: qty 1

## 2023-09-14 MED ORDER — PREDNISONE 20 MG PO TABS
60.0000 mg | ORAL_TABLET | Freq: Once | ORAL | Status: AC
  Administered 2023-09-14: 60 mg via ORAL
  Filled 2023-09-14: qty 3

## 2023-09-14 MED ORDER — PREDNISONE 10 MG (21) PO TBPK: ORAL_TABLET | ORAL | 0 refills | Status: AC

## 2023-09-14 MED ORDER — LIDOCAINE 5 % EX PTCH: 1.0000 | MEDICATED_PATCH | CUTANEOUS | 0 refills | Status: AC

## 2023-09-14 NOTE — ED Provider Notes (Cosign Needed Addendum)
 [facility] Provider Note    Event Date/Time   First MD Initiated Contact with Patient 09/14/23 1918     (approximate)   History   Neck Pain   HPI  Angelica [name] is a 63 y.o. female presenting with left-sided neck pain that started 3 days ago as well as left lateral thigh pain.  Patient states her neck pain is described as achy and shooting down into the radial side of her left arm and hand.  She denies fall or injury, weakness, numbness, fever, chills, back pain, vision changes, chest pain, SOB, calf pain.  Pain is worse with movement.  Patient states left lateral thigh pain as well, worse with movement. Has tried Tylenol  and Ibuprofen  at home with minimal relief. No hx of prior cervical surgeries or trauma.    Physical Exam   Triage Vital Signs: ED Triage Vitals  Encounter Vitals Group     BP 09/14/23 1726 (!) 148/87     Girls Systolic BP Percentile --      Girls Diastolic BP Percentile --      Boys Systolic BP Percentile --      Boys Diastolic BP Percentile --      Pulse Rate 09/14/23 1726 (!) 114     Resp 09/14/23 1726 18     Temp 09/14/23 1726 99.4 F (37.4 C)     Temp Source 09/14/23 1726 Oral     SpO2 09/14/23 1726 98 %     Weight 09/14/23 1727 182 lb (82.6 kg)     Height 09/14/23 1727 5' 3 (1.6 m)     Head Circumference --      Peak Flow --      Pain Score 09/14/23 1727 9     Pain Loc --      Pain Education --      Exclude from Growth Chart --     Most recent vital signs: Vitals:   09/14/23 1726 09/14/23 2030  BP: (!) 148/87 (!) 143/80  Pulse: (!) 114 100  Resp: 18 18  Temp: 99.4 F (37.4 C) 99.4 F (37.4 C)  SpO2: 98% 100%    General: Awake, no distress.  CV:  Good peripheral perfusion.  Resp:  Normal effort. CTAB. Abd:  No distention.  Other:  No cervical midline tenderness. Able to perform all neck and shoulder motions with ease. Positive Spurling maneuver on the left. Neurovascularly intact to b/l upper and lower  extremities. TTP along left lateral thigh. Normal ROM of left hip and knee.    ED Results / Procedures / Treatments   Labs (all labs ordered are listed, but only abnormal results are displayed) Labs Reviewed - No data to display   EKG     RADIOLOGY    PROCEDURES:  Critical Care performed: No  Procedures   MEDICATIONS ORDERED IN ED: Medications  lidocaine  (LIDODERM ) 5 % 1 patch (1 patch Transdermal Patch Applied 09/14/23 1954)  predniSONE  (DELTASONE ) tablet 60 mg (60 mg Oral Given 09/14/23 1954)     IMPRESSION / MDM / ASSESSMENT AND PLAN / ED COURSE  I reviewed the triage vital signs and the nursing notes.                              Differential diagnosis includes, but is not limited to, cervical radiculopathy, musculoskeletal strain, torticollis, rotator cuff injury, subacromial bursitis, IT band syndrome  Patient's presentation is most consistent  with acute, uncomplicated illness.  Patient is a 64 year old female presenting with 3 days of left-sided neck pain going down into her arm in a sharp shooting like manner.  Patient has not had any fall or injury.  Positive Spurling sign.  Patient also has tenderness along her left lateral thigh, worse with palpation.  No shortness of breath or chest pain, no discoloration of the leg or swelling, no pain in anterior or medial thigh or calf. SpO2 100%.  Discussed will use prednisone  taper for neck symptoms as well as lidocaine  patches for her leg.  She can also continue Tylenol  use at home.  Discussed returning to the emergency department if she develops worsening leg pain especially if going down into her calf, any shortness of breath or chest pain or change in leg color or swelling.  Would like her to follow-up with her primary care provider regarding today's visit.  The patient may return to the emergency department for any new, worsening, or concerning symptoms. Patient was given the opportunity to ask questions; all questions  were answered. Emergency department return precautions were discussed with the patient.  Patient is in agreement to the treatment plan.  Patient is stable for discharge.     FINAL CLINICAL IMPRESSION(S) / ED DIAGNOSES   Final diagnoses:  Cervical radiculopathy  Musculoskeletal strain     Rx / DC Orders   ED Discharge Orders          Ordered    lidocaine  (LIDODERM ) 5 %  Every 24 hours        09/14/23 2032    predniSONE  (STERAPRED UNI-PAK 21 TAB) 10 MG (21) TBPK tablet        09/14/23 2032             Note:  This document was prepared using Dragon voice recognition software and may include unintentional dictation errors.    [name], PA-C 09/14/23 2045    [name], PA-C 09/14/23 2046    Arlander Charleston, MD 09/16/23 [phone]

## 2023-09-14 NOTE — ED Triage Notes (Signed)
 Pt presents to the ED via pov from home for left sided neck pain that started Thursday. Reports that the pain worsens with palpation and radiates down her left arm and down left back. Pt reports pain worsens with movement. Denies injury or trauma.

## 2023-09-14 NOTE — Discharge Instructions (Addendum)
 Please read through the information pertaining to a likely pinched nerve in your neck that is sending pain down your arm.  Please pick up and take the medication as prescribed.  I have also sent in lidocaine  patches for you to use on your tender area on your thigh.  You may continue Tylenol  use at home, but do not use ibuprofen  while taking the prednisone .  Please follow-up with your primary care provider as needed.  You may return to the emergency department for any new, worsening, or concerning symptoms.

## 2023-10-06 ENCOUNTER — Other Ambulatory Visit: Payer: Self-pay | Admitting: Internal Medicine

## 2023-10-06 DIAGNOSIS — Z1231 Encounter for screening mammogram for malignant neoplasm of breast: Secondary | ICD-10-CM

## 2023-11-10 ENCOUNTER — Encounter

## 2023-12-02 ENCOUNTER — Ambulatory Visit
Admission: RE | Admit: 2023-12-02 | Discharge: 2023-12-02 | Disposition: A | Discharge status: Home / Self Care | Source: Ambulatory Visit | Attending: Internal Medicine | Admitting: Internal Medicine

## 2023-12-02 DIAGNOSIS — Z1231 Encounter for screening mammogram for malignant neoplasm of breast: Secondary | ICD-10-CM | POA: Insufficient documentation
